# Patient Record
Sex: Female | Born: 1975 | Race: Black or African American | Hispanic: No | Marital: Single | Smoking: Never smoker
Health system: Southern US, Community
[De-identification: ages and names within clinical notes are randomized; demographics above are authoritative.]

## PROBLEM LIST (undated history)

## (undated) DIAGNOSIS — K219 Gastro-esophageal reflux disease without esophagitis: Secondary | ICD-10-CM

## (undated) DIAGNOSIS — Z801 Family history of malignant neoplasm of trachea, bronchus and lung: Secondary | ICD-10-CM

## (undated) DIAGNOSIS — Z9221 Personal history of antineoplastic chemotherapy: Secondary | ICD-10-CM

## (undated) DIAGNOSIS — D649 Anemia, unspecified: Secondary | ICD-10-CM

## (undated) DIAGNOSIS — M1990 Unspecified osteoarthritis, unspecified site: Secondary | ICD-10-CM

## (undated) DIAGNOSIS — R109 Unspecified abdominal pain: Secondary | ICD-10-CM

## (undated) DIAGNOSIS — IMO0001 Reserved for inherently not codable concepts without codable children: Secondary | ICD-10-CM

## (undated) DIAGNOSIS — Z8049 Family history of malignant neoplasm of other genital organs: Secondary | ICD-10-CM

## (undated) DIAGNOSIS — C801 Malignant (primary) neoplasm, unspecified: Secondary | ICD-10-CM

## (undated) DIAGNOSIS — E669 Obesity, unspecified: Secondary | ICD-10-CM

## (undated) DIAGNOSIS — Z87442 Personal history of urinary calculi: Secondary | ICD-10-CM

## (undated) DIAGNOSIS — N200 Calculus of kidney: Secondary | ICD-10-CM

## (undated) DIAGNOSIS — Z923 Personal history of irradiation: Secondary | ICD-10-CM

## (undated) HISTORY — PX: TUBAL LIGATION: SHX77

## (undated) HISTORY — DX: Reserved for inherently not codable concepts without codable children: IMO0001

## (undated) HISTORY — DX: Family history of malignant neoplasm of trachea, bronchus and lung: Z80.1

## (undated) HISTORY — DX: Unspecified abdominal pain: R10.9

## (undated) HISTORY — DX: Family history of malignant neoplasm of other genital organs: Z80.49

## (undated) HISTORY — DX: Obesity, unspecified: E66.9

## (undated) HISTORY — PX: LITHOTRIPSY: SUR834

## (undated) HISTORY — PX: DILATION AND CURETTAGE OF UTERUS: SHX78

## (undated) HISTORY — PX: CHOLECYSTECTOMY: SHX55

---

## 1998-07-06 ENCOUNTER — Emergency Department (HOSPITAL_COMMUNITY): Admission: EM | Admit: 1998-07-06 | Discharge: 1998-07-06 | Payer: Self-pay | Admitting: Emergency Medicine

## 1999-05-04 ENCOUNTER — Emergency Department (HOSPITAL_COMMUNITY): Admission: EM | Admit: 1999-05-04 | Discharge: 1999-05-04 | Payer: Self-pay | Admitting: Emergency Medicine

## 1999-11-05 ENCOUNTER — Emergency Department (HOSPITAL_COMMUNITY): Admission: EM | Admit: 1999-11-05 | Discharge: 1999-11-05 | Payer: Self-pay | Admitting: Emergency Medicine

## 1999-12-26 ENCOUNTER — Emergency Department (HOSPITAL_COMMUNITY): Admission: EM | Admit: 1999-12-26 | Discharge: 1999-12-26 | Payer: Self-pay | Admitting: Emergency Medicine

## 2000-10-27 ENCOUNTER — Encounter: Payer: Self-pay | Admitting: Emergency Medicine

## 2000-10-27 ENCOUNTER — Emergency Department (HOSPITAL_COMMUNITY): Admission: EM | Admit: 2000-10-27 | Discharge: 2000-10-27 | Payer: Self-pay | Admitting: Emergency Medicine

## 2001-04-12 ENCOUNTER — Emergency Department (HOSPITAL_COMMUNITY): Admission: EM | Admit: 2001-04-12 | Discharge: 2001-04-12 | Payer: Self-pay | Admitting: Emergency Medicine

## 2002-03-03 ENCOUNTER — Emergency Department (HOSPITAL_COMMUNITY): Admission: EM | Admit: 2002-03-03 | Discharge: 2002-03-03 | Payer: Self-pay | Admitting: Emergency Medicine

## 2002-03-03 ENCOUNTER — Encounter: Payer: Self-pay | Admitting: Emergency Medicine

## 2003-01-11 ENCOUNTER — Other Ambulatory Visit: Admission: RE | Admit: 2003-01-11 | Discharge: 2003-01-11 | Payer: Self-pay | Admitting: Obstetrics & Gynecology

## 2003-03-24 ENCOUNTER — Emergency Department (HOSPITAL_COMMUNITY): Admission: EM | Admit: 2003-03-24 | Discharge: 2003-03-24 | Payer: Self-pay | Admitting: Emergency Medicine

## 2003-03-24 ENCOUNTER — Encounter: Payer: Self-pay | Admitting: Emergency Medicine

## 2004-01-30 ENCOUNTER — Ambulatory Visit (HOSPITAL_COMMUNITY): Admission: RE | Admit: 2004-01-30 | Discharge: 2004-01-30 | Payer: Self-pay | Admitting: *Deleted

## 2004-01-30 ENCOUNTER — Encounter (INDEPENDENT_AMBULATORY_CARE_PROVIDER_SITE_OTHER): Payer: Self-pay | Admitting: *Deleted

## 2004-08-07 ENCOUNTER — Emergency Department (HOSPITAL_COMMUNITY): Admission: EM | Admit: 2004-08-07 | Discharge: 2004-08-07 | Payer: Self-pay | Admitting: Emergency Medicine

## 2004-11-09 ENCOUNTER — Emergency Department (HOSPITAL_COMMUNITY): Admission: EM | Admit: 2004-11-09 | Discharge: 2004-11-10 | Payer: Self-pay | Admitting: Emergency Medicine

## 2004-11-14 ENCOUNTER — Other Ambulatory Visit: Admission: RE | Admit: 2004-11-14 | Discharge: 2004-11-14 | Payer: Self-pay | Admitting: Obstetrics and Gynecology

## 2006-07-02 ENCOUNTER — Other Ambulatory Visit: Admission: RE | Admit: 2006-07-02 | Discharge: 2006-07-02 | Payer: Self-pay | Admitting: Gynecology

## 2007-06-06 ENCOUNTER — Emergency Department (HOSPITAL_COMMUNITY): Admission: EM | Admit: 2007-06-06 | Discharge: 2007-06-07 | Payer: Self-pay | Admitting: Emergency Medicine

## 2007-06-20 ENCOUNTER — Ambulatory Visit: Payer: Self-pay | Admitting: Gastroenterology

## 2007-07-20 ENCOUNTER — Emergency Department: Payer: Self-pay | Admitting: Emergency Medicine

## 2007-09-27 ENCOUNTER — Emergency Department: Payer: Self-pay | Admitting: Emergency Medicine

## 2008-03-05 ENCOUNTER — Emergency Department (HOSPITAL_COMMUNITY): Admission: EM | Admit: 2008-03-05 | Discharge: 2008-03-05 | Payer: Self-pay | Admitting: Emergency Medicine

## 2008-03-07 ENCOUNTER — Emergency Department: Payer: Self-pay | Admitting: Internal Medicine

## 2008-04-12 ENCOUNTER — Ambulatory Visit: Payer: Self-pay | Admitting: Family Medicine

## 2008-04-19 ENCOUNTER — Ambulatory Visit: Payer: Self-pay | Admitting: General Surgery

## 2008-07-26 ENCOUNTER — Ambulatory Visit: Payer: Self-pay | Admitting: Internal Medicine

## 2008-11-07 ENCOUNTER — Emergency Department: Payer: Self-pay | Admitting: Emergency Medicine

## 2008-11-22 ENCOUNTER — Ambulatory Visit: Payer: Self-pay | Admitting: Internal Medicine

## 2008-12-14 ENCOUNTER — Encounter: Admission: RE | Admit: 2008-12-14 | Discharge: 2008-12-14 | Payer: Self-pay | Admitting: Cardiology

## 2010-01-19 ENCOUNTER — Emergency Department: Payer: Self-pay | Admitting: Emergency Medicine

## 2010-03-14 ENCOUNTER — Ambulatory Visit: Payer: Self-pay | Admitting: Urology

## 2010-03-20 ENCOUNTER — Ambulatory Visit: Payer: Self-pay | Admitting: Urology

## 2011-03-20 NOTE — Op Note (Signed)
[name], Jessica [name]                       ACCOUNT NO.:  [id number]   MEDICAL RECORD NO.:  [id number]                   PATIENT TYPE:  AMB   LOCATION:  ENDO                                 FACILITY:  [facility]   PHYSICIAN:  Georgiana Spinner, M.D.                 DATE OF BIRTH:  December 15, 1975   DATE OF PROCEDURE:  01/30/2004  DATE OF DISCHARGE:                                 OPERATIVE REPORT   PROCEDURE:  Upper endoscopy.   INDICATIONS:  Gastroesophageal reflux disease.   ANESTHESIA:  1. Demerol 90 mg.  2. Versed 9 mg.   DESCRIPTION OF PROCEDURE:  With patient mildly sedated in the left lateral  decubitus position, the Olympus videoscopic endoscope was inserted in the  mouth, passed under direct vision through the esophagus, which appeared  inflamed at its junction with the stomach, and we tried to photograph this.  We did biopsy it.  We entered into the stomach.  Fundus, body, antrum,  duodenal bulb, and second portion of duodenum all appeared normal.  From  this point, the endoscope was slowly withdrawn, taking circumferential views  of the duodenal mucosa until the endoscope was pulled back into the stomach,  placed in retroflexion to view the stomach from below.  The endoscope was  straightened and withdrawn, taking circumferential views of the remaining  gastric and esophageal mucosa.  The patient's vital signs and pulse oximeter  remained stable.  The patient tolerated the procedure well without apparent  complications.   FINDINGS:  1. Changes of esophagitis, biopsied.  Await biopsy report.  2. The patient will call me for results and follow up with me as an     outpatient.  3. Proceed to colonoscopy as planned.                                               Georgiana Spinner, M.D.    GMO/MEDQ  D:  01/30/2004  T:  01/30/2004  Job:  [id number]

## 2011-03-20 NOTE — Op Note (Signed)
NAMECHRISTY, [name]                       ACCOUNT NO.:  [id number]   MEDICAL RECORD NO.:  [id number]                   PATIENT TYPE:  AMB   LOCATION:  ENDO                                 FACILITY:  [facility]   PHYSICIAN:  Georgiana Spinner, M.D.                 DATE OF BIRTH:  10-21-1976   DATE OF PROCEDURE:  DATE OF DISCHARGE:                                 OPERATIVE REPORT   PROCEDURE:  Colonoscopy.   INDICATIONS:  Rectal bleeding.   ANESTHESIA:  Demerol 10 mg, Versed 1mg .   DESCRIPTION OF PROCEDURE:  With the patient mildly sedated and in the left  lateral decubitus position, the Olympus videoscopic colonoscope was inserted  in the rectum and passed under direct vision to the cecum, identified by  ileocecal valve and appendiceal orifice.  We entered into the terminal ileum  which appeared normal and was photographed.  From this point,  the  colonoscope was slowly withdrawn, taking circumferential views of the  colonic mucosa, stopping only in the rectum which appeared normal on direct  and showed hemorrhoids on retroflexed view.  The endoscope was straightened  and withdrawn.  The patient's vital signs and pulse oximetry remained  stable.  The patient tolerated the procedure well without apparent  complications.   FINDINGS:  Internal hemorrhoids, otherwise unremarkable examination.  Etiology of bleeding possibly related to the hemorrhoids.   PLAN:  Have patient follow up with me as described in endoscopy note.                                               Georgiana Spinner, M.D.    GMO/MEDQ  D:  01/30/2004  T:  01/30/2004  Job:  [id number]

## 2011-08-17 LAB — COMPREHENSIVE METABOLIC PANEL
ALT: 20
AST: 20
Albumin: 3.5
Alkaline Phosphatase: 88
BUN: 11
CO2: 27
Calcium: 9
Chloride: 100
Creatinine, Ser: 0.58
GFR calc Af Amer: >60
GFR calc non Af Amer: >60
Glucose, Bld: 110 — ABNORMAL HIGH
Potassium: 3.5
Sodium: 135
Total Bilirubin: 0.3
Total Protein: 6.8

## 2011-08-17 LAB — URINALYSIS, ROUTINE W REFLEX MICROSCOPIC
Bilirubin Urine: NEGATIVE
Glucose, UA: NEGATIVE
Hgb urine dipstick: NEGATIVE
Ketones, ur: NEGATIVE
Nitrite: NEGATIVE
Protein, ur: NEGATIVE
Specific Gravity, Urine: 1.018
Urobilinogen, UA: 0.2
pH: 6

## 2011-08-17 LAB — DIFFERENTIAL
Basophils Absolute: 0.1
Basophils Relative: 1
Eosinophils Absolute: 0.3
Eosinophils Relative: 4
Lymphocytes Relative: 43
Lymphs Abs: 3.9 — ABNORMAL HIGH
Monocytes Absolute: 0.7
Monocytes Relative: 7
Neutro Abs: 4.1
Neutrophils Relative %: 45

## 2011-08-17 LAB — CBC
HCT: 33 — ABNORMAL LOW
Hemoglobin: 11.4 — ABNORMAL LOW
MCHC: 34.4
MCV: 78.9
Platelets: 368
RBC: 4.18
RDW: 14.8 — ABNORMAL HIGH
WBC: 9.1

## 2011-08-17 LAB — LIPASE, BLOOD: Lipase: 36

## 2011-08-17 LAB — PREGNANCY, URINE: Preg Test, Ur: NEGATIVE

## 2013-04-28 ENCOUNTER — Ambulatory Visit: Payer: Self-pay | Admitting: Family Medicine

## 2013-05-24 ENCOUNTER — Ambulatory Visit: Payer: Self-pay

## 2013-05-31 ENCOUNTER — Ambulatory Visit: Payer: Self-pay

## 2013-06-16 ENCOUNTER — Ambulatory Visit: Payer: Self-pay | Admitting: Family Medicine

## 2013-06-16 LAB — URINALYSIS, COMPLETE
Bilirubin,UR: NEGATIVE
Glucose,UR: NEGATIVE mg/dL (ref 0–75)
Ketone: NEGATIVE
Leukocyte Esterase: NEGATIVE
Nitrite: NEGATIVE
Ph: 7.5 (ref 4.5–8.0)
Specific Gravity: 1.015 (ref 1.003–1.030)

## 2013-06-16 LAB — PREGNANCY, URINE: Pregnancy Test, Urine: NEGATIVE m[IU]/mL

## 2013-06-18 LAB — URINE CULTURE

## 2015-01-12 ENCOUNTER — Emergency Department: Payer: Self-pay | Admitting: Emergency Medicine

## 2015-01-25 ENCOUNTER — Ambulatory Visit: Payer: Self-pay

## 2015-01-28 ENCOUNTER — Emergency Department: Payer: Self-pay | Admitting: Emergency Medicine

## 2015-01-28 LAB — URINALYSIS, COMPLETE
Bilirubin,UR: NEGATIVE
Blood: NEGATIVE
Glucose,UR: NEGATIVE mg/dL (ref 0–75)
Ketone: NEGATIVE
Nitrite: NEGATIVE
Ph: 5 (ref 4.5–8.0)
Protein: NEGATIVE
RBC,UR: NONE SEEN /HPF (ref 0–5)
Specific Gravity: 1.026 (ref 1.003–1.030)
Squamous Epithelial: 4
WBC UR: 3 /HPF (ref 0–5)

## 2015-01-28 LAB — CBC
HCT: 35.5 % (ref 35.0–47.0)
HGB: 11.5 g/dL — ABNORMAL LOW (ref 12.0–16.0)
MCH: 26.2 pg (ref 26.0–34.0)
MCHC: 32.5 g/dL (ref 32.0–36.0)
MCV: 81 fL (ref 80–100)
Platelet: 347 10*3/uL (ref 150–440)
RBC: 4.41 10*6/uL (ref 3.80–5.20)
RDW: 15.1 % — ABNORMAL HIGH (ref 11.5–14.5)
WBC: 10.2 10*3/uL (ref 3.6–11.0)

## 2015-01-28 LAB — PREGNANCY, URINE: Pregnancy Test, Urine: NEGATIVE m[IU]/mL

## 2015-01-29 LAB — COMPREHENSIVE METABOLIC PANEL
Albumin: 4.2 g/dL
Alkaline Phosphatase: 96 U/L
Anion Gap: 6 — ABNORMAL LOW (ref 7–16)
BUN: 12 mg/dL
Bilirubin,Total: 0.2 mg/dL — ABNORMAL LOW
Calcium, Total: 8.9 mg/dL
Chloride: 105 mmol/L
Co2: 28 mmol/L
Creatinine: 0.73 mg/dL
EGFR (African American): >60
EGFR (Non-African Amer.): >60
Glucose: 95 mg/dL
Potassium: 3.8 mmol/L
SGOT(AST): 18 U/L
SGPT (ALT): 15 U/L
Sodium: 139 mmol/L
Total Protein: 8 g/dL

## 2015-02-07 ENCOUNTER — Ambulatory Visit
Admit: 2015-02-07 | Disposition: A | Discharge status: Home / Self Care | Payer: Self-pay | Attending: Urology | Admitting: Urology

## 2015-02-15 ENCOUNTER — Ambulatory Visit: Admit: 2015-02-15 | Disposition: A | Discharge status: Home / Self Care | Payer: Self-pay | Admitting: Urology

## 2015-06-17 ENCOUNTER — Other Ambulatory Visit: Payer: Self-pay

## 2015-06-17 DIAGNOSIS — N200 Calculus of kidney: Secondary | ICD-10-CM

## 2015-09-14 ENCOUNTER — Emergency Department: Payer: 59

## 2015-09-14 ENCOUNTER — Encounter: Payer: Self-pay | Admitting: *Deleted

## 2015-09-14 ENCOUNTER — Emergency Department
Admission: EM | Admit: 2015-09-14 | Discharge: 2015-09-14 | Disposition: A | Discharge status: Home / Self Care | Payer: 59 | Attending: Emergency Medicine | Admitting: Emergency Medicine

## 2015-09-14 DIAGNOSIS — M549 Dorsalgia, unspecified: Secondary | ICD-10-CM | POA: Diagnosis not present

## 2015-09-14 DIAGNOSIS — R1032 Left lower quadrant pain: Secondary | ICD-10-CM

## 2015-09-14 DIAGNOSIS — Z3202 Encounter for pregnancy test, result negative: Secondary | ICD-10-CM | POA: Diagnosis not present

## 2015-09-14 HISTORY — DX: Calculus of kidney: N20.0

## 2015-09-14 LAB — COMPREHENSIVE METABOLIC PANEL
ALT: 13 U/L — ABNORMAL LOW (ref 14–54)
AST: 18 U/L (ref 15–41)
Albumin: 3.9 g/dL (ref 3.5–5.0)
Alkaline Phosphatase: 95 U/L (ref 38–126)
Anion gap: 5 (ref 5–15)
BUN: 8 mg/dL (ref 6–20)
CO2: 26 mmol/L (ref 22–32)
Calcium: 8.9 mg/dL (ref 8.9–10.3)
Chloride: 107 mmol/L (ref 101–111)
Creatinine, Ser: 0.64 mg/dL (ref 0.44–1.00)
GFR calc Af Amer: >60 mL/min (ref >60)
GFR calc non Af Amer: >60 mL/min (ref >60)
Glucose, Bld: 110 mg/dL — ABNORMAL HIGH (ref 65–99)
Potassium: 3.9 mmol/L (ref 3.5–5.1)
Sodium: 138 mmol/L (ref 135–145)
Total Bilirubin: 0.5 mg/dL (ref 0.3–1.2)
Total Protein: 7.4 g/dL (ref 6.5–8.1)

## 2015-09-14 LAB — PREGNANCY, URINE: Preg Test, Ur: NEGATIVE

## 2015-09-14 LAB — CBC
HCT: 33.8 % — ABNORMAL LOW (ref 35.0–47.0)
Hemoglobin: 10.8 g/dL — ABNORMAL LOW (ref 12.0–16.0)
MCH: 25.1 pg — ABNORMAL LOW (ref 26.0–34.0)
MCHC: 32.1 g/dL (ref 32.0–36.0)
MCV: 78.1 fL — ABNORMAL LOW (ref 80.0–100.0)
Platelets: 353 10*3/uL (ref 150–440)
RBC: 4.32 MIL/uL (ref 3.80–5.20)
RDW: 15.5 % — ABNORMAL HIGH (ref 11.5–14.5)
WBC: 9.7 10*3/uL (ref 3.6–11.0)

## 2015-09-14 LAB — URINALYSIS COMPLETE WITH MICROSCOPIC (ARMC ONLY)
Bilirubin Urine: NEGATIVE
Glucose, UA: NEGATIVE mg/dL
Hgb urine dipstick: NEGATIVE
Ketones, ur: NEGATIVE mg/dL
Leukocytes, UA: NEGATIVE
Nitrite: NEGATIVE
Protein, ur: NEGATIVE mg/dL
Specific Gravity, Urine: 1.018 (ref 1.005–1.030)
pH: 6 (ref 5.0–8.0)

## 2015-09-14 LAB — WET PREP, GENITAL
Clue Cells Wet Prep HPF POC: NONE SEEN
Trich, Wet Prep: NONE SEEN
Yeast Wet Prep HPF POC: NONE SEEN

## 2015-09-14 LAB — LIPASE, BLOOD: Lipase: 38 U/L (ref 11–51)

## 2015-09-14 NOTE — ED Notes (Signed)
Lower left quadrant intermittent abdominal pain x 1 day.  Denies urinary symptoms.  LBM: today-normal.

## 2015-09-14 NOTE — ED Notes (Signed)
Patient with no complaints at this time. Respirations even and unlabored. Skin warm/dry. Discharge instructions reviewed with patient at this time. Patient given opportunity to voice concerns/ask questions. Patient discharged at this time and left Emergency Department with steady gait.   

## 2015-09-14 NOTE — Discharge Instructions (Signed)

## 2015-09-14 NOTE — ED Provider Notes (Signed)
[facility] Emergency Department Provider Note  ____________________________________________  Time seen: Approximately 3:15 AM  I have reviewed the triage vital signs and the nursing notes.   HISTORY  Chief Complaint Abdominal Pain    HPI Jessica [name] is a 39 y.o. female with a history of kidney stones is presenting with 2 days of right back pain as well as left lower quadrant abdominal pain. She says that the pain started 2 days ago and a sharp. She says that last for several seconds at a time and then goes away. She also says that it is worsened with movement. She denies any dysuria or bleeding in her urine. She also denies any vaginal discharge or bleeding and is not concern for STDs at this time. Denies any nausea or vomiting. Says that her family does have a history of ovarian cysts.   Past Medical History  Diagnosis Date  . Kidney stones     There are no active problems to display for this patient.   Past Surgical History  Procedure Laterality Date  . Cholecystectomy    . Tubal ligation      No current outpatient prescriptions on file.  Allergies Review of patient's allergies indicates no known allergies.  History reviewed. No pertinent family history.  Social History Social History  Substance Use Topics  . Smoking status: Never Smoker   . Smokeless tobacco: None  . Alcohol Use: No    Review of Systems Constitutional: No fever/chills Eyes: No visual changes. ENT: No sore throat. Cardiovascular: Denies chest pain. Respiratory: Denies shortness of breath. Gastrointestinal:   No nausea, no vomiting.  No diarrhea.  No constipation. Genitourinary: Negative for dysuria. Musculoskeletal: Hervey Ard right-sided back pain yesterday that is now resolved. Skin: Negative for rash. Neurological: Negative for headaches, focal weakness or numbness.  10-point ROS otherwise negative.  ____________________________________________   PHYSICAL  EXAM:  VITAL SIGNS: ED Triage Vitals  Enc Vitals Group     BP 09/14/15 0106 116/75 mmHg     Pulse Rate 09/14/15 0106 81     Resp 09/14/15 0106 16     Temp 09/14/15 0106 98.5 F (36.9 C)     Temp Source 09/14/15 0106 Oral     SpO2 09/14/15 0106 99 %     Weight 09/14/15 0106 200 lb (90.719 kg)     Height 09/14/15 0106 5\' 2"  (1.575 m)     Head Cir --      Peak Flow --      Pain Score 09/14/15 0105 10     Pain Loc --      Pain Edu? --      Excl. in [place]? --     Constitutional: Alert and oriented. Well appearing and in no acute distress. Eyes: Conjunctivae are normal. PERRL. EOMI. Head: Atraumatic. Nose: No congestion/rhinnorhea. Mouth/Throat: Mucous membranes are moist.  Oropharynx non-erythematous. Neck: No stridor.   Cardiovascular: Normal rate, regular rhythm. Grossly normal heart sounds.  Good peripheral circulation. Respiratory: Normal respiratory effort.  No retractions. Lungs CTAB. Gastrointestinal: Soft with mild to moderate left lower quadrant abdominal tenderness. There is no rebound or guarding. No distention. No abdominal bruits. No CVA tenderness. Genitourinary: Normal external appearance. Speculum exam with thick white discharge. Bimanual exam with no CMT. No uterine or adnexal tenderness or masses. Left adnexal tenderness which is mild without any masses palpated. Musculoskeletal: No lower extremity tenderness nor edema.  No joint effusions. Neurologic:  Normal speech and language. No gross focal neurologic deficits are  appreciated. No gait instability. Skin:  Skin is warm, dry and intact. No rash noted. Psychiatric: Mood and affect are normal. Speech and behavior are normal.  ____________________________________________   LABS (all labs ordered are listed, but only abnormal results are displayed)  Labs Reviewed  WET PREP, GENITAL - Abnormal; Notable for the following:    WBC, Wet Prep HPF POC FEW (*)    All other components within normal limits  COMPREHENSIVE  METABOLIC PANEL - Abnormal; Notable for the following:    Glucose, Bld 110 (*)    ALT 13 (*)    All other components within normal limits  CBC - Abnormal; Notable for the following:    Hemoglobin 10.8 (*)    HCT 33.8 (*)    MCV 78.1 (*)    MCH 25.1 (*)    RDW 15.5 (*)    All other components within normal limits  URINALYSIS COMPLETEWITH MICROSCOPIC ([facility] ONLY) - Abnormal; Notable for the following:    Color, Urine YELLOW (*)    APPearance CLEAR (*)    Bacteria, UA RARE (*)    Squamous Epithelial / LPF 0-5 (*)    All other components within normal limits  CHLAMYDIA/NGC RT PCR ([facility] ONLY)  LIPASE, BLOOD  PREGNANCY, URINE   ____________________________________________  EKG   ____________________________________________  RADIOLOGY  Negative pelvic ultrasound. Negative study without hydronephrosis of the kidney. ____________________________________________   PROCEDURES    ____________________________________________   INITIAL IMPRESSION / ASSESSMENT AND PLAN / ED COURSE  Pertinent labs & imaging results that were available during my care of the patient were reviewed by me and considered in my medical decision making (see chart for details).  ----------------------------------------- 6:43 AM on 09/14/2015 -----------------------------------------  Patient resting comfortable at this time but still says pain with movement. I discussed with her the labs as well as the imaging and that they did not show a definite cause for her pain. I did offer to work her up further with a CAT scan and other studies with the patient says that she would rather try symptomatic treatment such as Aspercreme and osteotomy ibuprofen. 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 She says that she very likely could've pulled a muscle causing her pain.  I rechecked her pulses and she has bilateral unable radial as well as dorsalis pedis pulses. I  advised her about return precautions to come back to the emergency department for any worsening or concerning symptoms. The patient understands the plan is one to comply. Will follow-up with her primary care doctor in the office otherwise. ____________________________________________   FINAL CLINICAL IMPRESSION(S) / ED DIAGNOSES  Final diagnoses:  Abdominal pain, left lower quadrant  Abdominal pain, left lower quadrant      Orbie Pyo, MD 09/14/15 [phone]

## 2016-02-10 ENCOUNTER — Encounter: Payer: Self-pay | Admitting: *Deleted

## 2016-02-18 ENCOUNTER — Encounter: Payer: Self-pay | Admitting: Urology

## 2016-02-18 ENCOUNTER — Ambulatory Visit: Payer: Self-pay | Admitting: Urology

## 2016-04-27 ENCOUNTER — Other Ambulatory Visit: Payer: Self-pay | Admitting: Family Medicine

## 2016-04-27 DIAGNOSIS — Z1231 Encounter for screening mammogram for malignant neoplasm of breast: Secondary | ICD-10-CM

## 2016-11-03 ENCOUNTER — Encounter: Payer: Self-pay | Admitting: Urology

## 2017-03-06 DIAGNOSIS — M722 Plantar fascial fibromatosis: Secondary | ICD-10-CM | POA: Insufficient documentation

## 2017-03-25 ENCOUNTER — Encounter: Payer: Self-pay | Admitting: *Deleted

## 2017-03-25 ENCOUNTER — Ambulatory Visit
Admission: EM | Admit: 2017-03-25 | Discharge: 2017-03-25 | Disposition: A | Discharge status: Home / Self Care | Payer: 59 | Attending: Emergency Medicine | Admitting: Emergency Medicine

## 2017-03-25 DIAGNOSIS — S161XXA Strain of muscle, fascia and tendon at neck level, initial encounter: Secondary | ICD-10-CM

## 2017-03-25 DIAGNOSIS — M545 Low back pain: Secondary | ICD-10-CM | POA: Diagnosis not present

## 2017-03-25 MED ORDER — MELOXICAM 15 MG PO TABS: 15.0000 mg | ORAL_TABLET | Freq: Every day | ORAL | 1 refills | Status: DC

## 2017-03-25 MED ORDER — KETOROLAC TROMETHAMINE 60 MG/2ML IM SOLN
60.0000 mg | Freq: Once | INTRAMUSCULAR | Status: AC
  Administered 2017-03-25: 60 mg via INTRAMUSCULAR

## 2017-03-25 MED ORDER — TIZANIDINE HCL 4 MG PO TABS: 4.0000 mg | ORAL_TABLET | Freq: Three times a day (TID) | ORAL | 0 refills | Status: DC | PRN

## 2017-03-25 MED ORDER — TRAMADOL HCL 50 MG PO TABS: 50.0000 mg | ORAL_TABLET | Freq: Three times a day (TID) | ORAL | 0 refills | Status: DC | PRN
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
## 2017-03-25 NOTE — Discharge Instructions (Signed)
-  Apply ice to affected areas. -Take medications as prescribed.

## 2017-04-01 DIAGNOSIS — M419 Scoliosis, unspecified: Secondary | ICD-10-CM | POA: Insufficient documentation

## 2017-04-12 ENCOUNTER — Ambulatory Visit
Admission: RE | Admit: 2017-04-12 | Discharge: 2017-04-12 | Disposition: A | Discharge status: Home / Self Care | Payer: 59 | Source: Ambulatory Visit | Attending: Family Medicine | Admitting: Family Medicine

## 2017-04-12 DIAGNOSIS — Z1231 Encounter for screening mammogram for malignant neoplasm of breast: Secondary | ICD-10-CM

## 2017-04-16 ENCOUNTER — Other Ambulatory Visit: Payer: Self-pay | Admitting: Family Medicine

## 2017-04-16 DIAGNOSIS — R928 Other abnormal and inconclusive findings on diagnostic imaging of breast: Secondary | ICD-10-CM

## 2017-04-16 DIAGNOSIS — N6489 Other specified disorders of breast: Secondary | ICD-10-CM

## 2017-04-26 ENCOUNTER — Ambulatory Visit
Admission: RE | Admit: 2017-04-26 | Discharge: 2017-04-26 | Disposition: A | Discharge status: Home / Self Care | Payer: 59 | Source: Ambulatory Visit | Attending: Family Medicine | Admitting: Family Medicine

## 2017-04-26 DIAGNOSIS — N6489 Other specified disorders of breast: Secondary | ICD-10-CM

## 2017-04-26 DIAGNOSIS — R928 Other abnormal and inconclusive findings on diagnostic imaging of breast: Secondary | ICD-10-CM | POA: Diagnosis not present

## 2017-09-28 ENCOUNTER — Ambulatory Visit (INDEPENDENT_AMBULATORY_CARE_PROVIDER_SITE_OTHER): Payer: BLUE CROSS/BLUE SHIELD | Admitting: Urology

## 2017-09-28 ENCOUNTER — Encounter: Payer: Self-pay | Admitting: Urology

## 2017-09-28 VITALS — BP 122/78 | HR 90 | Ht 62.0 in | Wt 209.9 lb

## 2017-09-28 DIAGNOSIS — R3129 Other microscopic hematuria: Secondary | ICD-10-CM

## 2017-09-28 DIAGNOSIS — R109 Unspecified abdominal pain: Secondary | ICD-10-CM | POA: Diagnosis not present

## 2017-09-28 DIAGNOSIS — Z87442 Personal history of urinary calculi: Secondary | ICD-10-CM | POA: Diagnosis not present

## 2017-09-28 LAB — URINALYSIS, COMPLETE
Bilirubin, UA: NEGATIVE
Glucose, UA: NEGATIVE
Ketones, UA: NEGATIVE
Nitrite, UA: NEGATIVE
Protein, UA: NEGATIVE
Specific Gravity, UA: >1.03 — ABNORMAL HIGH (ref 1.005–1.030)
Urobilinogen, Ur: 0.2 mg/dL (ref 0.2–1.0)
pH, UA: 6 (ref 5.0–7.5)

## 2017-09-28 LAB — MICROSCOPIC EXAMINATION

## 2017-09-28 NOTE — Progress Notes (Signed)
09/28/2017 9:37 AM   Jessica [name] 1976/05/19 [id number]  Referring provider: Ricardo Jericho, NP [address] [place], [place] [address]  Chief Complaint  Patient presents with  . Nephrolithiasis    HPI: Patient is a 41 year old African American female who presents today with the complaint of kidney stones and pain in the right side.    She was woken three days ago with ride sided pain, gross hematuria and vomiting.   At this time, she is not experiencing pain.  When the pain hits, it is 8/10.  It is a sharp stabbing pain.  Nothing helps the pain .  Nothing makes it worse.  When she is experiencing pain, it lasts for several minutes at a time.  She is not having fevers or chills.   She is taking Soquel for pain.  She took the powder last night.    Her UA today is positive for 3-10 RBC's.    She has a history of stones.  She has had ESWL in the past.    PMH: Past Medical History:  Diagnosis Date  . Frequency   . Kidney stones   . Obesity   . Right flank pain     Surgical History: Past Surgical History:  Procedure Laterality Date  . CESAREAN SECTION     x 2  . CHOLECYSTECTOMY    . DILATION AND CURETTAGE OF UTERUS    . TUBAL LIGATION      Home Medications:  Allergies as of 09/28/2017   No Known Allergies     Medication List    as of 09/28/2017  9:37 AM   You have not been prescribed any medications.     Allergies: No Known Allergies  Family History: Family History  Problem Relation Age of Onset  . Kidney Stones Unknown   . Cervical cancer Mother   . Breast cancer Neg Hx     Social History:  reports that  has never smoked. she has never used smokeless tobacco. She reports that she does not drink alcohol or use drugs.  ROS: UROLOGY Frequent Urination?: No Hard to postpone urination?: No Burning/pain with urination?: No Get up at night to urinate?: Yes Leakage of urine?: No Urine stream starts and stops?: No Trouble starting  stream?: No Do you have to strain to urinate?: No Blood in urine?: No Urinary tract infection?: No Sexually transmitted disease?: No Injury to kidneys or bladder?: No Painful intercourse?: No Weak stream?: No Currently pregnant?: No Vaginal bleeding?: No Last menstrual period?: n  Gastrointestinal Nausea?: No Vomiting?: Yes Indigestion/heartburn?: No Diarrhea?: No Constipation?: No  Constitutional Fever: No Night sweats?: No Weight loss?: No Fatigue?: No  Skin Skin rash/lesions?: No Itching?: No  Eyes Blurred vision?: No Double vision?: No  Ears/Nose/Throat Sore throat?: No Sinus problems?: No  Hematologic/Lymphatic Swollen glands?: No Easy bruising?: No  Cardiovascular Leg swelling?: No Chest pain?: No  Respiratory Cough?: No Shortness of breath?: No  Endocrine Excessive thirst?: No  Musculoskeletal Back pain?: Yes Joint pain?: No  Neurological Headaches?: Yes Dizziness?: No  Psychologic Depression?: No Anxiety?: No  Physical Exam: BP 122/78 (BP Location: Right Arm, Patient Position: Sitting, Cuff Size: Large)   Pulse 90   Ht 5\' 2"  (1.575 m)   Wt 209 lb 14.4 oz (95.2 kg)   BMI 38.39 kg/m   Constitutional: Well nourished. Alert and oriented, No acute distress. HEENT:  AT, moist mucus membranes. Trachea midline, no masses. Cardiovascular: No clubbing, cyanosis,  or edema. Respiratory: Normal respiratory effort, no increased work of breathing. GI: Abdomen is soft, non tender, non distended, no abdominal masses. Liver and spleen not palpable.  No hernias appreciated.  Stool sample for occult testing is not indicated.   GU: No CVA tenderness.  No bladder fullness or masses.   Skin: No rashes, bruises or suspicious lesions. Lymph: No cervical or inguinal adenopathy. Neurologic: Grossly intact, no focal deficits, moving all 4 extremities. Psychiatric: Normal mood and affect.  Laboratory Data: Urinalysis 3-10 RBC's.  See Epic.   I have  reviewed the labs.    Assessment & Plan:    1. Microscopic hematuria  - I explained to the patient that there are a number of causes that can be associated with blood in the urine, such as stones, UTI's, damage to the urinary tract and/or cancer.  - At this time, I felt that the patient warranted further urologic evaluation.   The AUA guidelines state that a CT urogram is the preferred imaging study to evaluate hematuria.  - I explained to the patient that a contrast material will be injected into a vein and that in rare instances, an allergic reaction can result and may even life threatening   The patient denies any allergies to contrast, iodine and/or seafood and is not taking metformin.  - Her reproductive status unknown at this time.  We will obtain a serum pregnancy test today.   - Following the imaging study,  I've recommended a cystoscopy. I described how this is performed, typically in an office setting with a flexible cystoscope. We described the risks, benefits, and possible side effects, the most common of which is a minor amount of blood in the urine and/or burning which usually resolves in 24 to 48 hours.    - The patient had the opportunity to ask questions which were answered. Based upon this discussion, the patient is willing to proceed. Therefore, I've ordered: a CT Urogram and cystoscopy.  - The patient will return following all of the above for discussion of the results.   - UA  - Urine culture  - BUN + creatinine   - serum pregnancy test  2. History of stones  - see above  3. Right flank pain  - see above  - Urinalysis, Complete  Return for CT Urogram report and cystoscopy.  These notes generated with voice recognition software. I apologize for typographical errors.  Zara Council, [facility] [address] [place], [place] [address] [phone]

## 2017-09-29 LAB — BUN+CREAT
BUN/Creatinine Ratio: 16 (ref 9–23)
BUN: 13 mg/dL (ref 6–24)
Creatinine, Ser: 0.79 mg/dL (ref 0.57–1.00)
GFR calc Af Amer: 106 mL/min/{1.73_m2} (ref >59)
GFR calc non Af Amer: 92 mL/min/{1.73_m2} (ref >59)

## 2017-09-29 LAB — HCG, SERUM, QUALITATIVE: hCG,Beta Subunit,Qual,Serum: NEGATIVE m[IU]/mL (ref <6)

## 2017-09-30 ENCOUNTER — Ambulatory Visit: Payer: BLUE CROSS/BLUE SHIELD

## 2017-09-30 LAB — CULTURE, URINE COMPREHENSIVE

## 2017-10-01 ENCOUNTER — Other Ambulatory Visit: Payer: BLUE CROSS/BLUE SHIELD | Admitting: Urology

## 2017-10-01 ENCOUNTER — Encounter: Payer: Self-pay | Admitting: Urology

## 2018-04-12 ENCOUNTER — Encounter: Payer: Self-pay | Admitting: Urology

## 2018-04-18 DIAGNOSIS — M545 Low back pain, unspecified: Secondary | ICD-10-CM | POA: Insufficient documentation

## 2018-04-18 DIAGNOSIS — G8929 Other chronic pain: Secondary | ICD-10-CM | POA: Insufficient documentation

## 2018-04-18 DIAGNOSIS — M47816 Spondylosis without myelopathy or radiculopathy, lumbar region: Secondary | ICD-10-CM | POA: Insufficient documentation

## 2018-11-02 HISTORY — PX: BREAST BIOPSY: SHX20

## 2019-07-05 ENCOUNTER — Other Ambulatory Visit: Payer: Self-pay | Admitting: Family Medicine

## 2019-07-07 ENCOUNTER — Other Ambulatory Visit: Payer: Self-pay | Admitting: Family Medicine

## 2019-07-07 DIAGNOSIS — N6310 Unspecified lump in the right breast, unspecified quadrant: Secondary | ICD-10-CM

## 2019-07-20 ENCOUNTER — Ambulatory Visit
Admission: RE | Admit: 2019-07-20 | Discharge: 2019-07-20 | Disposition: A | Discharge status: Home / Self Care | Payer: BC Managed Care – PPO | Source: Ambulatory Visit | Attending: Family Medicine | Admitting: Family Medicine

## 2019-07-20 DIAGNOSIS — N6310 Unspecified lump in the right breast, unspecified quadrant: Secondary | ICD-10-CM

## 2019-07-21 ENCOUNTER — Other Ambulatory Visit: Payer: Self-pay | Admitting: Family Medicine

## 2019-07-21 DIAGNOSIS — R928 Other abnormal and inconclusive findings on diagnostic imaging of breast: Secondary | ICD-10-CM

## 2019-07-21 DIAGNOSIS — N6310 Unspecified lump in the right breast, unspecified quadrant: Secondary | ICD-10-CM

## 2019-07-23 DIAGNOSIS — C50919 Malignant neoplasm of unspecified site of unspecified female breast: Secondary | ICD-10-CM

## 2019-07-23 HISTORY — DX: Malignant neoplasm of unspecified site of unspecified female breast: C50.919

## 2019-07-24 NOTE — Progress Notes (Signed)
07/25/2019 10:24 AM   Jessica [name] 09-Oct-1976 [id number]  Referring provider: Ricardo Jericho, NP [address] [place],  [place] [address]  Chief Complaint  Patient presents with  . Nephrolithiasis    HPI: Patient is a 43 year old female who presents today with the complaint of LBP and hematuria.  She states that on the night of 06/21/2019 she was woken up by left sided lower back pain.  It is radiating down her left buttocks and into the back of her left thigh.  She describes the pain as throbbing.  Pain 10/10.  The pain is lasting 15 to 20 seconds through out the day.  She states that when she sits, put pressures on the left side or lays on her left side.  She states that standing helps the pain.  Patient denies any gross hematuria, dysuria or suprapubic/flank pain.  Patient denies any fevers, chills, nausea or vomiting.    Her UA is negative.  Hematuria based on positive dip at PCP not seen on subsequent microscopic exams.    She has a history of stones.  She has had ESWL in the past.    PMH: Past Medical History:  Diagnosis Date  . Frequency   . Kidney stones   . Obesity   . Right flank pain     Surgical History: Past Surgical History:  Procedure Laterality Date  . CESAREAN SECTION     x 2  . CHOLECYSTECTOMY    . DILATION AND CURETTAGE OF UTERUS    . TUBAL LIGATION      Home Medications:  Allergies as of 07/25/2019   No Known Allergies     Medication List       Accurate as of July 25, 2019 10:24 AM. If you have any questions, ask your nurse or doctor.        HYDROcodone-acetaminophen 5-325 MG tablet Commonly known as: NORCO/VICODIN   tamsulosin 0.4 MG Caps capsule Commonly known as: FLOMAX Take by mouth.   Xulane 150-35 MCG/24HR transdermal patch Generic drug: norelgestromin-ethinyl estradiol PLACE 1 PATCH ONTO THE SKIN ONCE A WEEK X 3 WEEKS, THEN ONE PATCH-FREE WEEK       Allergies: No Known Allergies  Family History:  Family History  Problem Relation Age of Onset  . Kidney Stones Other   . Cervical cancer Mother   . Breast cancer Neg Hx     Social History:  reports that she has never smoked. She has never used smokeless tobacco. She reports that she does not drink alcohol or use drugs.  ROS: UROLOGY Frequent Urination?: No Hard to postpone urination?: No Burning/pain with urination?: No Get up at night to urinate?: No Leakage of urine?: No Urine stream starts and stops?: No Trouble starting stream?: No Do you have to strain to urinate?: No Blood in urine?: No Urinary tract infection?: No Sexually transmitted disease?: No Injury to kidneys or bladder?: No Painful intercourse?: No Weak stream?: No Currently pregnant?: No Vaginal bleeding?: No Last menstrual period?: n  Gastrointestinal Nausea?: No Vomiting?: No Indigestion/heartburn?: No Diarrhea?: No Constipation?: No  Constitutional Fever: No Night sweats?: No Weight loss?: No Fatigue?: No  Skin Skin rash/lesions?: No Itching?: No  Eyes Blurred vision?: No Double vision?: No  Ears/Nose/Throat Sore throat?: No Sinus problems?: No  Hematologic/Lymphatic Swollen glands?: No Easy bruising?: No  Cardiovascular Leg swelling?: No Chest pain?: No  Respiratory Cough?: No Shortness of breath?: No  Endocrine Excessive thirst?: No  Musculoskeletal Back pain?:  No Joint pain?: No  Neurological Headaches?: No Dizziness?: No  Psychologic Depression?: No Anxiety?: No  Physical Exam: BP 123/81 (BP Location: Left Arm, Patient Position: Sitting, Cuff Size: Normal)   Pulse 84   Ht 5\' 2"  (1.575 m)   Wt 219 lb (99.3 kg)   LMP 07/09/2019   BMI 40.06 kg/m   Constitutional:  Well nourished. Alert and oriented, No acute distress. HEENT: [place] AT, moist mucus membranes.  Trachea midline, no masses. Cardiovascular: No clubbing, cyanosis, or edema. Respiratory: Normal respiratory effort, no increased work of breathing.  GI: Abdomen is soft, non tender, non distended, no abdominal masses. Liver and spleen not palpable.  No hernias appreciated.  Stool sample for occult testing is not indicated.   GU: No CVA tenderness.  No bladder fullness or masses.   Skin: No rashes, bruises or suspicious lesions. Lymph: No cervical or inguinal adenopathy. Neurologic: Grossly intact, no focal deficits, moving all 4 extremities.  Tender to palpation in the left lower back.   Psychiatric: Normal mood and affect.   Laboratory Data: Urinalysis Component     Latest Ref Rng & Units 07/25/2019  Specific Gravity, UA     1.005 - 1.030 1.025  pH, UA     5.0 - 7.5 5.5  Color, UA     Yellow Yellow  Appearance Ur     Clear Cloudy (A)  Leukocytes,UA     Negative Negative  Protein,UA     Negative/Trace Negative  Glucose, UA     Negative Negative  Ketones, UA     Negative Negative  RBC, UA     Negative 1+ (A)  Bilirubin, UA     Negative Negative  Urobilinogen, Ur     0.2 - 1.0 mg/dL 0.2  Nitrite, UA     Negative Negative  Microscopic Examination      See below:   Component     Latest Ref Rng & Units 07/25/2019  WBC, UA     0 - 5 /hpf 0-5  RBC     0 - 2 /hpf None seen  Epithelial Cells (non renal)     0 - 10 /hpf >10 (A)  Crystals     N/A Present (A)  Crystal Type     N/A Amorphous Sediment  Mucus, UA     Not Estab. Present (A)  Bacteria, UA     None seen/Few Few   I have reviewed the labs.  Pertinent Imaging CLINICAL DATA:  Flank pain  EXAM: ABDOMEN - 1 VIEW  COMPARISON:  None.  FINDINGS: The bowel gas pattern is normal. Surgical clips seen in the right upper quadrant. Moderate amount of colonic stool. No radio-opaque calculi or other significant radiographic abnormality are seen.  IMPRESSION: No calculi seen overlying the renal shadows.   Electronically Signed   By: Prudencio Pair M.D.   On: 07/25/2019 14:16 I have independently reviewed the films and did not appreciate any renal or  ureteral stones.   Assessment & Plan:    1. Left lower back pain Likely MSK as her UA and KUB are negative but with history of nephrolithiasis I offered CT Renal stone study for further evaluation and patient would like to proceed Patient is advised that if they should start to experience pain that is not able to be controlled with pain medication, intractable nausea and/or vomiting and/or fevers greater than 103 or shaking chills to contact the office immediately or seek treatment in the emergency department for emergent intervention.  2. History of stones  - see above    Return for I will call patient with results.  These notes generated with voice recognition software. I apologize for typographical errors.  Zara Council, PA-C  [facility] [address] [place], [place] [address] [phone]

## 2019-07-25 ENCOUNTER — Ambulatory Visit
Admission: RE | Admit: 2019-07-25 | Discharge: 2019-07-25 | Disposition: A | Discharge status: Home / Self Care | Payer: BC Managed Care – PPO | Source: Ambulatory Visit | Attending: Urology | Admitting: Urology

## 2019-07-25 ENCOUNTER — Other Ambulatory Visit: Payer: Self-pay

## 2019-07-25 ENCOUNTER — Encounter: Payer: Self-pay | Admitting: Urology

## 2019-07-25 ENCOUNTER — Ambulatory Visit (INDEPENDENT_AMBULATORY_CARE_PROVIDER_SITE_OTHER): Payer: BC Managed Care – PPO | Admitting: Urology

## 2019-07-25 VITALS — BP 123/81 | HR 84 | Ht 62.0 in | Wt 219.0 lb

## 2019-07-25 DIAGNOSIS — Z87448 Personal history of other diseases of urinary system: Secondary | ICD-10-CM

## 2019-07-25 DIAGNOSIS — Z87442 Personal history of urinary calculi: Secondary | ICD-10-CM

## 2019-07-25 DIAGNOSIS — M545 Low back pain, unspecified: Secondary | ICD-10-CM

## 2019-07-25 LAB — URINALYSIS, COMPLETE
Bilirubin, UA: NEGATIVE
Glucose, UA: NEGATIVE
Ketones, UA: NEGATIVE
Leukocytes,UA: NEGATIVE
Nitrite, UA: NEGATIVE
Protein,UA: NEGATIVE
Specific Gravity, UA: 1.025 (ref 1.005–1.030)
Urobilinogen, Ur: 0.2 mg/dL (ref 0.2–1.0)
pH, UA: 5.5 (ref 5.0–7.5)

## 2019-07-25 LAB — MICROSCOPIC EXAMINATION
Epithelial Cells (non renal): >10 /hpf — AB (ref 0–10)
RBC, Urine: NONE SEEN /hpf (ref 0–2)

## 2019-07-26 ENCOUNTER — Ambulatory Visit
Admission: RE | Admit: 2019-07-26 | Discharge: 2019-07-26 | Disposition: A | Discharge status: Home / Self Care | Payer: BC Managed Care – PPO | Source: Ambulatory Visit | Attending: Family Medicine | Admitting: Family Medicine

## 2019-07-26 DIAGNOSIS — N6310 Unspecified lump in the right breast, unspecified quadrant: Secondary | ICD-10-CM

## 2019-07-26 DIAGNOSIS — R928 Other abnormal and inconclusive findings on diagnostic imaging of breast: Secondary | ICD-10-CM

## 2019-07-26 LAB — BUN+CREAT
BUN/Creatinine Ratio: 8 — ABNORMAL LOW (ref 9–23)
BUN: 6 mg/dL (ref 6–24)
Creatinine, Ser: 0.79 mg/dL (ref 0.57–1.00)
GFR calc Af Amer: 105 mL/min/{1.73_m2} (ref >59)
GFR calc non Af Amer: 91 mL/min/{1.73_m2} (ref >59)

## 2019-07-26 LAB — HCG, SERUM, QUALITATIVE: hCG,Beta Subunit,Qual,Serum: NEGATIVE m[IU]/mL (ref <6)

## 2019-07-28 ENCOUNTER — Other Ambulatory Visit: Payer: Self-pay | Admitting: Oncology

## 2019-07-28 LAB — CULTURE, URINE COMPREHENSIVE

## 2019-07-31 ENCOUNTER — Other Ambulatory Visit: Payer: Self-pay

## 2019-07-31 ENCOUNTER — Telehealth: Payer: Self-pay | Admitting: Urology

## 2019-07-31 DIAGNOSIS — C773 Secondary and unspecified malignant neoplasm of axilla and upper limb lymph nodes: Secondary | ICD-10-CM | POA: Insufficient documentation

## 2019-07-31 DIAGNOSIS — C50919 Malignant neoplasm of unspecified site of unspecified female breast: Secondary | ICD-10-CM

## 2019-07-31 DIAGNOSIS — C50911 Malignant neoplasm of unspecified site of right female breast: Secondary | ICD-10-CM | POA: Insufficient documentation

## 2019-07-31 LAB — SURGICAL PATHOLOGY

## 2019-07-31 NOTE — Telephone Encounter (Signed)
FYI Per Manuela Schwartz in scheduling patient has declined to schedule her CT scan you ordered   Sharyn Lull

## 2019-08-02 ENCOUNTER — Other Ambulatory Visit: Payer: Self-pay

## 2019-08-02 NOTE — Progress Notes (Signed)
  Oncology Nurse Navigator Documentation  Navigator Location: [facility] (08/02/19 1000) Referral Date to RadOnc/MedOnc: 08/03/19 (08/02/19 1000) )Navigator Encounter Type: Introductory Phone Call (08/02/19 1000)   Abnormal Finding Date: 07/20/19 (08/02/19 1000) Confirmed Diagnosis Date: 07/26/19 (08/02/19 1000)               Patient Visit Type: Initial (08/02/19 1000)   Barriers/Navigation Needs: Anxiety;Coordination of Care;Education (08/02/19 1000)                          Time Spent with Patient: 60 (08/02/19 1000)   Introduced patient to navigation/ psychosocial support.  Scheduled with Med/ Onc, and surgery. Breast Cancer Treatment Handbook delivered to Dr. Ines Bloomer office

## 2019-08-03 ENCOUNTER — Other Ambulatory Visit: Payer: Self-pay

## 2019-08-03 ENCOUNTER — Inpatient Hospital Stay: Payer: BC Managed Care – PPO

## 2019-08-03 ENCOUNTER — Encounter: Payer: Self-pay | Admitting: Oncology

## 2019-08-03 ENCOUNTER — Inpatient Hospital Stay: Payer: BC Managed Care – PPO | Attending: Oncology | Admitting: Oncology

## 2019-08-03 VITALS — BP 127/84 | HR 84 | Temp 96.9°F | Resp 16 | Ht 63.0 in | Wt 218.8 lb

## 2019-08-03 DIAGNOSIS — C50311 Malignant neoplasm of lower-inner quadrant of right female breast: Secondary | ICD-10-CM | POA: Insufficient documentation

## 2019-08-03 DIAGNOSIS — C50911 Malignant neoplasm of unspecified site of right female breast: Secondary | ICD-10-CM | POA: Diagnosis not present

## 2019-08-03 DIAGNOSIS — Z170 Estrogen receptor positive status [ER+]: Secondary | ICD-10-CM | POA: Diagnosis not present

## 2019-08-03 DIAGNOSIS — C773 Secondary and unspecified malignant neoplasm of axilla and upper limb lymph nodes: Secondary | ICD-10-CM | POA: Insufficient documentation

## 2019-08-03 DIAGNOSIS — Z7982 Long term (current) use of aspirin: Secondary | ICD-10-CM | POA: Diagnosis not present

## 2019-08-03 DIAGNOSIS — R110 Nausea: Secondary | ICD-10-CM | POA: Diagnosis not present

## 2019-08-03 DIAGNOSIS — Z7189 Other specified counseling: Secondary | ICD-10-CM | POA: Diagnosis not present

## 2019-08-03 DIAGNOSIS — N200 Calculus of kidney: Secondary | ICD-10-CM | POA: Diagnosis not present

## 2019-08-03 DIAGNOSIS — C50919 Malignant neoplasm of unspecified site of unspecified female breast: Secondary | ICD-10-CM

## 2019-08-03 DIAGNOSIS — R5381 Other malaise: Secondary | ICD-10-CM | POA: Diagnosis not present

## 2019-08-03 DIAGNOSIS — Z791 Long term (current) use of non-steroidal anti-inflammatories (NSAID): Secondary | ICD-10-CM | POA: Insufficient documentation

## 2019-08-03 DIAGNOSIS — E611 Iron deficiency: Secondary | ICD-10-CM

## 2019-08-03 DIAGNOSIS — K219 Gastro-esophageal reflux disease without esophagitis: Secondary | ICD-10-CM | POA: Diagnosis not present

## 2019-08-03 DIAGNOSIS — R0609 Other forms of dyspnea: Secondary | ICD-10-CM | POA: Insufficient documentation

## 2019-08-03 DIAGNOSIS — Z79899 Other long term (current) drug therapy: Secondary | ICD-10-CM | POA: Insufficient documentation

## 2019-08-03 DIAGNOSIS — R5383 Other fatigue: Secondary | ICD-10-CM | POA: Insufficient documentation

## 2019-08-03 DIAGNOSIS — K30 Functional dyspepsia: Secondary | ICD-10-CM | POA: Diagnosis not present

## 2019-08-03 DIAGNOSIS — Z87442 Personal history of urinary calculi: Secondary | ICD-10-CM | POA: Diagnosis not present

## 2019-08-03 DIAGNOSIS — Z8639 Personal history of other endocrine, nutritional and metabolic disease: Secondary | ICD-10-CM

## 2019-08-03 LAB — COMPREHENSIVE METABOLIC PANEL
ALT: 11 U/L (ref 0–44)
AST: 16 U/L (ref 15–41)
Albumin: 3.7 g/dL (ref 3.5–5.0)
Alkaline Phosphatase: 61 U/L (ref 38–126)
Anion gap: 8 (ref 5–15)
BUN: 10 mg/dL (ref 6–20)
CO2: 23 mmol/L (ref 22–32)
Calcium: 8.9 mg/dL (ref 8.9–10.3)
Chloride: 105 mmol/L (ref 98–111)
Creatinine, Ser: 0.67 mg/dL (ref 0.44–1.00)
GFR calc Af Amer: >60 mL/min (ref >60)
GFR calc non Af Amer: >60 mL/min (ref >60)
Glucose, Bld: 93 mg/dL (ref 70–99)
Potassium: 3.9 mmol/L (ref 3.5–5.1)
Sodium: 136 mmol/L (ref 135–145)
Total Bilirubin: 0.3 mg/dL (ref 0.3–1.2)
Total Protein: 8 g/dL (ref 6.5–8.1)

## 2019-08-03 LAB — IRON AND TIBC
Iron: 43 ug/dL (ref 28–170)
Saturation Ratios: 8 % — ABNORMAL LOW (ref 10.4–31.8)
TIBC: 520 ug/dL — ABNORMAL HIGH (ref 250–450)
UIBC: 477 ug/dL

## 2019-08-03 LAB — CBC WITH DIFFERENTIAL/PLATELET
Abs Immature Granulocytes: 0.02 10*3/uL (ref 0.00–0.07)
Basophils Absolute: 0 10*3/uL (ref 0.0–0.1)
Basophils Relative: 0 %
Eosinophils Absolute: 0.2 10*3/uL (ref 0.0–0.5)
Eosinophils Relative: 2 %
HCT: 36.8 % (ref 36.0–46.0)
Hemoglobin: 12 g/dL (ref 12.0–15.0)
Immature Granulocytes: 0 %
Lymphocytes Relative: 37 %
Lymphs Abs: 3.8 10*3/uL (ref 0.7–4.0)
MCH: 28.1 pg (ref 26.0–34.0)
MCHC: 32.6 g/dL (ref 30.0–36.0)
MCV: 86.2 fL (ref 80.0–100.0)
Monocytes Absolute: 0.6 10*3/uL (ref 0.1–1.0)
Monocytes Relative: 5 %
Neutro Abs: 5.5 10*3/uL (ref 1.7–7.7)
Neutrophils Relative %: 56 %
Platelets: 375 10*3/uL (ref 150–400)
RBC: 4.27 MIL/uL (ref 3.87–5.11)
RDW: 13.8 % (ref 11.5–15.5)
WBC: 10.2 10*3/uL (ref 4.0–10.5)
nRBC: 0 % (ref 0.0–0.2)

## 2019-08-03 LAB — FERRITIN: Ferritin: 7 ng/mL — ABNORMAL LOW (ref 11–307)

## 2019-08-03 NOTE — Progress Notes (Signed)
Pt new breast cancer. She is having some breast pain from bx on right side

## 2019-08-03 NOTE — Research (Signed)
I met with patient today after her appointment with Dr. Tasia Catchings to discuss the research study "Procurement of Human Biospecimens for the Discovery and Validation of Biomarkers for the Predication, Diagnosis and Management of Disease" sponsored by [employer].  Dr. Tasia Catchings recommended the study.  I explained the study to patient including purpose of the study, risks/benefits, participation requirements, voluntary participation and reimbursement provided by the study.  Patient agreeable to participation and agreed to have blood for study drawn at her already scheduled labs today.  I met patient in the lab and collected blood vials for the study and gave patient gift card provided by [facility]. Blood sent to [facility] today. I thanked patient for her participation. Lula Olszewski Clinical oncology research associate 08/03/2019

## 2019-08-04 ENCOUNTER — Encounter: Payer: Self-pay | Admitting: Oncology

## 2019-08-04 DIAGNOSIS — M7072 Other bursitis of hip, left hip: Secondary | ICD-10-CM | POA: Insufficient documentation

## 2019-08-04 DIAGNOSIS — M5430 Sciatica, unspecified side: Secondary | ICD-10-CM | POA: Insufficient documentation

## 2019-08-04 LAB — CANCER ANTIGEN 15-3: CA 15-3: 32.3 U/mL — ABNORMAL HIGH (ref 0.0–25.0)

## 2019-08-04 LAB — CANCER ANTIGEN 27.29: CA 27.29: 45.7 U/mL — ABNORMAL HIGH (ref 0.0–38.6)

## 2019-08-05 ENCOUNTER — Encounter: Payer: Self-pay | Admitting: Oncology

## 2019-08-05 DIAGNOSIS — Z170 Estrogen receptor positive status [ER+]: Secondary | ICD-10-CM | POA: Insufficient documentation

## 2019-08-05 DIAGNOSIS — C50311 Malignant neoplasm of lower-inner quadrant of right female breast: Secondary | ICD-10-CM | POA: Insufficient documentation

## 2019-08-05 MED ORDER — ONDANSETRON HCL 8 MG PO TABS: 8.0000 mg | ORAL_TABLET | Freq: Two times a day (BID) | ORAL | 1 refills | Status: DC | PRN

## 2019-08-05 MED ORDER — PROCHLORPERAZINE MALEATE 10 MG PO TABS: 10.0000 mg | ORAL_TABLET | Freq: Four times a day (QID) | ORAL | 1 refills | Status: DC | PRN

## 2019-08-05 MED ORDER — LIDOCAINE-PRILOCAINE 2.5-2.5 % EX CREA: TOPICAL_CREAM | CUTANEOUS | 3 refills | Status: DC

## 2019-08-05 MED ORDER — FERROUS SULFATE 325 (65 FE) MG PO TBEC: 325.0000 mg | DELAYED_RELEASE_TABLET | Freq: Two times a day (BID) | ORAL | 1 refills | Status: DC

## 2019-08-05 MED ORDER — DEXAMETHASONE 4 MG PO TABS: ORAL_TABLET | ORAL | 1 refills | Status: DC

## 2019-08-05 NOTE — Progress Notes (Signed)
START ON PATHWAY REGIMEN - Breast   Dose-Dense AC q14 days:   A cycle is every 14 days:     Doxorubicin      Cyclophosphamide      Pegfilgrastim-xxxx   **Always confirm dose/schedule in your pharmacy ordering system**  Paclitaxel 80 mg/m2 Weekly:   Administer weekly:     Paclitaxel   **Always confirm dose/schedule in your pharmacy ordering system**  Patient Characteristics: Preoperative or Nonsurgical Candidate (Clinical Staging), Neoadjuvant Therapy followed by Surgery, Invasive Disease, Chemotherapy, HER2 Negative/Unknown/Equivocal, ER Positive Therapeutic Status: Preoperative or Nonsurgical Candidate (Clinical Staging) AJCC M Category: cM0 AJCC Grade: G3 Breast Surgical Plan: Neoadjuvant Therapy followed by Surgery ER Status: Positive (+) AJCC 8 Stage Grouping: IIB HER2 Status: Negative (-) AJCC T Category: cT2 AJCC N Category: cN1 PR Status: Positive (+) Intent of Therapy: Curative Intent, Discussed with Patient 

## 2019-08-05 NOTE — Progress Notes (Signed)
Hematology/Oncology Consult note [facility] Telephone:([phone] [phone]   Patient Care Team: Ricardo Jericho, NP as PCP - General (Family Medicine)  REFERRING PROVIDER:  CHIEF COMPLAINTS/REASON FOR VISIT:  Evaluation of breast cancer  HISTORY OF PRESENTING ILLNESS:  Jessica [name] is a  43 y.o.  female with PMH listed below who was referred to me for evaluation of breast cancer Patient felt right breast mass. Patient had mammogram and ultrasound on 07/20/2019 which showed suspicious palpable right breast mass, 3.4 x 2.5 x 3.4 cm.  Suspicious cortically thickened right axillary lymph node. Biopsy pathology showed: Right breast mass biopsy positive for invasive mammary carcinoma, no special type.  Grade 3, DCIS not identified, lymphovascular invasion not identified.  Right axillary lymph node positive for metastatic carcinoma. ER+, PR+, HER-2-  Nipple discharge: Denies Family history: Mother with cervical cancer. OCP use: Patient uses Xulane patch  Estrogen and progesterone therapy: denies History of radiation to chest: denies.  Previous breast surgery: Denies Premenopausal status History of tubal ligation. She reports a history of abnormal uterine bleeding, anemia, history of blood transfusion.  GERD.  Kidney stone.   Review of Systems  Constitutional: Positive for fatigue. Negative for appetite change, chills and fever.  HENT:   Negative for hearing loss and voice change.   Eyes: Negative for eye problems.  Respiratory: Negative for chest tightness and cough.   Cardiovascular: Negative for chest pain.  Gastrointestinal: Negative for abdominal distention, abdominal pain and blood in stool.  Endocrine: Negative for hot flashes.  Genitourinary: Negative for difficulty urinating and frequency.   Musculoskeletal: Negative for arthralgias.  Skin: Negative for itching and rash.  Neurological: Negative for extremity weakness.   Hematological: Negative for adenopathy.  Psychiatric/Behavioral: Negative for confusion. The patient is nervous/anxious.     MEDICAL HISTORY:  Past Medical History:  Diagnosis Date  . Frequency   . Kidney stones   . Obesity   . Right flank pain     SURGICAL HISTORY: Past Surgical History:  Procedure Laterality Date  . CESAREAN SECTION     x 2  . CHOLECYSTECTOMY    . DILATION AND CURETTAGE OF UTERUS    . TUBAL LIGATION      SOCIAL HISTORY: Social History   Socioeconomic History  . Marital status: Single    Spouse name: Not on file  . Number of children: Not on file  . Years of education: Not on file  . Highest education level: Not on file  Occupational History  . Not on file  Social Needs  . Financial resource strain: Not on file  . Food insecurity    Worry: Not on file    Inability: Not on file  . Transportation needs    Medical: Not on file    Non-medical: Not on file  Tobacco Use  . Smoking status: Never Smoker  . Smokeless tobacco: Never Used  Substance and Sexual Activity  . Alcohol use: No  . Drug use: No  . Sexual activity: Yes  Lifestyle  . Physical activity    Days per week: Not on file    Minutes per session: Not on file  . Stress: Not on file  Relationships  . Social Herbalist on phone: Not on file    Gets together: Not on file    Attends religious service: Not on file    Active member of club or organization: Not on file    Attends meetings of clubs or  organizations: Not on file    Relationship status: Not on file  . Intimate partner violence    Fear of current or ex partner: Not on file    Emotionally abused: Not on file    Physically abused: Not on file    Forced sexual activity: Not on file  Other Topics Concern  . Not on file  Social History Narrative  . Not on file    FAMILY HISTORY: Family History  Problem Relation Age of Onset  . Kidney Stones Other   . Cervical cancer Mother   . Kidney Stones Mother   .  Other Sister   . Breast cancer Neg Hx     ALLERGIES:  has No Known Allergies.  MEDICATIONS:  Current Outpatient Medications  Medication Sig Dispense Refill  . gabapentin (NEURONTIN) 300 MG capsule Take 300 mg by mouth 2 (two) times daily.    . norelgestromin-ethinyl estradiol Marilu Favre) 150-35 MCG/24HR transdermal patch PLACE 1 PATCH ONTO THE SKIN ONCE A WEEK X 3 WEEKS, THEN ONE PATCH-FREE WEEK    . HYDROcodone-acetaminophen (NORCO/VICODIN) 5-325 MG tablet 1 tablet every 6 (six) hours as needed.     . tamsulosin (FLOMAX) 0.4 MG CAPS capsule Take by mouth.     No current facility-administered medications for this visit.      PHYSICAL EXAMINATION: ECOG PERFORMANCE STATUS: 0 - Asymptomatic Vitals:   08/03/19 1106  BP: 127/84  Pulse: 84  Resp: 16  Temp: (!) 96.9 F (36.1 C)   Filed Weights   08/03/19 1106  Weight: 218 lb 12.8 oz (99.2 kg)    Physical Exam Constitutional:      General: She is not in acute distress. HENT:     Head: Normocephalic and atraumatic.  Eyes:     General: No scleral icterus.    Pupils: Pupils are equal, round, and reactive to light.  Neck:     Musculoskeletal: Normal range of motion and neck supple.  Cardiovascular:     Rate and Rhythm: Normal rate and regular rhythm.     Heart sounds: Normal heart sounds.  Pulmonary:     Effort: Pulmonary effort is normal. No respiratory distress.     Breath sounds: No wheezing.  Abdominal:     General: Bowel sounds are normal. There is no distension.     Palpations: Abdomen is soft. There is no mass.     Tenderness: There is no abdominal tenderness.  Musculoskeletal: Normal range of motion.        General: No deformity.  Skin:    General: Skin is warm and dry.     Findings: No erythema or rash.  Neurological:     Mental Status: She is alert and oriented to person, place, and time.     Cranial Nerves: No cranial nerve deficit.     Coordination: Coordination normal.  Psychiatric:        Behavior:  Behavior normal.        Thought Content: Thought content normal.   Breast exam was performed in seated and lying down position. Palpable right lower inner quadrant mass.  I cannot appreciate right axillary lymphadenopathy.  No palpable mass in the left breast.  No palpable left axillary lymph node.     LABORATORY DATA:  I have reviewed the data as listed Lab Results  Component Value Date   WBC 10.2 08/03/2019   HGB 12.0 08/03/2019   HCT 36.8 08/03/2019   MCV 86.2 08/03/2019   PLT 375 08/03/2019   Recent  Labs    07/25/19 0927 08/03/19 1207  NA  --  136  K  --  3.9  CL  --  105  CO2  --  23  GLUCOSE  --  93  BUN 6 10  CREATININE 0.79 0.67  CALCIUM  --  8.9  GFRNONAA 91 >60  GFRAA 105 >60  PROT  --  8.0  ALBUMIN  --  3.7  AST  --  16  ALT  --  11  ALKPHOS  --  61  BILITOT  --  0.3   Iron/TIBC/Ferritin/ %Sat    Component Value Date/Time   IRON 43 08/03/2019 1244   TIBC 520 (H) 08/03/2019 1244   FERRITIN 7 (L) 08/03/2019 1244   IRONPCTSAT 8 (L) 08/03/2019 1244        ASSESSMENT & PLAN:  1. Invasive carcinoma of breast ([place])   2. Iron deficiency   3. Kidney stone    Mammogram was independent reviewed by me and discussed with patient. Pathology reviewed and discussed. Clinically T2N1, ER/ PR positive HER-2 negative right breast cancer, grade 3. I recommend neoadjuvant chemotherapy to possibly convert clinically positive axillary lymph node to negative, avoiding axillary lymph node dissection.  I explained to the patient the risks and benefits of chemotherapy Adriamycin, Cytoxan followed by Taxol including all but not limited to infusion reaction, temporary or permanent hair loss, mouth sore, nausea, vomiting, low blood counts, bleeding, heart failure, kidney failure, infertility, and risk of life threatening infection and even death, secondary malignancy etc.   Patient voices understanding and willing to proceed chemotherapy.   Obtain pre-chemotherapy cardiac  function evaluation-MUGA -Specifically discussed about potential infertility that may be caused by chemotherapy.  Patient is currently engaged.  She expresses no desire of fertility. -Recommend patient to stop using any estrogen containing birth control measures. Patient has had tubal ligation. -We also discussed about the option of using Cold caps and scalp cooling system.   # Chemotherapy education; Dr.Sakai to place mediport placement.  Antiemetics-Zofran and Compazine; EMLA cream sent to pharmacy Refer to genetic counselor for discussion.  -Clinical trial availability discussed with patient.  She is agreeable.  She will meet clinical trial RN navigator today. -Check baseline CBC, CMP, CA 15-3, CA 27.29. #History of iron deficiency, check iron panel today.-Labs shows that she has iron deficiency.  Hemoglobin normal.  Recommend patient to start oral iron supplementation. # Kidney stone, follows up with Urology  Orders Placed This Encounter  Procedures  . NM Cardiac [place] Rest    Standing Status:   Future    Standing Expiration Date:   08/02/2020    Order Specific Question:   ** REASON FOR EXAM (FREE TEXT)    Answer:   breast cancer, new start of chemo    Order Specific Question:   Will [facility] be the location of this test?    Answer:   Yes    Order Specific Question:   Preferred imaging location?    Answer:   [facility]    Order Specific Question:   Radiology Contrast Protocol - do NOT remove file path    Answer:   _0 [email or web address]  . CBC with Differential/Platelet    Standing Status:   Future    Number of Occurrences:   1    Standing Expiration Date:   08/02/2020  . Comprehensive metabolic panel    Standing Status:   Future    Number of Occurrences:   1    Standing Expiration  Date:   08/02/2020  . Cancer antigen 27.29    Standing Status:   Future    Number of Occurrences:   1    Standing Expiration Date:   08/02/2020  . Cancer antigen  15-3    Standing Status:   Future    Number of Occurrences:   1    Standing Expiration Date:   08/02/2020  . Iron and TIBC    Standing Status:   Future    Number of Occurrences:   1    Standing Expiration Date:   08/02/2020  . Ferritin    Standing Status:   Future    Number of Occurrences:   1    Standing Expiration Date:   08/02/2020  . Ambulatory referral to Genetics    Referral Priority:   Routine    Referral Type:   Consultation    Referral Reason:   Specialty Services Required    Referred to Provider:   Faith Rogue T    Number of Visits Requested:   1    All questions were answered. The patient knows to call the clinic with any problems questions or concerns.  Return of visit: To be determined. Thank you for this kind referral and the opportunity to participate in the care of this patient. A copy of today's note is routed to referring provider  Total face to face encounter time for this patient visit was 80 min. >50% of the time was  spent in counseling and coordination of care.    Earlie Server, MD, PhD Hematology Oncology [facility] Pager- [phone]

## 2019-08-07 ENCOUNTER — Other Ambulatory Visit: Payer: Self-pay

## 2019-08-07 ENCOUNTER — Encounter: Payer: Self-pay | Admitting: *Deleted

## 2019-08-07 DIAGNOSIS — C50919 Malignant neoplasm of unspecified site of unspecified female breast: Secondary | ICD-10-CM

## 2019-08-07 NOTE — Progress Notes (Signed)
Patient called to discuss use of the Dignicap for preventation of hair loss due to chemotherapy.  I am working with Doristine Johns to get a Dignicap machine here at [facility].  I have talked with the [place] has provided information to give the patient on financial assistance programs in [place].  They do offer the Dignicap at [facility] and patient would like to go there for first treatment until we can get the equipment here.  I have sent a message to Laurence Compton, RN for assistance in coordinating her care to get that treatment in [place].  Patient is to let me know when she plans to get her [facility] a cath placed.  She wants to more information about the Dignicap prior to scheduling her appointment.  Will call her back as soon as I hear from Doran.

## 2019-08-08 ENCOUNTER — Ambulatory Visit: Payer: Self-pay | Admitting: Surgery

## 2019-08-08 NOTE — H&P (View-Only) (Signed)
Subjective:   CC: Breast cancer metastasized to axillary lymph node, right (CMS-HCC) [C50.911, C77.3] HPI:  Jessica [name] is a 43 y.o. female who was referred by [facility] for evaluation of above. Change was noted during breast exam and last screening mammogram. Patient does not routinely do self breast exams.  Patient has noted a change on breast exam. Patient reports hormonal therapy. Patient is G4P2. Age of first live birth was 45. Patient did not breast feed. Patient denies nipple discharge. Patient denies previous breast biopsy. Patient denies a personal history of breast cancer.   Past Medical History:  has a past medical history of Abnormal uterine bleeding (2018), Anemia (1993), Encounter for blood transfusion (1995), GERD (gastroesophageal reflux disease), and Kidney stone.  Past Surgical History:  has a past surgical history that includes Cholecystectomy; Tubal ligation; and Cesarean section (1993/1998).  Family History: family history includes Ovarian cancer in her mother; Thyroid disease in her mother; Urolithiasis in her mother.  Pt is not 100% sure if mother's cancer was ovarian.  Mother has NOT undergone genetic testing  Social History:  reports that she has quit smoking. She has a 0.25 pack-year smoking history. She has never used smokeless tobacco. She reports current alcohol use. She reports that she does not use drugs.  Current Medications: has a current medication list which includes the following prescription(s): hydrocodone-acetaminophen, tamsulosin, and xulane.  Allergies:     Allergies as of 07/31/2019  . (No Known Allergies)    ROS:  A 15 point review of systems was performed and was negative except as noted in HPI   Objective:   BP 108/70   Pulse 90   Ht 157.5 cm ('5\' 2"' )   Wt 99.3 kg (219 lb)   LMP 07/09/2019   BMI 40.06 kg/m   Constitutional :  alert, appears stated age, cooperative and no distress  Lymphatics/Throat:  no  asymmetry, masses, or scars  Respiratory:  clear to auscultation bilaterally  Cardiovascular:  regular rate and rhythm  Gastrointestinal: soft, non-tender; bowel sounds normal; no masses,  no organomegaly.   Musculoskeletal: Steady gait and movement  Skin: Cool and moist, no surgical scars  Psychiatric: Normal affect, non-agitated, not confused  Breast:  Chaperone present for exam.  left breast normal without mass, skin or nipple changes or axillary nodes, right breast with palpable swelling vs mass noted on mammogram in right lower inner quadrant with induration on overlying skin.  Pt states there was no overlying skin changes until the recent biopsy that caused swelling.  The swelling continues to improve.  Minimal tenderness on exam today.  No palpable axillary lymph nodes.    LABS:  SURGICAL PATHOLOGY SURGICAL PATHOLOGY CASE: [id number] PATIENT: Jessica [name] Surgical Pathology Report     Specimen Submitted: A. Breast, right, 5:00, 3 CMFN B. Axilla, right  Clinical History: Palpable right breast mass, highly suspicious. Markedly abnormal right axillary lymph node on diagnostic workup; Highly suspicious for malignancy, highly suspicious for metastatic lymph node      DIAGNOSIS: A. RIGHT BREAST, 5:00, 3CMFN; ULTRASOUND-GUIDED NEEDLE CORE BIOPSY: - INVASIVE MAMMARY CARCINOMA, NO SPECIAL TYPE.  Size of invasive carcinoma: 11 mm in this sample Histologic grade of invasive carcinoma: Grade 3            Glandular/tubular differentiation score: 2            Nuclear pleomorphism score: 3            Mitotic rate score: 3  Total score: 8 Ductal carcinoma in situ: Not identified Lymphovascular invasion: Not identified  ER/PR/HER2: Immunohistochemistry will be performed on block A1, with reflex to [place] for HER2 2+. The results will be reported in an addendum.  Comment: The definitive grade will be assigned on the excisional  specimen. These findings were communicated to Electa Sniff, RN via [facility] secure chat on 07/28/19.  B. LYMPH NODE, RIGHT AXILLARY; ULTRASOUND-GUIDED NEEDLE CORE BIOPSY: - POSITIVE FOR METASTATIC CARCINOMA, 10 MM IN THIS SAMPLE.     GROSS DESCRIPTION: A. Labeled: Ultrasound-guided right breast core biopsy at 5 o'clock position and 3 cm from nipple Received: In formalin Time/date in fixative: Tissue procedure time 8:38 AM, tissue put in formalin time 8:30 AM 07/26/2019 Cold ischemic time: Less than minute Total fixation time: 9 hours Core pieces: 4 Size: 1.2 cm in length and 0.1 cm in diameter each Description: White soft tissue cord Ink color: Black Entirely submitted in 1 cassette.  [default value]. Labeled: Ultrasound-guided right axilla core biopsy Received: In formalin Time/date in fixative: Tissue procedure time 8:54 AM, tissue put in formalin time 8:54 AM 07/26/2019 Cold ischemic time: Less than minute Total fixation time: 8 hours Core pieces: 4 Size: From 1.0-1.4 cm in length and 0.1 cm in diameter Description: White soft tissue cores Ink color: Blue Entirely submitted in 1 cassette.      Final Diagnosis performed by Betsy Pries, MD.  Electronically signed 07/28/2019 9:58:50AM The electronic signature indicates that the named Attending Pathologist has evaluated the specimen Technical component performed at [facility], [address] [place], [place] [address] Lab: [phone] Dir: Rush Farmer, MD, MMM  Professional component performed at [facility], [facility], [place], [place],  [address] Lab: [phone] Dir: Dellia Nims. Reuel Derby, MD      RADS: Addendum by Jamie Kato, MD on 07/28/2019  3:29 PM ADDENDUM REPORT: 07/28/2019 13:08  ADDENDUM: PATHOLOGY revealed: A. RIGHT BREAST, 5:00, 3CMFN; ULTRASOUND-GUIDED NEEDLE CORE BIOPSY: - INVASIVE MAMMARY CARCINOMA, NO SPECIAL TYPE. 11 mm in this sample. Grade 3. Ductal  carcinoma in situ: Not identified. Lymphovascular invasion: Not identified. Comment: The definitive grade will be assigned on the excisional specimen.  B. LYMPH NODE, RIGHT AXILLARY; ULTRASOUND-GUIDED NEEDLE CORE BIOPSY: - POSITIVE FOR METASTATIC CARCINOMA, 10 MM IN THIS SAMPLE.  Pathology results are CONCORDANT with imaging findings, per Dr. Ammie Ferrier.  Pathology results were discussed with patient via telephone. The patient reported doing well after the biopsy with tenderness at the site. Post biopsy care instructions were reviewed and questions were answered. The patient was encouraged to call [facility] for any additional concerns.  Recommendation: Surgical referral. Request for surgical referral was relayed to nurse navigators at [facility] by Electa Sniff RN on 07/28/2019.  Addendum by Electa Sniff RN on 07/28/2019.   Electronically Signed  By: Ammie Ferrier M.D.  On: 07/28/2019 13:08  Result Narrative  CLINICAL DATA: 43 year old female presenting for ultrasound-guided biopsy of a right breast mass and right axillary lymph node.  EXAM: ULTRASOUND GUIDED RIGHT BREAST CORE NEEDLE BIOPSY  COMPARISON: Previous exam(s).  FINDINGS: I met with the patient and we discussed the procedure of ultrasound-guided biopsy, including benefits and alternatives. We discussed the high likelihood of a successful procedure. We discussed the risks of the procedure, including infection, bleeding, tissue injury, clip migration, and inadequate sampling. Informed written consent was given. The usual time-out protocol was performed immediately prior to the procedure.  #1 Lesion quadrant: Lower-inner quadrant  Using sterile technique and  1% Lidocaine as local anesthetic, under direct ultrasound visualization, a 14 gauge spring-loaded device was used to perform biopsy of a mass in the right breast at 5 o'clock using an inferior approach. At the  conclusion of the procedure a heart shaped tissue marker clip was deployed into the biopsy cavity.   --------------------------------------------------------------------------------------------------------------------------------------------  #2 Lesion quadrant: Right axilla  Using sterile technique and 1% Lidocaine as local anesthetic, under direct ultrasound visualization, a 14 gauge spring-loaded device was used to perform biopsy of a right axillary lymph node using an inferior approach. At the conclusion of the procedure a HydroMARK shape 3 tissue marker clip was deployed into the biopsy cavity.  Follow up 2 view mammogram was performed and dictated separately.  IMPRESSION: 1. Ultrasound guided biopsy of a right breast mass at 5 o'clock. No apparent complications.  2. Ultrasound guided biopsy of a right axillary lymph node. No apparent complications.  Electronically Signed: By: Ammie Ferrier M.D. On: 07/26/2019 09:12  Other Result Information  Interface, Rad Results In - 07/28/2019  3:29 PM EDT CLINICAL DATA:  43 year old female presenting for ultrasound-guided biopsy of a right breast mass and right axillary lymph node.  EXAM: ULTRASOUND GUIDED RIGHT BREAST CORE NEEDLE BIOPSY  COMPARISON:  Previous exam(s).  FINDINGS: I met with the patient and we discussed the procedure of ultrasound-guided biopsy, including benefits and alternatives. We discussed the high likelihood of a successful procedure. We discussed the risks of the procedure, including infection, bleeding, tissue injury, clip migration, and inadequate sampling. Informed written consent was given. The usual time-out protocol was performed immediately prior to the procedure.  #1 Lesion quadrant: Lower-inner quadrant  Using sterile technique and 1% Lidocaine as local anesthetic, under direct ultrasound visualization, a 14 gauge spring-loaded device was used to perform biopsy of a mass in the right  breast at 5 o'clock using an inferior approach. At the conclusion of the procedure a heart shaped tissue marker clip was deployed into the biopsy cavity.   --------------------------------------------------------------------------------------------------------------------------------------------  #2 Lesion quadrant: Right axilla  Using sterile technique and 1% Lidocaine as local anesthetic, under direct ultrasound visualization, a 14 gauge spring-loaded device was used to perform biopsy of a right axillary lymph node using an inferior approach. At the conclusion of the procedure a HydroMARK shape 3 tissue marker clip was deployed into the biopsy cavity.  Follow up 2 view mammogram was performed and dictated separately.  IMPRESSION: 1. Ultrasound guided biopsy of a right breast mass at 5 o'clock. No apparent complications.  2. Ultrasound guided biopsy of a right axillary lymph node. No apparent complications.  Electronically Signed: By: Ammie Ferrier M.D. On: 07/26/2019 09:12     Assessment:   Breast cancer metastasized to axillary lymph node, right (CMS-HCC) [C50.911, C77.3]  Plan:   1. Breast cancer metastasized to axillary lymph node, right (CMS-HCC) [C50.911, C77.3]  Likely will benefit from neoadjuvant to decrease tumor size for better cosmetic outcome as well as treat the known lymph node mets to possibly avoid full axillary dissection in the future.  Will finalize plan after patient has consultation with oncology/rad oncology.  We did proceed to have port placement discussion as noted below in preparation for possible chemotherapy.  Will schedule actual case once confirmed treatment plan.  Family hx of ovarian cancer-  Pt will confirm with mother if truly ovarian or not.     Discussed the risk of surgery including bleeding, chronic pain, post-op infxn, poor/delayed wound healing, poor cosmesis, seroma, hematoma formation, and possible re-operation to  address  said risks. The risks of general anesthetic, if used, includes MI, CVA, sudden death or even reaction to anesthetic medications also discussed.  Typical post-op recovery time and possbility of activity restrictions were also discussed.  Alternatives include no chemotherapy.  Benefits include curative intent for cancer.  The patient and husband verbalized understanding and all questions were answered to the patient's satisfaction.

## 2019-08-08 NOTE — Patient Instructions (Signed)
Doxorubicin injection What is this medicine? DOXORUBICIN (dox oh ROO bi sin) is a chemotherapy drug. It is used to treat many kinds of cancer like leukemia, lymphoma, neuroblastoma, sarcoma, and Wilms' tumor. It is also used to treat bladder cancer, breast cancer, lung cancer, ovarian cancer, stomach cancer, and thyroid cancer. This medicine may be used for other purposes; ask your health care provider or pharmacist if you have questions. COMMON BRAND NAME(S): Adriamycin, Adriamycin PFS, Adriamycin RDF, Rubex What should I tell my health care provider before I take this medicine? They need to know if you have any of these conditions:  heart disease  history of low blood counts caused by a medicine  liver disease  recent or ongoing radiation therapy  an unusual or allergic reaction to doxorubicin, other chemotherapy agents, other medicines, foods, dyes, or preservatives  pregnant or trying to get pregnant  breast-feeding How should I use this medicine? This drug is given as an infusion into a vein. It is administered in a hospital or clinic by a specially trained health care professional. If you have pain, swelling, burning or any unusual feeling around the site of your injection, tell your health care professional right away. Talk to your pediatrician regarding the use of this medicine in children. Special care may be needed. Overdosage: If you think you have taken too much of this medicine contact a poison control center or emergency room at once. NOTE: This medicine is only for you. Do not share this medicine with others. What if I miss a dose? It is important not to miss your dose. Call your doctor or health care professional if you are unable to keep an appointment. What may interact with this medicine? This medicine may interact with the following medications:  6-mercaptopurine  paclitaxel  phenytoin  St. John's Wort  trastuzumab  verapamil This list may not describe  all possible interactions. Give your health care provider a list of all the medicines, herbs, non-prescription drugs, or dietary supplements you use. Also tell them if you smoke, drink alcohol, or use illegal drugs. Some items may interact with your medicine. What should I watch for while using this medicine? This drug may make you feel generally unwell. This is not uncommon, as chemotherapy can affect healthy cells as well as cancer cells. Report any side effects. Continue your course of treatment even though you feel ill unless your doctor tells you to stop. There is a maximum amount of this medicine you should receive throughout your life. The amount depends on the medical condition being treated and your overall health. Your doctor will watch how much of this medicine you receive in your lifetime. Tell your doctor if you have taken this medicine before. You may need blood work done while you are taking this medicine. Your urine may turn red for a few days after your dose. This is not blood. If your urine is dark or brown, call your doctor. In some cases, you may be given additional medicines to help with side effects. Follow all directions for their use. Call your doctor or health care professional for advice if you get a fever, chills or sore throat, or other symptoms of a cold or flu. Do not treat yourself. This drug decreases your body's ability to fight infections. Try to avoid being around people who are sick. This medicine may increase your risk to bruise or bleed. Call your doctor or health care professional if you notice any unusual bleeding. Talk to your doctor   about your risk of cancer. You may be more at risk for certain types of cancers if you take this medicine. Do not become pregnant while taking this medicine or for 6 months after stopping it. Women should inform their doctor if they wish to become pregnant or think they might be pregnant. Men should not father a child while taking this  medicine and for 6 months after stopping it. There is a potential for serious side effects to an unborn child. Talk to your health care professional or pharmacist for more information. Do not breast-feed an infant while taking this medicine. This medicine has caused ovarian failure in some women and reduced sperm counts in some men This medicine may interfere with the ability to have a child. Talk with your doctor or health care professional if you are concerned about your fertility. This medicine may cause a decrease in Co-Enzyme Q-10. You should make sure that you get enough Co-Enzyme Q-10 while you are taking this medicine. Discuss the foods you eat and the vitamins you take with your health care professional. What side effects may I notice from receiving this medicine? Side effects that you should report to your doctor or health care professional as soon as possible:  allergic reactions like skin rash, itching or hives, swelling of the face, lips, or tongue  breathing problems  chest pain  fast or irregular heartbeat  low blood counts - this medicine may decrease the number of white blood cells, red blood cells and platelets. You may be at increased risk for infections and bleeding.  pain, redness, or irritation at site where injected  signs of infection - fever or chills, cough, sore throat, pain or difficulty passing urine  signs of decreased platelets or bleeding - bruising, pinpoint red spots on the skin, black, tarry stools, blood in the urine  swelling of the ankles, feet, hands  tiredness  weakness Side effects that usually do not require medical attention (report to your doctor or health care professional if they continue or are bothersome):  diarrhea  hair loss  mouth sores  nail discoloration or damage  nausea  red colored urine  vomiting This list may not describe all possible side effects. Call your doctor for medical advice about side effects. You may report  side effects to FDA at 1-800-FDA-1088. Where should I keep my medicine? This drug is given in a hospital or clinic and will not be stored at home. NOTE: This sheet is a summary. It may not cover all possible information. If you have questions about this medicine, talk to your doctor, pharmacist, or health care provider.  2020 [employer] Standard (2017-06-02 11:01:26) Cyclophosphamide injection What is this medicine? CYCLOPHOSPHAMIDE (sye kloe FOSS fa mide) is a chemotherapy drug. It slows the growth of cancer cells. This medicine is used to treat many types of cancer like lymphoma, myeloma, leukemia, breast cancer, and ovarian cancer, to name a few. This medicine may be used for other purposes; ask your health care provider or pharmacist if you have questions. COMMON BRAND NAME(S): Cytoxan, Neosar What should I tell my health care provider before I take this medicine? They need to know if you have any of these conditions:  blood disorders  history of other chemotherapy  infection  kidney disease  liver disease  recent or ongoing radiation therapy  tumors in the bone marrow  an unusual or allergic reaction to cyclophosphamide, other chemotherapy, other medicines, foods, dyes, or preservatives  pregnant or trying to get  pregnant  breast-feeding How should I use this medicine? This drug is usually given as an injection into a vein or muscle or by infusion into a vein. It is administered in a hospital or clinic by a specially trained health care professional. Talk to your pediatrician regarding the use of this medicine in children. Special care may be needed. Overdosage: If you think you have taken too much of this medicine contact a poison control center or emergency room at once. NOTE: This medicine is only for you. Do not share this medicine with others. What if I miss a dose? It is important not to miss your dose. Call your doctor or health care professional if you are  unable to keep an appointment. What may interact with this medicine? This medicine may interact with the following medications:  amiodarone  amphotericin B  azathioprine  certain antiviral medicines for HIV or AIDS such as protease inhibitors (e.g., indinavir, ritonavir) and zidovudine  certain blood pressure medications such as benazepril, captopril, enalapril, fosinopril, lisinopril, moexipril, monopril, perindopril, quinapril, ramipril, trandolapril  certain cancer medications such as anthracyclines (e.g., daunorubicin, doxorubicin), busulfan, cytarabine, paclitaxel, pentostatin, tamoxifen, trastuzumab  certain diuretics such as chlorothiazide, chlorthalidone, hydrochlorothiazide, indapamide, metolazone  certain medicines that treat or prevent blood clots like warfarin  certain muscle relaxants such as succinylcholine  cyclosporine  etanercept  indomethacin  medicines to increase blood counts like filgrastim, pegfilgrastim, sargramostim  medicines used as general anesthesia  metronidazole  natalizumab This list may not describe all possible interactions. Give your health care provider a list of all the medicines, herbs, non-prescription drugs, or dietary supplements you use. Also tell them if you smoke, drink alcohol, or use illegal drugs. Some items may interact with your medicine. What should I watch for while using this medicine? Visit your doctor for checks on your progress. This drug may make you feel generally unwell. This is not uncommon, as chemotherapy can affect healthy cells as well as cancer cells. Report any side effects. Continue your course of treatment even though you feel ill unless your doctor tells you to stop. Drink water or other fluids as directed. Urinate often, even at night. In some cases, you may be given additional medicines to help with side effects. Follow all directions for their use. Call your doctor or health care professional for advice if  you get a fever, chills or sore throat, or other symptoms of a cold or flu. Do not treat yourself. This drug decreases your body's ability to fight infections. Try to avoid being around people who are sick. This medicine may increase your risk to bruise or bleed. Call your doctor or health care professional if you notice any unusual bleeding. Be careful brushing and flossing your teeth or using a toothpick because you may get an infection or bleed more easily. If you have any dental work done, tell your dentist you are receiving this medicine. You may get drowsy or dizzy. Do not drive, use machinery, or do anything that needs mental alertness until you know how this medicine affects you. Do not become pregnant while taking this medicine or for 1 year after stopping it. Women should inform their doctor if they wish to become pregnant or think they might be pregnant. Men should not father a child while taking this medicine and for 4 months after stopping it. There is a potential for serious side effects to an unborn child. Talk to your health care professional or pharmacist for more information. Do not breast-feed  an infant while taking this medicine. This medicine may interfere with the ability to have a child. This medicine has caused ovarian failure in some women. This medicine has caused reduced sperm counts in some men. You should talk with your doctor or health care professional if you are concerned about your fertility. If you are going to have surgery, tell your doctor or health care professional that you have taken this medicine. What side effects may I notice from receiving this medicine? Side effects that you should report to your doctor or health care professional as soon as possible:  allergic reactions like skin rash, itching or hives, swelling of the face, lips, or tongue  low blood counts - this medicine may decrease the number of white blood cells, red blood cells and platelets. You may be  at increased risk for infections and bleeding.  signs of infection - fever or chills, cough, sore throat, pain or difficulty passing urine  signs of decreased platelets or bleeding - bruising, pinpoint red spots on the skin, black, tarry stools, blood in the urine  signs of decreased red blood cells - unusually weak or tired, fainting spells, lightheadedness  breathing problems  dark urine  dizziness  palpitations  swelling of the ankles, feet, hands  trouble passing urine or change in the amount of urine  weight gain  yellowing of the eyes or skin Side effects that usually do not require medical attention (report to your doctor or health care professional if they continue or are bothersome):  changes in nail or skin color  hair loss  missed menstrual periods  mouth sores  nausea, vomiting This list may not describe all possible side effects. Call your doctor for medical advice about side effects. You may report side effects to FDA at 1-800-FDA-1088. Where should I keep my medicine? This drug is given in a hospital or clinic and will not be stored at home. NOTE: This sheet is a summary. It may not cover all possible information. If you have questions about this medicine, talk to your doctor, pharmacist, or health care provider.  2020 [employer]/Gold Standard (2012-09-02 16:22:58) Paclitaxel injection What is this medicine? PACLITAXEL (PAK li TAX el) is a chemotherapy drug. It targets fast dividing cells, like cancer cells, and causes these cells to die. This medicine is used to treat ovarian cancer, breast cancer, lung cancer, Kaposi's sarcoma, and other cancers. This medicine may be used for other purposes; ask your health care provider or pharmacist if you have questions. COMMON BRAND NAME(S): Onxol, Taxol What should I tell my health care provider before I take this medicine? They need to know if you have any of these conditions:  history of irregular  heartbeat  liver disease  low blood counts, like low white cell, platelet, or red cell counts  lung or breathing disease, like asthma  tingling of the fingers or toes, or other nerve disorder  an unusual or allergic reaction to paclitaxel, alcohol, polyoxyethylated castor oil, other chemotherapy, other medicines, foods, dyes, or preservatives  pregnant or trying to get pregnant  breast-feeding How should I use this medicine? This drug is given as an infusion into a vein. It is administered in a hospital or clinic by a specially trained health care professional. Talk to your pediatrician regarding the use of this medicine in children. Special care may be needed. Overdosage: If you think you have taken too much of this medicine contact a poison control center or emergency room at once. NOTE: This  medicine is only for you. Do not share this medicine with others. What if I miss a dose? It is important not to miss your dose. Call your doctor or health care professional if you are unable to keep an appointment. What may interact with this medicine? Do not take this medicine with any of the following medications:  disulfiram  metronidazole This medicine may also interact with the following medications:  antiviral medicines for hepatitis, HIV or AIDS  certain antibiotics like erythromycin and clarithromycin  certain medicines for fungal infections like ketoconazole and itraconazole  certain medicines for seizures like carbamazepine, phenobarbital, phenytoin  gemfibrozil  nefazodone  rifampin  St. John's wort This list may not describe all possible interactions. Give your health care provider a list of all the medicines, herbs, non-prescription drugs, or dietary supplements you use. Also tell them if you smoke, drink alcohol, or use illegal drugs. Some items may interact with your medicine. What should I watch for while using this medicine? Your condition will be monitored  carefully while you are receiving this medicine. You will need important blood work done while you are taking this medicine. This medicine can cause serious allergic reactions. To reduce your risk you will need to take other medicine(s) before treatment with this medicine. If you experience allergic reactions like skin rash, itching or hives, swelling of the face, lips, or tongue, tell your doctor or health care professional right away. In some cases, you may be given additional medicines to help with side effects. Follow all directions for their use. This drug may make you feel generally unwell. This is not uncommon, as chemotherapy can affect healthy cells as well as cancer cells. Report any side effects. Continue your course of treatment even though you feel ill unless your doctor tells you to stop. Call your doctor or health care professional for advice if you get a fever, chills or sore throat, or other symptoms of a cold or flu. Do not treat yourself. This drug decreases your body's ability to fight infections. Try to avoid being around people who are sick. This medicine may increase your risk to bruise or bleed. Call your doctor or health care professional if you notice any unusual bleeding. Be careful brushing and flossing your teeth or using a toothpick because you may get an infection or bleed more easily. If you have any dental work done, tell your dentist you are receiving this medicine. Avoid taking products that contain aspirin, acetaminophen, ibuprofen, naproxen, or ketoprofen unless instructed by your doctor. These medicines may hide a fever. Do not become pregnant while taking this medicine. Women should inform their doctor if they wish to become pregnant or think they might be pregnant. There is a potential for serious side effects to an unborn child. Talk to your health care professional or pharmacist for more information. Do not breast-feed an infant while taking this medicine. Men are  advised not to father a child while receiving this medicine. This product may contain alcohol. Ask your pharmacist or healthcare provider if this medicine contains alcohol. Be sure to tell all healthcare providers you are taking this medicine. Certain medicines, like metronidazole and disulfiram, can cause an unpleasant reaction when taken with alcohol. The reaction includes flushing, headache, nausea, vomiting, sweating, and increased thirst. The reaction can last from 30 minutes to several hours. What side effects may I notice from receiving this medicine? Side effects that you should report to your doctor or health care professional as soon as possible:  allergic reactions like skin rash, itching or hives, swelling of the face, lips, or tongue  breathing problems  changes in vision  fast, irregular heartbeat  high or low blood pressure  mouth sores  pain, tingling, numbness in the hands or feet  signs of decreased platelets or bleeding - bruising, pinpoint red spots on the skin, black, tarry stools, blood in the urine  signs of decreased red blood cells - unusually weak or tired, feeling faint or lightheaded, falls  signs of infection - fever or chills, cough, sore throat, pain or difficulty passing urine  signs and symptoms of liver injury like dark yellow or brown urine; general ill feeling or flu-like symptoms; light-colored stools; loss of appetite; nausea; right upper belly pain; unusually weak or tired; yellowing of the eyes or skin  swelling of the ankles, feet, hands  unusually slow heartbeat Side effects that usually do not require medical attention (report to your doctor or health care professional if they continue or are bothersome):  diarrhea  hair loss  loss of appetite  muscle or joint pain  nausea, vomiting  pain, redness, or irritation at site where injected  tiredness This list may not describe all possible side effects. Call your doctor for medical  advice about side effects. You may report side effects to FDA at 1-800-FDA-1088. Where should I keep my medicine? This drug is given in a hospital or clinic and will not be stored at home. NOTE: This sheet is a summary. It may not cover all possible information. If you have questions about this medicine, talk to your doctor, pharmacist, or health care provider.  2020 [employer] Standard (2017-06-22 13:14:55)

## 2019-08-08 NOTE — H&P (Signed)
Subjective:   CC: Breast cancer metastasized to axillary lymph node, right (CMS-HCC) [C50.911, C77.3] HPI:  Jessica [name] is a 43 y.o. female who was referred by [facility] for evaluation of above. Change was noted during breast exam and last screening mammogram. Patient does not routinely do self breast exams.  Patient has noted a change on breast exam. Patient reports hormonal therapy. Patient is G4P2. Age of first live birth was 86. Patient did not breast feed. Patient denies nipple discharge. Patient denies previous breast biopsy. Patient denies a personal history of breast cancer.   Past Medical History:  has a past medical history of Abnormal uterine bleeding (2018), Anemia (1993), Encounter for blood transfusion (1995), GERD (gastroesophageal reflux disease), and Kidney stone.  Past Surgical History:  has a past surgical history that includes Cholecystectomy; Tubal ligation; and Cesarean section (1993/1998).  Family History: family history includes Ovarian cancer in her mother; Thyroid disease in her mother; Urolithiasis in her mother.  Pt is not 100% sure if mother's cancer was ovarian.  Mother has NOT undergone genetic testing  Social History:  reports that she has quit smoking. She has a 0.25 pack-year smoking history. She has never used smokeless tobacco. She reports current alcohol use. She reports that she does not use drugs.  Current Medications: has a current medication list which includes the following prescription(s): hydrocodone-acetaminophen, tamsulosin, and xulane.  Allergies:     Allergies as of 07/31/2019  . (No Known Allergies)    ROS:  A 15 point review of systems was performed and was negative except as noted in HPI   Objective:   BP 108/70   Pulse 90   Ht 157.5 cm ('5\' 2"' )   Wt 99.3 kg (219 lb)   LMP 07/09/2019   BMI 40.06 kg/m   Constitutional :  alert, appears stated age, cooperative and no distress  Lymphatics/Throat:  no  asymmetry, masses, or scars  Respiratory:  clear to auscultation bilaterally  Cardiovascular:  regular rate and rhythm  Gastrointestinal: soft, non-tender; bowel sounds normal; no masses,  no organomegaly.   Musculoskeletal: Steady gait and movement  Skin: Cool and moist, no surgical scars  Psychiatric: Normal affect, non-agitated, not confused  Breast:  Chaperone present for exam.  left breast normal without mass, skin or nipple changes or axillary nodes, right breast with palpable swelling vs mass noted on mammogram in right lower inner quadrant with induration on overlying skin.  Pt states there was no overlying skin changes until the recent biopsy that caused swelling.  The swelling continues to improve.  Minimal tenderness on exam today.  No palpable axillary lymph nodes.    LABS:  SURGICAL PATHOLOGY SURGICAL PATHOLOGY CASE: [phone] PATIENT: Jessica [name] Surgical Pathology Report     Specimen Submitted: A. Breast, right, 5:00, 3 CMFN B. Axilla, right  Clinical History: Palpable right breast mass, highly suspicious. Markedly abnormal right axillary lymph node on diagnostic workup; Highly suspicious for malignancy, highly suspicious for metastatic lymph node      DIAGNOSIS: A. RIGHT BREAST, 5:00, 3CMFN; ULTRASOUND-GUIDED NEEDLE CORE BIOPSY: - INVASIVE MAMMARY CARCINOMA, NO SPECIAL TYPE.  Size of invasive carcinoma: 11 mm in this sample Histologic grade of invasive carcinoma: Grade 3            Glandular/tubular differentiation score: 2            Nuclear pleomorphism score: 3            Mitotic rate score: 3  Total score: 8 Ductal carcinoma in situ: Not identified Lymphovascular invasion: Not identified  ER/PR/HER2: Immunohistochemistry will be performed on block A1, with reflex to [place] for HER2 2+. The results will be reported in an addendum.  Comment: The definitive grade will be assigned on the excisional  specimen. These findings were communicated to Electa Sniff, RN via [facility] secure chat on 07/28/19.  B. LYMPH NODE, RIGHT AXILLARY; ULTRASOUND-GUIDED NEEDLE CORE BIOPSY: - POSITIVE FOR METASTATIC CARCINOMA, 10 MM IN THIS SAMPLE.     GROSS DESCRIPTION: A. Labeled: Ultrasound-guided right breast core biopsy at 5 o'clock position and 3 cm from nipple Received: In formalin Time/date in fixative: Tissue procedure time 8:38 AM, tissue put in formalin time 8:30 AM 07/26/2019 Cold ischemic time: Less than minute Total fixation time: 9 hours Core pieces: 4 Size: 1.2 cm in length and 0.1 cm in diameter each Description: White soft tissue cord Ink color: Black Entirely submitted in 1 cassette.  [default value]. Labeled: Ultrasound-guided right axilla core biopsy Received: In formalin Time/date in fixative: Tissue procedure time 8:54 AM, tissue put in formalin time 8:54 AM 07/26/2019 Cold ischemic time: Less than minute Total fixation time: 8 hours Core pieces: 4 Size: From 1.0-1.4 cm in length and 0.1 cm in diameter Description: White soft tissue cores Ink color: Blue Entirely submitted in 1 cassette.      Final Diagnosis performed by Betsy Pries, MD.  Electronically signed 07/28/2019 9:58:50AM The electronic signature indicates that the named Attending Pathologist has evaluated the specimen Technical component performed at [facility], [address] [place], [place] [address] Lab: [phone] Dir: Rush Farmer, MD, MMM  Professional component performed at [facility], [facility], [place], [place], [place] [address] Lab: [phone] Dir: Dellia Nims. Reuel Derby, MD      RADS: Addendum by Jamie Kato, MD on 07/28/2019  3:29 PM ADDENDUM REPORT: 07/28/2019 13:08  ADDENDUM: PATHOLOGY revealed: A. RIGHT BREAST, 5:00, 3CMFN; ULTRASOUND-GUIDED NEEDLE CORE BIOPSY: - INVASIVE MAMMARY CARCINOMA, NO SPECIAL TYPE. 11 mm in this sample. Grade 3. Ductal  carcinoma in situ: Not identified. Lymphovascular invasion: Not identified. Comment: The definitive grade will be assigned on the excisional specimen.  B. LYMPH NODE, RIGHT AXILLARY; ULTRASOUND-GUIDED NEEDLE CORE BIOPSY: - POSITIVE FOR METASTATIC CARCINOMA, 10 MM IN THIS SAMPLE.  Pathology results are CONCORDANT with imaging findings, per Dr. Ammie Ferrier.  Pathology results were discussed with patient via telephone. The patient reported doing well after the biopsy with tenderness at the site. Post biopsy care instructions were reviewed and questions were answered. The patient was encouraged to call [facility] for any additional concerns.  Recommendation: Surgical referral. Request for surgical referral was relayed to nurse navigators at [facility] by Electa Sniff RN on 07/28/2019.  Addendum by Electa Sniff RN on 07/28/2019.   Electronically Signed  By: Ammie Ferrier M.D.  On: 07/28/2019 13:08  Result Narrative  CLINICAL DATA: 43 year old female presenting for ultrasound-guided biopsy of a right breast mass and right axillary lymph node.  EXAM: ULTRASOUND GUIDED RIGHT BREAST CORE NEEDLE BIOPSY  COMPARISON: Previous exam(s).  FINDINGS: I met with the patient and we discussed the procedure of ultrasound-guided biopsy, including benefits and alternatives. We discussed the high likelihood of a successful procedure. We discussed the risks of the procedure, including infection, bleeding, tissue injury, clip migration, and inadequate sampling. Informed written consent was given. The usual time-out protocol was performed immediately prior to the procedure.  #1 Lesion quadrant: Lower-inner quadrant  Using sterile technique and  1% Lidocaine as local anesthetic, under direct ultrasound visualization, a 14 gauge spring-loaded device was used to perform biopsy of a mass in the right breast at 5 o'clock using an inferior approach. At the  conclusion of the procedure a heart shaped tissue marker clip was deployed into the biopsy cavity.   --------------------------------------------------------------------------------------------------------------------------------------------  #2 Lesion quadrant: Right axilla  Using sterile technique and 1% Lidocaine as local anesthetic, under direct ultrasound visualization, a 14 gauge spring-loaded device was used to perform biopsy of a right axillary lymph node using an inferior approach. At the conclusion of the procedure a HydroMARK shape 3 tissue marker clip was deployed into the biopsy cavity.  Follow up 2 view mammogram was performed and dictated separately.  IMPRESSION: 1. Ultrasound guided biopsy of a right breast mass at 5 o'clock. No apparent complications.  2. Ultrasound guided biopsy of a right axillary lymph node. No apparent complications.  Electronically Signed: By: Ammie Ferrier M.D. On: 07/26/2019 09:12  Other Result Information  Interface, Rad Results In - 07/28/2019  3:29 PM EDT CLINICAL DATA:  43 year old female presenting for ultrasound-guided biopsy of a right breast mass and right axillary lymph node.  EXAM: ULTRASOUND GUIDED RIGHT BREAST CORE NEEDLE BIOPSY  COMPARISON:  Previous exam(s).  FINDINGS: I met with the patient and we discussed the procedure of ultrasound-guided biopsy, including benefits and alternatives. We discussed the high likelihood of a successful procedure. We discussed the risks of the procedure, including infection, bleeding, tissue injury, clip migration, and inadequate sampling. Informed written consent was given. The usual time-out protocol was performed immediately prior to the procedure.  #1 Lesion quadrant: Lower-inner quadrant  Using sterile technique and 1% Lidocaine as local anesthetic, under direct ultrasound visualization, a 14 gauge spring-loaded device was used to perform biopsy of a mass in the right  breast at 5 o'clock using an inferior approach. At the conclusion of the procedure a heart shaped tissue marker clip was deployed into the biopsy cavity.   --------------------------------------------------------------------------------------------------------------------------------------------  #2 Lesion quadrant: Right axilla  Using sterile technique and 1% Lidocaine as local anesthetic, under direct ultrasound visualization, a 14 gauge spring-loaded device was used to perform biopsy of a right axillary lymph node using an inferior approach. At the conclusion of the procedure a HydroMARK shape 3 tissue marker clip was deployed into the biopsy cavity.  Follow up 2 view mammogram was performed and dictated separately.  IMPRESSION: 1. Ultrasound guided biopsy of a right breast mass at 5 o'clock. No apparent complications.  2. Ultrasound guided biopsy of a right axillary lymph node. No apparent complications.  Electronically Signed: By: Ammie Ferrier M.D. On: 07/26/2019 09:12     Assessment:   Breast cancer metastasized to axillary lymph node, right (CMS-HCC) [C50.911, C77.3]  Plan:   1. Breast cancer metastasized to axillary lymph node, right (CMS-HCC) [C50.911, C77.3]  Likely will benefit from neoadjuvant to decrease tumor size for better cosmetic outcome as well as treat the known lymph node mets to possibly avoid full axillary dissection in the future.  Will finalize plan after patient has consultation with oncology/rad oncology.  We did proceed to have port placement discussion as noted below in preparation for possible chemotherapy.  Will schedule actual case once confirmed treatment plan.  Family hx of ovarian cancer-  Pt will confirm with mother if truly ovarian or not.     Discussed the risk of surgery including bleeding, chronic pain, post-op infxn, poor/delayed wound healing, poor cosmesis, seroma, hematoma formation, and possible re-operation to  address  said risks. The risks of general anesthetic, if used, includes MI, CVA, sudden death or even reaction to anesthetic medications also discussed.  Typical post-op recovery time and possbility of activity restrictions were also discussed.  Alternatives include no chemotherapy.  Benefits include curative intent for cancer.  The patient and husband verbalized understanding and all questions were answered to the patient's satisfaction.

## 2019-08-09 ENCOUNTER — Other Ambulatory Visit: Payer: Self-pay

## 2019-08-09 ENCOUNTER — Ambulatory Visit
Admission: RE | Admit: 2019-08-09 | Discharge: 2019-08-09 | Disposition: A | Discharge status: Home / Self Care | Payer: BC Managed Care – PPO | Source: Ambulatory Visit | Attending: Oncology | Admitting: Oncology

## 2019-08-09 ENCOUNTER — Other Ambulatory Visit: Payer: Self-pay | Admitting: Oncology

## 2019-08-09 ENCOUNTER — Inpatient Hospital Stay (HOSPITAL_BASED_OUTPATIENT_CLINIC_OR_DEPARTMENT_OTHER): Payer: BC Managed Care – PPO | Admitting: Oncology

## 2019-08-09 ENCOUNTER — Telehealth: Payer: Self-pay | Admitting: *Deleted

## 2019-08-09 ENCOUNTER — Inpatient Hospital Stay: Payer: BC Managed Care – PPO

## 2019-08-09 ENCOUNTER — Encounter: Payer: Self-pay | Admitting: *Deleted

## 2019-08-09 DIAGNOSIS — C50919 Malignant neoplasm of unspecified site of unspecified female breast: Secondary | ICD-10-CM | POA: Diagnosis not present

## 2019-08-09 DIAGNOSIS — C50311 Malignant neoplasm of lower-inner quadrant of right female breast: Secondary | ICD-10-CM | POA: Diagnosis not present

## 2019-08-09 MED ORDER — TECHNETIUM TC 99M-LABELED RED BLOOD CELLS IV KIT
20.0000 | PACK | Freq: Once | INTRAVENOUS | Status: AC | PRN
  Administered 2019-08-09: 19.95 via INTRAVENOUS

## 2019-08-09 NOTE — Progress Notes (Signed)
[place]  Telephone:([phone] [phone]  Patient Care Team: Ricardo Jericho, NP as PCP - General (Family Medicine) Rico Junker, RN as Registered Nurse   Name of the patient: Rillie Riffel  [id number]  [birth year]   Date of visit: 08/09/19  Diagnosis- Breast Cancer   Chief complaint/Reason for visit- Initial Meeting for [facility], preparing for starting chemotherapy  Heme/Onc history:  Oncology History  Invasive carcinoma of breast ([place])  08/05/2019 Initial Diagnosis   Invasive carcinoma of breast ([place])   08/11/2019 -  Chemotherapy   The patient had DOXOrubicin (ADRIAMYCIN) chemo injection 126 mg, 60 mg/m2, Intravenous,  Once, 0 of 4 cycles palonosetron (ALOXI) injection 0.25 mg, 0.25 mg, Intravenous,  Once, 0 of 4 cycles pegfilgrastim (NEULASTA ONPRO KIT) injection 6 mg, 6 mg, Subcutaneous, Once, 0 of 5 cycles cyclophosphamide (CYTOXAN) 1,260 mg in sodium chloride 0.9 % 250 mL chemo infusion, 600 mg/m2, Intravenous,  Once, 0 of 4 cycles PACLitaxel (TAXOL) 168 mg in sodium chloride 0.9 % 250 mL chemo infusion (</= 62m/m2), 80 mg/m2, Intravenous,  Once, 0 of 12 cycles fosaprepitant (EMEND) 150 mg, dexamethasone (DECADRON) 12 mg in sodium chloride 0.9 % 145 mL IVPB, , Intravenous,  Once, 0 of 4 cycles  for chemotherapy treatment.      Interval history-Mrs. LOrris a 43year old female who presents to chemo care clinic today for initial meeting in preparation for starting chemotherapy. I introduced the chemo care clinic and we discussed that the role of the clinic is to assist those who are at an increased risk of emergency room visits and/or complications during the course of chemotherapy treatment. We discussed that the increased risk takes into account factors such as age, performance status, and co-morbidities. We also discussed that for some, this might include barriers to care such as  not having a primary care provider, lack of insurance/transportation, or not being able to afford medications. We discussed that the goal of the program is to help prevent unplanned ER visits and help reduce complications during chemotherapy. We do this by discussing specific risk factors to each individual and identifying ways that we can help improve these risk factors and reduce barriers to care.   ECOG FS:0 - Asymptomatic  Review of systems- Review of Systems  Constitutional: Positive for malaise/fatigue.  Skin:       Palpable breast mass  Psychiatric/Behavioral: The patient is nervous/anxious.      Current treatment- Plan for dose dense ac/taxol.   No Known Allergies  Past Medical History:  Diagnosis Date   Frequency    Kidney stones    Obesity    Right flank pain     Past Surgical History:  Procedure Laterality Date   CESAREAN SECTION     x 2   CHOLECYSTECTOMY     DILATION AND CURETTAGE OF UTERUS     TUBAL LIGATION      Social History   Socioeconomic History   Marital status: Single    Spouse name: Not on file   Number of children: Not on file   Years of education: Not on file   Highest education level: Not on file  Occupational History   Not on file  Social Needs   Financial resource strain: Not on file   Food insecurity    Worry: Not on file    Inability: Not on file   Transportation needs    Medical: Not on file  Non-medical: Not on file  Tobacco Use   Smoking status: Never Smoker   Smokeless tobacco: Never Used  Substance and Sexual Activity   Alcohol use: No   Drug use: No   Sexual activity: Yes  Lifestyle   Physical activity    Days per week: Not on file    Minutes per session: Not on file   Stress: Not on file  Relationships   Social connections    Talks on phone: Not on file    Gets together: Not on file    Attends religious service: Not on file    Active member of club or organization: Not on file    Attends  meetings of clubs or organizations: Not on file    Relationship status: Not on file   Intimate partner violence    Fear of current or ex partner: Not on file    Emotionally abused: Not on file    Physically abused: Not on file    Forced sexual activity: Not on file  Other Topics Concern   Not on file  Social History Narrative   Not on file    Family History  Problem Relation Age of Onset   Kidney Stones Other    Cervical cancer Mother    Kidney Stones Mother    Other Sister    Breast cancer Neg Hx      Current Outpatient Medications:    Aspirin-Acetaminophen-Caffeine (GOODYS EXTRA STRENGTH) [phone] MG PACK, Take 1 packet by mouth daily as needed (headaches.)., Disp: , Rfl:    dexamethasone (DECADRON) 4 MG tablet, Take 2 tablets by mouth once a day on the day after chemotherapy and then take 2 tablets two times a day for 2 days. Take with food., Disp: 30 tablet, Rfl: 1   ferrous sulfate 325 (65 FE) MG EC tablet, Take 1 tablet (325 mg total) by mouth 2 (two) times daily with a meal., Disp: 60 tablet, Rfl: 1   gabapentin (NEURONTIN) 600 MG tablet, Take 600 mg by mouth 3 (three) times daily., Disp: , Rfl:    lidocaine-prilocaine (EMLA) cream, Apply to affected area once, Disp: 30 g, Rfl: 3   meloxicam (MOBIC) 15 MG tablet, Take 15 mg by mouth daily., Disp: , Rfl:    ondansetron (ZOFRAN) 8 MG tablet, Take 1 tablet (8 mg total) by mouth 2 (two) times daily as needed. Start on the third day after chemotherapy., Disp: 30 tablet, Rfl: 1   prochlorperazine (COMPAZINE) 10 MG tablet, Take 1 tablet (10 mg total) by mouth every 6 (six) hours as needed (Nausea or vomiting)., Disp: 30 tablet, Rfl: 1  Physical exam: There were no vitals filed for this visit. Physical Exam Constitutional:      Appearance: Normal appearance.  HENT:     Head: Normocephalic and atraumatic.  Eyes:     Pupils: Pupils are equal, round, and reactive to light.  Neck:     Musculoskeletal: Normal  range of motion.  Cardiovascular:     Rate and Rhythm: Normal rate and regular rhythm.     Heart sounds: Normal heart sounds. No murmur.  Pulmonary:     Effort: Pulmonary effort is normal.     Breath sounds: Normal breath sounds. No wheezing.  Chest:     Breasts:        Right: Mass present.   Abdominal:     General: Bowel sounds are normal. There is no distension.     Palpations: Abdomen is soft.  Tenderness: There is no abdominal tenderness.  Musculoskeletal: Normal range of motion.  Skin:    General: Skin is warm and dry.     Findings: No rash.  Neurological:     Mental Status: She is alert and oriented to person, place, and time.  Psychiatric:        Judgment: Judgment normal.      CMP Latest Ref Rng & Units 08/03/2019  Glucose 70 - 99 mg/dL 93  BUN 6 - 20 mg/dL 10  Creatinine 0.44 - 1.00 mg/dL 0.67  Sodium 135 - 145 mmol/L 136  Potassium 3.5 - 5.1 mmol/L 3.9  Chloride 98 - 111 mmol/L 105  CO2 22 - 32 mmol/L 23  Calcium 8.9 - 10.3 mg/dL 8.9  Total Protein 6.5 - 8.1 g/dL 8.0  Total Bilirubin 0.3 - 1.2 mg/dL 0.3  Alkaline Phos 38 - 126 U/L 61  AST 15 - 41 U/L 16  ALT 0 - 44 U/L 11   CBC Latest Ref Rng & Units 08/03/2019  WBC 4.0 - 10.5 K/uL 10.2  Hemoglobin 12.0 - 15.0 g/dL 12.0  Hematocrit 36.0 - 46.0 % 36.8  Platelets 150 - 400 K/uL 375    No images are attached to the encounter.  Dg Abd 1 View  Result Date: 07/25/2019 CLINICAL DATA:  Flank pain EXAM: ABDOMEN - 1 VIEW COMPARISON:  None. FINDINGS: The bowel gas pattern is normal. Surgical clips seen in the right upper quadrant. Moderate amount of colonic stool. No radio-opaque calculi or other significant radiographic abnormality are seen. IMPRESSION: No calculi seen overlying the renal shadows. Electronically Signed   By: Prudencio Pair M.D.   On: 07/25/2019 14:16   US Breast Ltd Uni Right Inc Axilla  Result Date: 07/20/2019 CLINICAL DATA:  Patient presents for palpable abnormality within the right breast.  EXAM: DIGITAL DIAGNOSTIC BILATERAL MAMMOGRAM WITH CAD AND TOMO ULTRASOUND RIGHT BREAST COMPARISON:  Previous exam(s). ACR Breast Density Category c: The breast tissue is heterogeneously dense, which may obscure small masses. FINDINGS: There is a lobular mass within the lower inner right breast middle depth, further evaluated with spot tangential view at the site of palpable concern. No additional concerning masses, calcifications or distortion identified within either breast. Mammographic images were processed with CAD. Targeted ultrasound is performed, showing a 3.4 x 2.5 x 3.4 cm hypoechoic irregular mass right breast 5 o'clock position 3 cm from nipple at the site of palpable concern. There is a single cortically thickened abnormal appearing right axillary node with a cortical thickness of 1.6 cm. IMPRESSION: Suspicious palpable right breast mass. Suspicious cortically thickened right axillary lymph node. RECOMMENDATION: Ultrasound-guided core needle biopsy of the right breast mass 5 o'clock position. Ultrasound-guided core needle biopsy of the cortically thickened right axillary lymph node. I have discussed the findings and recommendations with the patient. If applicable, a reminder letter will be sent to the patient regarding the next appointment. BI-RADS CATEGORY  5: Highly suggestive of malignancy. Electronically Signed   By: Lovey Newcomer M.D.   On: 07/20/2019 15:47   Mm Diag Breast Tomo Bilateral  Result Date: 07/20/2019 CLINICAL DATA:  Patient presents for palpable abnormality within the right breast. EXAM: DIGITAL DIAGNOSTIC BILATERAL MAMMOGRAM WITH CAD AND TOMO ULTRASOUND RIGHT BREAST COMPARISON:  Previous exam(s). ACR Breast Density Category c: The breast tissue is heterogeneously dense, which may obscure small masses. FINDINGS: There is a lobular mass within the lower inner right breast middle depth, further evaluated with spot tangential view at the site of  palpable concern. No additional concerning  masses, calcifications or distortion identified within either breast. Mammographic images were processed with CAD. Targeted ultrasound is performed, showing a 3.4 x 2.5 x 3.4 cm hypoechoic irregular mass right breast 5 o'clock position 3 cm from nipple at the site of palpable concern. There is a single cortically thickened abnormal appearing right axillary node with a cortical thickness of 1.6 cm. IMPRESSION: Suspicious palpable right breast mass. Suspicious cortically thickened right axillary lymph node. RECOMMENDATION: Ultrasound-guided core needle biopsy of the right breast mass 5 o'clock position. Ultrasound-guided core needle biopsy of the cortically thickened right axillary lymph node. I have discussed the findings and recommendations with the patient. If applicable, a reminder letter will be sent to the patient regarding the next appointment. BI-RADS CATEGORY  5: Highly suggestive of malignancy. Electronically Signed   By: Lovey Newcomer M.D.   On: 07/20/2019 15:47   Mm Clip Placement Right  Result Date: 07/26/2019 CLINICAL DATA:  Post biopsy mammogram of the right breast for clip placement. EXAM: DIAGNOSTIC RIGHT MAMMOGRAM POST ULTRASOUND BIOPSY COMPARISON:  Previous exam(s). FINDINGS: Mammographic images were obtained following ultrasound guided biopsy of a mass in the right breast and a right axillary lymph node. The heart shaped biopsy marking clip is well positioned within the mass in the lower-inner right breast. The spiral shaped HydroMARK clip is well positioned in the right axillary lymph node. IMPRESSION: 1. Appropriate positioning of the heart shaped biopsy marking clip in the mass in the lower-inner right breast. 2. Appropriate positioning of the spiral shaped HydroMARK clip in the right axillary lymph node. Final Assessment: Post Procedure Mammograms for Marker Placement Electronically Signed   By: Ammie Ferrier M.D.   On: 07/26/2019 09:13   [place] Rt Breast Bx W Loc Dev 1st Lesion Img Bx Spec  US Guide  Addendum Date: 07/31/2019   ADDENDUM REPORT: 07/31/2019 11:24 ADDENDUM: Surgical consultation was arranged for patient to see DR. Benjamine Sprague on 07/31/2019. Addendum by Electa Sniff RN on 07/31/2019. Electronically Signed   By: Ammie Ferrier M.D.   On: 07/31/2019 11:24   Addendum Date: 07/28/2019   ADDENDUM REPORT: 07/28/2019 13:08 ADDENDUM: PATHOLOGY revealed: A. RIGHT BREAST, 5:00, 3CMFN; ULTRASOUND-GUIDED NEEDLE CORE BIOPSY: - INVASIVE MAMMARY CARCINOMA, NO SPECIAL TYPE. 11 mm in this sample. Grade 3. Ductal carcinoma in situ: Not identified. Lymphovascular invasion: Not identified. Comment: The definitive grade will be assigned on the excisional specimen. B. LYMPH NODE, RIGHT AXILLARY; ULTRASOUND-GUIDED NEEDLE CORE BIOPSY: - POSITIVE FOR METASTATIC CARCINOMA, 10 MM IN THIS SAMPLE. Pathology results are CONCORDANT with imaging findings, per Dr. Ammie Ferrier. Pathology results were discussed with patient via telephone. The patient reported doing well after the biopsy with tenderness at the site. Post biopsy care instructions were reviewed and questions were answered. The patient was encouraged to call [facility] for any additional concerns. Recommendation: Surgical referral. Request for surgical referral was relayed to nurse navigators at [facility] by Electa Sniff RN on 07/28/2019. Addendum by Electa Sniff RN on 07/28/2019. Electronically Signed   By: Ammie Ferrier M.D.   On: 07/28/2019 13:08   Result Date: 07/31/2019 CLINICAL DATA:  43 year old female presenting for ultrasound-guided biopsy of a right breast mass and right axillary lymph node. EXAM: ULTRASOUND GUIDED RIGHT BREAST CORE NEEDLE BIOPSY COMPARISON:  Previous exam(s). FINDINGS: I met with the patient and we discussed the procedure of ultrasound-guided biopsy, including benefits and alternatives. We discussed the high likelihood of a successful procedure. We discussed the  risks of the procedure,  including infection, bleeding, tissue injury, clip migration, and inadequate sampling. Informed written consent was given. The usual time-out protocol was performed immediately prior to the procedure. #1 Lesion quadrant: Lower-inner quadrant Using sterile technique and 1% Lidocaine as local anesthetic, under direct ultrasound visualization, a 14 gauge spring-loaded device was used to perform biopsy of a mass in the right breast at 5 o'clock using an inferior approach. At the conclusion of the procedure a heart shaped tissue marker clip was deployed into the biopsy cavity. -------------------------------------------------------------------------------------------------------------------------------------------- #2 Lesion quadrant: Right axilla Using sterile technique and 1% Lidocaine as local anesthetic, under direct ultrasound visualization, a 14 gauge spring-loaded device was used to perform biopsy of a right axillary lymph node using an inferior approach. At the conclusion of the procedure a HydroMARK shape 3 tissue marker clip was deployed into the biopsy cavity. Follow up 2 view mammogram was performed and dictated separately. IMPRESSION: 1. Ultrasound guided biopsy of a right breast mass at 5 o'clock. No apparent complications. 2. Ultrasound guided biopsy of a right axillary lymph node. No apparent complications. Electronically Signed: By: Ammie Ferrier M.D. On: 07/26/2019 09:12   [place] Rt Breast Bx W Loc Dev Ea Add Lesion Img Bx Spec US Guide  Addendum Date: 07/31/2019   ADDENDUM REPORT: 07/31/2019 11:24 ADDENDUM: Surgical consultation was arranged for patient to see DR. Benjamine Sprague on 07/31/2019. Addendum by Electa Sniff RN on 07/31/2019. Electronically Signed   By: Ammie Ferrier M.D.   On: 07/31/2019 11:24   Addendum Date: 07/28/2019   ADDENDUM REPORT: 07/28/2019 13:08 ADDENDUM: PATHOLOGY revealed: A. RIGHT BREAST, 5:00, 3CMFN; ULTRASOUND-GUIDED NEEDLE CORE BIOPSY: - INVASIVE MAMMARY CARCINOMA, NO  SPECIAL TYPE. 11 mm in this sample. Grade 3. Ductal carcinoma in situ: Not identified. Lymphovascular invasion: Not identified. Comment: The definitive grade will be assigned on the excisional specimen. B. LYMPH NODE, RIGHT AXILLARY; ULTRASOUND-GUIDED NEEDLE CORE BIOPSY: - POSITIVE FOR METASTATIC CARCINOMA, 10 MM IN THIS SAMPLE. Pathology results are CONCORDANT with imaging findings, per Dr. Ammie Ferrier. Pathology results were discussed with patient via telephone. The patient reported doing well after the biopsy with tenderness at the site. Post biopsy care instructions were reviewed and questions were answered. The patient was encouraged to call [facility] for any additional concerns. Recommendation: Surgical referral. Request for surgical referral was relayed to nurse navigators at [facility] by Electa Sniff RN on 07/28/2019. Addendum by Electa Sniff RN on 07/28/2019. Electronically Signed   By: Ammie Ferrier M.D.   On: 07/28/2019 13:08   Result Date: 07/31/2019 CLINICAL DATA:  43 year old female presenting for ultrasound-guided biopsy of a right breast mass and right axillary lymph node. EXAM: ULTRASOUND GUIDED RIGHT BREAST CORE NEEDLE BIOPSY COMPARISON:  Previous exam(s). FINDINGS: I met with the patient and we discussed the procedure of ultrasound-guided biopsy, including benefits and alternatives. We discussed the high likelihood of a successful procedure. We discussed the risks of the procedure, including infection, bleeding, tissue injury, clip migration, and inadequate sampling. Informed written consent was given. The usual time-out protocol was performed immediately prior to the procedure. #1 Lesion quadrant: Lower-inner quadrant Using sterile technique and 1% Lidocaine as local anesthetic, under direct ultrasound visualization, a 14 gauge spring-loaded device was used to perform biopsy of a mass in the right breast at 5 o'clock using an inferior approach. At the  conclusion of the procedure a heart shaped tissue marker clip was deployed into the biopsy cavity. -------------------------------------------------------------------------------------------------------------------------------------------- #2 Lesion quadrant: Right axilla  Using sterile technique and 1% Lidocaine as local anesthetic, under direct ultrasound visualization, a 14 gauge spring-loaded device was used to perform biopsy of a right axillary lymph node using an inferior approach. At the conclusion of the procedure a HydroMARK shape 3 tissue marker clip was deployed into the biopsy cavity. Follow up 2 view mammogram was performed and dictated separately. IMPRESSION: 1. Ultrasound guided biopsy of a right breast mass at 5 o'clock. No apparent complications. 2. Ultrasound guided biopsy of a right axillary lymph node. No apparent complications. Electronically Signed: By: Ammie Ferrier M.D. On: 07/26/2019 09:12     Assessment and plan- Patient is a 43 y.o. 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    1. Cancer-Patient presented to her PCP for palpable right breast mass.  Subsequent mammogram and ultrasound on 07/20/2019 showed suspicious palpable right breast mass measuring 3.4 x 2.5 x 3.4 cm.  Suspicious right axillary lymph node.  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  Right axillary lymph node positive for metastatic carcinoma ER positive PR positive HER-2 negative.  She is scheduled to begin dose dense AC every 14 days and Taxol every 7 days.  She will have a MUGA scan this afternoon and port placement next week.  She will then begin chemotherapy.  2. Chemo Care Clinic/High Risk for ER/Hospitalization during chemotherapy- We discussed the role of the chemo care clinic and identified patient specific risk factors. I discussed that patient was identified as high risk  primarily based on: comorbidities.   Her pmh is positive for:  Past Medical History:  Diagnosis Date   Frequency    Kidney stones    Obesity    Right flank pain    Her psh is positive for:  Past Surgical History:  Procedure Laterality Date   CESAREAN SECTION     x 2   CHOLECYSTECTOMY     DILATION AND CURETTAGE OF UTERUS     TUBAL LIGATION     She is currently followed by Eulogio Bear, NP her PCP.  She has health insurance through [employer].  She is engaged and lives at home with her fianc.  She has 2 grown children; ages 44 and 68.  Her 62 year old still lives at home.  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  She has worked for less than 1 year and does not qualify for [employer].  She is interested in short-term disability and long-term disability.  She suspects her last day at work will be this Friday because she will have a COVID-19 test performed requiring her to quarantine until results.   3. Social Determinants of Health- we discussed that social determinants of health may have significant impacts on health and outcomes for cancer patients.  Today we discussed specific social determinants of performance status, alcohol use, depression, financial needs, food insecurity, housing, interpersonal violence, social connections, stress, tobacco use, and transportation.  After lengthy discussion the following were identified as areas of need include financial insecurity, Food insecurity, housing insecurity, concerns of stress. We discussed that living with cancer can create tremendous financial burden.  We discussed options for assistance. I asked that if assistance is needed in affording medications or paying bills to please let us know so that we can provide assistance. We  discussed options for food including social services.  We will also notify [facility] to see if cancer  center can provide support.  Patient informed of food pantry at cancer center and was provided with care package today.  Please notify nursing if un-met needs.We discussed referral to social work. Will also discuss with Elease Etienne to see if [facility] can provide support of utility bills, etc. We discussed options for support groups at the cancer center. If interested, please notify nurse navigator to enroll. We discussed options for managing stress including healthy eating, exercise as well as participating in no charge counseling services at the cancer center and support groups.  If these are of interest, patient can notify either myself or primary nursing team.   4. Co-morbidities Complicating Care:   Past Medical History:  Diagnosis Date   Frequency    Kidney stones    Obesity    Right flank pain     We also discussed the role of the Symptom Management Clinic at [facility] for acute issues and methods of contacting clinic/provider. She denies needing specific assistance at this time and She will be followed by Tanya Nones, RN (Nurse Navigator).    Visit Diagnosis 1. Invasive carcinoma of breast [facility])      Patient expressed understanding and was in agreement with this plan. She also understands that She can call clinic at any time with any questions, concerns, or complaints.   A total of (25) minutes of face-to-face time was spent with this patient with greater than 50% of that time in counseling and care-coordination.  Rulon Abide, NP, AGNP-C [place] at [facility] [phone] (office)  CC: Dr. Tasia Catchings

## 2019-08-09 NOTE — Telephone Encounter (Signed)
Called pt to discuss Dignicap and scheduling appts with Dr. Burr Medico. No answer and unable to leave msg. Sent email to pt with information attached on Dignicap as well requesting return call to further discuss.

## 2019-08-09 NOTE — Progress Notes (Signed)
Talked to patient today and informed her that she will need to see Dr. Burr Medico prior to her treatment at [facility].  She will get her first 1-2 infusions there so she can use the Dignicap and then return to [place] when we have our machine available.  Informed her that [place] or Varney Biles, one of the nurse navigators should contact her to schedule her appointment.  She is agreeable to the plan.

## 2019-08-10 ENCOUNTER — Encounter: Payer: Self-pay | Admitting: Oncology

## 2019-08-10 ENCOUNTER — Encounter: Payer: Self-pay | Admitting: *Deleted

## 2019-08-10 ENCOUNTER — Telehealth: Payer: Self-pay | Admitting: *Deleted

## 2019-08-10 NOTE — Telephone Encounter (Signed)
Spoke to pt regarding appt for 1st [facility] and Dignicap. Gave instructions and directions. Discussed process for dignicap. Gave navigation resources and contact information. Denies further questions or needs at this time.

## 2019-08-11 ENCOUNTER — Encounter: Payer: Self-pay | Admitting: Oncology

## 2019-08-11 ENCOUNTER — Other Ambulatory Visit: Payer: Self-pay

## 2019-08-11 ENCOUNTER — Encounter
Admission: RE | Admit: 2019-08-11 | Discharge: 2019-08-11 | Disposition: A | Discharge status: Home / Self Care | Payer: BC Managed Care – PPO | Source: Ambulatory Visit | Attending: Surgery | Admitting: Surgery

## 2019-08-11 HISTORY — DX: Malignant (primary) neoplasm, unspecified: C80.1

## 2019-08-14 ENCOUNTER — Other Ambulatory Visit: Payer: Self-pay

## 2019-08-14 ENCOUNTER — Other Ambulatory Visit
Admission: RE | Admit: 2019-08-14 | Discharge: 2019-08-14 | Disposition: A | Discharge status: Home / Self Care | Payer: BC Managed Care – PPO | Source: Ambulatory Visit | Attending: Surgery | Admitting: Surgery

## 2019-08-14 ENCOUNTER — Encounter: Payer: Self-pay | Admitting: Oncology

## 2019-08-14 DIAGNOSIS — Z01812 Encounter for preprocedural laboratory examination: Secondary | ICD-10-CM | POA: Insufficient documentation

## 2019-08-14 DIAGNOSIS — Z20828 Contact with and (suspected) exposure to other viral communicable diseases: Secondary | ICD-10-CM | POA: Diagnosis not present

## 2019-08-14 LAB — SARS CORONAVIRUS 2 (TAT 6-24 HRS): SARS Coronavirus 2: NEGATIVE

## 2019-08-14 NOTE — Patient Instructions (Addendum)
INSTRUCTIONS FOR SURGERY     Your surgery is scheduled for:   Thursday, October 15TH     To find out your arrival time for the day of surgery,          please call [phone] between 1 pm and 3 pm on :  Wednesday, October 14TH     When you arrive for surgery, report to the [facility].       Do NOT stop on the first floor to register.    REMEMBER: Instructions that are not followed completely may result in serious medical risk,  up to and including death, or upon the discretion of your surgeon and anesthesiologist,            your surgery may need to be rescheduled.  __X__ 1. Do not eat food after midnight the night before your procedure.                    No gum, candy, lozenger, tic tacs, tums or hard candies.                  ABSOLUTELY NOTHING SOLID IN YOUR MOUTH AFTER MIDNIGHT                    You may drink unlimited clear liquids up to 2 hours before you are scheduled to arrive for surgery.                   Do not drink anything within those 2 hours unless you need to take medicine, then take the                   smallest amount you need.  Clear liquids include:  water, apple juice without pulp,                   any flavor Gatorade, Black coffee, black tea.  Sugar may be added but no dairy/ honey /lemon.                        Broth and jello is not considered a clear liquid.  __x__  2. On the morning of surgery, please brush your teeth with toothpaste and water. You may rinse with                  mouthwash if you wish but DO NOT SWALLOW TOOTHPASTE OR MOUTHWASH  __X___3. NO alcohol for 24 hours before or after surgery.  __x___ 4.  Do NOT smoke or use e-cigarettes for 24 HOURS PRIOR TO SURGERY.                      DO NOT  Use any chewable tobacco products for at least 6 hours prior to surgery.  __x___ 5. If you start any new medication after this appointment and prior to surgery, please             Bring it with you on the day of surgery.  ___x__ 6. Notify your doctor if there is any change in your medical condition, such as fever,  infection, vomitting,                   Diarrhea or any open sores.  __x___ 7.  USE the CHG SOAP as instructed, the night before surgery and the day of surgery.                   Once you have washed with this soap, do NOT use any of the following: Powders, perfumes                    or lotions. Please do not wear make up, hairpins, clips or nail polish. You MAY wear deodorant.                   Men may shave their face and neck.  Women need to shave 48 hours prior to surgery.                   DO NOT wear ANY jewelry on the day of surgery. If there are rings that are too tight to                    remove easily, please address this prior to the surgery day. Piercings need to be removed.                                                                     NO METAL ON YOUR BODY.                    Do NOT bring any valuables.  If you came to Pre-Admit testing then you will not need license,                     insurance card or credit card.  If you will be staying overnight, please either leave your things in                     the car or have your family be responsible for these items.                     [facility] IS NOT RESPONSIBLE FOR BELONGINGS OR VALUABLES.  ___X__ 8. DO NOT wear contact lenses on surgery day.  You may not have dentures,                     Hearing aides, contacts or glasses in the operating room. These items can be                    Placed in the Recovery Room to receive immediately after surgery.  __x___ 9. IF YOU ARE SCHEDULED TO GO HOME ON THE SAME DAY, YOU MUST                   Have someone to drive you home and to stay with you  for the first 24 hours.                    Have an arrangement prior to arriving on surgery day.  ___x__ 10. Take the following medications on the morning of surgery with a  sip of water:                               1.  GABAPENTIN                     2.                     3.                     4.                     5.                     6.  _____ 11.  Follow any instructions provided to you by your surgeon.                        Such as enema, clear liquid bowel prep  __X__  12. STOP  ASPIRIN AS OF:  October 7TH                       THIS INCLUDES BC POWDERS / GOODIES POWDER  __x___ 13. STOP Anti-inflammatories as of: October 7TH                      This includes IBUPROFEN / MOTRIN / ADVIL / ALEVE/ NAPROXYN / MOBIC                    YOU MAY TAKE TYLENOL ANY TIME PRIOR TO SURGERY.  _____ 14.  Stop supplements until after surgery.                     This includes:                 You may continue taking Vitamin B12 / Vitamin D3 but do not take on the morning of surgery.  _____ 15. Bring your CPAP machine into preop with you on the morning of surgery.

## 2019-08-14 NOTE — Progress Notes (Signed)
[place]   [phone] [phone]   Clinic New Consult Note   Patient Care Team: Ricardo Jericho, NP as PCP - General (Family Medicine) Rico Junker, RN as Registered Nurse  Date of Service:  08/18/2019   CHIEF COMPLAINTS/PURPOSE OF CONSULTATION:  Newly diagnosed invasive breast cancer   REFERRING PHYSICIAN:  Dr. Tasia Catchings  Oncology History Overview Note  Cancer Staging Malignant neoplasm of lower-inner quadrant of right breast of female, estrogen receptor positive ([place]) Staging form: Breast, AJCC 8th Edition - Clinical stage from 07/26/2019: Stage IIB (cT2, cN1, cM0, G3, ER+, PR+, HER2-) - Unsigned    Malignant neoplasm of lower-inner quadrant of right breast of female, estrogen receptor positive ([place])  07/20/2019 Mammogram   Mammogram 07/20/19  IMPRESSION: Suspicious palpable right breast mass 3.4 x 2.5 x 3.4 cm 5 o'clock position 3 cm from nipple.   Suspicious 1.6cm cortically thickened right axillary lymph node.   07/26/2019 Initial Diagnosis   DIAGNOSIS: 07/26/19 A. RIGHT BREAST, 5:00, 3CMFN; ULTRASOUND-GUIDED NEEDLE CORE BIOPSY:  - INVASIVE MAMMARY CARCINOMA, NO SPECIAL TYPE.    07/26/2019 Receptors her2   BREAST BIOMARKER TESTS  Estrogen Receptor (ER) Status: POSITIVE                       Percentage of cells with nuclear positivity: 51-90%                       Average intensity of staining: Strong   Progesterone Receptor (PgR) Status: POSITIVE                       Percentage of cells with nuclear positivity:  Greater than 90%                       Average intensity of staining: Strong   HER2 (by immunohistochemistry): NEGATIVE (Score 1+)    08/05/2019 Initial Diagnosis   Invasive carcinoma of breast ([place])   08/17/2019 Surgery   INSERTION PORT-A-CATH by Dr. Lysle Pearl 08/17/19    08/18/2019 -  Chemotherapy   AC q2weeks for 4 cycles starting 08/18/19 followed by weekly Taxol for 12 weeks       HISTORY OF PRESENTING  ILLNESS:  Jessica [name] 43 y.o. female is a here because of newly diagnosed invasive breast cancer. The patient was referred by Dr Tasia Catchings to [place] for chemotherapy with DigniCap. The patient presents to the clinic today alone.  She felt the right lump on 07/06/19, she saw her physician, proceeding with mammogram and biopsy which showed invasive cancer. She was seen by Dr. Tasia Catchings. She was recommended to start AC-T and wanted to use Dignicap during infusion as the other clinic does not have it. She will continue her chemo her until the other clinic has [place] and equipment.   She takes Gabapentin for bursitis and Sciatica. She is on oral iron and pain medication. Today she notes recent skin erythema around her breast lump.     REVIEW OF SYSTEMS:   Constitutional: Denies fevers, chills or abnormal night sweats Eyes: Denies blurriness of vision, double vision or watery eyes Ears, nose, mouth, throat, and face: Denies mucositis or sore throat Respiratory: Denies cough, dyspnea or wheezes Cardiovascular: Denies palpitation, chest discomfort or lower extremity swelling Gastrointestinal:  Denies nausea, heartburn or change in bowel habits Skin: Denies abnormal skin rashes Lymphatics: Denies new lymphadenopathy or easy bruising Neurological:Denies numbness, tingling or new  weaknesses Behavioral/Psych: Mood is stable, no new changes  All other systems were reviewed with the patient and are negative.   MEDICAL HISTORY:  Past Medical History:  Diagnosis Date   Cancer [facility])    breast cancer   Frequency    Kidney stones    Obesity    Right flank pain     SURGICAL HISTORY: Past Surgical History:  Procedure Laterality Date   CESAREAN SECTION     x 2   CHOLECYSTECTOMY     DILATION AND CURETTAGE OF UTERUS     PORTACATH PLACEMENT Left 08/17/2019   Procedure: INSERTION PORT-A-CATH;  Surgeon: Benjamine Sprague, DO;  Location: [facility] ORS;  Service: General;  Laterality: Left;   TUBAL LIGATION       SOCIAL HISTORY: Social History   Socioeconomic History   Marital status: Single    Spouse name: Not on file   Number of children: Not on file   Years of education: Not on file   Highest education level: Not on file  Occupational History   Not on file  Social Needs   Financial resource strain: Not on file   Food insecurity    Worry: Not on file    Inability: Not on file   Transportation needs    Medical: Not on file    Non-medical: Not on file  Tobacco Use   Smoking status: Never Smoker   Smokeless tobacco: Never Used  Substance and Sexual Activity   Alcohol use: No   Drug use: No   Sexual activity: Yes  Lifestyle   Physical activity    Days per week: Not on file    Minutes per session: Not on file   Stress: Not on file  Relationships   Social connections    Talks on phone: Not on file    Gets together: Not on file    Attends religious service: Not on file    Active member of club or organization: Not on file    Attends meetings of clubs or organizations: Not on file    Relationship status: Not on file   Intimate partner violence    Fear of current or ex partner: Not on file    Emotionally abused: Not on file    Physically abused: Not on file    Forced sexual activity: Not on file  Other Topics Concern   Not on file  Social History Narrative   Not on file    FAMILY HISTORY: Family History  Problem Relation Age of Onset   Kidney Stones Other    Cervical cancer Mother    Kidney Stones Mother    Other Sister    Breast cancer Neg Hx     ALLERGIES:  has No Known Allergies.  MEDICATIONS:  Current Outpatient Medications  Medication Sig Dispense Refill   acetaminophen (TYLENOL) 325 MG tablet Take 2 tablets (650 mg total) by mouth every 8 (eight) hours as needed for mild pain. 40 tablet 0   dexamethasone (DECADRON) 4 MG tablet Take 2 tablets by mouth once a day on the day after chemotherapy and then take 2 tablets two times a day  for 2 days. Take with food. 30 tablet 1   ferrous sulfate 325 (65 FE) MG EC tablet Take 1 tablet (325 mg total) by mouth 2 (two) times daily with a meal. 60 tablet 1   gabapentin (NEURONTIN) 600 MG tablet Take 600 mg by mouth 3 (three) times daily.     HYDROcodone-acetaminophen (NORCO) 5-325  MG tablet Take 1 tablet by mouth every 6 (six) hours as needed for up to 3 days for moderate pain. 12 tablet 0   lidocaine-prilocaine (EMLA) cream Apply to affected area once 30 g 3   meloxicam (MOBIC) 15 MG tablet Take 15 mg by mouth daily.     ondansetron (ZOFRAN) 8 MG tablet Take 1 tablet (8 mg total) by mouth 2 (two) times daily as needed. Start on the third day after chemotherapy. 30 tablet 1   prochlorperazine (COMPAZINE) 10 MG tablet Take 1 tablet (10 mg total) by mouth every 6 (six) hours as needed (Nausea or vomiting). 30 tablet 1   No current facility-administered medications for this visit.    Facility-Administered Medications Ordered in Other Visits  Medication Dose Route Frequency Provider Last Rate Last Dose   sodium chloride flush (NS) 0.9 % injection 10 mL  10 mL Intracatheter PRN Truitt Merle, MD   10 mL at 08/18/19 1254    PHYSICAL EXAMINATION: ECOG PERFORMANCE STATUS: 0 - Asymptomatic  Vitals:   08/18/19 0811  BP: 128/60  Pulse: 87  Resp: 18  Temp: 98.5 F (36.9 C)  SpO2: 99%   Filed Weights   08/18/19 0811  Weight: 220 lb 8 oz (100 kg)    GENERAL:alert, no distress and comfortable SKIN: skin color, texture, turgor are normal, no rashes or significant lesions EYES: normal, Conjunctiva are pink and non-injected, sclera clear  NECK: supple, thyroid normal size, non-tender, without nodularity LYMPH:  no palpable lymphadenopathy in the cervical, axillary  LUNGS: clear to auscultation and percussion with normal breathing effort HEART: regular rate & rhythm and no murmurs and no lower extremity edema ABDOMEN:abdomen soft, non-tender and normal bowel  sounds Musculoskeletal:no cyanosis of digits and no clubbing  NEURO: alert & oriented x 3 with fluent speech, no focal motor/sensory deficits BREAST: (+) Palpable 5x3.5cm mass at 3:00 position 2cm from nipple in right breast (+) 6x6cm area of skin erythema around tumor (+) Palpable 2x1.5cm right axilla Lymph node. Left Breast exam benign.      LABORATORY DATA:  I have reviewed the data as listed CBC Latest Ref Rng & Units 08/18/2019 08/03/2019 09/14/2015  WBC 4.0 - 10.5 K/uL 20.1(H) 10.2 9.7  Hemoglobin 12.0 - 15.0 g/dL 11.3(L) 12.0 10.8(L)  Hematocrit 36.0 - 46.0 % 35.5(L) 36.8 33.8(L)  Platelets 150 - 400 K/uL 452(H) 375 353    CMP Latest Ref Rng & Units 08/18/2019 08/03/2019 07/25/2019  Glucose 70 - 99 mg/dL 110(H) 93 -  BUN 6 - 20 mg/dL '13 10 6  ' Creatinine 0.44 - 1.00 mg/dL 0.75 0.67 0.79  Sodium 135 - 145 mmol/L 141 136 -  Potassium 3.5 - 5.1 mmol/L 3.7 3.9 -  Chloride 98 - 111 mmol/L 106 105 -  CO2 22 - 32 mmol/L 24 23 -  Calcium 8.9 - 10.3 mg/dL 9.1 8.9 -  Total Protein 6.5 - 8.1 g/dL 7.3 8.0 -  Total Bilirubin 0.3 - 1.2 mg/dL 0.3 0.3 -  Alkaline Phos 38 - 126 U/L 77 61 -  AST 15 - 41 U/L 14(L) 16 -  ALT 0 - 44 U/L 18 11 -     RADIOGRAPHIC STUDIES: I have personally reviewed the radiological images as listed and agreed with the findings in the report. Dg Chest 1 View  Result Date: 08/17/2019 CLINICAL DATA:  Post port placement, breast cancer EXAM: CHEST  1 VIEW COMPARISON:  01/20/2010 FINDINGS: Interval placement of LEFT-sided power port, tip overlying the level of superior  vena cava. Shallow lung inflation. Heart size is normal. No pulmonary edema. No pneumothorax following the procedure. IMPRESSION: Interval placement of LEFT-sided power port. No adverse features. Electronically Signed   By: Nolon Nations M.D.   On: 08/17/2019 09:17   Dg Abd 1 View  Result Date: 07/25/2019 CLINICAL DATA:  Flank pain EXAM: ABDOMEN - 1 VIEW COMPARISON:  None. FINDINGS: The bowel gas  pattern is normal. Surgical clips seen in the right upper quadrant. Moderate amount of colonic stool. No radio-opaque calculi or other significant radiographic abnormality are seen. IMPRESSION: No calculi seen overlying the renal shadows. Electronically Signed   By: Prudencio Pair M.D.   On: 07/25/2019 14:16   Nm Cardiac Muga Rest  Result Date: 08/09/2019 CLINICAL DATA:  Invasive breast cancer, pre cardiotoxic chemotherapy EXAM: NUCLEAR MEDICINE CARDIAC BLOOD POOL IMAGING (MUGA) TECHNIQUE: Cardiac multi-gated acquisition was performed at rest following intravenous injection of Tc-10mlabeled red blood cells. RADIOPHARMACEUTICALS:  19.95 mCi Tc-959mertechnetate in-vitro labeled red blood cells IV COMPARISON:  None FINDINGS: Calculated LEFT ventricular ejection fraction is 59.5%, normal. Study was obtained at a cardiac rate of 79 bpm. Patient was rhythmic during imaging. Cine analysis of the LEFT ventricle in 3 projections demonstrates normal LV wall motion. IMPRESSION: Normal LEFT ventricular ejection fraction of 59.5%. Normal LV wall motion. Electronically Signed   By: MaLavonia Dana.D.   On: 08/09/2019 16:18   Dg C-arm 1-60 Min-no Report  Result Date: 08/17/2019 Fluoroscopy was utilized by the requesting physician.  No radiographic interpretation.   [employer] Uni Right Inc Axilla  Result Date: 07/20/2019 CLINICAL DATA:  Patient presents for palpable abnormality within the right breast. EXAM: DIGITAL DIAGNOSTIC BILATERAL MAMMOGRAM WITH CAD AND TOMO ULTRASOUND RIGHT BREAST COMPARISON:  Previous exam(s). ACR Breast Density Category c: The breast tissue is heterogeneously dense, which may obscure small masses. FINDINGS: There is a lobular mass within the lower inner right breast middle depth, further evaluated with spot tangential view at the site of palpable concern. No additional concerning masses, calcifications or distortion identified within either breast. Mammographic images were processed with  CAD. Targeted ultrasound is performed, showing a 3.4 x 2.5 x 3.4 cm hypoechoic irregular mass right breast 5 o'clock position 3 cm from nipple at the site of palpable concern. There is a single cortically thickened abnormal appearing right axillary node with a cortical thickness of 1.6 cm. IMPRESSION: Suspicious palpable right breast mass. Suspicious cortically thickened right axillary lymph node. RECOMMENDATION: Ultrasound-guided core needle biopsy of the right breast mass 5 o'clock position. Ultrasound-guided core needle biopsy of the cortically thickened right axillary lymph node. I have discussed the findings and recommendations with the patient. If applicable, a reminder letter will be sent to the patient regarding the next appointment. BI-RADS CATEGORY  5: Highly suggestive of malignancy. Electronically Signed   By: DrLovey Newcomer.D.   On: 07/20/2019 15:47   Mm Diag Breast Tomo Bilateral  Result Date: 07/20/2019 CLINICAL DATA:  Patient presents for palpable abnormality within the right breast. EXAM: DIGITAL DIAGNOSTIC BILATERAL MAMMOGRAM WITH CAD AND TOMO ULTRASOUND RIGHT BREAST COMPARISON:  Previous exam(s). ACR Breast Density Category c: The breast tissue is heterogeneously dense, which may obscure small masses. FINDINGS: There is a lobular mass within the lower inner right breast middle depth, further evaluated with spot tangential view at the site of palpable concern. No additional concerning masses, calcifications or distortion identified within either breast. Mammographic images were processed with CAD. Targeted ultrasound is performed, showing a  3.4 x 2.5 x 3.4 cm hypoechoic irregular mass right breast 5 o'clock position 3 cm from nipple at the site of palpable concern. There is a single cortically thickened abnormal appearing right axillary node with a cortical thickness of 1.6 cm. IMPRESSION: Suspicious palpable right breast mass. Suspicious cortically thickened right axillary lymph node.  RECOMMENDATION: Ultrasound-guided core needle biopsy of the right breast mass 5 o'clock position. Ultrasound-guided core needle biopsy of the cortically thickened right axillary lymph node. I have discussed the findings and recommendations with the patient. If applicable, a reminder letter will be sent to the patient regarding the next appointment. BI-RADS CATEGORY  5: Highly suggestive of malignancy. Electronically Signed   By: Lovey Newcomer M.D.   On: 07/20/2019 15:47   Mm Clip Placement Right  Result Date: 07/26/2019 CLINICAL DATA:  Post biopsy mammogram of the right breast for clip placement. EXAM: DIAGNOSTIC RIGHT MAMMOGRAM POST ULTRASOUND BIOPSY COMPARISON:  Previous exam(s). FINDINGS: Mammographic images were obtained following ultrasound guided biopsy of a mass in the right breast and a right axillary lymph node. The heart shaped biopsy marking clip is well positioned within the mass in the lower-inner right breast. The spiral shaped HydroMARK clip is well positioned in the right axillary lymph node. IMPRESSION: 1. Appropriate positioning of the heart shaped biopsy marking clip in the mass in the lower-inner right breast. 2. Appropriate positioning of the spiral shaped HydroMARK clip in the right axillary lymph node. Final Assessment: Post Procedure Mammograms for Marker Placement Electronically Signed   By: Ammie Ferrier M.D.   On: 07/26/2019 09:13   [place] Rt Breast Bx W Loc Dev 1st Lesion Img Bx Spec US Guide  Addendum Date: 07/31/2019   ADDENDUM REPORT: 07/31/2019 11:24 ADDENDUM: Surgical consultation was arranged for patient to see DR. Benjamine Sprague on 07/31/2019. Addendum by Electa Sniff RN on 07/31/2019. Electronically Signed   By: Ammie Ferrier M.D.   On: 07/31/2019 11:24   Addendum Date: 07/28/2019   ADDENDUM REPORT: 07/28/2019 13:08 ADDENDUM: PATHOLOGY revealed: A. RIGHT BREAST, 5:00, 3CMFN; ULTRASOUND-GUIDED NEEDLE CORE BIOPSY: - INVASIVE MAMMARY CARCINOMA, NO SPECIAL TYPE. 11 mm in this  sample. Grade 3. Ductal carcinoma in situ: Not identified. Lymphovascular invasion: Not identified. Comment: The definitive grade will be assigned on the excisional specimen. B. LYMPH NODE, RIGHT AXILLARY; ULTRASOUND-GUIDED NEEDLE CORE BIOPSY: - POSITIVE FOR METASTATIC CARCINOMA, 10 MM IN THIS SAMPLE. Pathology results are CONCORDANT with imaging findings, per Dr. Ammie Ferrier. Pathology results were discussed with patient via telephone. The patient reported doing well after the biopsy with tenderness at the site. Post biopsy care instructions were reviewed and questions were answered. The patient was encouraged to call [facility] for any additional concerns. Recommendation: Surgical referral. Request for surgical referral was relayed to nurse navigators at [facility] by Electa Sniff RN on 07/28/2019. Addendum by Electa Sniff RN on 07/28/2019. Electronically Signed   By: Ammie Ferrier M.D.   On: 07/28/2019 13:08   Result Date: 07/31/2019 CLINICAL DATA:  43 year old female presenting for ultrasound-guided biopsy of a right breast mass and right axillary lymph node. EXAM: ULTRASOUND GUIDED RIGHT BREAST CORE NEEDLE BIOPSY COMPARISON:  Previous exam(s). FINDINGS: I met with the patient and we discussed the procedure of ultrasound-guided biopsy, including benefits and alternatives. We discussed the high likelihood of a successful procedure. We discussed the risks of the procedure, including infection, bleeding, tissue injury, clip migration, and inadequate sampling. Informed written consent was given. The usual time-out protocol was performed  immediately prior to the procedure. #1 Lesion quadrant: Lower-inner quadrant Using sterile technique and 1% Lidocaine as local anesthetic, under direct ultrasound visualization, a 14 gauge spring-loaded device was used to perform biopsy of a mass in the right breast at 5 o'clock using an inferior approach. At the conclusion of the procedure a  heart shaped tissue marker clip was deployed into the biopsy cavity. -------------------------------------------------------------------------------------------------------------------------------------------- #2 Lesion quadrant: Right axilla Using sterile technique and 1% Lidocaine as local anesthetic, under direct ultrasound visualization, a 14 gauge spring-loaded device was used to perform biopsy of a right axillary lymph node using an inferior approach. At the conclusion of the procedure a HydroMARK shape 3 tissue marker clip was deployed into the biopsy cavity. Follow up 2 view mammogram was performed and dictated separately. IMPRESSION: 1. Ultrasound guided biopsy of a right breast mass at 5 o'clock. No apparent complications. 2. Ultrasound guided biopsy of a right axillary lymph node. No apparent complications. Electronically Signed: By: Ammie Ferrier M.D. On: 07/26/2019 09:12   [place] Rt Breast Bx W Loc Dev Ea Add Lesion Img Bx Spec US Guide  Addendum Date: 07/31/2019   ADDENDUM REPORT: 07/31/2019 11:24 ADDENDUM: Surgical consultation was arranged for patient to see DR. Benjamine Sprague on 07/31/2019. Addendum by Electa Sniff RN on 07/31/2019. Electronically Signed   By: Ammie Ferrier M.D.   On: 07/31/2019 11:24   Addendum Date: 07/28/2019   ADDENDUM REPORT: 07/28/2019 13:08 ADDENDUM: PATHOLOGY revealed: A. RIGHT BREAST, 5:00, 3CMFN; ULTRASOUND-GUIDED NEEDLE CORE BIOPSY: - INVASIVE MAMMARY CARCINOMA, NO SPECIAL TYPE. 11 mm in this sample. Grade 3. Ductal carcinoma in situ: Not identified. Lymphovascular invasion: Not identified. Comment: The definitive grade will be assigned on the excisional specimen. B. LYMPH NODE, RIGHT AXILLARY; ULTRASOUND-GUIDED NEEDLE CORE BIOPSY: - POSITIVE FOR METASTATIC CARCINOMA, 10 MM IN THIS SAMPLE. Pathology results are CONCORDANT with imaging findings, per Dr. Ammie Ferrier. Pathology results were discussed with patient via telephone. The patient reported doing well after  the biopsy with tenderness at the site. Post biopsy care instructions were reviewed and questions were answered. The patient was encouraged to call [facility] for any additional concerns. Recommendation: Surgical referral. Request for surgical referral was relayed to nurse navigators at [facility] by Electa Sniff RN on 07/28/2019. Addendum by Electa Sniff RN on 07/28/2019. Electronically Signed   By: Ammie Ferrier M.D.   On: 07/28/2019 13:08   Result Date: 07/31/2019 CLINICAL DATA:  43 year old female presenting for ultrasound-guided biopsy of a right breast mass and right axillary lymph node. EXAM: ULTRASOUND GUIDED RIGHT BREAST CORE NEEDLE BIOPSY COMPARISON:  Previous exam(s). FINDINGS: I met with the patient and we discussed the procedure of ultrasound-guided biopsy, including benefits and alternatives. We discussed the high likelihood of a successful procedure. We discussed the risks of the procedure, including infection, bleeding, tissue injury, clip migration, and inadequate sampling. Informed written consent was given. The usual time-out protocol was performed immediately prior to the procedure. #1 Lesion quadrant: Lower-inner quadrant Using sterile technique and 1% Lidocaine as local anesthetic, under direct ultrasound visualization, a 14 gauge spring-loaded device was used to perform biopsy of a mass in the right breast at 5 o'clock using an inferior approach. At the conclusion of the procedure a heart shaped tissue marker clip was deployed into the biopsy cavity. -------------------------------------------------------------------------------------------------------------------------------------------- #2 Lesion quadrant: Right axilla Using sterile technique and 1% Lidocaine as local anesthetic, under direct ultrasound visualization, a 14 gauge spring-loaded device was used to perform biopsy of a right  axillary lymph node using an inferior approach. At the conclusion of the  procedure a HydroMARK shape 3 tissue marker clip was deployed into the biopsy cavity. Follow up 2 view mammogram was performed and dictated separately. IMPRESSION: 1. Ultrasound guided biopsy of a right breast mass at 5 o'clock. No apparent complications. 2. Ultrasound guided biopsy of a right axillary lymph node. No apparent complications. Electronically Signed: By: Ammie Ferrier M.D. On: 07/26/2019 09:12    ASSESSMENT & PLAN:  Jessica [name] is a 43 y.o.  female with   1. Invasive right breast cancer, Stage IIb, c(T2N1M0), ER/PR+, HER2-, Grade III -She was recently diagnosed in 07/2019 with 3.4cm grade III invasive mammary carcinoma, node positive. With this tumor size and locally advanced high grade disease she has a higher risk of cancer recurrence after curative surgery.  -Neoadjuvant chemo with dose dense Adriamycin and Cytoxan q2weeks for 4 cycles followed by weekly Taxol for 12 weeks was recommended by Dr. Tasia Catchings before proceeding with surgery. I agree with neoadjuvant chemo. She opted to use Dignicap due to side effect of hair loss. Dr. Collie Siad facility does not have [place] equipment so she will proceed with chemo here until they do.  -She had PAC placed on 08/17/19 and completed chemo education class.  -Physical exam shows 5cm right breast mass with moderate surrounding skin erythema and 2cm palpable right Lymph node. Will monitor her response to chemo clinically with exams. If not getting smaller will scan during treatment.  -Labs reviewed, CBC and CMP WNL except WBC 20.1, Hg 11.3, PLT 452K, ANC 14.9. CA 27.29 still pending. Will proceed with C1 AC today and Udenyca injection tomorrow.  -I reviewed chemo side effects again, especially risk of neutropenic fever and infection, nausea management with Zofran and Compazine (she has dexamethasone but will not use for now). I encouraged her to notify our clinic with any unexpected or significant side effects or fever of 100.81F or higher.  -she will  have Toxicity checkup with Dr. Tasia Catchings next week and f/u with me in 2 weeks before cycle 2    2. Iron deficient anemia  -She will restart oral iron. I encouraged her to increase to 2 times a day.  -Hg at 11.3 today (08/18/19). Will monitor on chemo.  -If oral iron not sufficient, will consider IV iron.   PLAN:  -Labs reviewed and adequate to proceed with C1 AC today with Udenyca tomorrow  -f/u with Dr. Tasia Catchings next week for toxicity checkup -Lab, flush, f/u and AC in 2 weeks    No orders of the defined types were placed in this encounter.   All questions were answered. The patient knows to call the clinic with any problems, questions or concerns. I spent 20 minutes counseling the patient face to face. The total time spent in the appointment was 25 minutes and more than 50% was on counseling.     Truitt Merle, MD 08/18/2019 1:28 PM  I, Joslyn Devon, am acting as scribe for Truitt Merle, MD.   I have reviewed the above documentation for accuracy and completeness, and I agree with the above.

## 2019-08-14 NOTE — Progress Notes (Signed)
PSN spoke with patient today about her financial concerns.  PSN gave her telephone number to apply for financial assistance through [facility]. Patient will receive assistance with monthly expenses from the [facility] since she will not be able to work during treatment.

## 2019-08-17 ENCOUNTER — Ambulatory Visit
Admission: RE | Admit: 2019-08-17 | Discharge: 2019-08-17 | Disposition: A | Discharge status: Home / Self Care | Payer: BC Managed Care – PPO | Attending: Surgery | Admitting: Surgery

## 2019-08-17 ENCOUNTER — Other Ambulatory Visit: Payer: Self-pay | Admitting: Hematology

## 2019-08-17 ENCOUNTER — Ambulatory Visit: Payer: BC Managed Care – PPO

## 2019-08-17 ENCOUNTER — Ambulatory Visit: Payer: BC Managed Care – PPO | Admitting: Anesthesiology

## 2019-08-17 ENCOUNTER — Other Ambulatory Visit: Payer: Self-pay

## 2019-08-17 ENCOUNTER — Encounter
Admission: RE | Disposition: A | Discharge status: Home / Self Care | Payer: Self-pay | Source: Home / Self Care | Attending: Surgery

## 2019-08-17 DIAGNOSIS — Z79899 Other long term (current) drug therapy: Secondary | ICD-10-CM | POA: Diagnosis not present

## 2019-08-17 DIAGNOSIS — E669 Obesity, unspecified: Secondary | ICD-10-CM | POA: Diagnosis not present

## 2019-08-17 DIAGNOSIS — C773 Secondary and unspecified malignant neoplasm of axilla and upper limb lymph nodes: Secondary | ICD-10-CM | POA: Insufficient documentation

## 2019-08-17 DIAGNOSIS — M1990 Unspecified osteoarthritis, unspecified site: Secondary | ICD-10-CM | POA: Diagnosis not present

## 2019-08-17 DIAGNOSIS — C50919 Malignant neoplasm of unspecified site of unspecified female breast: Secondary | ICD-10-CM

## 2019-08-17 DIAGNOSIS — Z87442 Personal history of urinary calculi: Secondary | ICD-10-CM | POA: Diagnosis not present

## 2019-08-17 DIAGNOSIS — Z87891 Personal history of nicotine dependence: Secondary | ICD-10-CM | POA: Insufficient documentation

## 2019-08-17 DIAGNOSIS — C50911 Malignant neoplasm of unspecified site of right female breast: Secondary | ICD-10-CM | POA: Diagnosis not present

## 2019-08-17 DIAGNOSIS — Z793 Long term (current) use of hormonal contraceptives: Secondary | ICD-10-CM | POA: Insufficient documentation

## 2019-08-17 DIAGNOSIS — Z6838 Body mass index (BMI) 38.0-38.9, adult: Secondary | ICD-10-CM | POA: Insufficient documentation

## 2019-08-17 DIAGNOSIS — Z95828 Presence of other vascular implants and grafts: Secondary | ICD-10-CM

## 2019-08-17 HISTORY — PX: PORTACATH PLACEMENT: SHX2246

## 2019-08-17 LAB — POCT PREGNANCY, URINE: Preg Test, Ur: NEGATIVE

## 2019-08-17 SURGERY — INSERTION, TUNNELED CENTRAL VENOUS DEVICE, WITH PORT
Anesthesia: General | Site: Chest | Laterality: Left

## 2019-08-17 MED ORDER — LACTATED RINGERS IV SOLN
INTRAVENOUS | Status: DC | PRN
  Administered 2019-08-17: 07:00:00 via INTRAVENOUS

## 2019-08-17 MED ORDER — PROPOFOL 10 MG/ML IV BOLUS
INTRAVENOUS | Status: DC | PRN
  Administered 2019-08-17: 200 mg via INTRAVENOUS

## 2019-08-17 MED ORDER — HYDROCODONE-ACETAMINOPHEN 5-325 MG PO TABS: 1.0000 | ORAL_TABLET | Freq: Four times a day (QID) | ORAL | 0 refills | Status: AC | PRN

## 2019-08-17 MED ORDER — FAMOTIDINE 20 MG PO TABS
ORAL_TABLET | ORAL | Status: AC
  Filled 2019-08-17: qty 1

## 2019-08-17 MED ORDER — MIDAZOLAM HCL 2 MG/2ML IJ SOLN
INTRAMUSCULAR | Status: DC | PRN
  Administered 2019-08-17: 2 mg via INTRAVENOUS

## 2019-08-17 MED ORDER — OXYCODONE HCL 5 MG/5ML PO SOLN: 5.0000 mg | Freq: Once | ORAL | Status: DC | PRN

## 2019-08-17 MED ORDER — CEFAZOLIN SODIUM-DEXTROSE 2-4 GM/100ML-% IV SOLN
INTRAVENOUS | Status: AC
  Filled 2019-08-17: qty 100

## 2019-08-17 MED ORDER — LACTATED RINGERS IV SOLN
INTRAVENOUS | Status: DC
  Administered 2019-08-17: 07:00:00 via INTRAVENOUS

## 2019-08-17 MED ORDER — LIDOCAINE-EPINEPHRINE 1 %-1:100000 IJ SOLN
INTRAMUSCULAR | Status: DC | PRN
  Administered 2019-08-17: 3 mL

## 2019-08-17 MED ORDER — SODIUM CHLORIDE FLUSH 0.9 % IV SOLN
INTRAVENOUS | Status: AC
  Filled 2019-08-17: qty 10

## 2019-08-17 MED ORDER — LIDOCAINE HCL (CARDIAC) PF 100 MG/5ML IV SOSY
PREFILLED_SYRINGE | INTRAVENOUS | Status: DC | PRN
  Administered 2019-08-17: 100 mg via INTRAVENOUS

## 2019-08-17 MED ORDER — LIDOCAINE-EPINEPHRINE 1 %-1:100000 IJ SOLN
INTRAMUSCULAR | Status: AC
  Filled 2019-08-17: qty 1

## 2019-08-17 MED ORDER — HEPARIN SOD (PORK) LOCK FLUSH 100 UNIT/ML IV SOLN
INTRAVENOUS | Status: AC
  Filled 2019-08-17: qty 5

## 2019-08-17 MED ORDER — LIDOCAINE HCL (PF) 1 % IJ SOLN
INTRAMUSCULAR | Status: AC
  Filled 2019-08-17: qty 30

## 2019-08-17 MED ORDER — PROMETHAZINE HCL 25 MG/ML IJ SOLN
INTRAMUSCULAR | Status: AC
  Administered 2019-08-17: 6.25 mg via INTRAVENOUS
  Filled 2019-08-17: qty 1

## 2019-08-17 MED ORDER — OXYCODONE HCL 5 MG PO TABS: 5.0000 mg | ORAL_TABLET | Freq: Once | ORAL | Status: DC | PRN

## 2019-08-17 MED ORDER — ONDANSETRON HCL 4 MG/2ML IJ SOLN
INTRAMUSCULAR | Status: DC | PRN
  Administered 2019-08-17: 4 mg via INTRAVENOUS

## 2019-08-17 MED ORDER — DEXMEDETOMIDINE HCL 200 MCG/2ML IV SOLN
INTRAVENOUS | Status: DC | PRN
  Administered 2019-08-17: 16 ug via INTRAVENOUS

## 2019-08-17 MED ORDER — HEPARIN SOD (PORK) LOCK FLUSH 100 UNIT/ML IV SOLN
INTRAVENOUS | Status: DC | PRN
  Administered 2019-08-17: 450 [IU] via INTRAVENOUS

## 2019-08-17 MED ORDER — ACETAMINOPHEN 325 MG PO TABS: 650.0000 mg | ORAL_TABLET | Freq: Three times a day (TID) | ORAL | 0 refills | Status: AC | PRN

## 2019-08-17 MED ORDER — FAMOTIDINE 20 MG PO TABS
20.0000 mg | ORAL_TABLET | Freq: Once | ORAL | Status: AC
  Administered 2019-08-17: 20 mg via ORAL

## 2019-08-17 MED ORDER — DEXAMETHASONE SODIUM PHOSPHATE 10 MG/ML IJ SOLN
INTRAMUSCULAR | Status: DC | PRN
  Administered 2019-08-17: 10 mg via INTRAVENOUS

## 2019-08-17 MED ORDER — FENTANYL CITRATE (PF) 100 MCG/2ML IJ SOLN
INTRAMUSCULAR | Status: AC
  Administered 2019-08-17: 25 ug via INTRAVENOUS
  Filled 2019-08-17: qty 2

## 2019-08-17 MED ORDER — BUPIVACAINE HCL (PF) 0.5 % IJ SOLN
INTRAMUSCULAR | Status: DC | PRN
  Administered 2019-08-17: 3 mL

## 2019-08-17 MED ORDER — CHLORHEXIDINE GLUCONATE CLOTH 2 % EX PADS: 6.0000 | MEDICATED_PAD | Freq: Once | CUTANEOUS | Status: DC

## 2019-08-17 MED ORDER — CEFAZOLIN SODIUM-DEXTROSE 2-4 GM/100ML-% IV SOLN
2.0000 g | INTRAVENOUS | Status: AC
  Administered 2019-08-17: 2 g via INTRAVENOUS

## 2019-08-17 MED ORDER — PROPOFOL 10 MG/ML IV BOLUS
INTRAVENOUS | Status: AC
  Filled 2019-08-17: qty 20

## 2019-08-17 MED ORDER — MIDAZOLAM HCL 2 MG/2ML IJ SOLN
INTRAMUSCULAR | Status: AC
  Filled 2019-08-17: qty 2

## 2019-08-17 MED ORDER — PROMETHAZINE HCL 25 MG/ML IJ SOLN
6.2500 mg | INTRAMUSCULAR | Status: AC
  Administered 2019-08-17: 09:00:00 6.25 mg via INTRAVENOUS

## 2019-08-17 MED ORDER — HEPARIN SODIUM (PORCINE) 5000 UNIT/ML IJ SOLN
INTRAMUSCULAR | Status: AC
  Filled 2019-08-17: qty 1

## 2019-08-17 MED ORDER — FENTANYL CITRATE (PF) 100 MCG/2ML IJ SOLN
INTRAMUSCULAR | Status: AC
  Filled 2019-08-17: qty 2

## 2019-08-17 MED ORDER — SODIUM CHLORIDE (PF) 0.9 % IJ SOLN
INTRAMUSCULAR | Status: AC
  Filled 2019-08-17: qty 50

## 2019-08-17 MED ORDER — FENTANYL CITRATE (PF) 100 MCG/2ML IJ SOLN
INTRAMUSCULAR | Status: DC | PRN
  Administered 2019-08-17 (×2): 50 ug via INTRAVENOUS

## 2019-08-17 MED ORDER — FENTANYL CITRATE (PF) 100 MCG/2ML IJ SOLN
25.0000 ug | INTRAMUSCULAR | Status: DC | PRN
  Administered 2019-08-17 (×3): 25 ug via INTRAVENOUS

## 2019-08-17 MED ORDER — BUPIVACAINE HCL (PF) 0.5 % IJ SOLN
INTRAMUSCULAR | Status: AC
  Filled 2019-08-17: qty 30

## 2019-08-17 SURGICAL SUPPLY — 37 items
BAG DECANTER FOR FLEXI CONT (MISCELLANEOUS) ×2 IMPLANT
BENZOIN TINCTURE PRP APPL 2/3 (GAUZE/BANDAGES/DRESSINGS) ×2 IMPLANT
BLADE SURG 11 STRL SS SAFETY (MISCELLANEOUS) IMPLANT
BOOT SUTURE AID YELLOW STND (SUTURE) ×2 IMPLANT
CANISTER SUCT 1200ML W/VALVE (MISCELLANEOUS) ×2 IMPLANT
CHLORAPREP W/TINT 26 (MISCELLANEOUS) ×2 IMPLANT
COVER LIGHT HANDLE STERIS (MISCELLANEOUS) ×4 IMPLANT
COVER WAND RF STERILE (DRAPES) ×2 IMPLANT
DECANTER SPIKE VIAL GLASS SM (MISCELLANEOUS) ×4 IMPLANT
DERMABOND ADVANCED (GAUZE/BANDAGES/DRESSINGS) ×2
DERMABOND ADVANCED .7 DNX12 (GAUZE/BANDAGES/DRESSINGS) ×2 IMPLANT
DRAPE C-ARM XRAY 36X54 (DRAPES) ×2 IMPLANT
DRAPE LAPAROTOMY TRNSV 106X77 (MISCELLANEOUS) IMPLANT
DRSG TEGADERM 4X4.75 (GAUZE/BANDAGES/DRESSINGS) ×2 IMPLANT
DRSG TELFA 4X3 1S NADH ST (GAUZE/BANDAGES/DRESSINGS) IMPLANT
ELECT CAUTERY BLADE 6.4 (BLADE) ×2 IMPLANT
ELECT REM PT RETURN 9FT ADLT (ELECTROSURGICAL) ×2
ELECTRODE REM PT RTRN 9FT ADLT (ELECTROSURGICAL) ×1 IMPLANT
GLOVE BIOGEL PI IND STRL 7.0 (GLOVE) ×1 IMPLANT
GLOVE BIOGEL PI INDICATOR 7.0 (GLOVE) ×1
GLOVE SURG SYN 6.5 ES PF (GLOVE) ×2 IMPLANT
GOWN STRL REUS W/ TWL LRG LVL3 (GOWN DISPOSABLE) ×2 IMPLANT
GOWN STRL REUS W/TWL LRG LVL3 (GOWN DISPOSABLE) ×2
IV NS 500ML (IV SOLUTION) ×1
IV NS 500ML BAXH (IV SOLUTION) ×1 IMPLANT
KIT PORT POWER 8FR ISP CVUE (Port) ×2 IMPLANT
KIT TURNOVER KIT A (KITS) ×2 IMPLANT
LABEL OR SOLS (LABEL) ×2 IMPLANT
PACK PORT-A-CATH (MISCELLANEOUS) ×2 IMPLANT
STRIP CLOSURE SKIN 1/2X4 (GAUZE/BANDAGES/DRESSINGS) ×2 IMPLANT
SUT MNCRL AB 4-0 PS2 18 (SUTURE) ×2 IMPLANT
SUT PROLENE 2 0 SH DA (SUTURE) ×2 IMPLANT
SUT VIC AB 2-0 SH 27 (SUTURE)
SUT VIC AB 2-0 SH 27XBRD (SUTURE) IMPLANT
SUT VIC AB 3-0 SH 27 (SUTURE) ×1
SUT VIC AB 3-0 SH 27X BRD (SUTURE) ×1 IMPLANT
SYR 10ML LL (SYRINGE) ×2 IMPLANT

## 2019-08-17 NOTE — Discharge Instructions (Signed)
Port placement, Care After This sheet gives you information about how to care for yourself after your procedure. Your health care provider may also give you more specific instructions. If you have problems or questions, contact your health care provider. What can I expect after the procedure? After the procedure, it is common to have:  Soreness.  Bruising.  Itching. Follow these instructions at home: site care Follow instructions from your health care provider about how to take care of your site. Make sure you:  Wash your hands with soap and water before and after you change your bandage (dressing). If soap and water are not available, use hand sanitizer.  REMOVE OUTER DRESSING IN 24HRS.  Leave stitches (sutures), skin glue, or adhesive strips in place. These skin closures may need to stay in place for 2 weeks or longer. If adhesive strip edges start to loosen and curl up, you may trim the loose edges. Do not remove adhesive strips completely unless your health care provider tells you to do that.  If the area bleeds or bruises, apply gentle pressure for 10 minutes.  OK TO SHOWER IN 24HRS  Check your site every day for signs of infection. Check for:  Redness, swelling, or pain.  Fluid or blood.  Warmth.  Pus or a bad smell.  General instructions  OK TO USE PORT  Rest and then return to your normal activities as told by your health care provider.   tylenol needed for discomfort.     Use narcotics, if prescribed, only when tylenol and motrin is not enough to control pain.   325-650mg  every 8hrs to max of 3000mg /24hrs (including the 325mg  in every norco dose) for the tylenol.     Keep all follow-up visits as told by your health care provider. This is important. Contact a health care provider if:  You have redness, swelling, or pain around your site.  You have fluid or blood coming from your site.  Your site feels warm to the touch.  You have pus or a bad smell coming  from your site.  You have a fever.  Your sutures, skin glue, or adhesive strips loosen or come off sooner than expected. Get help right away if:  You have bleeding that does not stop with pressure or a dressing. Summary  After the procedure, it is common to have some soreness, bruising, and itching at the site.  Follow instructions from your health care provider about how to take care of your site.  Check your site every day for signs of infection.  Contact a health care provider if you have redness, swelling, or pain around your site, or your site feels warm to the touch.  Keep all follow-up visits as told by your health care provider. This is important. This information is not intended to replace advice given to you by your health care provider. Make sure you discuss any questions you have with your health care provider. Document Released: 11/15/2015 Document Revised: 04/18/2018 Document Reviewed: 04/18/2018 Elsevier Interactive Patient Education  2019 [place]   1) The drugs that you were given will stay in your system until tomorrow so for the next 24 hours you should not:  A) Drive an automobile B) Make any legal decisions C) Drink any alcoholic beverage   2) You may resume regular meals tomorrow.  Today it is better to start with liquids and gradually work up to solid foods.  You may eat anything you  prefer, but it is better to start with liquids, then soup and crackers, and gradually work up to solid foods.   3) Please notify your doctor immediately if you have any unusual bleeding, trouble breathing, redness and pain at the surgery site, drainage, fever, or pain not relieved by medication.    4) Additional Instructions:        Please contact your physician with any problems or Same Day Surgery at [phone], Monday through Friday 6 am to 4 pm, or [place] at [facility] number at [phone].

## 2019-08-17 NOTE — Anesthesia Procedure Notes (Signed)
Procedure Name: LMA Insertion Date/Time: 08/17/2019 7:35 AM Performed by: Justus Memory, CRNA Pre-anesthesia Checklist: Patient identified, Patient being monitored, Timeout performed, Emergency Drugs available and Suction available Patient Re-evaluated:Patient Re-evaluated prior to induction Oxygen Delivery Method: Circle system utilized Preoxygenation: Pre-oxygenation with 100% oxygen Induction Type: IV induction Ventilation: Mask ventilation without difficulty LMA: LMA inserted LMA Size: 4.5 Tube type: Oral Number of attempts: 1 Placement Confirmation: positive ETCO2 and breath sounds checked- equal and bilateral Tube secured with: Tape Dental Injury: Teeth and Oropharynx as per pre-operative assessment

## 2019-08-17 NOTE — Op Note (Signed)
OP NOTE  DATE OF PROCEDURE: 08/17/2019   SURGEON: Lysle Pearl  ANESTHESIA: LMA  PRE-OPERATIVE DIAGNOSIS: Right breast cancer requring port for chemotherapy   POST-OPERATIVE DIAGNOSIS: same  PROCEDURE(S):  1.) Percutaneous access of left subclavian vein  2.) Insertion of tunneled left central venous catheter with subcutaneous port  INTRAOPERATIVE FINDINGS: well-secured tunneled central venous catheter with subcutaneous port at completion of the procedure, heplocked after confirming ease of draw and push  ESTIMATED BLOOD LOSS: Minimal (<20 mL)   SPECIMENS: None   IMPLANTS: 24F tunneled Bard PowerPort central venous catheter with subcutaneous port  DRAINS: None   COMPLICATIONS: None apparent   CONDITION AT COMPLETION: Hemodynamically stable, awake   DISPOSITION: PACU   INDICATION(S) FOR PROCEDURE:  Patient is a 43 y.o. female who presented with above diagnosis.  All risks, benefits, and alternatives to above elective procedures were discussed with the patient, who elected to proceed, and informed consent was accordingly obtained at that time.  DETAILS OF PROCEDURE:  Patient was brought to the operative suite and appropriately identified.  Left shoulder neck area was prepped and draped in the usual sterile fashion, and following a timeout, percutaneous venous access was obtained with anatomical landmarks, confirming dark nonpulsatile blood draw.  Using Seldinger technique, soft guidewire was advanced with no resistance, over which access needle was withdrawn, after confirming and the guidewire was over the atriocaval junction. guidewire was secured, 2-3 cm transverse left chest incision was made starting at the needle insertion point and confirmed to accommodate the subcutaneous port.    Insertion sheath was advanced over the guidewire using fluoroscopic guidance, and then wire was withdrawn along with the insertion sheath dilator with no resistance. The catheter was introduced through the  sheath and tip left in the [place] junction under fluoro guidance and catheter cut to appropriate length.  Catheter connected to port and placed within planned fixation site.  Fluro confirmed no kink within the entire length of the catheter at this point.  Port fixed to the pocket on two side to avoid twisting. Port was confirmed to withdraw blood and flush easily with included Heuber needle, and port heplocked.  Incision re-approximated using buried interrupted 3-0 Vicryl suture, and 4-0 Monocryl suture was used to re-approximate skin at the insertion and subcutaneous port sites in running subcuticular fashion.  Incisions then dressed with steristrips, 2x2, and tegaderm. Patient was then awakened from anesthesia and transferred to PACU in stable condition.  [place] images saved in paper chart and fluoro images saved in Epic.  CXR post op confirmed proper placement of port and no evidence of pneumothorax.

## 2019-08-17 NOTE — Progress Notes (Signed)
Pt c/o nausea. Dr. Piscitello notified. Acknowledged. Orders received.  

## 2019-08-17 NOTE — Anesthesia Post-op Follow-up Note (Signed)
Anesthesia QCDR form completed.        

## 2019-08-17 NOTE — Anesthesia Preprocedure Evaluation (Addendum)
Anesthesia Evaluation  Patient identified by MRN, date of birth, ID band Patient awake    Reviewed: Allergy & Precautions, H&P , NPO status , Patient's Chart, lab work & pertinent test results  History of Anesthesia Complications Negative for: history of anesthetic complications  Airway Mallampati: III  TM Distance: <3 FB Neck ROM: full    Dental  (+) Chipped   Pulmonary neg pulmonary ROS, neg shortness of breath,           Cardiovascular Exercise Tolerance: Good (-) angina(-) Past MI and (-) DOE negative cardio ROS       Neuro/Psych negative neurological ROS  negative psych ROS   GI/Hepatic negative GI ROS, Neg liver ROS, neg GERD  ,  Endo/Other  negative endocrine ROS  Renal/GU Renal disease  negative genitourinary   Musculoskeletal  (+) Arthritis ,   Abdominal   Peds  Hematology negative hematology ROS (+)   Anesthesia Other Findings Past Medical History: No date: Cancer ([place])     Comment:  breast cancer No date: Frequency No date: Kidney stones No date: Obesity No date: Right flank pain  Past Surgical History: No date: CESAREAN SECTION     Comment:  x 2 No date: CHOLECYSTECTOMY No date: DILATION AND CURETTAGE OF UTERUS No date: TUBAL LIGATION     Reproductive/Obstetrics negative OB ROS                             Anesthesia Physical Anesthesia Plan  ASA: III  Anesthesia Plan: General and General LMA   Post-op Pain Management:    Induction: Intravenous  PONV Risk Score and Plan: Dexamethasone, Ondansetron, Midazolam and Treatment may vary due to age or medical condition  Airway Management Planned: LMA  Additional Equipment:   Intra-op Plan:   Post-operative Plan:   Informed Consent: I have reviewed the patients History and Physical, chart, labs and discussed the procedure including the risks, benefits and alternatives for the proposed anesthesia with the  patient or authorized representative who has indicated his/her understanding and acceptance.     Dental Advisory Given  Plan Discussed with: Anesthesiologist, CRNA and Surgeon  Anesthesia Plan Comments: (Patient consented for risks of anesthesia including but not limited to:  - adverse reactions to medications - risk of intubation if required - damage to teeth, lips or other oral mucosa - sore throat or hoarseness - Damage to heart, brain, lungs or loss of life  Patient voiced understanding.)       Anesthesia Quick Evaluation

## 2019-08-17 NOTE — Anesthesia Postprocedure Evaluation (Signed)
Anesthesia Post Note  Patient: Jessica [name]  Procedure(s) Performed: INSERTION PORT-A-CATH (Left Chest)  Patient location during evaluation: PACU Anesthesia Type: General Level of consciousness: awake and alert Pain management: pain level controlled Vital Signs Assessment: post-procedure vital signs reviewed and stable Respiratory status: spontaneous breathing, nonlabored ventilation, respiratory function stable and patient connected to nasal cannula oxygen Cardiovascular status: blood pressure returned to baseline and stable Postop Assessment: no apparent nausea or vomiting Anesthetic complications: no     Last Vitals:  Vitals:   08/17/19 0925 08/17/19 0935  BP: (!) 104/56 (!) 113/58  Pulse: 73 73  Resp: 14 16  Temp:  (!) 36.1 C  SpO2: 96% 97%    Last Pain:  Vitals:   08/17/19 0935  TempSrc: Temporal  PainSc: 0-No pain                 [name]

## 2019-08-17 NOTE — Transfer of Care (Signed)
Immediate Anesthesia Transfer of Care Note  Patient: Jessica [name]  Procedure(s) Performed: INSERTION PORT-A-CATH (Left Chest)  Patient Location: PACU  Anesthesia Type:General  Level of Consciousness: sedated  Airway & Oxygen Therapy: Patient Spontanous Breathing and Patient connected to face mask oxygen  Post-op Assessment: Report given to RN and Post -op Vital signs reviewed and stable  Post vital signs: Reviewed and stable  Last Vitals:  Vitals Value Taken Time  BP 123/60 08/17/19 0845  Temp 36.2 C 08/17/19 0845  Pulse 102 08/17/19 0847  Resp 15 08/17/19 0847  SpO2 95 % 08/17/19 0847  Vitals shown include unvalidated device data.  Last Pain:  Vitals:   08/17/19 0845  TempSrc:   PainSc: 5          Complications: No apparent anesthesia complications

## 2019-08-17 NOTE — Interval H&P Note (Signed)
History and Physical Interval Note:  08/17/2019 7:19 AM  Jessica [name]  has presented today for surgery, with the diagnosis of C50.911  Breast cancer metastasized to axillary lymph node, right breast.  The various methods of treatment have been discussed with the patient and family. After consideration of risks, benefits and other options for treatment, the patient has consented to  Procedure(s): INSERTION PORT-A-CATH (N/A) as a surgical intervention.  The patient's history has been reviewed, patient examined, no change in status, stable for surgery.  I have reviewed the patient's chart and labs.  Questions were answered to the patient's satisfaction.     Marrion Finan Lysle Pearl

## 2019-08-18 ENCOUNTER — Encounter: Payer: Self-pay | Admitting: Hematology

## 2019-08-18 ENCOUNTER — Inpatient Hospital Stay (HOSPITAL_BASED_OUTPATIENT_CLINIC_OR_DEPARTMENT_OTHER): Payer: BC Managed Care – PPO | Admitting: Hematology

## 2019-08-18 ENCOUNTER — Inpatient Hospital Stay: Payer: BC Managed Care – PPO

## 2019-08-18 ENCOUNTER — Inpatient Hospital Stay: Payer: BC Managed Care – PPO | Attending: Hematology

## 2019-08-18 ENCOUNTER — Other Ambulatory Visit: Payer: Self-pay

## 2019-08-18 ENCOUNTER — Telehealth: Payer: Self-pay | Admitting: *Deleted

## 2019-08-18 VITALS — BP 128/60 | HR 87 | Temp 98.5°F | Resp 18 | Wt 220.5 lb

## 2019-08-18 DIAGNOSIS — C50919 Malignant neoplasm of unspecified site of unspecified female breast: Secondary | ICD-10-CM

## 2019-08-18 DIAGNOSIS — Z79899 Other long term (current) drug therapy: Secondary | ICD-10-CM | POA: Diagnosis not present

## 2019-08-18 DIAGNOSIS — Z791 Long term (current) use of non-steroidal anti-inflammatories (NSAID): Secondary | ICD-10-CM | POA: Insufficient documentation

## 2019-08-18 DIAGNOSIS — M719 Bursopathy, unspecified: Secondary | ICD-10-CM | POA: Diagnosis not present

## 2019-08-18 DIAGNOSIS — M5430 Sciatica, unspecified side: Secondary | ICD-10-CM | POA: Insufficient documentation

## 2019-08-18 DIAGNOSIS — Z170 Estrogen receptor positive status [ER+]: Secondary | ICD-10-CM

## 2019-08-18 DIAGNOSIS — Z5111 Encounter for antineoplastic chemotherapy: Secondary | ICD-10-CM | POA: Diagnosis not present

## 2019-08-18 DIAGNOSIS — C50311 Malignant neoplasm of lower-inner quadrant of right female breast: Secondary | ICD-10-CM | POA: Diagnosis not present

## 2019-08-18 DIAGNOSIS — D509 Iron deficiency anemia, unspecified: Secondary | ICD-10-CM | POA: Diagnosis not present

## 2019-08-18 LAB — CBC WITH DIFFERENTIAL (CANCER CENTER ONLY)
Abs Immature Granulocytes: 0.09 10*3/uL — ABNORMAL HIGH (ref 0.00–0.07)
Basophils Absolute: 0 10*3/uL (ref 0.0–0.1)
Basophils Relative: 0 %
Eosinophils Absolute: 0 10*3/uL (ref 0.0–0.5)
Eosinophils Relative: 0 %
HCT: 35.5 % — ABNORMAL LOW (ref 36.0–46.0)
Hemoglobin: 11.3 g/dL — ABNORMAL LOW (ref 12.0–15.0)
Immature Granulocytes: 0 %
Lymphocytes Relative: 19 %
Lymphs Abs: 3.8 10*3/uL (ref 0.7–4.0)
MCH: 27.6 pg (ref 26.0–34.0)
MCHC: 31.8 g/dL (ref 30.0–36.0)
MCV: 86.6 fL (ref 80.0–100.0)
Monocytes Absolute: 1.3 10*3/uL — ABNORMAL HIGH (ref 0.1–1.0)
Monocytes Relative: 7 %
Neutro Abs: 14.9 10*3/uL — ABNORMAL HIGH (ref 1.7–7.7)
Neutrophils Relative %: 74 %
Platelet Count: 452 10*3/uL — ABNORMAL HIGH (ref 150–400)
RBC: 4.1 MIL/uL (ref 3.87–5.11)
RDW: 13.2 % (ref 11.5–15.5)
WBC Count: 20.1 10*3/uL — ABNORMAL HIGH (ref 4.0–10.5)
nRBC: 0 % (ref 0.0–0.2)

## 2019-08-18 LAB — CMP (CANCER CENTER ONLY)
ALT: 18 U/L (ref 0–44)
AST: 14 U/L — ABNORMAL LOW (ref 15–41)
Albumin: 3.5 g/dL (ref 3.5–5.0)
Alkaline Phosphatase: 77 U/L (ref 38–126)
Anion gap: 11 (ref 5–15)
BUN: 13 mg/dL (ref 6–20)
CO2: 24 mmol/L (ref 22–32)
Calcium: 9.1 mg/dL (ref 8.9–10.3)
Chloride: 106 mmol/L (ref 98–111)
Creatinine: 0.75 mg/dL (ref 0.44–1.00)
GFR, Est AFR Am: >60 mL/min (ref >60)
GFR, Estimated: >60 mL/min (ref >60)
Glucose, Bld: 110 mg/dL — ABNORMAL HIGH (ref 70–99)
Potassium: 3.7 mmol/L (ref 3.5–5.1)
Sodium: 141 mmol/L (ref 135–145)
Total Bilirubin: 0.3 mg/dL (ref 0.3–1.2)
Total Protein: 7.3 g/dL (ref 6.5–8.1)

## 2019-08-18 MED ORDER — PALONOSETRON HCL INJECTION 0.25 MG/5ML
INTRAVENOUS | Status: AC
  Filled 2019-08-18: qty 5

## 2019-08-18 MED ORDER — SODIUM CHLORIDE 0.9% FLUSH
10.0000 mL | INTRAVENOUS | Status: DC | PRN
  Administered 2019-08-18: 10 mL
  Filled 2019-08-18: qty 10

## 2019-08-18 MED ORDER — SODIUM CHLORIDE 0.9 % IV SOLN
Freq: Once | INTRAVENOUS | Status: AC
  Administered 2019-08-18: 09:00:00 via INTRAVENOUS
  Filled 2019-08-18: qty 250

## 2019-08-18 MED ORDER — PALONOSETRON HCL INJECTION 0.25 MG/5ML
0.2500 mg | Freq: Once | INTRAVENOUS | Status: AC
  Administered 2019-08-18: 0.25 mg via INTRAVENOUS

## 2019-08-18 MED ORDER — SODIUM CHLORIDE 0.9 % IV SOLN
600.0000 mg/m2 | Freq: Once | INTRAVENOUS | Status: AC
  Administered 2019-08-18: 1260 mg via INTRAVENOUS
  Filled 2019-08-18: qty 63

## 2019-08-18 MED ORDER — DOXORUBICIN HCL CHEMO IV INJECTION 2 MG/ML
60.0000 mg/m2 | Freq: Once | INTRAVENOUS | Status: AC
  Administered 2019-08-18: 126 mg via INTRAVENOUS
  Filled 2019-08-18: qty 63

## 2019-08-18 MED ORDER — SODIUM CHLORIDE 0.9 % IV SOLN
Freq: Once | INTRAVENOUS | Status: AC
  Administered 2019-08-18: 10:00:00 via INTRAVENOUS
  Filled 2019-08-18: qty 5

## 2019-08-18 MED ORDER — HEPARIN SOD (PORK) LOCK FLUSH 100 UNIT/ML IV SOLN
500.0000 [IU] | Freq: Once | INTRAVENOUS | Status: AC | PRN
  Administered 2019-08-18: 500 [IU]
  Filled 2019-08-18: qty 5

## 2019-08-18 NOTE — Telephone Encounter (Signed)
Spoke to pt during 1st chemo. Relate doing well. Denies questions or needs at this time. Encourage pt to call with concerns. Received verbal understanding. Contact information provided.

## 2019-08-18 NOTE — Progress Notes (Signed)
Per Dr. Burr Medico, ok to treat with labs today.   Per Dr. Burr Medico, ok for patient to receive Udenyca injection on 08/19/19 with current ANC count.   Dignicap post cooling time complete at 1450. Patient had no issues using dignicap, tolerated therapy well.

## 2019-08-18 NOTE — Patient Instructions (Addendum)
[facility] Discharge Instructions for Patients Receiving Chemotherapy  **You received Aloxi as a premedication for your treatment today.  Do not take ondansetron (Zofran) for 3 days. The next time you can take zofran will be Monday afternoon.   Today you received the following chemotherapy agents: doxorubicin and cyclophosphamide.  To help prevent nausea and vomiting after your treatment, we encourage you to take your nausea medication as prescribed by your physician.    If you develop nausea and vomiting that is not controlled by your nausea medication, call the clinic.   BELOW ARE SYMPTOMS THAT SHOULD BE REPORTED IMMEDIATELY:  *FEVER GREATER THAN 100.5 F  *CHILLS WITH OR WITHOUT FEVER  NAUSEA AND VOMITING THAT IS NOT CONTROLLED WITH YOUR NAUSEA MEDICATION  *UNUSUAL SHORTNESS OF BREATH  *UNUSUAL BRUISING OR BLEEDING  TENDERNESS IN MOUTH AND THROAT WITH OR WITHOUT PRESENCE OF ULCERS  *URINARY PROBLEMS  *BOWEL PROBLEMS  UNUSUAL RASH Items with * indicate a potential emergency and should be followed up as soon as possible.  Feel free to call the clinic should you have any questions or concerns. The clinic phone number is [phone].  Please show the Branch at check-in to the Emergency Department and triage nurse.  Doxorubicin injection What is this medicine? DOXORUBICIN (dox oh ROO bi sin) is a chemotherapy drug. It is used to treat many kinds of cancer like leukemia, lymphoma, neuroblastoma, sarcoma, and Wilms' tumor. It is also used to treat bladder cancer, breast cancer, lung cancer, ovarian cancer, stomach cancer, and thyroid cancer. This medicine may be used for other purposes; ask your health care provider or pharmacist if you have questions. COMMON BRAND NAME(S): Adriamycin, Adriamycin PFS, Adriamycin RDF, Rubex What should I tell my health care provider before I take this medicine? They need to know if you have any of these  conditions:  heart disease  history of low blood counts caused by a medicine  liver disease  recent or ongoing radiation therapy  an unusual or allergic reaction to doxorubicin, other chemotherapy agents, other medicines, foods, dyes, or preservatives  pregnant or trying to get pregnant  breast-feeding How should I use this medicine? This drug is given as an infusion into a vein. It is administered in a hospital or clinic by a specially trained health care professional. If you have pain, swelling, burning or any unusual feeling around the site of your injection, tell your health care professional right away. Talk to your pediatrician regarding the use of this medicine in children. Special care may be needed. Overdosage: If you think you have taken too much of this medicine contact a poison control center or emergency room at once. NOTE: This medicine is only for you. Do not share this medicine with others. What if I miss a dose? It is important not to miss your dose. Call your doctor or health care professional if you are unable to keep an appointment. What may interact with this medicine? This medicine may interact with the following medications:  6-mercaptopurine  paclitaxel  phenytoin  St. John's Wort  trastuzumab  verapamil This list may not describe all possible interactions. Give your health care provider a list of all the medicines, herbs, non-prescription drugs, or dietary supplements you use. Also tell them if you smoke, drink alcohol, or use illegal drugs. Some items may interact with your medicine. What should I watch for while using this medicine? This drug may make you feel generally unwell. This is not uncommon, as  chemotherapy can affect healthy cells as well as cancer cells. Report any side effects. Continue your course of treatment even though you feel ill unless your doctor tells you to stop. There is a maximum amount of this medicine you should receive  throughout your life. The amount depends on the medical condition being treated and your overall health. Your doctor will watch how much of this medicine you receive in your lifetime. Tell your doctor if you have taken this medicine before. You may need blood work done while you are taking this medicine. Your urine may turn red for a few days after your dose. This is not blood. If your urine is dark or brown, call your doctor. In some cases, you may be given additional medicines to help with side effects. Follow all directions for their use. Call your doctor or health care professional for advice if you get a fever, chills or sore throat, or other symptoms of a cold or flu. Do not treat yourself. This drug decreases your body's ability to fight infections. Try to avoid being around people who are sick. This medicine may increase your risk to bruise or bleed. Call your doctor or health care professional if you notice any unusual bleeding. Talk to your doctor about your risk of cancer. You may be more at risk for certain types of cancers if you take this medicine. Do not become pregnant while taking this medicine or for 6 months after stopping it. Women should inform their doctor if they wish to become pregnant or think they might be pregnant. Men should not father a child while taking this medicine and for 6 months after stopping it. There is a potential for serious side effects to an unborn child. Talk to your health care professional or pharmacist for more information. Do not breast-feed an infant while taking this medicine. This medicine has caused ovarian failure in some women and reduced sperm counts in some men This medicine may interfere with the ability to have a child. Talk with your doctor or health care professional if you are concerned about your fertility. This medicine may cause a decrease in Co-Enzyme Q-10. You should make sure that you get enough Co-Enzyme Q-10 while you are taking this  medicine. Discuss the foods you eat and the vitamins you take with your health care professional. What side effects may I notice from receiving this medicine? Side effects that you should report to your doctor or health care professional as soon as possible:  allergic reactions like skin rash, itching or hives, swelling of the face, lips, or tongue  breathing problems  chest pain  fast or irregular heartbeat  low blood counts - this medicine may decrease the number of white blood cells, red blood cells and platelets. You may be at increased risk for infections and bleeding.  pain, redness, or irritation at site where injected  signs of infection - fever or chills, cough, sore throat, pain or difficulty passing urine  signs of decreased platelets or bleeding - bruising, pinpoint red spots on the skin, black, tarry stools, blood in the urine  swelling of the ankles, feet, hands  tiredness  weakness Side effects that usually do not require medical attention (report to your doctor or health care professional if they continue or are bothersome):  diarrhea  hair loss  mouth sores  nail discoloration or damage  nausea  red colored urine  vomiting This list may not describe all possible side effects. Call your doctor for  medical advice about side effects. You may report side effects to FDA at 1-800-FDA-1088. Where should I keep my medicine? This drug is given in a hospital or clinic and will not be stored at home. NOTE: This sheet is a summary. It may not cover all possible information. If you have questions about this medicine, talk to your doctor, pharmacist, or health care provider.  2020 Elsevier/Gold Standard (2017-06-02 11:01:26)  Cyclophosphamide injection What is this medicine? CYCLOPHOSPHAMIDE (sye kloe FOSS fa mide) is a chemotherapy drug. It slows the growth of cancer cells. This medicine is used to treat many types of cancer like lymphoma, myeloma, leukemia,  breast cancer, and ovarian cancer, to name a few. This medicine may be used for other purposes; ask your health care provider or pharmacist if you have questions. COMMON BRAND NAME(S): Cytoxan, Neosar What should I tell my health care provider before I take this medicine? They need to know if you have any of these conditions:  blood disorders  history of other chemotherapy  infection  kidney disease  liver disease  recent or ongoing radiation therapy  tumors in the bone marrow  an unusual or allergic reaction to cyclophosphamide, other chemotherapy, other medicines, foods, dyes, or preservatives  pregnant or trying to get pregnant  breast-feeding How should I use this medicine? This drug is usually given as an injection into a vein or muscle or by infusion into a vein. It is administered in a hospital or clinic by a specially trained health care professional. Talk to your pediatrician regarding the use of this medicine in children. Special care may be needed. Overdosage: If you think you have taken too much of this medicine contact a poison control center or emergency room at once. NOTE: This medicine is only for you. Do not share this medicine with others. What if I miss a dose? It is important not to miss your dose. Call your doctor or health care professional if you are unable to keep an appointment. What may interact with this medicine? This medicine may interact with the following medications:  amiodarone  amphotericin B  azathioprine  certain antiviral medicines for HIV or AIDS such as protease inhibitors (e.g., indinavir, ritonavir) and zidovudine  certain blood pressure medications such as benazepril, captopril, enalapril, fosinopril, lisinopril, moexipril, monopril, perindopril, quinapril, ramipril, trandolapril  certain cancer medications such as anthracyclines (e.g., daunorubicin, doxorubicin), busulfan, cytarabine, paclitaxel, pentostatin, tamoxifen,  trastuzumab  certain diuretics such as chlorothiazide, chlorthalidone, hydrochlorothiazide, indapamide, metolazone  certain medicines that treat or prevent blood clots like warfarin  certain muscle relaxants such as succinylcholine  cyclosporine  etanercept  indomethacin  medicines to increase blood counts like filgrastim, pegfilgrastim, sargramostim  medicines used as general anesthesia  metronidazole  natalizumab This list may not describe all possible interactions. Give your health care provider a list of all the medicines, herbs, non-prescription drugs, or dietary supplements you use. Also tell them if you smoke, drink alcohol, or use illegal drugs. Some items may interact with your medicine. What should I watch for while using this medicine? Visit your doctor for checks on your progress. This drug may make you feel generally unwell. This is not uncommon, as chemotherapy can affect healthy cells as well as cancer cells. Report any side effects. Continue your course of treatment even though you feel ill unless your doctor tells you to stop. Drink water or other fluids as directed. Urinate often, even at night. In some cases, you may be given additional medicines to help with  side effects. Follow all directions for their use. Call your doctor or health care professional for advice if you get a fever, chills or sore throat, or other symptoms of a cold or flu. Do not treat yourself. This drug decreases your body's ability to fight infections. Try to avoid being around people who are sick. This medicine may increase your risk to bruise or bleed. Call your doctor or health care professional if you notice any unusual bleeding. Be careful brushing and flossing your teeth or using a toothpick because you may get an infection or bleed more easily. If you have any dental work done, tell your dentist you are receiving this medicine. You may get drowsy or dizzy. Do not drive, use machinery, or do  anything that needs mental alertness until you know how this medicine affects you. Do not become pregnant while taking this medicine or for 1 year after stopping it. Women should inform their doctor if they wish to become pregnant or think they might be pregnant. Men should not father a child while taking this medicine and for 4 months after stopping it. There is a potential for serious side effects to an unborn child. Talk to your health care professional or pharmacist for more information. Do not breast-feed an infant while taking this medicine. This medicine may interfere with the ability to have a child. This medicine has caused ovarian failure in some women. This medicine has caused reduced sperm counts in some men. You should talk with your doctor or health care professional if you are concerned about your fertility. If you are going to have surgery, tell your doctor or health care professional that you have taken this medicine. What side effects may I notice from receiving this medicine? Side effects that you should report to your doctor or health care professional as soon as possible:  allergic reactions like skin rash, itching or hives, swelling of the face, lips, or tongue  low blood counts - this medicine may decrease the number of white blood cells, red blood cells and platelets. You may be at increased risk for infections and bleeding.  signs of infection - fever or chills, cough, sore throat, pain or difficulty passing urine  signs of decreased platelets or bleeding - bruising, pinpoint red spots on the skin, black, tarry stools, blood in the urine  signs of decreased red blood cells - unusually weak or tired, fainting spells, lightheadedness  breathing problems  dark urine  dizziness  palpitations  swelling of the ankles, feet, hands  trouble passing urine or change in the amount of urine  weight gain  yellowing of the eyes or skin Side effects that usually do not  require medical attention (report to your doctor or health care professional if they continue or are bothersome):  changes in nail or skin color  hair loss  missed menstrual periods  mouth sores  nausea, vomiting This list may not describe all possible side effects. Call your doctor for medical advice about side effects. You may report side effects to FDA at 1-800-FDA-1088. Where should I keep my medicine? This drug is given in a hospital or clinic and will not be stored at home. NOTE: This sheet is a summary. It may not cover all possible information. If you have questions about this medicine, talk to your doctor, pharmacist, or health care provider.  2020 Elsevier/Gold Standard (2012-09-02 16:22:58)

## 2019-08-19 ENCOUNTER — Inpatient Hospital Stay: Payer: BC Managed Care – PPO

## 2019-08-19 ENCOUNTER — Other Ambulatory Visit: Payer: Self-pay

## 2019-08-19 VITALS — BP 147/80 | HR 78 | Temp 98.5°F | Resp 18

## 2019-08-19 DIAGNOSIS — C50311 Malignant neoplasm of lower-inner quadrant of right female breast: Secondary | ICD-10-CM

## 2019-08-19 DIAGNOSIS — Z170 Estrogen receptor positive status [ER+]: Secondary | ICD-10-CM

## 2019-08-19 LAB — CANCER ANTIGEN 27.29: CA 27.29: 52.1 U/mL — ABNORMAL HIGH (ref 0.0–38.6)

## 2019-08-19 MED ORDER — PEGFILGRASTIM-CBQV 6 MG/0.6ML ~~LOC~~ SOSY
6.0000 mg | PREFILLED_SYRINGE | Freq: Once | SUBCUTANEOUS | Status: AC
  Administered 2019-08-19: 6 mg via SUBCUTANEOUS

## 2019-08-19 MED ORDER — PEGFILGRASTIM-CBQV 6 MG/0.6ML ~~LOC~~ SOSY
PREFILLED_SYRINGE | SUBCUTANEOUS | Status: AC
  Filled 2019-08-19: qty 0.6

## 2019-08-19 NOTE — Patient Instructions (Signed)

## 2019-08-21 ENCOUNTER — Telehealth: Payer: Self-pay | Admitting: Hematology

## 2019-08-21 ENCOUNTER — Encounter: Payer: Self-pay | Admitting: *Deleted

## 2019-08-21 ENCOUNTER — Telehealth: Payer: Self-pay | Admitting: *Deleted

## 2019-08-21 NOTE — Progress Notes (Signed)
Follow up call post first chemo.  Patient states she is nauseated and very fatigued.  She using her antiemetics.  States she is getting fluids in.  She is to call with any further needs.  Informed her we had ordered the [place].  Will let her know when it's available.

## 2019-08-21 NOTE — Telephone Encounter (Signed)
No los per 10/16.

## 2019-08-21 NOTE — Progress Notes (Signed)
[place] [place]   Telephone:(336) [phone] Fax:([phone]) [phone]   Clinic Follow up Note   Patient Care Team: Ricardo Jericho, NP as PCP - General (Family Medicine) Rico Junker, RN as Registered Nurse  Date of Service:  08/31/2019   CHIEF COMPLAINT: F/u of right breast cancer   SUMMARY OF ONCOLOGIC HISTORY: Oncology History Overview Note  Cancer Staging Malignant neoplasm of lower-inner quadrant of right breast of female, estrogen receptor positive ([place]) Staging form: Breast, AJCC 8th Edition - Clinical stage from 07/26/2019: Stage IIB (cT2, cN1, cM0, G3, ER+, PR+, HER2-) - Unsigned    Malignant neoplasm of lower-inner quadrant of right breast of female, estrogen receptor positive ([place])  07/20/2019 Mammogram   Mammogram 07/20/19  IMPRESSION: Suspicious palpable right breast mass 3.4 x 2.5 x 3.4 cm 5 o'clock position 3 cm from nipple.   Suspicious 1.6cm cortically thickened right axillary lymph node.   07/26/2019 Initial Diagnosis   DIAGNOSIS: 07/26/19 A. RIGHT BREAST, 5:00, 3CMFN; ULTRASOUND-GUIDED NEEDLE CORE BIOPSY:  - INVASIVE MAMMARY CARCINOMA, NO SPECIAL TYPE.    07/26/2019 Receptors her2   BREAST BIOMARKER TESTS  Estrogen Receptor (ER) Status: POSITIVE                       Percentage of cells with nuclear positivity: 51-90%                       Average intensity of staining: Strong   Progesterone Receptor (PgR) Status: POSITIVE                       Percentage of cells with nuclear positivity:  Greater than 90%                       Average intensity of staining: Strong   HER2 (by immunohistochemistry): NEGATIVE (Score 1+)    08/05/2019 Initial Diagnosis   Invasive carcinoma of breast ([place])   08/17/2019 Surgery   INSERTION PORT-A-CATH by Dr. Lysle Pearl 08/17/19    08/18/2019 -  Chemotherapy   AC q2weeks for 4 cycles starting 08/18/19 followed by weekly Taxol for 12 weeks       CURRENT THERAPY:  Neoadjuvant chemo ddAC followed by weekly  Taxol   INTERVAL HISTORY:  Jessica [name] is here for a follow up and second cycle chemo. She tolerated the first cycle chemo well overall.  She experienced mild fatigue, nausea, and low appetite for 3 to 4 days after chemo, and has recovered well.  She denies any fever, chills, cough, abdominal discomfort, or other new complaints.  The review of system otherwise negative.  MEDICAL HISTORY:  Past Medical History:  Diagnosis Date   Cancer [facility])    breast cancer   Frequency    Kidney stones    Obesity    Right flank pain     SURGICAL HISTORY: Past Surgical History:  Procedure Laterality Date   CESAREAN SECTION     x 2   CHOLECYSTECTOMY     DILATION AND CURETTAGE OF UTERUS     PORTACATH PLACEMENT Left 08/17/2019   Procedure: INSERTION PORT-A-CATH;  Surgeon: Benjamine Sprague, DO;  Location: [facility] ORS;  Service: General;  Laterality: Left;   TUBAL LIGATION      I have reviewed the social history and family history with the patient and they are unchanged from previous note.  ALLERGIES:  has No Known Allergies.  MEDICATIONS:  Current  Outpatient Medications  Medication Sig Dispense Refill   acetaminophen (TYLENOL) 325 MG tablet Take 2 tablets (650 mg total) by mouth every 8 (eight) hours as needed for mild pain. 40 tablet 0   dexamethasone (DECADRON) 4 MG tablet Take 2 tablets by mouth once a day on the day after chemotherapy and then take 2 tablets two times a day for 2 days. Take with food. 30 tablet 1   ferrous sulfate 325 (65 FE) MG EC tablet TAKE 1 TABLET (325 MG TOTAL) BY MOUTH 2 (TWO) TIMES DAILY WITH A MEAL. 60 tablet 1   gabapentin (NEURONTIN) 600 MG tablet Take 600 mg by mouth 3 (three) times daily.     lidocaine-prilocaine (EMLA) cream Apply to affected area once 30 g 3   meloxicam (MOBIC) 15 MG tablet Take 15 mg by mouth daily.     nystatin (MYCOSTATIN) 100000 UNIT/ML suspension Take 5 mLs (500,000 Units total) by mouth 3 (three) times daily. Swish and  spit. 60 mL 0   omeprazole (PRILOSEC) 20 MG capsule Take 1 capsule (20 mg total) by mouth daily. 30 capsule 0   ondansetron (ZOFRAN) 8 MG tablet Take 1 tablet (8 mg total) by mouth 2 (two) times daily as needed. Start on the third day after chemotherapy. (Patient not taking: Reported on 08/24/2019) 30 tablet 1   prochlorperazine (COMPAZINE) 10 MG tablet Take 1 tablet (10 mg total) by mouth every 6 (six) hours as needed (Nausea or vomiting). (Patient not taking: Reported on 08/24/2019) 30 tablet 1   No current facility-administered medications for this visit.     PHYSICAL EXAMINATION: ECOG PERFORMANCE STATUS: 0 - Asymptomatic Weight 215 pounds, temperature 116/76, heart rate 101, respirate 18, pulse ox 98% on room air.   GENERAL:alert, no distress and comfortable SKIN: skin color, texture, turgor are normal, no rashes or significant lesions EYES: normal, Conjunctiva are pink and non-injected, sclera clear NEURO: alert & oriented x 3 with fluent speech, no focal motor/sensory deficits No LE edema Breast exam deferred today   LABORATORY DATA:  I have reviewed the data as listed CBC Latest Ref Rng & Units 08/31/2019 08/25/2019 08/18/2019  WBC 4.0 - 10.5 K/uL 9.7 4.4 20.1(H)  Hemoglobin 12.0 - 15.0 g/dL 11.2(L) 10.9(L) 11.3(L)  Hematocrit 36.0 - 46.0 % 35.1(L) 34.0(L) 35.5(L)  Platelets 150 - 400 K/uL 215 270 452(H)     CMP Latest Ref Rng & Units 08/31/2019 08/25/2019 08/18/2019  Glucose 70 - 99 mg/dL 135(H) 129(H) 110(H)  BUN 6 - 20 mg/dL _0 Creatinine 0.44 - 1.00 mg/dL 0.75 0.48 0.75  Sodium 135 - 145 mmol/L 138 135 141  Potassium 3.5 - 5.1 mmol/L 3.8 4.0 3.7  Chloride 98 - 111 mmol/L 105 103 106  CO2 22 - 32 mmol/L _1 Calcium 8.9 - 10.3 mg/dL 9.0 9.2 9.1  Total Protein 6.5 - 8.1 g/dL 7.0 6.8 7.3  Total Bilirubin 0.3 - 1.2 mg/dL <0.2(L) 0.5 0.3  Alkaline Phos 38 - 126 U/L 97 87 77  AST 15 - 41 U/L 8(L) 13(L) 14(L)  ALT 0 - 44 U/L _2 RADIOGRAPHIC  STUDIES: I have personally reviewed the radiological images as listed and agreed with the findings in the report. No results found.   ASSESSMENT & PLAN:  Jessica [name] is a 43 y.o. female with   1. Invasive right breast cancer, Stage IIb, c(T2N1M0), ER/PR+, HER2-, Grade III -She was recently diagnosed in 07/2019 with  grade III invasive mammary carcinoma, node positive. With this tumor size and locally advanced high grade disease she has a higher risk of cancer recurrence after curative surgery.  °-Neoadjuvant chemo with dose dense Adriamycin and Cytoxan q2weeks for 4 cycles followed by weekly Taxol for 12 weeks was recommended by Dr. Yu before proceeding with surgery. °-She tolerated first cycle AC well, with mild side effects, has recovered well. °-Lab reviewed, adequate for treatment, will proceed to second cycle chemo today °-return in 2 weeks before cycle 3. °  °  °2. Iron deficient anemia  °-She will restart oral iron. I encouraged her to increase to 2 times a day.  °-Hg at 11.3 today (08/18/19). Will monitor on chemo.  °-If oral iron not sufficient, will consider IV iron. °-anemia improved.   °  °PLAN:  °-Labs reviewed and adequate to proceed with C2 AC today with Udenyca tomorrow  °-Lab, flush, f/u and AC in 2 weeks before cycle 3 ° °No problem-specific Assessment & Plan notes found for this encounter. ° ° °No orders of the defined types were placed in this encounter. ° °All questions were answered. The patient knows to call the clinic with any problems, questions or concerns. No barriers to learning was detected. °I spent 15 minutes counseling the patient face to face. The total time spent in the appointment was 20 minutes and more than 50% was on counseling and review of test results ° °  ° Yan Feng, MD °09/02/2019  ° °I, Amoya Bennett, am acting as scribe for Yan Feng, MD.  ° °I have reviewed the above documentation for accuracy and completeness, and I agree with the above. °  ° ° ° ° °

## 2019-08-22 ENCOUNTER — Encounter: Payer: Self-pay | Admitting: Oncology

## 2019-08-24 NOTE — Progress Notes (Signed)
Patient is coming in for follow up, she is doing well she did mention that she had constipation but that has resolved, she is now having looser stools. Appetite is ok she gets full easy and has no taste

## 2019-08-25 ENCOUNTER — Other Ambulatory Visit: Payer: Self-pay

## 2019-08-25 ENCOUNTER — Encounter: Payer: Self-pay | Admitting: Oncology

## 2019-08-25 ENCOUNTER — Inpatient Hospital Stay (HOSPITAL_BASED_OUTPATIENT_CLINIC_OR_DEPARTMENT_OTHER): Payer: BC Managed Care – PPO | Admitting: Oncology

## 2019-08-25 ENCOUNTER — Inpatient Hospital Stay: Payer: BC Managed Care – PPO

## 2019-08-25 VITALS — BP 112/68 | HR 80 | Temp 97.0°F | Resp 18 | Wt 213.7 lb

## 2019-08-25 DIAGNOSIS — E611 Iron deficiency: Secondary | ICD-10-CM | POA: Diagnosis not present

## 2019-08-25 DIAGNOSIS — C50311 Malignant neoplasm of lower-inner quadrant of right female breast: Secondary | ICD-10-CM

## 2019-08-25 DIAGNOSIS — K219 Gastro-esophageal reflux disease without esophagitis: Secondary | ICD-10-CM

## 2019-08-25 DIAGNOSIS — R5383 Other fatigue: Secondary | ICD-10-CM

## 2019-08-25 DIAGNOSIS — R110 Nausea: Secondary | ICD-10-CM | POA: Diagnosis not present

## 2019-08-25 DIAGNOSIS — C50919 Malignant neoplasm of unspecified site of unspecified female breast: Secondary | ICD-10-CM | POA: Diagnosis not present

## 2019-08-25 LAB — COMPREHENSIVE METABOLIC PANEL
ALT: 12 U/L (ref 0–44)
AST: 13 U/L — ABNORMAL LOW (ref 15–41)
Albumin: 3.7 g/dL (ref 3.5–5.0)
Alkaline Phosphatase: 87 U/L (ref 38–126)
Anion gap: 8 (ref 5–15)
BUN: 12 mg/dL (ref 6–20)
CO2: 24 mmol/L (ref 22–32)
Calcium: 9.2 mg/dL (ref 8.9–10.3)
Chloride: 103 mmol/L (ref 98–111)
Creatinine, Ser: 0.48 mg/dL (ref 0.44–1.00)
GFR calc Af Amer: >60 mL/min (ref >60)
GFR calc non Af Amer: >60 mL/min (ref >60)
Glucose, Bld: 129 mg/dL — ABNORMAL HIGH (ref 70–99)
Potassium: 4 mmol/L (ref 3.5–5.1)
Sodium: 135 mmol/L (ref 135–145)
Total Bilirubin: 0.5 mg/dL (ref 0.3–1.2)
Total Protein: 6.8 g/dL (ref 6.5–8.1)

## 2019-08-25 LAB — CBC WITH DIFFERENTIAL/PLATELET
Abs Immature Granulocytes: 0.1 10*3/uL — ABNORMAL HIGH (ref 0.00–0.07)
Band Neutrophils: 8 %
Basophils Absolute: 0 10*3/uL (ref 0.0–0.1)
Basophils Relative: 1 %
Eosinophils Absolute: 0.4 10*3/uL (ref 0.0–0.5)
Eosinophils Relative: 8 %
HCT: 34 % — ABNORMAL LOW (ref 36.0–46.0)
Hemoglobin: 10.9 g/dL — ABNORMAL LOW (ref 12.0–15.0)
Lymphocytes Relative: 47 %
Lymphs Abs: 2.1 10*3/uL (ref 0.7–4.0)
MCH: 27.5 pg (ref 26.0–34.0)
MCHC: 32.1 g/dL (ref 30.0–36.0)
MCV: 85.6 fL (ref 80.0–100.0)
Metamyelocytes Relative: 2 %
Monocytes Absolute: 0.3 10*3/uL (ref 0.1–1.0)
Monocytes Relative: 7 %
Myelocytes: 1 %
Neutro Abs: 1.5 10*3/uL — ABNORMAL LOW (ref 1.7–7.7)
Neutrophils Relative %: 26 %
Platelets: 270 10*3/uL (ref 150–400)
RBC: 3.97 MIL/uL (ref 3.87–5.11)
RDW: 12.9 % (ref 11.5–15.5)
Smear Review: NORMAL
WBC: 4.4 10*3/uL (ref 4.0–10.5)
nRBC: 0 % (ref 0.0–0.2)

## 2019-08-25 LAB — IRON AND TIBC
Iron: 63 ug/dL (ref 28–170)
Saturation Ratios: 19 % (ref 10.4–31.8)
TIBC: 341 ug/dL (ref 250–450)
UIBC: 278 ug/dL

## 2019-08-25 LAB — FERRITIN: Ferritin: 156 ng/mL (ref 11–307)

## 2019-08-25 MED ORDER — NYSTATIN 100000 UNIT/ML MT SUSP: 5.0000 mL | Freq: Three times a day (TID) | OROMUCOSAL | 0 refills | Status: DC

## 2019-08-25 MED ORDER — OMEPRAZOLE 20 MG PO CPDR: 20.0000 mg | DELAYED_RELEASE_CAPSULE | Freq: Every day | ORAL | 0 refills | Status: DC

## 2019-08-25 NOTE — Progress Notes (Signed)
Patient here for follow up. States she did not feel good the first 3 days after chemo. Appetite decreased. She is also having some bone pain.

## 2019-08-26 DIAGNOSIS — Z7189 Other specified counseling: Secondary | ICD-10-CM | POA: Insufficient documentation

## 2019-08-26 DIAGNOSIS — C50919 Malignant neoplasm of unspecified site of unspecified female breast: Secondary | ICD-10-CM | POA: Insufficient documentation

## 2019-08-26 DIAGNOSIS — E611 Iron deficiency: Secondary | ICD-10-CM | POA: Insufficient documentation

## 2019-08-26 NOTE — Progress Notes (Signed)
Hematology/Oncology follow up note [facility] [phone] [phone]   Patient Care Team: Ricardo Jericho, NP as PCP - General (Family Medicine) Rico Junker, RN as Registered Nurse  CHIEF COMPLAINTS/REASON FOR VISIT:  Follow up for breast cancer  HISTORY OF PRESENTING ILLNESS:  Jessica [name] is a  43 y.o.  female with PMH listed below who was referred to me for evaluation of breast cancer Patient felt right breast mass. Patient had mammogram and ultrasound on 07/20/2019 which showed suspicious palpable right breast mass, 3.4 x 2.5 x 3.4 cm.  Suspicious cortically thickened right axillary lymph node. Biopsy pathology showed: Right breast mass biopsy positive for invasive mammary carcinoma, no special type.  Grade 3, DCIS not identified, lymphovascular invasion not identified.  Right axillary lymph node positive for metastatic carcinoma. ER+, PR+, HER-2-  Nipple discharge: Denies Family history: Mother with cervical cancer. Estrogen and progesterone therapy: denies History of radiation to chest: denies.  Previous breast surgery: Denies Premenopausal status History of tubal ligation. She reports a history of abnormal uterine bleeding, anemia, history of blood transfusion.  GERD.  Kidney stone.  INTERVAL HISTORY Jessica [name] is a 43 y.o. 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 Patient has started neoadjuvant chemotherapy.  She got first cycle of ddAC at [facility] using [employer] scalp cooling system, under care of Dr.Feng. Plan is for her to return to [facility] once our facility has equipment for scalp cooling system. Today she presents for follow up of chemotherapy tolerability.   Fatigue: reports worsening fatigue for 2 to 3 days after received chemotherapy.  Mild shortness of breath  with exertion. Appetite is decreased.  She also had some bone pain after receiving growth factors. Mild nausea, she takes antiemetics which helps relieving her symptoms. She reports feeling chest discomfort, especially at night.  She has to sit up and eat something which helps to relieve the chest discomfort.  She attributes to acid reflux/indigestion.  Denies any pressure-like chest pain. No vomiting, diarrhea,  abdominal pain mouth sore.   Review of Systems  Constitutional: Positive for appetite change and fatigue. Negative for chills and fever.  HENT:   Negative for hearing loss and voice change.   Eyes: Negative for eye problems.  Respiratory: Negative for chest tightness and cough.   Cardiovascular: Negative for chest pain.  Gastrointestinal: Positive for nausea. Negative for abdominal distention, abdominal pain and blood in stool.  Endocrine: Negative for hot flashes.  Genitourinary: Negative for difficulty urinating and frequency.   Musculoskeletal: Negative for arthralgias.  Skin: Negative for itching and rash.  Neurological: Negative for extremity weakness.  Hematological: Negative for adenopathy.  Psychiatric/Behavioral: Negative for confusion. The patient is not nervous/anxious.     MEDICAL HISTORY:  Past Medical History:  Diagnosis Date  . Cancer [facility])    breast cancer  . Frequency   . Kidney stones   . Obesity   . Right flank pain     SURGICAL HISTORY: Past Surgical History:  Procedure Laterality Date  . CESAREAN SECTION     x 2  . CHOLECYSTECTOMY    . DILATION AND CURETTAGE OF UTERUS    . PORTACATH PLACEMENT Left 08/17/2019   Procedure: INSERTION PORT-A-CATH;  Surgeon: Benjamine Sprague, DO;  Location: [facility] ORS;  Service: General;  Laterality: Left;  . TUBAL LIGATION  SOCIAL HISTORY: Social History   Socioeconomic History  . Marital status: Single    Spouse name: Not on file  . Number of children: Not on file  . Years of education: Not on file  .  Highest education level: Not on file  Occupational History  . Not on file  Social Needs  . Financial resource strain: Not on file  . Food insecurity    Worry: Not on file    Inability: Not on file  . Transportation needs    Medical: Not on file    Non-medical: Not on file  Tobacco Use  . Smoking status: Never Smoker  . Smokeless tobacco: Never Used  Substance and Sexual Activity  . Alcohol use: No  . Drug use: No  . Sexual activity: Yes  Lifestyle  . Physical activity    Days per week: Not on file    Minutes per session: Not on file  . Stress: Not on file  Relationships  . Social Herbalist on phone: Not on file    Gets together: Not on file    Attends religious service: Not on file    Active member of club or organization: Not on file    Attends meetings of clubs or organizations: Not on file    Relationship status: Not on file  . Intimate partner violence    Fear of current or ex partner: Not on file    Emotionally abused: Not on file    Physically abused: Not on file    Forced sexual activity: Not on file  Other Topics Concern  . Not on file  Social History Narrative  . Not on file    FAMILY HISTORY: Family History  Problem Relation Age of Onset  . Kidney Stones Other   . Cervical cancer Mother   . Kidney Stones Mother   . Other Sister   . Breast cancer Neg Hx     ALLERGIES:  has No Known Allergies.  MEDICATIONS:  Current Outpatient Medications  Medication Sig Dispense Refill  . acetaminophen (TYLENOL) 325 MG tablet Take 2 tablets (650 mg total) by mouth every 8 (eight) hours as needed for mild pain. 40 tablet 0  . dexamethasone (DECADRON) 4 MG tablet Take 2 tablets by mouth once a day on the day after chemotherapy and then take 2 tablets two times a day for 2 days. Take with food. 30 tablet 1  . ferrous sulfate 325 (65 FE) MG EC tablet Take 1 tablet (325 mg total) by mouth 2 (two) times daily with a meal. 60 tablet 1  . gabapentin (NEURONTIN)  600 MG tablet Take 600 mg by mouth 3 (three) times daily.    Marland Kitchen lidocaine-prilocaine (EMLA) cream Apply to affected area once 30 g 3  . meloxicam (MOBIC) 15 MG tablet Take 15 mg by mouth daily.    Marland Kitchen nystatin (MYCOSTATIN) 100000 UNIT/ML suspension Take 5 mLs (500,000 Units total) by mouth 3 (three) times daily. Swish and spit. 60 mL 0  . omeprazole (PRILOSEC) 20 MG capsule Take 1 capsule (20 mg total) by mouth daily. 30 capsule 0  . ondansetron (ZOFRAN) 8 MG tablet Take 1 tablet (8 mg total) by mouth 2 (two) times daily as needed. Start on the third day after chemotherapy. (Patient not taking: Reported on 08/24/2019) 30 tablet 1  . prochlorperazine (COMPAZINE) 10 MG tablet Take 1 tablet (10 mg total) by mouth every 6 (six) hours as needed (Nausea or vomiting). (Patient not  taking: Reported on 08/24/2019) 30 tablet 1   No current facility-administered medications for this visit.      PHYSICAL EXAMINATION: ECOG PERFORMANCE STATUS: 0 - Asymptomatic Vitals:   08/25/19 1431  BP: 112/68  Pulse: 80  Resp: 18  Temp: (!) 97 F (36.1 C)   Filed Weights   08/25/19 1431  Weight: 213 lb 11.2 oz (96.9 kg)    Physical Exam Constitutional:      General: She is not in acute distress. HENT:     Head: Normocephalic and atraumatic.  Eyes:     General: No scleral icterus.    Pupils: Pupils are equal, round, and reactive to light.  Neck:     Musculoskeletal: Normal range of motion and neck supple.  Cardiovascular:     Rate and Rhythm: Normal rate and regular rhythm.     Heart sounds: Normal heart sounds.  Pulmonary:     Effort: Pulmonary effort is normal. No respiratory distress.     Breath sounds: No wheezing.  Abdominal:     General: Bowel sounds are normal. There is no distension.     Palpations: Abdomen is soft. There is no mass.     Tenderness: There is no abdominal tenderness.  Musculoskeletal: Normal range of motion.        General: No deformity.  Skin:    General: Skin is warm and  dry.     Findings: No erythema or rash.  Neurological:     Mental Status: She is alert and oriented to person, place, and time.     Cranial Nerves: No cranial nerve deficit.     Coordination: Coordination normal.  Psychiatric:        Behavior: Behavior normal.        Thought Content: Thought content normal.       LABORATORY DATA:  I have reviewed the data as listed Lab Results  Component Value Date   WBC 4.4 08/25/2019   HGB 10.9 (L) 08/25/2019   HCT 34.0 (L) 08/25/2019   MCV 85.6 08/25/2019   PLT 270 08/25/2019   Recent Labs    08/03/19 1207 08/18/19 0825 08/25/19 1413  NA 136 141 135  K 3.9 3.7 4.0  CL 105 106 103  CO2 '23 24 24  ' GLUCOSE 93 110* 129*  BUN '10 13 12  ' CREATININE 0.67 0.75 0.48  CALCIUM 8.9 9.1 9.2  GFRNONAA >60 >60 >60  GFRAA >60 >60 >60  PROT 8.0 7.3 6.8  ALBUMIN 3.7 3.5 3.7  AST 16 14* 13*  ALT '11 18 12  ' ALKPHOS 61 77 87  BILITOT 0.3 0.3 0.5   Iron/TIBC/Ferritin/ %Sat    Component Value Date/Time   IRON 63 08/25/2019 1413   TIBC 341 08/25/2019 1413   FERRITIN 156 08/25/2019 1413   IRONPCTSAT 19 08/25/2019 1413        ASSESSMENT & PLAN:  1. Invasive carcinoma of breast ([place])   2. Iron deficiency   3. Gastroesophageal reflux disease, unspecified whether esophagitis present   4. Nausea without vomiting   5. Other fatigue   Cancer Staging Malignant neoplasm of lower-inner quadrant of right breast of female, estrogen receptor positive ([place]) Staging form: Breast, AJCC 8th Edition - Clinical stage from 07/26/2019: Stage IIB (cT2, cN1, cM0, G3, ER+, PR+, HER2-) - Unsigned  #Clinical stage II right breast cancer, Patient is on neoadjuvant chemotherapy with DD AC.  Status post cycle one 1 week ago. Overall tolerated well with mild difficulties.  Labs are reviewed and  discussed with patient.  Counts are stable.  #Mild nausea.  Stable.  Continue antiemetics. #ANC 1.5.  Continue to monitor. #Fatigue/shortness of breath with exertion,  likely secondary to chemotherapy/anemia. #Iron deficiency anemia, patient has taken oral iron supplementation.  I will repeat iron panel today. Labs are reviewed.  Ferritin 156, iron saturation 19, TIBC 341. This is improved from 2 weeks ago.  I recommend patient to continue with oral iron supplementation for now. GERD, recommend trials of omeprazole 20 mg daily.  Prescription sent to pharmacy.  Orders Placed This Encounter  Procedures  . Iron and TIBC    Standing Status:   Future    Number of Occurrences:   1    Standing Expiration Date:   08/24/2020  . Ferritin    Standing Status:   Future    Number of Occurrences:   1    Standing Expiration Date:   08/24/2020    All questions were answered. The patient knows to call the clinic with any problems questions or concerns.  Return of visit: 1 week with [facility] Dr. Burr Medico for cycle 2 ddAC.   Earlie Server, MD, PhD Hematology Oncology [facility] at [facility] Pager- [phone]

## 2019-08-29 ENCOUNTER — Other Ambulatory Visit: Payer: Self-pay | Admitting: Oncology

## 2019-08-29 LAB — CANCER ANTIGEN 27.29: CA 27.29: 62.7 U/mL — ABNORMAL HIGH (ref 0.0–38.6)

## 2019-08-31 ENCOUNTER — Inpatient Hospital Stay: Payer: BC Managed Care – PPO

## 2019-08-31 ENCOUNTER — Encounter: Payer: Self-pay | Admitting: *Deleted

## 2019-08-31 ENCOUNTER — Other Ambulatory Visit: Payer: Self-pay

## 2019-08-31 ENCOUNTER — Inpatient Hospital Stay (HOSPITAL_BASED_OUTPATIENT_CLINIC_OR_DEPARTMENT_OTHER): Payer: BC Managed Care – PPO | Admitting: Hematology

## 2019-08-31 ENCOUNTER — Encounter: Payer: Self-pay | Admitting: Hematology

## 2019-08-31 VITALS — BP 116/76 | HR 99 | Temp 97.8°F | Resp 18 | Wt 215.8 lb

## 2019-08-31 DIAGNOSIS — Z170 Estrogen receptor positive status [ER+]: Secondary | ICD-10-CM | POA: Diagnosis not present

## 2019-08-31 DIAGNOSIS — Z95828 Presence of other vascular implants and grafts: Secondary | ICD-10-CM

## 2019-08-31 DIAGNOSIS — C50311 Malignant neoplasm of lower-inner quadrant of right female breast: Secondary | ICD-10-CM

## 2019-08-31 DIAGNOSIS — C50919 Malignant neoplasm of unspecified site of unspecified female breast: Secondary | ICD-10-CM

## 2019-08-31 LAB — CBC WITH DIFFERENTIAL/PLATELET
Abs Immature Granulocytes: 0.57 10*3/uL — ABNORMAL HIGH (ref 0.00–0.07)
Basophils Absolute: 0.1 10*3/uL (ref 0.0–0.1)
Basophils Relative: 1 %
Eosinophils Absolute: 0.1 10*3/uL (ref 0.0–0.5)
Eosinophils Relative: 1 %
HCT: 35.1 % — ABNORMAL LOW (ref 36.0–46.0)
Hemoglobin: 11.2 g/dL — ABNORMAL LOW (ref 12.0–15.0)
Immature Granulocytes: 6 %
Lymphocytes Relative: 25 %
Lymphs Abs: 2.5 10*3/uL (ref 0.7–4.0)
MCH: 27.9 pg (ref 26.0–34.0)
MCHC: 31.9 g/dL (ref 30.0–36.0)
MCV: 87.3 fL (ref 80.0–100.0)
Monocytes Absolute: 0.6 10*3/uL (ref 0.1–1.0)
Monocytes Relative: 6 %
Neutro Abs: 6 10*3/uL (ref 1.7–7.7)
Neutrophils Relative %: 61 %
Platelets: 215 10*3/uL (ref 150–400)
RBC: 4.02 MIL/uL (ref 3.87–5.11)
RDW: 13.3 % (ref 11.5–15.5)
WBC: 9.7 10*3/uL (ref 4.0–10.5)
nRBC: 0.3 % — ABNORMAL HIGH (ref 0.0–0.2)

## 2019-08-31 LAB — COMPREHENSIVE METABOLIC PANEL
ALT: 11 U/L (ref 0–44)
AST: 8 U/L — ABNORMAL LOW (ref 15–41)
Albumin: 3.6 g/dL (ref 3.5–5.0)
Alkaline Phosphatase: 97 U/L (ref 38–126)
Anion gap: 9 (ref 5–15)
BUN: 10 mg/dL (ref 6–20)
CO2: 24 mmol/L (ref 22–32)
Calcium: 9 mg/dL (ref 8.9–10.3)
Chloride: 105 mmol/L (ref 98–111)
Creatinine, Ser: 0.75 mg/dL (ref 0.44–1.00)
GFR calc Af Amer: >60 mL/min (ref >60)
GFR calc non Af Amer: >60 mL/min (ref >60)
Glucose, Bld: 135 mg/dL — ABNORMAL HIGH (ref 70–99)
Potassium: 3.8 mmol/L (ref 3.5–5.1)
Sodium: 138 mmol/L (ref 135–145)
Total Bilirubin: <0.2 mg/dL — ABNORMAL LOW (ref 0.3–1.2)
Total Protein: 7 g/dL (ref 6.5–8.1)

## 2019-08-31 MED ORDER — SODIUM CHLORIDE 0.9% FLUSH
10.0000 mL | INTRAVENOUS | Status: DC | PRN
  Administered 2019-08-31: 10 mL via INTRAVENOUS
  Filled 2019-08-31: qty 10

## 2019-08-31 MED ORDER — SODIUM CHLORIDE 0.9 % IV SOLN
600.0000 mg/m2 | Freq: Once | INTRAVENOUS | Status: AC
  Administered 2019-08-31: 1260 mg via INTRAVENOUS
  Filled 2019-08-31: qty 63

## 2019-08-31 MED ORDER — SODIUM CHLORIDE 0.9 % IV SOLN
Freq: Once | INTRAVENOUS | Status: AC
  Administered 2019-08-31: 10:00:00 via INTRAVENOUS
  Filled 2019-08-31: qty 5

## 2019-08-31 MED ORDER — DOXORUBICIN HCL CHEMO IV INJECTION 2 MG/ML
60.0000 mg/m2 | Freq: Once | INTRAVENOUS | Status: AC
  Administered 2019-08-31: 11:00:00 126 mg via INTRAVENOUS
  Filled 2019-08-31: qty 63

## 2019-08-31 MED ORDER — SODIUM CHLORIDE 0.9% FLUSH
10.0000 mL | INTRAVENOUS | Status: DC | PRN
  Administered 2019-08-31: 10 mL
  Filled 2019-08-31: qty 10

## 2019-08-31 MED ORDER — PALONOSETRON HCL INJECTION 0.25 MG/5ML
INTRAVENOUS | Status: AC
  Filled 2019-08-31: qty 5

## 2019-08-31 MED ORDER — PALONOSETRON HCL INJECTION 0.25 MG/5ML
0.2500 mg | Freq: Once | INTRAVENOUS | Status: AC
  Administered 2019-08-31: 0.25 mg via INTRAVENOUS

## 2019-08-31 MED ORDER — HEPARIN SOD (PORK) LOCK FLUSH 100 UNIT/ML IV SOLN
500.0000 [IU] | Freq: Once | INTRAVENOUS | Status: AC | PRN
  Administered 2019-08-31: 500 [IU]
  Filled 2019-08-31: qty 5

## 2019-08-31 MED ORDER — SODIUM CHLORIDE 0.9 % IV SOLN
Freq: Once | INTRAVENOUS | Status: AC
  Administered 2019-08-31: 10:00:00 via INTRAVENOUS
  Filled 2019-08-31: qty 250

## 2019-08-31 NOTE — Patient Instructions (Signed)

## 2019-08-31 NOTE — Patient Instructions (Signed)
[facility] Discharge Instructions for Patients Receiving Chemotherapy  **You received Aloxi as a premedication for your treatment today.  Do not take ondansetron (Zofran) for 3 days. The next time you can take zofran will be Monday afternoon.   Today you received the following chemotherapy agents: doxorubicin and cyclophosphamide.  To help prevent nausea and vomiting after your treatment, we encourage you to take your nausea medication as prescribed by your physician.    If you develop nausea and vomiting that is not controlled by your nausea medication, call the clinic.   BELOW ARE SYMPTOMS THAT SHOULD BE REPORTED IMMEDIATELY:  *FEVER GREATER THAN 100.5 F  *CHILLS WITH OR WITHOUT FEVER  NAUSEA AND VOMITING THAT IS NOT CONTROLLED WITH YOUR NAUSEA MEDICATION  *UNUSUAL SHORTNESS OF BREATH  *UNUSUAL BRUISING OR BLEEDING  TENDERNESS IN MOUTH AND THROAT WITH OR WITHOUT PRESENCE OF ULCERS  *URINARY PROBLEMS  *BOWEL PROBLEMS  UNUSUAL RASH Items with * indicate a potential emergency and should be followed up as soon as possible.  Feel free to call the clinic should you have any questions or concerns. The clinic phone number is [phone].  Please show the Branch at check-in to the Emergency Department and triage nurse.  Doxorubicin injection What is this medicine? DOXORUBICIN (dox oh ROO bi sin) is a chemotherapy drug. It is used to treat many kinds of cancer like leukemia, lymphoma, neuroblastoma, sarcoma, and Wilms' tumor. It is also used to treat bladder cancer, breast cancer, lung cancer, ovarian cancer, stomach cancer, and thyroid cancer. This medicine may be used for other purposes; ask your health care provider or pharmacist if you have questions. COMMON BRAND NAME(S): Adriamycin, Adriamycin PFS, Adriamycin RDF, Rubex What should I tell my health care provider before I take this medicine? They need to know if you have any of these  conditions:  heart disease  history of low blood counts caused by a medicine  liver disease  recent or ongoing radiation therapy  an unusual or allergic reaction to doxorubicin, other chemotherapy agents, other medicines, foods, dyes, or preservatives  pregnant or trying to get pregnant  breast-feeding How should I use this medicine? This drug is given as an infusion into a vein. It is administered in a hospital or clinic by a specially trained health care professional. If you have pain, swelling, burning or any unusual feeling around the site of your injection, tell your health care professional right away. Talk to your pediatrician regarding the use of this medicine in children. Special care may be needed. Overdosage: If you think you have taken too much of this medicine contact a poison control center or emergency room at once. NOTE: This medicine is only for you. Do not share this medicine with others. What if I miss a dose? It is important not to miss your dose. Call your doctor or health care professional if you are unable to keep an appointment. What may interact with this medicine? This medicine may interact with the following medications:  6-mercaptopurine  paclitaxel  phenytoin  St. John's Wort  trastuzumab  verapamil This list may not describe all possible interactions. Give your health care provider a list of all the medicines, herbs, non-prescription drugs, or dietary supplements you use. Also tell them if you smoke, drink alcohol, or use illegal drugs. Some items may interact with your medicine. What should I watch for while using this medicine? This drug may make you feel generally unwell. This is not uncommon, as  chemotherapy can affect healthy cells as well as cancer cells. Report any side effects. Continue your course of treatment even though you feel ill unless your doctor tells you to stop. There is a maximum amount of this medicine you should receive  throughout your life. The amount depends on the medical condition being treated and your overall health. Your doctor will watch how much of this medicine you receive in your lifetime. Tell your doctor if you have taken this medicine before. You may need blood work done while you are taking this medicine. Your urine may turn red for a few days after your dose. This is not blood. If your urine is dark or brown, call your doctor. In some cases, you may be given additional medicines to help with side effects. Follow all directions for their use. Call your doctor or health care professional for advice if you get a fever, chills or sore throat, or other symptoms of a cold or flu. Do not treat yourself. This drug decreases your body's ability to fight infections. Try to avoid being around people who are sick. This medicine may increase your risk to bruise or bleed. Call your doctor or health care professional if you notice any unusual bleeding. Talk to your doctor about your risk of cancer. You may be more at risk for certain types of cancers if you take this medicine. Do not become pregnant while taking this medicine or for 6 months after stopping it. Women should inform their doctor if they wish to become pregnant or think they might be pregnant. Men should not father a child while taking this medicine and for 6 months after stopping it. There is a potential for serious side effects to an unborn child. Talk to your health care professional or pharmacist for more information. Do not breast-feed an infant while taking this medicine. This medicine has caused ovarian failure in some women and reduced sperm counts in some men This medicine may interfere with the ability to have a child. Talk with your doctor or health care professional if you are concerned about your fertility. This medicine may cause a decrease in Co-Enzyme Q-10. You should make sure that you get enough Co-Enzyme Q-10 while you are taking this  medicine. Discuss the foods you eat and the vitamins you take with your health care professional. What side effects may I notice from receiving this medicine? Side effects that you should report to your doctor or health care professional as soon as possible:  allergic reactions like skin rash, itching or hives, swelling of the face, lips, or tongue  breathing problems  chest pain  fast or irregular heartbeat  low blood counts - this medicine may decrease the number of white blood cells, red blood cells and platelets. You may be at increased risk for infections and bleeding.  pain, redness, or irritation at site where injected  signs of infection - fever or chills, cough, sore throat, pain or difficulty passing urine  signs of decreased platelets or bleeding - bruising, pinpoint red spots on the skin, black, tarry stools, blood in the urine  swelling of the ankles, feet, hands  tiredness  weakness Side effects that usually do not require medical attention (report to your doctor or health care professional if they continue or are bothersome):  diarrhea  hair loss  mouth sores  nail discoloration or damage  nausea  red colored urine  vomiting This list may not describe all possible side effects. Call your doctor for  medical advice about side effects. You may report side effects to FDA at 1-800-FDA-1088. Where should I keep my medicine? This drug is given in a hospital or clinic and will not be stored at home. NOTE: This sheet is a summary. It may not cover all possible information. If you have questions about this medicine, talk to your doctor, pharmacist, or health care provider.  2020 Elsevier/Gold Standard (2017-06-02 11:01:26)  Cyclophosphamide injection What is this medicine? CYCLOPHOSPHAMIDE (sye kloe FOSS fa mide) is a chemotherapy drug. It slows the growth of cancer cells. This medicine is used to treat many types of cancer like lymphoma, myeloma, leukemia,  breast cancer, and ovarian cancer, to name a few. This medicine may be used for other purposes; ask your health care provider or pharmacist if you have questions. COMMON BRAND NAME(S): Cytoxan, Neosar What should I tell my health care provider before I take this medicine? They need to know if you have any of these conditions:  blood disorders  history of other chemotherapy  infection  kidney disease  liver disease  recent or ongoing radiation therapy  tumors in the bone marrow  an unusual or allergic reaction to cyclophosphamide, other chemotherapy, other medicines, foods, dyes, or preservatives  pregnant or trying to get pregnant  breast-feeding How should I use this medicine? This drug is usually given as an injection into a vein or muscle or by infusion into a vein. It is administered in a hospital or clinic by a specially trained health care professional. Talk to your pediatrician regarding the use of this medicine in children. Special care may be needed. Overdosage: If you think you have taken too much of this medicine contact a poison control center or emergency room at once. NOTE: This medicine is only for you. Do not share this medicine with others. What if I miss a dose? It is important not to miss your dose. Call your doctor or health care professional if you are unable to keep an appointment. What may interact with this medicine? This medicine may interact with the following medications:  amiodarone  amphotericin B  azathioprine  certain antiviral medicines for HIV or AIDS such as protease inhibitors (e.g., indinavir, ritonavir) and zidovudine  certain blood pressure medications such as benazepril, captopril, enalapril, fosinopril, lisinopril, moexipril, monopril, perindopril, quinapril, ramipril, trandolapril  certain cancer medications such as anthracyclines (e.g., daunorubicin, doxorubicin), busulfan, cytarabine, paclitaxel, pentostatin, tamoxifen,  trastuzumab  certain diuretics such as chlorothiazide, chlorthalidone, hydrochlorothiazide, indapamide, metolazone  certain medicines that treat or prevent blood clots like warfarin  certain muscle relaxants such as succinylcholine  cyclosporine  etanercept  indomethacin  medicines to increase blood counts like filgrastim, pegfilgrastim, sargramostim  medicines used as general anesthesia  metronidazole  natalizumab This list may not describe all possible interactions. Give your health care provider a list of all the medicines, herbs, non-prescription drugs, or dietary supplements you use. Also tell them if you smoke, drink alcohol, or use illegal drugs. Some items may interact with your medicine. What should I watch for while using this medicine? Visit your doctor for checks on your progress. This drug may make you feel generally unwell. This is not uncommon, as chemotherapy can affect healthy cells as well as cancer cells. Report any side effects. Continue your course of treatment even though you feel ill unless your doctor tells you to stop. Drink water or other fluids as directed. Urinate often, even at night. In some cases, you may be given additional medicines to help with  side effects. Follow all directions for their use. Call your doctor or health care professional for advice if you get a fever, chills or sore throat, or other symptoms of a cold or flu. Do not treat yourself. This drug decreases your body's ability to fight infections. Try to avoid being around people who are sick. This medicine may increase your risk to bruise or bleed. Call your doctor or health care professional if you notice any unusual bleeding. Be careful brushing and flossing your teeth or using a toothpick because you may get an infection or bleed more easily. If you have any dental work done, tell your dentist you are receiving this medicine. You may get drowsy or dizzy. Do not drive, use machinery, or do  anything that needs mental alertness until you know how this medicine affects you. Do not become pregnant while taking this medicine or for 1 year after stopping it. Women should inform their doctor if they wish to become pregnant or think they might be pregnant. Men should not father a child while taking this medicine and for 4 months after stopping it. There is a potential for serious side effects to an unborn child. Talk to your health care professional or pharmacist for more information. Do not breast-feed an infant while taking this medicine. This medicine may interfere with the ability to have a child. This medicine has caused ovarian failure in some women. This medicine has caused reduced sperm counts in some men. You should talk with your doctor or health care professional if you are concerned about your fertility. If you are going to have surgery, tell your doctor or health care professional that you have taken this medicine. What side effects may I notice from receiving this medicine? Side effects that you should report to your doctor or health care professional as soon as possible:  allergic reactions like skin rash, itching or hives, swelling of the face, lips, or tongue  low blood counts - this medicine may decrease the number of white blood cells, red blood cells and platelets. You may be at increased risk for infections and bleeding.  signs of infection - fever or chills, cough, sore throat, pain or difficulty passing urine  signs of decreased platelets or bleeding - bruising, pinpoint red spots on the skin, black, tarry stools, blood in the urine  signs of decreased red blood cells - unusually weak or tired, fainting spells, lightheadedness  breathing problems  dark urine  dizziness  palpitations  swelling of the ankles, feet, hands  trouble passing urine or change in the amount of urine  weight gain  yellowing of the eyes or skin Side effects that usually do not  require medical attention (report to your doctor or health care professional if they continue or are bothersome):  changes in nail or skin color  hair loss  missed menstrual periods  mouth sores  nausea, vomiting This list may not describe all possible side effects. Call your doctor for medical advice about side effects. You may report side effects to FDA at 1-800-FDA-1088. Where should I keep my medicine? This drug is given in a hospital or clinic and will not be stored at home. NOTE: This sheet is a summary. It may not cover all possible information. If you have questions about this medicine, talk to your doctor, pharmacist, or health care provider.  2020 Elsevier/Gold Standard (2012-09-02 16:22:58)

## 2019-08-31 NOTE — Progress Notes (Signed)
Met with patient in infusion to introduce myself as Financial Resource Specialist and to offer available resources. ° °Discussed one-time $1000 [employer] grant and qualifications to assist with personal expenses while going through treatment. ° °Gave her my card if interested in applying and for any additional financial questions or concerns. She states she will call me today. Will also discuss copay assistance if needed for injection.  °

## 2019-09-01 ENCOUNTER — Telehealth: Payer: Self-pay | Admitting: Hematology

## 2019-09-01 NOTE — Telephone Encounter (Signed)
No los per 10/29

## 2019-09-02 ENCOUNTER — Inpatient Hospital Stay: Payer: BC Managed Care – PPO

## 2019-09-02 ENCOUNTER — Encounter: Payer: Self-pay | Admitting: Hematology

## 2019-09-02 VITALS — BP 124/81 | HR 84 | Temp 98.0°F | Resp 18

## 2019-09-02 DIAGNOSIS — C50311 Malignant neoplasm of lower-inner quadrant of right female breast: Secondary | ICD-10-CM | POA: Diagnosis not present

## 2019-09-02 MED ORDER — PEGFILGRASTIM-CBQV 6 MG/0.6ML ~~LOC~~ SOSY
6.0000 mg | PREFILLED_SYRINGE | Freq: Once | SUBCUTANEOUS | Status: AC
  Administered 2019-09-02: 6 mg via SUBCUTANEOUS

## 2019-09-04 ENCOUNTER — Inpatient Hospital Stay: Payer: BC Managed Care – PPO | Attending: Hematology

## 2019-09-04 ENCOUNTER — Other Ambulatory Visit: Payer: Self-pay

## 2019-09-04 DIAGNOSIS — Z7689 Persons encountering health services in other specified circumstances: Secondary | ICD-10-CM | POA: Insufficient documentation

## 2019-09-04 DIAGNOSIS — D509 Iron deficiency anemia, unspecified: Secondary | ICD-10-CM | POA: Insufficient documentation

## 2019-09-04 DIAGNOSIS — C50311 Malignant neoplasm of lower-inner quadrant of right female breast: Secondary | ICD-10-CM | POA: Insufficient documentation

## 2019-09-04 DIAGNOSIS — R110 Nausea: Secondary | ICD-10-CM | POA: Insufficient documentation

## 2019-09-04 DIAGNOSIS — R5383 Other fatigue: Secondary | ICD-10-CM | POA: Insufficient documentation

## 2019-09-04 DIAGNOSIS — R05 Cough: Secondary | ICD-10-CM | POA: Insufficient documentation

## 2019-09-04 DIAGNOSIS — Z170 Estrogen receptor positive status [ER+]: Secondary | ICD-10-CM | POA: Insufficient documentation

## 2019-09-04 DIAGNOSIS — Z79899 Other long term (current) drug therapy: Secondary | ICD-10-CM | POA: Insufficient documentation

## 2019-09-04 DIAGNOSIS — Z5111 Encounter for antineoplastic chemotherapy: Secondary | ICD-10-CM | POA: Insufficient documentation

## 2019-09-04 DIAGNOSIS — Z791 Long term (current) use of non-steroidal anti-inflammatories (NSAID): Secondary | ICD-10-CM | POA: Insufficient documentation

## 2019-09-05 ENCOUNTER — Encounter: Payer: Self-pay | Admitting: *Deleted

## 2019-09-05 NOTE — Progress Notes (Signed)
Called and talked with patient today.  States she is tolerating this chemo treatment better than the first one.  States so far, she has no hair loss.  Wants to return to [employer] with [employer] is available.  Asked when we are getting ours.  Informed that hopefully nurses are getting trained this month.  States her 4th treatment will be due the week of Thanksgiving.  She's to call me back after next treatment to assist with coordinating return to [place] if possible for last [facility] treatment.  No other needs at this time.

## 2019-09-13 NOTE — Progress Notes (Signed)
[facility]   [phone] [phone]   Clinic Follow up Note   Patient Care Team: Ricardo Jericho, NP as PCP - General (Family Medicine) Rico Junker, RN as Registered Nurse  Date of Service:  09/15/2019  CHIEF COMPLAINT: F/u of right breast cancer   SUMMARY OF ONCOLOGIC HISTORY: Oncology History Overview Note  Cancer Staging Malignant neoplasm of lower-inner quadrant of right breast of female, estrogen receptor positive ([place]) Staging form: Breast, AJCC 8th Edition - Clinical stage from 07/26/2019: Stage IIB (cT2, cN1, cM0, G3, ER+, PR+, HER2-) - Unsigned    Malignant neoplasm of lower-inner quadrant of right breast of female, estrogen receptor positive ([place])  07/20/2019 Mammogram   Mammogram 07/20/19  IMPRESSION: Suspicious palpable right breast mass 3.4 x 2.5 x 3.4 cm 5 o'clock position 3 cm from nipple.   Suspicious 1.6cm cortically thickened right axillary lymph node.   07/26/2019 Initial Diagnosis   DIAGNOSIS: 07/26/19 A. RIGHT BREAST, 5:00, 3CMFN; ULTRASOUND-GUIDED NEEDLE CORE BIOPSY:  - INVASIVE MAMMARY CARCINOMA, NO SPECIAL TYPE.    07/26/2019 Receptors her2   BREAST BIOMARKER TESTS  Estrogen Receptor (ER) Status: POSITIVE                       Percentage of cells with nuclear positivity: 51-90%                       Average intensity of staining: Strong   Progesterone Receptor (PgR) Status: POSITIVE                       Percentage of cells with nuclear positivity:  Greater than 90%                       Average intensity of staining: Strong   HER2 (by immunohistochemistry): NEGATIVE (Score 1+)    08/05/2019 Initial Diagnosis   Invasive carcinoma of breast ([place])   08/17/2019 Surgery   INSERTION PORT-A-CATH by Dr. Lysle Pearl 08/17/19    08/18/2019 -  Chemotherapy   AC q2weeks for 4 cycles starting 08/18/19 followed by weekly Taxol for 12 weeks       CURRENT THERAPY:  Neoadjuvant chemo ddAC starting 08/18/19 for 4  cycles followed by weekly Taxol   INTERVAL HISTORY:  Jessica [name] is here for a follow up and treatment.  She presents to the clinic by herself.  Doing well overall, has been tolerating chemotherapy well.  She had a mild bone aches from Udenyca injection, tolerable.  No fever, chills, nausea, diarrhea, or other complaints.  Mild fatigue from chemo, she noticed occasional cough, with small amount of clear sputum.  No chest pain or shortness of breath.  She noticed her right breast mass has decreased in size since she started chemo.  The review of systems otherwise negative.   MEDICAL HISTORY:  Past Medical History:  Diagnosis Date  . Cancer [facility])    breast cancer  . Frequency   . Kidney stones   . Obesity   . Right flank pain     SURGICAL HISTORY: Past Surgical History:  Procedure Laterality Date  . CESAREAN SECTION     x 2  . CHOLECYSTECTOMY    . DILATION AND CURETTAGE OF UTERUS    . PORTACATH PLACEMENT Left 08/17/2019   Procedure: INSERTION PORT-A-CATH;  Surgeon: Benjamine Sprague, DO;  Location: [facility] ORS;  Service: General;  Laterality: Left;  . TUBAL  LIGATION      I have reviewed the social history and family history with the patient and they are unchanged from previous note.  ALLERGIES:  has No Known Allergies.  MEDICATIONS:  Current Outpatient Medications  Medication Sig Dispense Refill  . acetaminophen (TYLENOL) 325 MG tablet Take 2 tablets (650 mg total) by mouth every 8 (eight) hours as needed for mild pain. 40 tablet 0  . ferrous sulfate 325 (65 FE) MG EC tablet TAKE 1 TABLET (325 MG TOTAL) BY MOUTH 2 (TWO) TIMES DAILY WITH A MEAL. 60 tablet 1  . gabapentin (NEURONTIN) 600 MG tablet Take 600 mg by mouth 3 (three) times daily.    Marland Kitchen lidocaine-prilocaine (EMLA) cream Apply to affected area once 30 g 3  . meloxicam (MOBIC) 15 MG tablet Take 15 mg by mouth daily.    Marland Kitchen omeprazole (PRILOSEC) 20 MG capsule Take 1 capsule (20 mg total) by mouth daily. 30 capsule 0  .  prochlorperazine (COMPAZINE) 10 MG tablet Take 1 tablet (10 mg total) by mouth every 6 (six) hours as needed (Nausea or vomiting). 30 tablet 1  . dexamethasone (DECADRON) 4 MG tablet Take 2 tablets by mouth once a day on the day after chemotherapy and then take 2 tablets two times a day for 2 days. Take with food. 30 tablet 1  . nystatin (MYCOSTATIN) 100000 UNIT/ML suspension Take 5 mLs (500,000 Units total) by mouth 3 (three) times daily. Swish and spit. 60 mL 0  . ondansetron (ZOFRAN) 8 MG tablet Take 1 tablet (8 mg total) by mouth 2 (two) times daily as needed. Start on the third day after chemotherapy. (Patient not taking: Reported on 08/24/2019) 30 tablet 1   No current facility-administered medications for this visit.     PHYSICAL EXAMINATION: ECOG PERFORMANCE STATUS: 0 - Asymptomatic  Vitals:   09/15/19 0933  BP: 112/79  Pulse: 83  Resp: 18  Temp: 98.3 F (36.8 C)  SpO2: 99%   Filed Weights   09/15/19 0933  Weight: 219 lb 3.2 oz (99.4 kg)   GENERAL:alert, no distress and comfortable SKIN: skin color, texture, turgor are normal, no rashes or significant lesions EYES: normal, Conjunctiva are pink and non-injected, sclera clear NECK: supple, thyroid normal size, non-tender, without nodularity LYMPH:  no palpable lymphadenopathy in the cervical, axillary  LUNGS: clear to auscultation and percussion with normal breathing effort HEART: regular rate & rhythm and no murmurs and no lower extremity edema ABDOMEN:abdomen soft, non-tender and normal bowel sounds Musculoskeletal:no cyanosis of digits and no clubbing  NEURO: alert & oriented x 3 with fluent speech, no focal motor/sensory deficits Breasts: Breast inspection showed them to be symmetrical with no nipple discharge. Palpation of the breasts and axilla revealed a 3.5X1.5cm mass in lower-inner quadrant of right breast, and a 1.5 cm lymph node in the right axilla, no other obvious mass that I could appreciate.   LABORATORY  DATA:  I have reviewed the data as listed CBC Latest Ref Rng & Units 09/15/2019 08/31/2019 08/25/2019  WBC 4.0 - 10.5 K/uL 13.2(H) 9.7 4.4  Hemoglobin 12.0 - 15.0 g/dL 11.1(L) 11.2(L) 10.9(L)  Hematocrit 36.0 - 46.0 % 35.2(L) 35.1(L) 34.0(L)  Platelets 150 - 400 K/uL 276 215 270     CMP Latest Ref Rng & Units 08/31/2019 08/25/2019 08/18/2019  Glucose 70 - 99 mg/dL 135(H) 129(H) 110(H)  BUN 6 - 20 mg/dL '10 12 13  ' Creatinine 0.44 - 1.00 mg/dL 0.75 0.48 0.75  Sodium 135 - 145 mmol/L 138  135 141  Potassium 3.5 - 5.1 mmol/L 3.8 4.0 3.7  Chloride 98 - 111 mmol/L 105 103 106  CO2 22 - 32 mmol/L '24 24 24  ' Calcium 8.9 - 10.3 mg/dL 9.0 9.2 9.1  Total Protein 6.5 - 8.1 g/dL 7.0 6.8 7.3  Total Bilirubin 0.3 - 1.2 mg/dL <0.2(L) 0.5 0.3  Alkaline Phos 38 - 126 U/L 97 87 77  AST 15 - 41 U/L 8(L) 13(L) 14(L)  ALT 0 - 44 U/L '11 12 18      ' RADIOGRAPHIC STUDIES: I have personally reviewed the radiological images as listed and agreed with the findings in the report. No results found.   ASSESSMENT & PLAN:  Jessica [name] is a 43 y.o. female with   1. Invasive right breast cancer, StageIIb, c(T2N1M0), ER/PR+, HER2-, GradeIII -She was recently diagnosed in 07/2019 with 3.4cm grade III invasive mammary carcinoma, node positive. With this tumor size and locally advancedhigh gradedisease she has a higher risk of cancer recurrence after curative surgery.  -Dr. Tasia Catchings recommended Neoadjuvant chemo withdose denseAdriamycin and Cytoxan q2weeks for 4 cycles followed by weekly Taxol for 12 weeks which she started on 08/18/19.  -s/p C2 AC she is tolerating chemotherapy very well, exam showed the right breast has decreased in size, the right axillary lymph node remains to be about the same, still palpable.  Clinically responding to chemotherapy. -Lab reviewed, adequate for treatment, will proceed with cycle 3 AC today, Udenyca on day 3. -Dr. Collie Siad office is setting up [place], she may get her next cycle AC  at Dr. Collie Siad office or here    2. Iron deficient anemia  -I previously advised her to restartoral iron BID. Will monitor on chemo. -If oral iron not sufficient, will consider IV iron. -anemia improved and stable.  Mild anemia is probably related to chemotherapy now.  PLAN: -Labs reviewed and adequate to proceed with C2 AC todaywith Udenyca tomorrow  -Lab, flush, f/u and AC in 2 weeks, she may return to Dr. Collie Siad office for next cycle chemo if [employer] is set up    No problem-specific Assessment & Plan notes found for this encounter.   No orders of the defined types were placed in this encounter.  All questions were answered. The patient knows to call the clinic with any problems, questions or concerns. No barriers to learning was detected.     Truitt Merle, MD 09/15/2019   I, Joslyn Devon, am acting as scribe for Truitt Merle, MD.   I have reviewed the above documentation for accuracy and completeness, and I agree with the above.

## 2019-09-14 ENCOUNTER — Encounter: Payer: Self-pay | Admitting: *Deleted

## 2019-09-15 ENCOUNTER — Other Ambulatory Visit: Payer: Self-pay

## 2019-09-15 ENCOUNTER — Encounter: Payer: Self-pay | Admitting: Hematology

## 2019-09-15 ENCOUNTER — Inpatient Hospital Stay: Payer: BC Managed Care – PPO

## 2019-09-15 ENCOUNTER — Inpatient Hospital Stay (HOSPITAL_BASED_OUTPATIENT_CLINIC_OR_DEPARTMENT_OTHER): Payer: BC Managed Care – PPO | Admitting: Hematology

## 2019-09-15 ENCOUNTER — Telehealth: Payer: Self-pay

## 2019-09-15 VITALS — BP 112/79 | HR 83 | Temp 98.3°F | Resp 18 | Ht 63.0 in | Wt 219.2 lb

## 2019-09-15 DIAGNOSIS — R5383 Other fatigue: Secondary | ICD-10-CM | POA: Diagnosis not present

## 2019-09-15 DIAGNOSIS — C50919 Malignant neoplasm of unspecified site of unspecified female breast: Secondary | ICD-10-CM | POA: Diagnosis not present

## 2019-09-15 DIAGNOSIS — C50311 Malignant neoplasm of lower-inner quadrant of right female breast: Secondary | ICD-10-CM | POA: Diagnosis not present

## 2019-09-15 DIAGNOSIS — Z791 Long term (current) use of non-steroidal anti-inflammatories (NSAID): Secondary | ICD-10-CM | POA: Diagnosis not present

## 2019-09-15 DIAGNOSIS — Z95828 Presence of other vascular implants and grafts: Secondary | ICD-10-CM

## 2019-09-15 DIAGNOSIS — Z170 Estrogen receptor positive status [ER+]: Secondary | ICD-10-CM

## 2019-09-15 DIAGNOSIS — R05 Cough: Secondary | ICD-10-CM | POA: Diagnosis not present

## 2019-09-15 DIAGNOSIS — R110 Nausea: Secondary | ICD-10-CM | POA: Diagnosis not present

## 2019-09-15 DIAGNOSIS — Z79899 Other long term (current) drug therapy: Secondary | ICD-10-CM | POA: Diagnosis not present

## 2019-09-15 DIAGNOSIS — Z7689 Persons encountering health services in other specified circumstances: Secondary | ICD-10-CM | POA: Diagnosis not present

## 2019-09-15 DIAGNOSIS — Z5111 Encounter for antineoplastic chemotherapy: Secondary | ICD-10-CM | POA: Diagnosis not present

## 2019-09-15 DIAGNOSIS — D509 Iron deficiency anemia, unspecified: Secondary | ICD-10-CM | POA: Diagnosis not present

## 2019-09-15 LAB — CBC WITH DIFFERENTIAL (CANCER CENTER ONLY)
Abs Immature Granulocytes: 1.28 10*3/uL — ABNORMAL HIGH (ref 0.00–0.07)
Basophils Absolute: 0.1 10*3/uL (ref 0.0–0.1)
Basophils Relative: 1 %
Eosinophils Absolute: 0 10*3/uL (ref 0.0–0.5)
Eosinophils Relative: 0 %
HCT: 35.2 % — ABNORMAL LOW (ref 36.0–46.0)
Hemoglobin: 11.1 g/dL — ABNORMAL LOW (ref 12.0–15.0)
Immature Granulocytes: 10 %
Lymphocytes Relative: 14 %
Lymphs Abs: 1.8 10*3/uL (ref 0.7–4.0)
MCH: 28.2 pg (ref 26.0–34.0)
MCHC: 31.5 g/dL (ref 30.0–36.0)
MCV: 89.6 fL (ref 80.0–100.0)
Monocytes Absolute: 1.1 10*3/uL — ABNORMAL HIGH (ref 0.1–1.0)
Monocytes Relative: 8 %
Neutro Abs: 8.9 10*3/uL — ABNORMAL HIGH (ref 1.7–7.7)
Neutrophils Relative %: 67 %
Platelet Count: 276 10*3/uL (ref 150–400)
RBC: 3.93 MIL/uL (ref 3.87–5.11)
RDW: 15.5 % (ref 11.5–15.5)
WBC Count: 13.2 10*3/uL — ABNORMAL HIGH (ref 4.0–10.5)
nRBC: 0.5 % — ABNORMAL HIGH (ref 0.0–0.2)

## 2019-09-15 LAB — CMP (CANCER CENTER ONLY)
ALT: 12 U/L (ref 0–44)
AST: 10 U/L — ABNORMAL LOW (ref 15–41)
Albumin: 4 g/dL (ref 3.5–5.0)
Alkaline Phosphatase: 110 U/L (ref 38–126)
Anion gap: 9 (ref 5–15)
BUN: 11 mg/dL (ref 6–20)
CO2: 27 mmol/L (ref 22–32)
Calcium: 9.2 mg/dL (ref 8.9–10.3)
Chloride: 105 mmol/L (ref 98–111)
Creatinine: 0.74 mg/dL (ref 0.44–1.00)
GFR, Est AFR Am: >60 mL/min (ref >60)
GFR, Estimated: >60 mL/min (ref >60)
Glucose, Bld: 89 mg/dL (ref 70–99)
Potassium: 3.9 mmol/L (ref 3.5–5.1)
Sodium: 141 mmol/L (ref 135–145)
Total Bilirubin: 0.2 mg/dL — ABNORMAL LOW (ref 0.3–1.2)
Total Protein: 7.2 g/dL (ref 6.5–8.1)

## 2019-09-15 MED ORDER — PALONOSETRON HCL INJECTION 0.25 MG/5ML
INTRAVENOUS | Status: AC
  Filled 2019-09-15: qty 5

## 2019-09-15 MED ORDER — SODIUM CHLORIDE 0.9 % IV SOLN
Freq: Once | INTRAVENOUS | Status: AC
  Administered 2019-09-15: 11:00:00 via INTRAVENOUS
  Filled 2019-09-15: qty 5

## 2019-09-15 MED ORDER — HEPARIN SOD (PORK) LOCK FLUSH 100 UNIT/ML IV SOLN
500.0000 [IU] | Freq: Once | INTRAVENOUS | Status: AC | PRN
  Administered 2019-09-15: 500 [IU]
  Filled 2019-09-15: qty 5

## 2019-09-15 MED ORDER — PALONOSETRON HCL INJECTION 0.25 MG/5ML
0.2500 mg | Freq: Once | INTRAVENOUS | Status: AC
  Administered 2019-09-15: 11:00:00 0.25 mg via INTRAVENOUS

## 2019-09-15 MED ORDER — SODIUM CHLORIDE 0.9 % IV SOLN
600.0000 mg/m2 | Freq: Once | INTRAVENOUS | Status: AC
  Administered 2019-09-15: 1260 mg via INTRAVENOUS
  Filled 2019-09-15: qty 63

## 2019-09-15 MED ORDER — SODIUM CHLORIDE 0.9% FLUSH
10.0000 mL | INTRAVENOUS | Status: DC | PRN
  Administered 2019-09-15: 15:00:00 10 mL
  Filled 2019-09-15: qty 10

## 2019-09-15 MED ORDER — SODIUM CHLORIDE 0.9 % IV SOLN
Freq: Once | INTRAVENOUS | Status: AC
  Administered 2019-09-15: 10:00:00 via INTRAVENOUS
  Filled 2019-09-15: qty 250

## 2019-09-15 MED ORDER — SODIUM CHLORIDE 0.9% FLUSH
10.0000 mL | INTRAVENOUS | Status: DC | PRN
  Administered 2019-09-15: 10 mL via INTRAVENOUS
  Filled 2019-09-15: qty 10

## 2019-09-15 MED ORDER — DOXORUBICIN HCL CHEMO IV INJECTION 2 MG/ML
60.0000 mg/m2 | Freq: Once | INTRAVENOUS | Status: AC
  Administered 2019-09-15: 126 mg via INTRAVENOUS
  Filled 2019-09-15: qty 63

## 2019-09-15 NOTE — Telephone Encounter (Signed)
Patient will finish treatment on 11/27 with Dr.Feng.   Please schedule her lab MD taxol dignicap on 12/11.  Also please schedule her to have diagnostic mammogram to be done before 12/11 visit.

## 2019-09-15 NOTE — Patient Instructions (Signed)

## 2019-09-15 NOTE — Patient Instructions (Signed)
[facility] Discharge Instructions for Patients Receiving Chemotherapy  **You received Aloxi as a premedication for your treatment today.  Do not take ondansetron (Zofran) for 3 days. The next time you can take zofran will be Monday afternoon.   Today you received the following chemotherapy agents: doxorubicin and cyclophosphamide.  To help prevent nausea and vomiting after your treatment, we encourage you to take your nausea medication as prescribed by your physician.    If you develop nausea and vomiting that is not controlled by your nausea medication, call the clinic.   BELOW ARE SYMPTOMS THAT SHOULD BE REPORTED IMMEDIATELY:  *FEVER GREATER THAN 100.5 F  *CHILLS WITH OR WITHOUT FEVER  NAUSEA AND VOMITING THAT IS NOT CONTROLLED WITH YOUR NAUSEA MEDICATION  *UNUSUAL SHORTNESS OF BREATH  *UNUSUAL BRUISING OR BLEEDING  TENDERNESS IN MOUTH AND THROAT WITH OR WITHOUT PRESENCE OF ULCERS  *URINARY PROBLEMS  *BOWEL PROBLEMS  UNUSUAL RASH Items with * indicate a potential emergency and should be followed up as soon as possible.  Feel free to call the clinic should you have any questions or concerns. The clinic phone number is [phone].  Please show the Branch at check-in to the Emergency Department and triage nurse.  Doxorubicin injection What is this medicine? DOXORUBICIN (dox oh ROO bi sin) is a chemotherapy drug. It is used to treat many kinds of cancer like leukemia, lymphoma, neuroblastoma, sarcoma, and Wilms' tumor. It is also used to treat bladder cancer, breast cancer, lung cancer, ovarian cancer, stomach cancer, and thyroid cancer. This medicine may be used for other purposes; ask your health care provider or pharmacist if you have questions. COMMON BRAND NAME(S): Adriamycin, Adriamycin PFS, Adriamycin RDF, Rubex What should I tell my health care provider before I take this medicine? They need to know if you have any of these  conditions:  heart disease  history of low blood counts caused by a medicine  liver disease  recent or ongoing radiation therapy  an unusual or allergic reaction to doxorubicin, other chemotherapy agents, other medicines, foods, dyes, or preservatives  pregnant or trying to get pregnant  breast-feeding How should I use this medicine? This drug is given as an infusion into a vein. It is administered in a hospital or clinic by a specially trained health care professional. If you have pain, swelling, burning or any unusual feeling around the site of your injection, tell your health care professional right away. Talk to your pediatrician regarding the use of this medicine in children. Special care may be needed. Overdosage: If you think you have taken too much of this medicine contact a poison control center or emergency room at once. NOTE: This medicine is only for you. Do not share this medicine with others. What if I miss a dose? It is important not to miss your dose. Call your doctor or health care professional if you are unable to keep an appointment. What may interact with this medicine? This medicine may interact with the following medications:  6-mercaptopurine  paclitaxel  phenytoin  St. John's Wort  trastuzumab  verapamil This list may not describe all possible interactions. Give your health care provider a list of all the medicines, herbs, non-prescription drugs, or dietary supplements you use. Also tell them if you smoke, drink alcohol, or use illegal drugs. Some items may interact with your medicine. What should I watch for while using this medicine? This drug may make you feel generally unwell. This is not uncommon, as  chemotherapy can affect healthy cells as well as cancer cells. Report any side effects. Continue your course of treatment even though you feel ill unless your doctor tells you to stop. There is a maximum amount of this medicine you should receive  throughout your life. The amount depends on the medical condition being treated and your overall health. Your doctor will watch how much of this medicine you receive in your lifetime. Tell your doctor if you have taken this medicine before. You may need blood work done while you are taking this medicine. Your urine may turn red for a few days after your dose. This is not blood. If your urine is dark or brown, call your doctor. In some cases, you may be given additional medicines to help with side effects. Follow all directions for their use. Call your doctor or health care professional for advice if you get a fever, chills or sore throat, or other symptoms of a cold or flu. Do not treat yourself. This drug decreases your body's ability to fight infections. Try to avoid being around people who are sick. This medicine may increase your risk to bruise or bleed. Call your doctor or health care professional if you notice any unusual bleeding. Talk to your doctor about your risk of cancer. You may be more at risk for certain types of cancers if you take this medicine. Do not become pregnant while taking this medicine or for 6 months after stopping it. Women should inform their doctor if they wish to become pregnant or think they might be pregnant. Men should not father a child while taking this medicine and for 6 months after stopping it. There is a potential for serious side effects to an unborn child. Talk to your health care professional or pharmacist for more information. Do not breast-feed an infant while taking this medicine. This medicine has caused ovarian failure in some women and reduced sperm counts in some men This medicine may interfere with the ability to have a child. Talk with your doctor or health care professional if you are concerned about your fertility. This medicine may cause a decrease in Co-Enzyme Q-10. You should make sure that you get enough Co-Enzyme Q-10 while you are taking this  medicine. Discuss the foods you eat and the vitamins you take with your health care professional. What side effects may I notice from receiving this medicine? Side effects that you should report to your doctor or health care professional as soon as possible:  allergic reactions like skin rash, itching or hives, swelling of the face, lips, or tongue  breathing problems  chest pain  fast or irregular heartbeat  low blood counts - this medicine may decrease the number of white blood cells, red blood cells and platelets. You may be at increased risk for infections and bleeding.  pain, redness, or irritation at site where injected  signs of infection - fever or chills, cough, sore throat, pain or difficulty passing urine  signs of decreased platelets or bleeding - bruising, pinpoint red spots on the skin, black, tarry stools, blood in the urine  swelling of the ankles, feet, hands  tiredness  weakness Side effects that usually do not require medical attention (report to your doctor or health care professional if they continue or are bothersome):  diarrhea  hair loss  mouth sores  nail discoloration or damage  nausea  red colored urine  vomiting This list may not describe all possible side effects. Call your doctor for  medical advice about side effects. You may report side effects to FDA at 1-800-FDA-1088. Where should I keep my medicine? This drug is given in a hospital or clinic and will not be stored at home. NOTE: This sheet is a summary. It may not cover all possible information. If you have questions about this medicine, talk to your doctor, pharmacist, or health care provider.  2020 Elsevier/Gold Standard (2017-06-02 11:01:26)  Cyclophosphamide injection What is this medicine? CYCLOPHOSPHAMIDE (sye kloe FOSS fa mide) is a chemotherapy drug. It slows the growth of cancer cells. This medicine is used to treat many types of cancer like lymphoma, myeloma, leukemia,  breast cancer, and ovarian cancer, to name a few. This medicine may be used for other purposes; ask your health care provider or pharmacist if you have questions. COMMON BRAND NAME(S): Cytoxan, Neosar What should I tell my health care provider before I take this medicine? They need to know if you have any of these conditions:  blood disorders  history of other chemotherapy  infection  kidney disease  liver disease  recent or ongoing radiation therapy  tumors in the bone marrow  an unusual or allergic reaction to cyclophosphamide, other chemotherapy, other medicines, foods, dyes, or preservatives  pregnant or trying to get pregnant  breast-feeding How should I use this medicine? This drug is usually given as an injection into a vein or muscle or by infusion into a vein. It is administered in a hospital or clinic by a specially trained health care professional. Talk to your pediatrician regarding the use of this medicine in children. Special care may be needed. Overdosage: If you think you have taken too much of this medicine contact a poison control center or emergency room at once. NOTE: This medicine is only for you. Do not share this medicine with others. What if I miss a dose? It is important not to miss your dose. Call your doctor or health care professional if you are unable to keep an appointment. What may interact with this medicine? This medicine may interact with the following medications:  amiodarone  amphotericin B  azathioprine  certain antiviral medicines for HIV or AIDS such as protease inhibitors (e.g., indinavir, ritonavir) and zidovudine  certain blood pressure medications such as benazepril, captopril, enalapril, fosinopril, lisinopril, moexipril, monopril, perindopril, quinapril, ramipril, trandolapril  certain cancer medications such as anthracyclines (e.g., daunorubicin, doxorubicin), busulfan, cytarabine, paclitaxel, pentostatin, tamoxifen,  trastuzumab  certain diuretics such as chlorothiazide, chlorthalidone, hydrochlorothiazide, indapamide, metolazone  certain medicines that treat or prevent blood clots like warfarin  certain muscle relaxants such as succinylcholine  cyclosporine  etanercept  indomethacin  medicines to increase blood counts like filgrastim, pegfilgrastim, sargramostim  medicines used as general anesthesia  metronidazole  natalizumab This list may not describe all possible interactions. Give your health care provider a list of all the medicines, herbs, non-prescription drugs, or dietary supplements you use. Also tell them if you smoke, drink alcohol, or use illegal drugs. Some items may interact with your medicine. What should I watch for while using this medicine? Visit your doctor for checks on your progress. This drug may make you feel generally unwell. This is not uncommon, as chemotherapy can affect healthy cells as well as cancer cells. Report any side effects. Continue your course of treatment even though you feel ill unless your doctor tells you to stop. Drink water or other fluids as directed. Urinate often, even at night. In some cases, you may be given additional medicines to help with  side effects. Follow all directions for their use. Call your doctor or health care professional for advice if you get a fever, chills or sore throat, or other symptoms of a cold or flu. Do not treat yourself. This drug decreases your body's ability to fight infections. Try to avoid being around people who are sick. This medicine may increase your risk to bruise or bleed. Call your doctor or health care professional if you notice any unusual bleeding. Be careful brushing and flossing your teeth or using a toothpick because you may get an infection or bleed more easily. If you have any dental work done, tell your dentist you are receiving this medicine. You may get drowsy or dizzy. Do not drive, use machinery, or do  anything that needs mental alertness until you know how this medicine affects you. Do not become pregnant while taking this medicine or for 1 year after stopping it. Women should inform their doctor if they wish to become pregnant or think they might be pregnant. Men should not father a child while taking this medicine and for 4 months after stopping it. There is a potential for serious side effects to an unborn child. Talk to your health care professional or pharmacist for more information. Do not breast-feed an infant while taking this medicine. This medicine may interfere with the ability to have a child. This medicine has caused ovarian failure in some women. This medicine has caused reduced sperm counts in some men. You should talk with your doctor or health care professional if you are concerned about your fertility. If you are going to have surgery, tell your doctor or health care professional that you have taken this medicine. What side effects may I notice from receiving this medicine? Side effects that you should report to your doctor or health care professional as soon as possible:  allergic reactions like skin rash, itching or hives, swelling of the face, lips, or tongue  low blood counts - this medicine may decrease the number of white blood cells, red blood cells and platelets. You may be at increased risk for infections and bleeding.  signs of infection - fever or chills, cough, sore throat, pain or difficulty passing urine  signs of decreased platelets or bleeding - bruising, pinpoint red spots on the skin, black, tarry stools, blood in the urine  signs of decreased red blood cells - unusually weak or tired, fainting spells, lightheadedness  breathing problems  dark urine  dizziness  palpitations  swelling of the ankles, feet, hands  trouble passing urine or change in the amount of urine  weight gain  yellowing of the eyes or skin Side effects that usually do not  require medical attention (report to your doctor or health care professional if they continue or are bothersome):  changes in nail or skin color  hair loss  missed menstrual periods  mouth sores  nausea, vomiting This list may not describe all possible side effects. Call your doctor for medical advice about side effects. You may report side effects to FDA at 1-800-FDA-1088. Where should I keep my medicine? This drug is given in a hospital or clinic and will not be stored at home. NOTE: This sheet is a summary. It may not cover all possible information. If you have questions about this medicine, talk to your doctor, pharmacist, or health care provider.  2020 Elsevier/Gold Standard (2012-09-02 16:22:58)

## 2019-09-16 ENCOUNTER — Inpatient Hospital Stay: Payer: BC Managed Care – PPO

## 2019-09-16 VITALS — BP 126/75 | HR 92 | Temp 98.2°F | Resp 18

## 2019-09-16 DIAGNOSIS — C50311 Malignant neoplasm of lower-inner quadrant of right female breast: Secondary | ICD-10-CM | POA: Diagnosis not present

## 2019-09-16 DIAGNOSIS — Z170 Estrogen receptor positive status [ER+]: Secondary | ICD-10-CM

## 2019-09-16 LAB — CANCER ANTIGEN 27.29: CA 27.29: 102.2 U/mL — ABNORMAL HIGH (ref 0.0–38.6)

## 2019-09-16 MED ORDER — PEGFILGRASTIM-CBQV 6 MG/0.6ML ~~LOC~~ SOSY
6.0000 mg | PREFILLED_SYRINGE | Freq: Once | SUBCUTANEOUS | Status: AC
  Administered 2019-09-16: 6 mg via SUBCUTANEOUS

## 2019-09-16 NOTE — Patient Instructions (Signed)

## 2019-09-18 ENCOUNTER — Encounter: Payer: Self-pay | Admitting: Hematology

## 2019-09-18 ENCOUNTER — Telehealth: Payer: Self-pay | Admitting: Hematology

## 2019-09-18 NOTE — Addendum Note (Signed)
Addended by: Vanice Sarah on: 09/18/2019 03:31 PM   Modules accepted: Orders

## 2019-09-18 NOTE — Telephone Encounter (Signed)
No los per 11/13. °

## 2019-09-18 NOTE — Telephone Encounter (Signed)
Done...  Pt has been sched. on 12/11 for lab/MD/*NEW* Taxol/Dignicap requested.

## 2019-09-18 NOTE — Addendum Note (Signed)
Addended by: Vanice Sarah on: 09/18/2019 12:19 PM   Modules accepted: Orders

## 2019-09-19 ENCOUNTER — Encounter: Payer: Self-pay | Admitting: Hematology

## 2019-09-19 ENCOUNTER — Encounter: Payer: Self-pay | Admitting: Oncology

## 2019-09-19 NOTE — Progress Notes (Signed)
Message sent to schedulers 

## 2019-09-20 ENCOUNTER — Other Ambulatory Visit: Payer: Self-pay | Admitting: Oncology

## 2019-09-21 ENCOUNTER — Telehealth: Payer: Self-pay | Admitting: Hematology

## 2019-09-21 NOTE — Telephone Encounter (Signed)
Scheduled appt per 11/16 sch message - pt is aware of appt date and time

## 2019-09-29 ENCOUNTER — Inpatient Hospital Stay (HOSPITAL_BASED_OUTPATIENT_CLINIC_OR_DEPARTMENT_OTHER): Payer: BC Managed Care – PPO | Admitting: Nurse Practitioner

## 2019-09-29 ENCOUNTER — Encounter: Payer: Self-pay | Admitting: Nurse Practitioner

## 2019-09-29 ENCOUNTER — Inpatient Hospital Stay: Payer: BC Managed Care – PPO

## 2019-09-29 ENCOUNTER — Other Ambulatory Visit: Payer: BC Managed Care – PPO

## 2019-09-29 ENCOUNTER — Other Ambulatory Visit: Payer: Self-pay

## 2019-09-29 ENCOUNTER — Telehealth: Payer: Self-pay | Admitting: Nurse Practitioner

## 2019-09-29 VITALS — BP 123/70 | HR 82

## 2019-09-29 VITALS — BP 108/42 | HR 100 | Temp 98.0°F | Resp 18 | Ht 63.0 in | Wt 217.9 lb

## 2019-09-29 DIAGNOSIS — C50919 Malignant neoplasm of unspecified site of unspecified female breast: Secondary | ICD-10-CM

## 2019-09-29 DIAGNOSIS — Z95828 Presence of other vascular implants and grafts: Secondary | ICD-10-CM

## 2019-09-29 DIAGNOSIS — C50311 Malignant neoplasm of lower-inner quadrant of right female breast: Secondary | ICD-10-CM

## 2019-09-29 DIAGNOSIS — Z170 Estrogen receptor positive status [ER+]: Secondary | ICD-10-CM | POA: Diagnosis not present

## 2019-09-29 LAB — CBC WITH DIFFERENTIAL/PLATELET
Abs Immature Granulocytes: 0.92 10*3/uL — ABNORMAL HIGH (ref 0.00–0.07)
Basophils Absolute: 0.1 10*3/uL (ref 0.0–0.1)
Basophils Relative: 1 %
Eosinophils Absolute: 0.1 10*3/uL (ref 0.0–0.5)
Eosinophils Relative: 1 %
HCT: 33.2 % — ABNORMAL LOW (ref 36.0–46.0)
Hemoglobin: 10.5 g/dL — ABNORMAL LOW (ref 12.0–15.0)
Immature Granulocytes: 10 %
Lymphocytes Relative: 15 %
Lymphs Abs: 1.3 10*3/uL (ref 0.7–4.0)
MCH: 28.5 pg (ref 26.0–34.0)
MCHC: 31.6 g/dL (ref 30.0–36.0)
MCV: 90 fL (ref 80.0–100.0)
Monocytes Absolute: 0.7 10*3/uL (ref 0.1–1.0)
Monocytes Relative: 8 %
Neutro Abs: 5.8 10*3/uL (ref 1.7–7.7)
Neutrophils Relative %: 65 %
Platelets: 226 10*3/uL (ref 150–400)
RBC: 3.69 MIL/uL — ABNORMAL LOW (ref 3.87–5.11)
RDW: 17.2 % — ABNORMAL HIGH (ref 11.5–15.5)
WBC: 8.9 10*3/uL (ref 4.0–10.5)
nRBC: 0.3 % — ABNORMAL HIGH (ref 0.0–0.2)

## 2019-09-29 LAB — COMPREHENSIVE METABOLIC PANEL
ALT: 15 U/L (ref 0–44)
AST: 13 U/L — ABNORMAL LOW (ref 15–41)
Albumin: 3.9 g/dL (ref 3.5–5.0)
Alkaline Phosphatase: 108 U/L (ref 38–126)
Anion gap: 12 (ref 5–15)
BUN: 9 mg/dL (ref 6–20)
CO2: 24 mmol/L (ref 22–32)
Calcium: 9 mg/dL (ref 8.9–10.3)
Chloride: 104 mmol/L (ref 98–111)
Creatinine, Ser: 0.79 mg/dL (ref 0.44–1.00)
GFR calc Af Amer: >60 mL/min (ref >60)
GFR calc non Af Amer: >60 mL/min (ref >60)
Glucose, Bld: 152 mg/dL — ABNORMAL HIGH (ref 70–99)
Potassium: 3.8 mmol/L (ref 3.5–5.1)
Sodium: 140 mmol/L (ref 135–145)
Total Bilirubin: 0.3 mg/dL (ref 0.3–1.2)
Total Protein: 6.9 g/dL (ref 6.5–8.1)

## 2019-09-29 MED ORDER — SODIUM CHLORIDE 0.9 % IV SOLN
Freq: Once | INTRAVENOUS | Status: AC
  Administered 2019-09-29: 10:00:00 via INTRAVENOUS
  Filled 2019-09-29: qty 250

## 2019-09-29 MED ORDER — SODIUM CHLORIDE 0.9 % IV SOLN
Freq: Once | INTRAVENOUS | Status: AC
  Administered 2019-09-29: 11:00:00 via INTRAVENOUS
  Filled 2019-09-29: qty 5

## 2019-09-29 MED ORDER — SODIUM CHLORIDE 0.9% FLUSH
10.0000 mL | INTRAVENOUS | Status: DC | PRN
  Administered 2019-09-29: 10 mL
  Filled 2019-09-29: qty 10

## 2019-09-29 MED ORDER — HEPARIN SOD (PORK) LOCK FLUSH 100 UNIT/ML IV SOLN
500.0000 [IU] | Freq: Once | INTRAVENOUS | Status: AC | PRN
  Administered 2019-09-29: 500 [IU]
  Filled 2019-09-29: qty 5

## 2019-09-29 MED ORDER — SODIUM CHLORIDE 0.9% FLUSH
10.0000 mL | INTRAVENOUS | Status: DC | PRN
  Administered 2019-09-29: 10 mL via INTRAVENOUS
  Filled 2019-09-29: qty 10

## 2019-09-29 MED ORDER — PALONOSETRON HCL INJECTION 0.25 MG/5ML
0.2500 mg | Freq: Once | INTRAVENOUS | Status: AC
  Administered 2019-09-29: 0.25 mg via INTRAVENOUS

## 2019-09-29 MED ORDER — DOXORUBICIN HCL CHEMO IV INJECTION 2 MG/ML
60.0000 mg/m2 | Freq: Once | INTRAVENOUS | Status: AC
  Administered 2019-09-29: 126 mg via INTRAVENOUS
  Filled 2019-09-29: qty 63

## 2019-09-29 MED ORDER — PALONOSETRON HCL INJECTION 0.25 MG/5ML
INTRAVENOUS | Status: AC
  Filled 2019-09-29: qty 5

## 2019-09-29 MED ORDER — SODIUM CHLORIDE 0.9 % IV SOLN
600.0000 mg/m2 | Freq: Once | INTRAVENOUS | Status: AC
  Administered 2019-09-29: 1260 mg via INTRAVENOUS
  Filled 2019-09-29: qty 63

## 2019-09-29 NOTE — Patient Instructions (Signed)
[facility] Discharge Instructions for Patients Receiving Chemotherapy  **You received Aloxi as a premedication for your treatment today.  Do not take ondansetron (Zofran) for 3 days. The next time you can take zofran will be Monday afternoon.   Today you received the following chemotherapy agents: doxorubicin and cyclophosphamide.  To help prevent nausea and vomiting after your treatment, we encourage you to take your nausea medication as prescribed by your physician.    If you develop nausea and vomiting that is not controlled by your nausea medication, call the clinic.   BELOW ARE SYMPTOMS THAT SHOULD BE REPORTED IMMEDIATELY:  *FEVER GREATER THAN 100.5 F  *CHILLS WITH OR WITHOUT FEVER  NAUSEA AND VOMITING THAT IS NOT CONTROLLED WITH YOUR NAUSEA MEDICATION  *UNUSUAL SHORTNESS OF BREATH  *UNUSUAL BRUISING OR BLEEDING  TENDERNESS IN MOUTH AND THROAT WITH OR WITHOUT PRESENCE OF ULCERS  *URINARY PROBLEMS  *BOWEL PROBLEMS  UNUSUAL RASH Items with * indicate a potential emergency and should be followed up as soon as possible.  Feel free to call the clinic should you have any questions or concerns. The clinic phone number is [phone].  Please show the Branch at check-in to the Emergency Department and triage nurse.  Doxorubicin injection What is this medicine? DOXORUBICIN (dox oh ROO bi sin) is a chemotherapy drug. It is used to treat many kinds of cancer like leukemia, lymphoma, neuroblastoma, sarcoma, and Wilms' tumor. It is also used to treat bladder cancer, breast cancer, lung cancer, ovarian cancer, stomach cancer, and thyroid cancer. This medicine may be used for other purposes; ask your health care provider or pharmacist if you have questions. COMMON BRAND NAME(S): Adriamycin, Adriamycin PFS, Adriamycin RDF, Rubex What should I tell my health care provider before I take this medicine? They need to know if you have any of these  conditions:  heart disease  history of low blood counts caused by a medicine  liver disease  recent or ongoing radiation therapy  an unusual or allergic reaction to doxorubicin, other chemotherapy agents, other medicines, foods, dyes, or preservatives  pregnant or trying to get pregnant  breast-feeding How should I use this medicine? This drug is given as an infusion into a vein. It is administered in a hospital or clinic by a specially trained health care professional. If you have pain, swelling, burning or any unusual feeling around the site of your injection, tell your health care professional right away. Talk to your pediatrician regarding the use of this medicine in children. Special care may be needed. Overdosage: If you think you have taken too much of this medicine contact a poison control center or emergency room at once. NOTE: This medicine is only for you. Do not share this medicine with others. What if I miss a dose? It is important not to miss your dose. Call your doctor or health care professional if you are unable to keep an appointment. What may interact with this medicine? This medicine may interact with the following medications:  6-mercaptopurine  paclitaxel  phenytoin  St. John's Wort  trastuzumab  verapamil This list may not describe all possible interactions. Give your health care provider a list of all the medicines, herbs, non-prescription drugs, or dietary supplements you use. Also tell them if you smoke, drink alcohol, or use illegal drugs. Some items may interact with your medicine. What should I watch for while using this medicine? This drug may make you feel generally unwell. This is not uncommon, as  chemotherapy can affect healthy cells as well as cancer cells. Report any side effects. Continue your course of treatment even though you feel ill unless your doctor tells you to stop. There is a maximum amount of this medicine you should receive  throughout your life. The amount depends on the medical condition being treated and your overall health. Your doctor will watch how much of this medicine you receive in your lifetime. Tell your doctor if you have taken this medicine before. You may need blood work done while you are taking this medicine. Your urine may turn red for a few days after your dose. This is not blood. If your urine is dark or brown, call your doctor. In some cases, you may be given additional medicines to help with side effects. Follow all directions for their use. Call your doctor or health care professional for advice if you get a fever, chills or sore throat, or other symptoms of a cold or flu. Do not treat yourself. This drug decreases your body's ability to fight infections. Try to avoid being around people who are sick. This medicine may increase your risk to bruise or bleed. Call your doctor or health care professional if you notice any unusual bleeding. Talk to your doctor about your risk of cancer. You may be more at risk for certain types of cancers if you take this medicine. Do not become pregnant while taking this medicine or for 6 months after stopping it. Women should inform their doctor if they wish to become pregnant or think they might be pregnant. Men should not father a child while taking this medicine and for 6 months after stopping it. There is a potential for serious side effects to an unborn child. Talk to your health care professional or pharmacist for more information. Do not breast-feed an infant while taking this medicine. This medicine has caused ovarian failure in some women and reduced sperm counts in some men This medicine may interfere with the ability to have a child. Talk with your doctor or health care professional if you are concerned about your fertility. This medicine may cause a decrease in Co-Enzyme Q-10. You should make sure that you get enough Co-Enzyme Q-10 while you are taking this  medicine. Discuss the foods you eat and the vitamins you take with your health care professional. What side effects may I notice from receiving this medicine? Side effects that you should report to your doctor or health care professional as soon as possible:  allergic reactions like skin rash, itching or hives, swelling of the face, lips, or tongue  breathing problems  chest pain  fast or irregular heartbeat  low blood counts - this medicine may decrease the number of white blood cells, red blood cells and platelets. You may be at increased risk for infections and bleeding.  pain, redness, or irritation at site where injected  signs of infection - fever or chills, cough, sore throat, pain or difficulty passing urine  signs of decreased platelets or bleeding - bruising, pinpoint red spots on the skin, black, tarry stools, blood in the urine  swelling of the ankles, feet, hands  tiredness  weakness Side effects that usually do not require medical attention (report to your doctor or health care professional if they continue or are bothersome):  diarrhea  hair loss  mouth sores  nail discoloration or damage  nausea  red colored urine  vomiting This list may not describe all possible side effects. Call your doctor for  medical advice about side effects. You may report side effects to FDA at 1-800-FDA-1088. Where should I keep my medicine? This drug is given in a hospital or clinic and will not be stored at home. NOTE: This sheet is a summary. It may not cover all possible information. If you have questions about this medicine, talk to your doctor, pharmacist, or health care provider.  2020 Elsevier/Gold Standard (2017-06-02 11:01:26)  Cyclophosphamide injection What is this medicine? CYCLOPHOSPHAMIDE (sye kloe FOSS fa mide) is a chemotherapy drug. It slows the growth of cancer cells. This medicine is used to treat many types of cancer like lymphoma, myeloma, leukemia,  breast cancer, and ovarian cancer, to name a few. This medicine may be used for other purposes; ask your health care provider or pharmacist if you have questions. COMMON BRAND NAME(S): Cytoxan, Neosar What should I tell my health care provider before I take this medicine? They need to know if you have any of these conditions:  blood disorders  history of other chemotherapy  infection  kidney disease  liver disease  recent or ongoing radiation therapy  tumors in the bone marrow  an unusual or allergic reaction to cyclophosphamide, other chemotherapy, other medicines, foods, dyes, or preservatives  pregnant or trying to get pregnant  breast-feeding How should I use this medicine? This drug is usually given as an injection into a vein or muscle or by infusion into a vein. It is administered in a hospital or clinic by a specially trained health care professional. Talk to your pediatrician regarding the use of this medicine in children. Special care may be needed. Overdosage: If you think you have taken too much of this medicine contact a poison control center or emergency room at once. NOTE: This medicine is only for you. Do not share this medicine with others. What if I miss a dose? It is important not to miss your dose. Call your doctor or health care professional if you are unable to keep an appointment. What may interact with this medicine? This medicine may interact with the following medications:  amiodarone  amphotericin B  azathioprine  certain antiviral medicines for HIV or AIDS such as protease inhibitors (e.g., indinavir, ritonavir) and zidovudine  certain blood pressure medications such as benazepril, captopril, enalapril, fosinopril, lisinopril, moexipril, monopril, perindopril, quinapril, ramipril, trandolapril  certain cancer medications such as anthracyclines (e.g., daunorubicin, doxorubicin), busulfan, cytarabine, paclitaxel, pentostatin, tamoxifen,  trastuzumab  certain diuretics such as chlorothiazide, chlorthalidone, hydrochlorothiazide, indapamide, metolazone  certain medicines that treat or prevent blood clots like warfarin  certain muscle relaxants such as succinylcholine  cyclosporine  etanercept  indomethacin  medicines to increase blood counts like filgrastim, pegfilgrastim, sargramostim  medicines used as general anesthesia  metronidazole  natalizumab This list may not describe all possible interactions. Give your health care provider a list of all the medicines, herbs, non-prescription drugs, or dietary supplements you use. Also tell them if you smoke, drink alcohol, or use illegal drugs. Some items may interact with your medicine. What should I watch for while using this medicine? Visit your doctor for checks on your progress. This drug may make you feel generally unwell. This is not uncommon, as chemotherapy can affect healthy cells as well as cancer cells. Report any side effects. Continue your course of treatment even though you feel ill unless your doctor tells you to stop. Drink water or other fluids as directed. Urinate often, even at night. In some cases, you may be given additional medicines to help with  side effects. Follow all directions for their use. Call your doctor or health care professional for advice if you get a fever, chills or sore throat, or other symptoms of a cold or flu. Do not treat yourself. This drug decreases your body's ability to fight infections. Try to avoid being around people who are sick. This medicine may increase your risk to bruise or bleed. Call your doctor or health care professional if you notice any unusual bleeding. Be careful brushing and flossing your teeth or using a toothpick because you may get an infection or bleed more easily. If you have any dental work done, tell your dentist you are receiving this medicine. You may get drowsy or dizzy. Do not drive, use machinery, or do  anything that needs mental alertness until you know how this medicine affects you. Do not become pregnant while taking this medicine or for 1 year after stopping it. Women should inform their doctor if they wish to become pregnant or think they might be pregnant. Men should not father a child while taking this medicine and for 4 months after stopping it. There is a potential for serious side effects to an unborn child. Talk to your health care professional or pharmacist for more information. Do not breast-feed an infant while taking this medicine. This medicine may interfere with the ability to have a child. This medicine has caused ovarian failure in some women. This medicine has caused reduced sperm counts in some men. You should talk with your doctor or health care professional if you are concerned about your fertility. If you are going to have surgery, tell your doctor or health care professional that you have taken this medicine. What side effects may I notice from receiving this medicine? Side effects that you should report to your doctor or health care professional as soon as possible:  allergic reactions like skin rash, itching or hives, swelling of the face, lips, or tongue  low blood counts - this medicine may decrease the number of white blood cells, red blood cells and platelets. You may be at increased risk for infections and bleeding.  signs of infection - fever or chills, cough, sore throat, pain or difficulty passing urine  signs of decreased platelets or bleeding - bruising, pinpoint red spots on the skin, black, tarry stools, blood in the urine  signs of decreased red blood cells - unusually weak or tired, fainting spells, lightheadedness  breathing problems  dark urine  dizziness  palpitations  swelling of the ankles, feet, hands  trouble passing urine or change in the amount of urine  weight gain  yellowing of the eyes or skin Side effects that usually do not  require medical attention (report to your doctor or health care professional if they continue or are bothersome):  changes in nail or skin color  hair loss  missed menstrual periods  mouth sores  nausea, vomiting This list may not describe all possible side effects. Call your doctor for medical advice about side effects. You may report side effects to FDA at 1-800-FDA-1088. Where should I keep my medicine? This drug is given in a hospital or clinic and will not be stored at home. NOTE: This sheet is a summary. It may not cover all possible information. If you have questions about this medicine, talk to your doctor, pharmacist, or health care provider.  2020 Elsevier/Gold Standard (2012-09-02 16:22:58)

## 2019-09-29 NOTE — Progress Notes (Signed)
  [place] OFFICE PROGRESS NOTE   Diagnosis: Breast cancer  INTERVAL HISTORY:   [name] returns as scheduled.  She completed cycle 3 dose dense Adriamycin/Cytoxan 09/15/2019.  She had some nausea beginning day 2.  The nausea was controlled with Compazine, lasting a total of 3 to 4 days.  No vomiting.  No mouth sores.  Mild diarrhea.  No rash.  She denies fever.  She has a slight occasional cough.  No shortness of breath.  Appetite is "75%".  She thinks the right breast mass is smaller again.  Objective:  Vital signs in last 24 hours:  Blood pressure (!) 108/42, pulse 100, temperature 98 F (36.7 C), temperature source Temporal, resp. rate 18, height '5\' 3"'$  (1.6 m), weight 217 lb 14.4 oz (98.8 kg), SpO2 98 %.    HEENT: No thrush or ulcers. Lymphatics: Approximate 1.5 cm right axillary lymph node. GI: Abdomen soft and nontender.  No hepatomegaly. Vascular: No leg edema. Neuro: Alert and oriented. Skin: No rash. Breast: Right breast mass lower inner quadrant measuring approximately 3 x 1.5 cm. Port-A-Cath without erythema.   Lab Results:  Lab Results  Component Value Date   WBC 8.9 09/29/2019   HGB 10.5 (L) 09/29/2019   HCT 33.2 (L) 09/29/2019   MCV 90.0 09/29/2019   PLT 226 09/29/2019   NEUTROABS PENDING 09/29/2019    Imaging:  No results found.  Medications: I have reviewed the patient's current medications.  Assessment/Plan: 1. Invasive right breast cancer, stage IIb,c(T2N1M0), ER/PR positive, HER-2 negative, grade 3 status post cycle 3 of 4 dose dense Adriamycin/Cytoxan 09/15/2019. 2. Iron deficiency anemia  Disposition: Jessica [name] appears stable.  She has completed 3 cycles of dose dense Adriamycin/Cytoxan.  Overall she is tolerating chemotherapy well.  Plan to proceed with cycle 4 today as scheduled.  She will return for [facility] 09/30/2019.  We reviewed the CBC from today.  Counts adequate to proceed with treatment.  We did not  schedule further follow-up in our office.  Her care will be transitioned back to Dr. Tasia Catchings at the [place] center.   Ned Card ANP/GNP-BC   09/29/2019  9:22 AM

## 2019-09-29 NOTE — Telephone Encounter (Signed)
Scheduled per los. Gave avs and calendar  

## 2019-09-30 ENCOUNTER — Inpatient Hospital Stay: Payer: BC Managed Care – PPO

## 2019-09-30 VITALS — BP 132/75 | HR 76 | Temp 98.3°F | Resp 16

## 2019-09-30 DIAGNOSIS — C50311 Malignant neoplasm of lower-inner quadrant of right female breast: Secondary | ICD-10-CM

## 2019-09-30 MED ORDER — PEGFILGRASTIM-CBQV 6 MG/0.6ML ~~LOC~~ SOSY
6.0000 mg | PREFILLED_SYRINGE | Freq: Once | SUBCUTANEOUS | Status: AC
  Administered 2019-09-30: 6 mg via SUBCUTANEOUS

## 2019-09-30 MED ORDER — PEGFILGRASTIM-CBQV 6 MG/0.6ML ~~LOC~~ SOSY
PREFILLED_SYRINGE | SUBCUTANEOUS | Status: AC
  Filled 2019-09-30: qty 0.6

## 2019-09-30 NOTE — Patient Instructions (Signed)

## 2019-10-02 ENCOUNTER — Ambulatory Visit
Admission: RE | Admit: 2019-10-02 | Discharge: 2019-10-02 | Disposition: A | Discharge status: Home / Self Care | Payer: BC Managed Care – PPO | Source: Ambulatory Visit | Attending: Oncology | Admitting: Oncology

## 2019-10-02 DIAGNOSIS — Z170 Estrogen receptor positive status [ER+]: Secondary | ICD-10-CM | POA: Diagnosis present

## 2019-10-02 DIAGNOSIS — C50311 Malignant neoplasm of lower-inner quadrant of right female breast: Secondary | ICD-10-CM | POA: Diagnosis not present

## 2019-10-02 HISTORY — DX: Personal history of antineoplastic chemotherapy: Z92.21

## 2019-10-03 ENCOUNTER — Ambulatory Visit: Payer: BC Managed Care – PPO | Admitting: Oncology

## 2019-10-03 ENCOUNTER — Other Ambulatory Visit: Payer: BC Managed Care – PPO

## 2019-10-03 ENCOUNTER — Ambulatory Visit: Payer: BC Managed Care – PPO

## 2019-10-05 ENCOUNTER — Encounter: Payer: Self-pay | Admitting: *Deleted

## 2019-10-05 ENCOUNTER — Other Ambulatory Visit: Payer: Self-pay | Admitting: *Deleted

## 2019-10-05 MED ORDER — OMEPRAZOLE 20 MG PO CPDR: DELAYED_RELEASE_CAPSULE | ORAL | 0 refills | Status: DC

## 2019-10-10 ENCOUNTER — Encounter: Payer: Self-pay | Admitting: Oncology

## 2019-10-11 ENCOUNTER — Encounter: Payer: Self-pay | Admitting: *Deleted

## 2019-10-11 NOTE — Progress Notes (Signed)
Received Mychart message and phone call from patient today.  I have completed the requested paperwork for the [facility].  Patient is to pick it up today at the front desk at the cancer center.

## 2019-10-13 ENCOUNTER — Inpatient Hospital Stay: Payer: BC Managed Care – PPO | Attending: Oncology

## 2019-10-13 ENCOUNTER — Inpatient Hospital Stay (HOSPITAL_BASED_OUTPATIENT_CLINIC_OR_DEPARTMENT_OTHER): Payer: BC Managed Care – PPO | Admitting: Oncology

## 2019-10-13 ENCOUNTER — Encounter: Payer: Self-pay | Admitting: Oncology

## 2019-10-13 ENCOUNTER — Other Ambulatory Visit: Payer: Self-pay

## 2019-10-13 ENCOUNTER — Inpatient Hospital Stay: Payer: BC Managed Care – PPO

## 2019-10-13 VITALS — BP 125/85 | HR 93 | Temp 98.1°F | Resp 16 | Wt 217.9 lb

## 2019-10-13 VITALS — BP 116/81 | HR 74 | Resp 20

## 2019-10-13 DIAGNOSIS — Z170 Estrogen receptor positive status [ER+]: Secondary | ICD-10-CM | POA: Diagnosis not present

## 2019-10-13 DIAGNOSIS — E876 Hypokalemia: Secondary | ICD-10-CM | POA: Insufficient documentation

## 2019-10-13 DIAGNOSIS — Z95828 Presence of other vascular implants and grafts: Secondary | ICD-10-CM

## 2019-10-13 DIAGNOSIS — R5382 Chronic fatigue, unspecified: Secondary | ICD-10-CM | POA: Insufficient documentation

## 2019-10-13 DIAGNOSIS — C50919 Malignant neoplasm of unspecified site of unspecified female breast: Secondary | ICD-10-CM

## 2019-10-13 DIAGNOSIS — Z79899 Other long term (current) drug therapy: Secondary | ICD-10-CM | POA: Diagnosis not present

## 2019-10-13 DIAGNOSIS — T451X5A Adverse effect of antineoplastic and immunosuppressive drugs, initial encounter: Secondary | ICD-10-CM | POA: Insufficient documentation

## 2019-10-13 DIAGNOSIS — Z5111 Encounter for antineoplastic chemotherapy: Secondary | ICD-10-CM

## 2019-10-13 DIAGNOSIS — C50311 Malignant neoplasm of lower-inner quadrant of right female breast: Secondary | ICD-10-CM | POA: Diagnosis not present

## 2019-10-13 DIAGNOSIS — Z7982 Long term (current) use of aspirin: Secondary | ICD-10-CM | POA: Diagnosis not present

## 2019-10-13 DIAGNOSIS — D6481 Anemia due to antineoplastic chemotherapy: Secondary | ICD-10-CM | POA: Diagnosis not present

## 2019-10-13 DIAGNOSIS — Z9221 Personal history of antineoplastic chemotherapy: Secondary | ICD-10-CM | POA: Diagnosis not present

## 2019-10-13 DIAGNOSIS — M5430 Sciatica, unspecified side: Secondary | ICD-10-CM | POA: Insufficient documentation

## 2019-10-13 DIAGNOSIS — D649 Anemia, unspecified: Secondary | ICD-10-CM

## 2019-10-13 DIAGNOSIS — D509 Iron deficiency anemia, unspecified: Secondary | ICD-10-CM | POA: Diagnosis not present

## 2019-10-13 DIAGNOSIS — E611 Iron deficiency: Secondary | ICD-10-CM

## 2019-10-13 LAB — CBC WITH DIFFERENTIAL/PLATELET
Abs Immature Granulocytes: 1.37 10*3/uL — ABNORMAL HIGH (ref 0.00–0.07)
Basophils Absolute: 0.1 10*3/uL (ref 0.0–0.1)
Basophils Relative: 1 %
Eosinophils Absolute: 0.1 10*3/uL (ref 0.0–0.5)
Eosinophils Relative: 1 %
HCT: 33 % — ABNORMAL LOW (ref 36.0–46.0)
Hemoglobin: 10.3 g/dL — ABNORMAL LOW (ref 12.0–15.0)
Immature Granulocytes: 11 %
Lymphocytes Relative: 8 %
Lymphs Abs: 1 10*3/uL (ref 0.7–4.0)
MCH: 28.3 pg (ref 26.0–34.0)
MCHC: 31.2 g/dL (ref 30.0–36.0)
MCV: 90.7 fL (ref 80.0–100.0)
Monocytes Absolute: 1.1 10*3/uL — ABNORMAL HIGH (ref 0.1–1.0)
Monocytes Relative: 9 %
Neutro Abs: 9.1 10*3/uL — ABNORMAL HIGH (ref 1.7–7.7)
Neutrophils Relative %: 70 %
Platelets: 231 10*3/uL (ref 150–400)
RBC: 3.64 MIL/uL — ABNORMAL LOW (ref 3.87–5.11)
RDW: 17.3 % — ABNORMAL HIGH (ref 11.5–15.5)
WBC: 12.8 10*3/uL — ABNORMAL HIGH (ref 4.0–10.5)
nRBC: 0.3 % — ABNORMAL HIGH (ref 0.0–0.2)

## 2019-10-13 LAB — COMPREHENSIVE METABOLIC PANEL
ALT: 14 U/L (ref 0–44)
AST: 18 U/L (ref 15–41)
Albumin: 4 g/dL (ref 3.5–5.0)
Alkaline Phosphatase: 103 U/L (ref 38–126)
Anion gap: 11 (ref 5–15)
BUN: 8 mg/dL (ref 6–20)
CO2: 24 mmol/L (ref 22–32)
Calcium: 8.9 mg/dL (ref 8.9–10.3)
Chloride: 104 mmol/L (ref 98–111)
Creatinine, Ser: 0.68 mg/dL (ref 0.44–1.00)
GFR calc Af Amer: >60 mL/min (ref >60)
GFR calc non Af Amer: >60 mL/min (ref >60)
Glucose, Bld: 143 mg/dL — ABNORMAL HIGH (ref 70–99)
Potassium: 3.7 mmol/L (ref 3.5–5.1)
Sodium: 139 mmol/L (ref 135–145)
Total Bilirubin: 0.4 mg/dL (ref 0.3–1.2)
Total Protein: 7 g/dL (ref 6.5–8.1)

## 2019-10-13 MED ORDER — FAMOTIDINE IN NACL 20-0.9 MG/50ML-% IV SOLN
20.0000 mg | Freq: Once | INTRAVENOUS | Status: AC
  Administered 2019-10-13: 20 mg via INTRAVENOUS
  Filled 2019-10-13: qty 50

## 2019-10-13 MED ORDER — SODIUM CHLORIDE 0.9 % IV SOLN
80.0000 mg/m2 | Freq: Once | INTRAVENOUS | Status: AC
  Administered 2019-10-13: 168 mg via INTRAVENOUS
  Filled 2019-10-13: qty 28

## 2019-10-13 MED ORDER — SODIUM CHLORIDE 0.9 % IV SOLN
Freq: Once | INTRAVENOUS | Status: AC
  Administered 2019-10-13: 09:00:00 via INTRAVENOUS
  Filled 2019-10-13: qty 250

## 2019-10-13 MED ORDER — HEPARIN SOD (PORK) LOCK FLUSH 100 UNIT/ML IV SOLN
500.0000 [IU] | Freq: Once | INTRAVENOUS | Status: AC | PRN
  Administered 2019-10-13: 500 [IU]
  Filled 2019-10-13: qty 5

## 2019-10-13 MED ORDER — SUCRALFATE 1 G PO TABS: 1.0000 g | ORAL_TABLET | Freq: Two times a day (BID) | ORAL | 0 refills | Status: DC

## 2019-10-13 MED ORDER — SODIUM CHLORIDE 0.9% FLUSH
10.0000 mL | Freq: Once | INTRAVENOUS | Status: AC
  Administered 2019-10-13: 10 mL via INTRAVENOUS
  Filled 2019-10-13: qty 10

## 2019-10-13 MED ORDER — SODIUM CHLORIDE 0.9 % IV SOLN
20.0000 mg | Freq: Once | INTRAVENOUS | Status: AC
  Administered 2019-10-13: 20 mg via INTRAVENOUS
  Filled 2019-10-13: qty 20

## 2019-10-13 MED ORDER — DIPHENHYDRAMINE HCL 50 MG/ML IJ SOLN
50.0000 mg | Freq: Once | INTRAMUSCULAR | Status: AC
  Administered 2019-10-13: 50 mg via INTRAVENOUS
  Filled 2019-10-13: qty 1

## 2019-10-13 NOTE — Progress Notes (Signed)
[place]   [phone]) [phone] [phone]   Clinic Follow up Note   Patient Care Team: Ricardo Jericho, NP as PCP - General (Family Medicine) Rico Junker, RN as Registered Nurse  Date of Service:  10/13/2019  CHIEF COMPLAINT: F/u of right breast cancer   SUMMARY OF ONCOLOGIC HISTORY: Oncology History Overview Note  Cancer Staging Malignant neoplasm of lower-inner quadrant of right breast of female, estrogen receptor positive ([place]) Staging form: Breast, AJCC 8th Edition - Clinical stage from 07/26/2019: Stage IIB (cT2, cN1, cM0, G3, ER+, PR+, HER2-) - Unsigned    Malignant neoplasm of lower-inner quadrant of right breast of female, estrogen receptor positive ([place])  07/20/2019 Mammogram   Mammogram 07/20/19  IMPRESSION: Suspicious palpable right breast mass 3.4 x 2.5 x 3.4 cm 5 o'clock position 3 cm from nipple.   Suspicious 1.6cm cortically thickened right axillary lymph node.   07/26/2019 Initial Diagnosis   DIAGNOSIS: 07/26/19 A. RIGHT BREAST, 5:00, 3CMFN; ULTRASOUND-GUIDED NEEDLE CORE BIOPSY:  - INVASIVE MAMMARY CARCINOMA, NO SPECIAL TYPE.    07/26/2019 Receptors her2   BREAST BIOMARKER TESTS  Estrogen Receptor (ER) Status: POSITIVE                       Percentage of cells with nuclear positivity: 51-90%                       Average intensity of staining: Strong   Progesterone Receptor (PgR) Status: POSITIVE                       Percentage of cells with nuclear positivity:  Greater than 90%                       Average intensity of staining: Strong   HER2 (by immunohistochemistry): NEGATIVE (Score 1+)    08/05/2019 Initial Diagnosis   Invasive carcinoma of breast ([place])   08/17/2019 Surgery   INSERTION PORT-A-CATH by Dr. Lysle Pearl 08/17/19    08/18/2019 -  Chemotherapy   AC q2weeks for 4 cycles starting 08/18/19 followed by weekly Taxol for 12 weeks       CURRENT THERAPY:  Neoadjuvant chemo ddAC starting 08/18/19 for 4  cycles followed by weekly Taxol   INTERVAL HISTORY:  Jessica [name] presents to follow-up for evaluation prior to chemotherapy for breast cancer treatment. Patient has completed 4 cycles of dose dense Adriamycin and Cytoxan with scalp cooling system with Dr.Feng at [facility]. Patient reports feeling tired for a few days after each cycle of chemotherapy.  Mild bone pain from Udenyca injection.  She also reports some loose bowel movement for a few days after each chemo. Overall tolerated with mild difficulties. She feels that the scalp cooling system is helpful.  Tolerates well. Patient has had interim mammogram and ultrasound done she also has noticed decreased size of right breast mass.  Review of Systems  Constitutional: Positive for fatigue. Negative for appetite change, chills and fever.  HENT:   Negative for hearing loss and voice change.   Eyes: Negative for eye problems.  Respiratory: Negative for chest tightness and cough.   Cardiovascular: Negative for chest pain.  Gastrointestinal: Positive for blood in stool. Negative for abdominal distention and abdominal pain.  Endocrine: Negative for hot flashes.  Genitourinary: Negative for difficulty urinating and frequency.   Musculoskeletal: Negative for arthralgias.  Skin: Negative for itching and rash.  Neurological: Negative  for extremity weakness.  Hematological: Negative for adenopathy.  Psychiatric/Behavioral: Negative for confusion.    MEDICAL HISTORY:  Past Medical History:  Diagnosis Date  . Breast cancer ([place])    right breast  . Cancer ([place])    breast cancer  . Frequency   . Kidney stones   . Obesity   . Personal history of chemotherapy   . Right flank pain     SURGICAL HISTORY: Past Surgical History:  Procedure Laterality Date  . BREAST BIOPSY    . CESAREAN SECTION     x 2  . CHOLECYSTECTOMY    . DILATION AND CURETTAGE OF UTERUS    . PORTACATH PLACEMENT Left 08/17/2019   Procedure: INSERTION  PORT-A-CATH;  Surgeon: Benjamine Sprague, DO;  Location: [facility] ORS;  Service: General;  Laterality: Left;  . TUBAL LIGATION      I have reviewed the social history and family history with the patient and they are unchanged from previous note.  ALLERGIES:  has No Known Allergies.  MEDICATIONS:  Current Outpatient Medications  Medication Sig Dispense Refill  . ferrous sulfate 325 (65 FE) MG EC tablet TAKE 1 TABLET (325 MG TOTAL) BY MOUTH 2 (TWO) TIMES DAILY WITH A MEAL. 60 tablet 1  . gabapentin (NEURONTIN) 600 MG tablet Take 600 mg by mouth 3 (three) times daily.    Marland Kitchen lidocaine-prilocaine (EMLA) cream Apply to affected area once 30 g 3  . omeprazole (PRILOSEC) 20 MG capsule TAKE 1 CAPSULE BY MOUTH EVERY DAY (Patient taking differently: daily as needed. TAKE 1 CAPSULE BY MOUTH EVERY DAY) 90 capsule 0  . prochlorperazine (COMPAZINE) 10 MG tablet Take 1 tablet (10 mg total) by mouth every 6 (six) hours as needed (Nausea or vomiting). 30 tablet 1  . sucralfate (CARAFATE) 1 g tablet Take 1 tablet (1 g total) by mouth 2 (two) times daily. 60 tablet 0   No current facility-administered medications for this visit.   Facility-Administered Medications Ordered in Other Visits  Medication Dose Route Frequency Provider Last Rate Last Admin  . dexamethasone (DECADRON) 20 mg in sodium chloride 0.9 % 50 mL IVPB  20 mg Intravenous Once Earlie Server, MD      . famotidine (PEPCID) IVPB 20 mg premix  20 mg Intravenous Once Earlie Server, MD      . heparin lock flush 100 unit/mL  500 Units Intracatheter Once PRN Earlie Server, MD      . PACLitaxel (TAXOL) 168 mg in sodium chloride 0.9 % 250 mL chemo infusion (</= 71m/m2)  80 mg/m2 (Treatment Plan Recorded) Intravenous Once YEarlie Server MD        PHYSICAL EXAMINATION: ECOG PERFORMANCE STATUS: 0 - Asymptomatic  Vitals:   10/13/19 0845  BP: 125/85  Pulse: 93  Resp: 16  Temp: 98.1 F (36.7 C)   Filed Weights   10/13/19 0845  Weight: 217 lb 14.4 oz (98.8 kg)   Physical  Exam  Constitutional: She is oriented to person, place, and time. No distress.  HENT:  Head: Normocephalic and atraumatic.  Nose: Nose normal.  Mouth/Throat: Oropharynx is clear and moist. No oropharyngeal exudate.  Eyes: Pupils are equal, round, and reactive to light. EOM are normal. No scleral icterus.  Cardiovascular: Normal rate and regular rhythm.  No murmur heard. Pulmonary/Chest: Effort normal. No respiratory distress. She has no rales. She exhibits no tenderness.  Abdominal: Soft. She exhibits no distension. There is no abdominal tenderness.  Musculoskeletal:        General: No edema. Normal  range of motion.     Cervical back: Normal range of motion and neck supple.  Neurological: She is alert and oriented to person, place, and time. No cranial nerve deficit. She exhibits normal muscle tone. Coordination normal.  Skin: Skin is warm and dry. She is not diaphoretic. No erythema.  No alopecia  Psychiatric: Affect normal.    Breasts: Breast inspection showed them to be symmetrical with no nipple discharge. Palpable lower-inner quadrant of right breast, decreased in size.    LABORATORY DATA:  I have reviewed the data as listed CBC Latest Ref Rng & Units 10/13/2019 09/29/2019 09/15/2019  WBC 4.0 - 10.5 K/uL 12.8(H) 8.9 13.2(H)  Hemoglobin 12.0 - 15.0 g/dL 10.3(L) 10.5(L) 11.1(L)  Hematocrit 36.0 - 46.0 % 33.0(L) 33.2(L) 35.2(L)  Platelets 150 - 400 K/uL 231 226 276     CMP Latest Ref Rng & Units 10/13/2019 09/29/2019 09/15/2019  Glucose 70 - 99 mg/dL 143(H) 152(H) 89  BUN 6 - 20 mg/dL '8 9 11  ' Creatinine 0.44 - 1.00 mg/dL 0.68 0.79 0.74  Sodium 135 - 145 mmol/L 139 140 141  Potassium 3.5 - 5.1 mmol/L 3.7 3.8 3.9  Chloride 98 - 111 mmol/L 104 104 105  CO2 22 - 32 mmol/L '24 24 27  ' Calcium 8.9 - 10.3 mg/dL 8.9 9.0 9.2  Total Protein 6.5 - 8.1 g/dL 7.0 6.9 7.2  Total Bilirubin 0.3 - 1.2 mg/dL 0.4 0.3 0.2(L)  Alkaline Phos 38 - 126 U/L 103 108 110  AST 15 - 41 U/L 18 13(L)  10(L)  ALT 0 - 44 U/L '14 15 12      ' RADIOGRAPHIC STUDIES: I have personally reviewed the radiological images as listed and agreed with the findings in the report. DG Chest 1 View  Result Date: 08/17/2019 CLINICAL DATA:  Post port placement, breast cancer EXAM: CHEST  1 VIEW COMPARISON:  01/20/2010 FINDINGS: Interval placement of LEFT-sided power port, tip overlying the level of superior vena cava. Shallow lung inflation. Heart size is normal. No pulmonary edema. No pneumothorax following the procedure. IMPRESSION: Interval placement of LEFT-sided power port. No adverse features. Electronically Signed   By: Nolon Nations M.D.   On: 08/17/2019 09:17   DG Abd 1 View  Result Date: 07/25/2019 CLINICAL DATA:  Flank pain EXAM: ABDOMEN - 1 VIEW COMPARISON:  None. FINDINGS: The bowel gas pattern is normal. Surgical clips seen in the right upper quadrant. Moderate amount of colonic stool. No radio-opaque calculi or other significant radiographic abnormality are seen. IMPRESSION: No calculi seen overlying the renal shadows. Electronically Signed   By: Prudencio Pair M.D.   On: 07/25/2019 14:16   NM Cardiac Muga Rest  Result Date: 08/09/2019 CLINICAL DATA:  Invasive breast cancer, pre cardiotoxic chemotherapy EXAM: NUCLEAR MEDICINE CARDIAC BLOOD POOL IMAGING (MUGA) TECHNIQUE: Cardiac multi-gated acquisition was performed at rest following intravenous injection of Tc-93mlabeled red blood cells. RADIOPHARMACEUTICALS:  19.95 mCi Tc-988mertechnetate in-vitro labeled red blood cells IV COMPARISON:  None FINDINGS: Calculated LEFT ventricular ejection fraction is 59.5%, normal. Study was obtained at a cardiac rate of 79 bpm. Patient was rhythmic during imaging. Cine analysis of the LEFT ventricle in 3 projections demonstrates normal LV wall motion. IMPRESSION: Normal LEFT ventricular ejection fraction of 59.5%. Normal LV wall motion. Electronically Signed   By: MaLavonia Dana.D.   On: 08/09/2019 16:18   DG C-Arm  1-60 Min-No Report  Result Date: 08/17/2019 Fluoroscopy was utilized by the requesting physician.  No radiographic interpretation.  US Breast Limited Uni Right Inc Axilla  Result Date: 10/02/2019 CLINICAL DATA:  43 year old female with biopsy proven invasive mammary carcinoma of the right breast and metastatic axillary adenopathy. Assess response to neoadjuvant chemotherapy. EXAM: DIGITAL DIAGNOSTIC RIGHT MAMMOGRAM WITH CAD AND TOMO ULTRASOUND RIGHT BREAST COMPARISON:  Previous exam(s). ACR Breast Density Category b: There are scattered areas of fibroglandular density. FINDINGS: There is a 2.1 cm mass in the lower inner quadrant of the right breast. There is a heart shaped clip located in the mass. There is an enlarged right axillary lymph node with a HydroMARK clip. No additional mass is seen in the right breast. There are no malignant type microcalcifications. Mammographic images were processed with CAD. Targeted ultrasound is performed, showing there is an irregular hypoechoic mass in the right breast at 5 o'clock 3 cm from the nipple measuring 2.8 x 1.6 x 2.8 cm. There is a solitary enlarged axillary lymph node measuring 3.3 x 1.5 cm (cortical thickening 1.5 cm). On the prior ultrasound dated 07/20/2019 the mass in the right breast at 5 o'clock 3 cm from the nipple measured 3.4 x 2.4 x 3.4 cm and the lymph node cortical thickening measured 1.6 cm. IMPRESSION: Interval reduction in the size of the mass in the 5 o'clock region of the right breast now measuring 2.8 cm versus 3.3 cm on the prior exam. The axillary lymph node is relatively unchanged. RECOMMENDATION: Continued treatment planning of the known right breast invasive mammary carcinoma and metastatic axillary adenopathy is recommended. I have discussed the findings and recommendations with the patient. If applicable, a reminder letter will be sent to the patient regarding the next appointment. BI-RADS CATEGORY  6: Known biopsy-proven malignancy.  Electronically Signed   By: Lillia Mountain M.D.   On: 10/02/2019 15:29   US BREAST LTD UNI RIGHT INC AXILLA  Result Date: 07/20/2019 CLINICAL DATA:  Patient presents for palpable abnormality within the right breast. EXAM: DIGITAL DIAGNOSTIC BILATERAL MAMMOGRAM WITH CAD AND TOMO ULTRASOUND RIGHT BREAST COMPARISON:  Previous exam(s). ACR Breast Density Category c: The breast tissue is heterogeneously dense, which may obscure small masses. FINDINGS: There is a lobular mass within the lower inner right breast middle depth, further evaluated with spot tangential view at the site of palpable concern. No additional concerning masses, calcifications or distortion identified within either breast. Mammographic images were processed with CAD. Targeted ultrasound is performed, showing a 3.4 x 2.5 x 3.4 cm hypoechoic irregular mass right breast 5 o'clock position 3 cm from nipple at the site of palpable concern. There is a single cortically thickened abnormal appearing right axillary node with a cortical thickness of 1.6 cm. IMPRESSION: Suspicious palpable right breast mass. Suspicious cortically thickened right axillary lymph node. RECOMMENDATION: Ultrasound-guided core needle biopsy of the right breast mass 5 o'clock position. Ultrasound-guided core needle biopsy of the cortically thickened right axillary lymph node. I have discussed the findings and recommendations with the patient. If applicable, a reminder letter will be sent to the patient regarding the next appointment. BI-RADS CATEGORY  5: Highly suggestive of malignancy. Electronically Signed   By: Lovey Newcomer M.D.   On: 07/20/2019 15:47   MM DIAG BREAST TOMO UNI RIGHT  Result Date: 10/02/2019 CLINICAL DATA:  43 year old female with biopsy proven invasive mammary carcinoma of the right breast and metastatic axillary adenopathy. Assess response to neoadjuvant chemotherapy. EXAM: DIGITAL DIAGNOSTIC RIGHT MAMMOGRAM WITH CAD AND TOMO ULTRASOUND RIGHT BREAST  COMPARISON:  Previous exam(s). ACR Breast Density Category b: There are scattered  areas of fibroglandular density. FINDINGS: There is a 2.1 cm mass in the lower inner quadrant of the right breast. There is a heart shaped clip located in the mass. There is an enlarged right axillary lymph node with a HydroMARK clip. No additional mass is seen in the right breast. There are no malignant type microcalcifications. Mammographic images were processed with CAD. Targeted ultrasound is performed, showing there is an irregular hypoechoic mass in the right breast at 5 o'clock 3 cm from the nipple measuring 2.8 x 1.6 x 2.8 cm. There is a solitary enlarged axillary lymph node measuring 3.3 x 1.5 cm (cortical thickening 1.5 cm). On the prior ultrasound dated 07/20/2019 the mass in the right breast at 5 o'clock 3 cm from the nipple measured 3.4 x 2.4 x 3.4 cm and the lymph node cortical thickening measured 1.6 cm. IMPRESSION: Interval reduction in the size of the mass in the 5 o'clock region of the right breast now measuring 2.8 cm versus 3.3 cm on the prior exam. The axillary lymph node is relatively unchanged. RECOMMENDATION: Continued treatment planning of the known right breast invasive mammary carcinoma and metastatic axillary adenopathy is recommended. I have discussed the findings and recommendations with the patient. If applicable, a reminder letter will be sent to the patient regarding the next appointment. BI-RADS CATEGORY  6: Known biopsy-proven malignancy. Electronically Signed   By: Lillia Mountain M.D.   On: 10/02/2019 15:29   MM DIAG BREAST TOMO BILATERAL  Result Date: 07/20/2019 CLINICAL DATA:  Patient presents for palpable abnormality within the right breast. EXAM: DIGITAL DIAGNOSTIC BILATERAL MAMMOGRAM WITH CAD AND TOMO ULTRASOUND RIGHT BREAST COMPARISON:  Previous exam(s). ACR Breast Density Category c: The breast tissue is heterogeneously dense, which may obscure small masses. FINDINGS: There is a lobular mass  within the lower inner right breast middle depth, further evaluated with spot tangential view at the site of palpable concern. No additional concerning masses, calcifications or distortion identified within either breast. Mammographic images were processed with CAD. Targeted ultrasound is performed, showing a 3.4 x 2.5 x 3.4 cm hypoechoic irregular mass right breast 5 o'clock position 3 cm from nipple at the site of palpable concern. There is a single cortically thickened abnormal appearing right axillary node with a cortical thickness of 1.6 cm. IMPRESSION: Suspicious palpable right breast mass. Suspicious cortically thickened right axillary lymph node. RECOMMENDATION: Ultrasound-guided core needle biopsy of the right breast mass 5 o'clock position. Ultrasound-guided core needle biopsy of the cortically thickened right axillary lymph node. I have discussed the findings and recommendations with the patient. If applicable, a reminder letter will be sent to the patient regarding the next appointment. BI-RADS CATEGORY  5: Highly suggestive of malignancy. Electronically Signed   By: Lovey Newcomer M.D.   On: 07/20/2019 15:47   MM CLIP PLACEMENT RIGHT  Result Date: 07/26/2019 CLINICAL DATA:  Post biopsy mammogram of the right breast for clip placement. EXAM: DIAGNOSTIC RIGHT MAMMOGRAM POST ULTRASOUND BIOPSY COMPARISON:  Previous exam(s). FINDINGS: Mammographic images were obtained following ultrasound guided biopsy of a mass in the right breast and a right axillary lymph node. The heart shaped biopsy marking clip is well positioned within the mass in the lower-inner right breast. The spiral shaped HydroMARK clip is well positioned in the right axillary lymph node. IMPRESSION: 1. Appropriate positioning of the heart shaped biopsy marking clip in the mass in the lower-inner right breast. 2. Appropriate positioning of the spiral shaped HydroMARK clip in the right axillary lymph node. Final  Assessment: Post Procedure  Mammograms for Marker Placement Electronically Signed   By: Ammie Ferrier M.D.   On: 07/26/2019 09:13   [place] RT BREAST BX W LOC DEV 1ST LESION IMG BX SPEC US GUIDE  Addendum Date: 07/31/2019   ADDENDUM REPORT: 07/31/2019 11:24 ADDENDUM: Surgical consultation was arranged for patient to see DR. Benjamine Sprague on 07/31/2019. Addendum by Electa Sniff RN on 07/31/2019. Electronically Signed   By: Ammie Ferrier M.D.   On: 07/31/2019 11:24   Addendum Date: 07/28/2019   ADDENDUM REPORT: 07/28/2019 13:08 ADDENDUM: PATHOLOGY revealed: A. RIGHT BREAST, 5:00, 3CMFN; ULTRASOUND-GUIDED NEEDLE CORE BIOPSY: - INVASIVE MAMMARY CARCINOMA, NO SPECIAL TYPE. 11 mm in this sample. Grade 3. Ductal carcinoma in situ: Not identified. Lymphovascular invasion: Not identified. Comment: The definitive grade will be assigned on the excisional specimen. B. LYMPH NODE, RIGHT AXILLARY; ULTRASOUND-GUIDED NEEDLE CORE BIOPSY: - POSITIVE FOR METASTATIC CARCINOMA, 10 MM IN THIS SAMPLE. Pathology results are CONCORDANT with imaging findings, per Dr. Ammie Ferrier. Pathology results were discussed with patient via telephone. The patient reported doing well after the biopsy with tenderness at the site. Post biopsy care instructions were reviewed and questions were answered. The patient was encouraged to call [facility] for any additional concerns. Recommendation: Surgical referral. Request for surgical referral was relayed to nurse navigators at [facility] by Electa Sniff RN on 07/28/2019. Addendum by Electa Sniff RN on 07/28/2019. Electronically Signed   By: Ammie Ferrier M.D.   On: 07/28/2019 13:08   Result Date: 07/31/2019 CLINICAL DATA:  43 year old female presenting for ultrasound-guided biopsy of a right breast mass and right axillary lymph node. EXAM: ULTRASOUND GUIDED RIGHT BREAST CORE NEEDLE BIOPSY COMPARISON:  Previous exam(s). FINDINGS: I met with the patient and we discussed the procedure of  ultrasound-guided biopsy, including benefits and alternatives. We discussed the high likelihood of a successful procedure. We discussed the risks of the procedure, including infection, bleeding, tissue injury, clip migration, and inadequate sampling. Informed written consent was given. The usual time-out protocol was performed immediately prior to the procedure. #1 Lesion quadrant: Lower-inner quadrant Using sterile technique and 1% Lidocaine as local anesthetic, under direct ultrasound visualization, a 14 gauge spring-loaded device was used to perform biopsy of a mass in the right breast at 5 o'clock using an inferior approach. At the conclusion of the procedure a heart shaped tissue marker clip was deployed into the biopsy cavity. -------------------------------------------------------------------------------------------------------------------------------------------- #2 Lesion quadrant: Right axilla Using sterile technique and 1% Lidocaine as local anesthetic, under direct ultrasound visualization, a 14 gauge spring-loaded device was used to perform biopsy of a right axillary lymph node using an inferior approach. At the conclusion of the procedure a HydroMARK shape 3 tissue marker clip was deployed into the biopsy cavity. Follow up 2 view mammogram was performed and dictated separately. IMPRESSION: 1. Ultrasound guided biopsy of a right breast mass at 5 o'clock. No apparent complications. 2. Ultrasound guided biopsy of a right axillary lymph node. No apparent complications. Electronically Signed: By: Ammie Ferrier M.D. On: 07/26/2019 09:12   [place] RT BREAST BX W LOC DEV EA ADD LESION IMG BX SPEC US GUIDE  Addendum Date: 07/31/2019   ADDENDUM REPORT: 07/31/2019 11:24 ADDENDUM: Surgical consultation was arranged for patient to see DR. Benjamine Sprague on 07/31/2019. Addendum by Electa Sniff RN on 07/31/2019. Electronically Signed   By: Ammie Ferrier M.D.   On: 07/31/2019 11:24   Addendum Date: 07/28/2019     ADDENDUM REPORT: 07/28/2019 13:08 ADDENDUM: PATHOLOGY  revealed: A. RIGHT BREAST, 5:00, 3CMFN; ULTRASOUND-GUIDED NEEDLE CORE BIOPSY: - INVASIVE MAMMARY CARCINOMA, NO SPECIAL TYPE. 11 mm in this sample. Grade 3. Ductal carcinoma in situ: Not identified. Lymphovascular invasion: Not identified. Comment: The definitive grade will be assigned on the excisional specimen. B. LYMPH NODE, RIGHT AXILLARY; ULTRASOUND-GUIDED NEEDLE CORE BIOPSY: - POSITIVE FOR METASTATIC CARCINOMA, 10 MM IN THIS SAMPLE. Pathology results are CONCORDANT with imaging findings, per Dr. Ammie Ferrier. Pathology results were discussed with patient via telephone. The patient reported doing well after the biopsy with tenderness at the site. Post biopsy care instructions were reviewed and questions were answered. The patient was encouraged to call [facility] for any additional concerns. Recommendation: Surgical referral. Request for surgical referral was relayed to nurse navigators at [facility] by Electa Sniff RN on 07/28/2019. Addendum by Electa Sniff RN on 07/28/2019. Electronically Signed   By: Ammie Ferrier M.D.   On: 07/28/2019 13:08   Result Date: 07/31/2019 CLINICAL DATA:  43 year old female presenting for ultrasound-guided biopsy of a right breast mass and right axillary lymph node. EXAM: ULTRASOUND GUIDED RIGHT BREAST CORE NEEDLE BIOPSY COMPARISON:  Previous exam(s). FINDINGS: I met with the patient and we discussed the procedure of ultrasound-guided biopsy, including benefits and alternatives. We discussed the high likelihood of a successful procedure. We discussed the risks of the procedure, including infection, bleeding, tissue injury, clip migration, and inadequate sampling. Informed written consent was given. The usual time-out protocol was performed immediately prior to the procedure. #1 Lesion quadrant: Lower-inner quadrant Using sterile technique and 1% Lidocaine as local anesthetic, under direct  ultrasound visualization, a 14 gauge spring-loaded device was used to perform biopsy of a mass in the right breast at 5 o'clock using an inferior approach. At the conclusion of the procedure a heart shaped tissue marker clip was deployed into the biopsy cavity. -------------------------------------------------------------------------------------------------------------------------------------------- #2 Lesion quadrant: Right axilla Using sterile technique and 1% Lidocaine as local anesthetic, under direct ultrasound visualization, a 14 gauge spring-loaded device was used to perform biopsy of a right axillary lymph node using an inferior approach. At the conclusion of the procedure a HydroMARK shape 3 tissue marker clip was deployed into the biopsy cavity. Follow up 2 view mammogram was performed and dictated separately. IMPRESSION: 1. Ultrasound guided biopsy of a right breast mass at 5 o'clock. No apparent complications. 2. Ultrasound guided biopsy of a right axillary lymph node. No apparent complications. Electronically Signed: By: Ammie Ferrier M.D. On: 07/26/2019 09:12     ASSESSMENT & PLAN:  1. Invasive carcinoma of breast ([place])   2. Iron deficiency   3. Encounter for antineoplastic chemotherapy   4. Normocytic anemia     Invasive right breast cancer,  Cancer Staging Malignant neoplasm of lower-inner quadrant of right breast of female, estrogen receptor positive ([place]) Staging form: Breast, AJCC 8th Edition - Clinical stage from 07/26/2019: Stage IIB (cT2, cN1, cM0, G3, ER+, PR+, HER2-) - Unsigned -Status post 4 cycles of dose dense Adriamycin and Cytoxan.  Tolerated well. Interval mammogram and axillary ultrasound images were reviewed and discussed with patient.  Right breast mass has decreased in size. Labs are reviewed and discussed with patient. I explained to the patient the risks and benefits of chemotherapy Taxolincluding all but not limited to infusion reaction, hair loss, mouth  sore, nausea, vomiting, low blood counts,Neuropathy, bleeding, heart failure, kidney failure and risk of life threatening infection and even death, secondary malignancy etc.   Patient voices understanding and willing to proceed  chemotherapy.  Proceed with cycle 1 Taxol today.-With scalp cooling system.   Anemia,  Multifactorial, from iron deficiency/ -Continue oral iron supplementation.  All questions were answered. The patient knows to call the clinic with any problems, questions or concerns. No barriers to learning was detected.     Earlie Server, MD 10/13/2019

## 2019-10-13 NOTE — Progress Notes (Signed)
Patient reports having bloody stools for 2 days after treatment.

## 2019-10-20 ENCOUNTER — Inpatient Hospital Stay: Payer: BC Managed Care – PPO

## 2019-10-20 ENCOUNTER — Encounter: Payer: Self-pay | Admitting: Oncology

## 2019-10-20 ENCOUNTER — Other Ambulatory Visit: Payer: Self-pay

## 2019-10-20 ENCOUNTER — Inpatient Hospital Stay (HOSPITAL_BASED_OUTPATIENT_CLINIC_OR_DEPARTMENT_OTHER): Payer: BC Managed Care – PPO | Admitting: Oncology

## 2019-10-20 VITALS — BP 119/76 | HR 106 | Resp 16 | Wt 216.0 lb

## 2019-10-20 DIAGNOSIS — Z5111 Encounter for antineoplastic chemotherapy: Secondary | ICD-10-CM | POA: Diagnosis not present

## 2019-10-20 DIAGNOSIS — C50919 Malignant neoplasm of unspecified site of unspecified female breast: Secondary | ICD-10-CM

## 2019-10-20 DIAGNOSIS — D649 Anemia, unspecified: Secondary | ICD-10-CM | POA: Diagnosis not present

## 2019-10-20 DIAGNOSIS — E876 Hypokalemia: Secondary | ICD-10-CM

## 2019-10-20 DIAGNOSIS — Z170 Estrogen receptor positive status [ER+]: Secondary | ICD-10-CM

## 2019-10-20 DIAGNOSIS — C50311 Malignant neoplasm of lower-inner quadrant of right female breast: Secondary | ICD-10-CM | POA: Diagnosis not present

## 2019-10-20 LAB — CBC WITH DIFFERENTIAL/PLATELET
Abs Immature Granulocytes: 0.11 10*3/uL — ABNORMAL HIGH (ref 0.00–0.07)
Basophils Absolute: 0.1 10*3/uL (ref 0.0–0.1)
Basophils Relative: 1 %
Eosinophils Absolute: 0.2 10*3/uL (ref 0.0–0.5)
Eosinophils Relative: 2 %
HCT: 31.6 % — ABNORMAL LOW (ref 36.0–46.0)
Hemoglobin: 9.9 g/dL — ABNORMAL LOW (ref 12.0–15.0)
Immature Granulocytes: 1 %
Lymphocytes Relative: 14 %
Lymphs Abs: 1.2 10*3/uL (ref 0.7–4.0)
MCH: 28.1 pg (ref 26.0–34.0)
MCHC: 31.3 g/dL (ref 30.0–36.0)
MCV: 89.8 fL (ref 80.0–100.0)
Monocytes Absolute: 0.8 10*3/uL (ref 0.1–1.0)
Monocytes Relative: 10 %
Neutro Abs: 6 10*3/uL (ref 1.7–7.7)
Neutrophils Relative %: 72 %
Platelets: 377 10*3/uL (ref 150–400)
RBC: 3.52 MIL/uL — ABNORMAL LOW (ref 3.87–5.11)
RDW: 17.5 % — ABNORMAL HIGH (ref 11.5–15.5)
WBC: 8.3 10*3/uL (ref 4.0–10.5)
nRBC: 0 % (ref 0.0–0.2)

## 2019-10-20 LAB — COMPREHENSIVE METABOLIC PANEL
ALT: 21 U/L (ref 0–44)
AST: 19 U/L (ref 15–41)
Albumin: 3.9 g/dL (ref 3.5–5.0)
Alkaline Phosphatase: 81 U/L (ref 38–126)
Anion gap: 9 (ref 5–15)
BUN: 11 mg/dL (ref 6–20)
CO2: 24 mmol/L (ref 22–32)
Calcium: 9.1 mg/dL (ref 8.9–10.3)
Chloride: 104 mmol/L (ref 98–111)
Creatinine, Ser: 0.7 mg/dL (ref 0.44–1.00)
GFR calc Af Amer: >60 mL/min (ref >60)
GFR calc non Af Amer: >60 mL/min (ref >60)
Glucose, Bld: 163 mg/dL — ABNORMAL HIGH (ref 70–99)
Potassium: 3.4 mmol/L — ABNORMAL LOW (ref 3.5–5.1)
Sodium: 137 mmol/L (ref 135–145)
Total Bilirubin: 0.6 mg/dL (ref 0.3–1.2)
Total Protein: 7 g/dL (ref 6.5–8.1)

## 2019-10-20 MED ORDER — SODIUM CHLORIDE 0.9 % IV SOLN
80.0000 mg/m2 | Freq: Once | INTRAVENOUS | Status: AC
  Administered 2019-10-20: 168 mg via INTRAVENOUS
  Filled 2019-10-20: qty 28

## 2019-10-20 MED ORDER — HEPARIN SOD (PORK) LOCK FLUSH 100 UNIT/ML IV SOLN
500.0000 [IU] | Freq: Once | INTRAVENOUS | Status: AC
  Administered 2019-10-20: 500 [IU] via INTRAVENOUS
  Filled 2019-10-20: qty 5

## 2019-10-20 MED ORDER — SODIUM CHLORIDE 0.9% FLUSH
10.0000 mL | INTRAVENOUS | Status: DC | PRN
  Administered 2019-10-20: 10 mL via INTRAVENOUS
  Filled 2019-10-20: qty 10

## 2019-10-20 MED ORDER — SODIUM CHLORIDE 0.9 % IV SOLN
20.0000 mg | Freq: Once | INTRAVENOUS | Status: AC
  Administered 2019-10-20: 20 mg via INTRAVENOUS
  Filled 2019-10-20: qty 2

## 2019-10-20 MED ORDER — SODIUM CHLORIDE 0.9 % IV SOLN
Freq: Once | INTRAVENOUS | Status: AC
  Filled 2019-10-20: qty 250

## 2019-10-20 MED ORDER — FAMOTIDINE IN NACL 20-0.9 MG/50ML-% IV SOLN
20.0000 mg | Freq: Once | INTRAVENOUS | Status: AC
  Administered 2019-10-20: 20 mg via INTRAVENOUS
  Filled 2019-10-20: qty 50

## 2019-10-20 MED ORDER — DIPHENHYDRAMINE HCL 50 MG/ML IJ SOLN
50.0000 mg | Freq: Once | INTRAMUSCULAR | Status: AC
  Administered 2019-10-20: 50 mg via INTRAVENOUS
  Filled 2019-10-20: qty 1

## 2019-10-20 NOTE — Progress Notes (Signed)
[place]   [phone] Fax:([phone]) [phone]   Clinic Follow up Note   Patient Care Team: Ricardo Jericho, NP as PCP - General (Family Medicine) Rico Junker, RN as Registered Nurse  Date of Service:  10/20/2019  CHIEF COMPLAINT: F/u of right breast cancer   SUMMARY OF ONCOLOGIC HISTORY: Oncology History Overview Note  Cancer Staging Malignant neoplasm of lower-inner quadrant of right breast of female, estrogen receptor positive ([place]) Staging form: Breast, AJCC 8th Edition - Clinical stage from 07/26/2019: Stage IIB (cT2, cN1, cM0, G3, ER+, PR+, HER2-) - Unsigned    Malignant neoplasm of lower-inner quadrant of right breast of female, estrogen receptor positive ([place])  07/20/2019 Mammogram   Mammogram 07/20/19  IMPRESSION: Suspicious palpable right breast mass 3.4 x 2.5 x 3.4 cm 5 o'clock position 3 cm from nipple.   Suspicious 1.6cm cortically thickened right axillary lymph node.   07/26/2019 Initial Diagnosis   DIAGNOSIS: 07/26/19 A. RIGHT BREAST, 5:00, 3CMFN; ULTRASOUND-GUIDED NEEDLE CORE BIOPSY:  - INVASIVE MAMMARY CARCINOMA, NO SPECIAL TYPE.    07/26/2019 Receptors her2   BREAST BIOMARKER TESTS  Estrogen Receptor (ER) Status: POSITIVE                       Percentage of cells with nuclear positivity: 51-90%                       Average intensity of staining: Strong   Progesterone Receptor (PgR) Status: POSITIVE                       Percentage of cells with nuclear positivity:  Greater than 90%                       Average intensity of staining: Strong   HER2 (by immunohistochemistry): NEGATIVE (Score 1+)    08/05/2019 Initial Diagnosis   Invasive carcinoma of breast ([place])   08/17/2019 Surgery   INSERTION PORT-A-CATH by Dr. Lysle Pearl 08/17/19    08/18/2019 -  Chemotherapy   AC q2weeks for 4 cycles starting 08/18/19 followed by weekly Taxol for 12 weeks       CURRENT THERAPY:  Neoadjuvant chemo ddAC starting 08/18/19 for 4  cycles followed by weekly Taxol   INTERVAL HISTORY:  Leonette Nutting presents to follow-up for evaluation prior to chemotherapy for breast cancer treatment. Patient has completed 4 cycles of dose dense Adriamycin and Cytoxan with scalp cooling system with Dr.Feng at [facility]. S/p cycle 1 Taxol last week.  Tolerates well with mild to moderate difficulties. She reports that her sciatic pain has worsened, bothering her sleep at night. Denies any numbness or tingling of fingertips or toes. Chronic fatigue, unchanged.  Usually worsen after chemotherapy. No nausea vomiting, diarrhea. She uses scalp cooling system during the chemo infusion.  Tolerates well.   Review of Systems  Constitutional: Positive for fatigue. Negative for appetite change, chills and fever.  HENT:   Negative for hearing loss and voice change.   Eyes: Negative for eye problems.  Respiratory: Negative for chest tightness and cough.   Cardiovascular: Negative for chest pain.  Gastrointestinal: Negative for abdominal distention and abdominal pain.  Endocrine: Negative for hot flashes.  Genitourinary: Negative for difficulty urinating and frequency.   Musculoskeletal: Negative for arthralgias.  Skin: Negative for itching and rash.  Neurological: Negative for extremity weakness.  Hematological: Negative for adenopathy.  Psychiatric/Behavioral: Negative for confusion.  MEDICAL HISTORY:  Past Medical History:  Diagnosis Date  . Breast cancer ([place])    right breast  . Cancer ([place])    breast cancer  . Frequency   . Kidney stones   . Obesity   . Personal history of chemotherapy   . Right flank pain     SURGICAL HISTORY: Past Surgical History:  Procedure Laterality Date  . BREAST BIOPSY    . CESAREAN SECTION     x 2  . CHOLECYSTECTOMY    . DILATION AND CURETTAGE OF UTERUS    . PORTACATH PLACEMENT Left 08/17/2019   Procedure: INSERTION PORT-A-CATH;  Surgeon: Benjamine Sprague, DO;  Location: [facility] ORS;  Service:  General;  Laterality: Left;  . TUBAL LIGATION      I have reviewed the social history and family history with the patient and they are unchanged from previous note.  ALLERGIES:  has No Known Allergies.  MEDICATIONS:  Current Outpatient Medications  Medication Sig Dispense Refill  . ferrous sulfate 325 (65 FE) MG EC tablet TAKE 1 TABLET (325 MG TOTAL) BY MOUTH 2 (TWO) TIMES DAILY WITH A MEAL. 60 tablet 1  . gabapentin (NEURONTIN) 600 MG tablet Take 600 mg by mouth 3 (three) times daily.    Marland Kitchen lidocaine-prilocaine (EMLA) cream Apply to affected area once 30 g 3  . omeprazole (PRILOSEC) 20 MG capsule TAKE 1 CAPSULE BY MOUTH EVERY DAY (Patient taking differently: daily as needed. TAKE 1 CAPSULE BY MOUTH EVERY DAY) 90 capsule 0  . prochlorperazine (COMPAZINE) 10 MG tablet Take 1 tablet (10 mg total) by mouth every 6 (six) hours as needed (Nausea or vomiting). 30 tablet 1  . sucralfate (CARAFATE) 1 g tablet Take 1 tablet (1 g total) by mouth 2 (two) times daily. 60 tablet 0   No current facility-administered medications for this visit.   Facility-Administered Medications Ordered in Other Visits  Medication Dose Route Frequency Provider Last Rate Last Admin  . heparin lock flush 100 unit/mL  500 Units Intravenous Once Earlie Server, MD      . sodium chloride flush (NS) 0.9 % injection 10 mL  10 mL Intravenous PRN Earlie Server, MD   10 mL at 10/20/19 0828    PHYSICAL EXAMINATION: ECOG PERFORMANCE STATUS: 0 - Asymptomatic  Vitals:   10/20/19 0839  BP: 119/76  Pulse: (!) 106  Resp: 16  SpO2: 98%   Filed Weights   10/20/19 0839  Weight: 216 lb (98 kg)   Physical Exam  Constitutional: She is oriented to person, place, and time. No distress.  HENT:  Head: Normocephalic and atraumatic.  Nose: Nose normal.  Mouth/Throat: Oropharynx is clear and moist. No oropharyngeal exudate.  Eyes: Pupils are equal, round, and reactive to light. EOM are normal. No scleral icterus.  Cardiovascular: Normal rate  and regular rhythm.  No murmur heard. Pulmonary/Chest: Effort normal. No respiratory distress. She has no rales. She exhibits no tenderness.  Abdominal: Soft. She exhibits no distension. There is no abdominal tenderness.  Musculoskeletal:        General: No edema. Normal range of motion.     Cervical back: Normal range of motion and neck supple.  Neurological: She is alert and oriented to person, place, and time. No cranial nerve deficit. She exhibits normal muscle tone. Coordination normal.  Skin: Skin is warm and dry. She is not diaphoretic. No erythema.  No alopecia  Psychiatric: Affect normal.     LABORATORY DATA:  I have reviewed the data as listed  CBC Latest Ref Rng & Units 10/13/2019 09/29/2019 09/15/2019  WBC 4.0 - 10.5 K/uL 12.8(H) 8.9 13.2(H)  Hemoglobin 12.0 - 15.0 g/dL 10.3(L) 10.5(L) 11.1(L)  Hematocrit 36.0 - 46.0 % 33.0(L) 33.2(L) 35.2(L)  Platelets 150 - 400 K/uL 231 226 276     CMP Latest Ref Rng & Units 10/13/2019 09/29/2019 09/15/2019  Glucose 70 - 99 mg/dL 143(H) 152(H) 89  BUN 6 - 20 mg/dL _0 Creatinine 0.44 - 1.00 mg/dL 0.68 0.79 0.74  Sodium 135 - 145 mmol/L 139 140 141  Potassium 3.5 - 5.1 mmol/L 3.7 3.8 3.9  Chloride 98 - 111 mmol/L 104 104 105  CO2 22 - 32 mmol/L _1 Calcium 8.9 - 10.3 mg/dL 8.9 9.0 9.2  Total Protein 6.5 - 8.1 g/dL 7.0 6.9 7.2  Total Bilirubin 0.3 - 1.2 mg/dL 0.4 0.3 0.2(L)  Alkaline Phos 38 - 126 U/L 103 108 110  AST 15 - 41 U/L 18 13(L) 10(L)  ALT 0 - 44 U/L _2 RADIOGRAPHIC STUDIES: I have personally reviewed the radiological images as listed and agreed with the findings in the report. DG Chest 1 View  Result Date: 08/17/2019 CLINICAL DATA:  Post port placement, breast cancer EXAM: CHEST  1 VIEW COMPARISON:  01/20/2010 FINDINGS: Interval placement of LEFT-sided power port, tip overlying the level of superior vena cava. Shallow lung inflation. Heart size is normal. No pulmonary edema. No pneumothorax  following the procedure. IMPRESSION: Interval placement of LEFT-sided power port. No adverse features. Electronically Signed   By: Nolon Nations M.D.   On: 08/17/2019 09:17   DG Abd 1 View  Result Date: 07/25/2019 CLINICAL DATA:  Flank pain EXAM: ABDOMEN - 1 VIEW COMPARISON:  None. FINDINGS: The bowel gas pattern is normal. Surgical clips seen in the right upper quadrant. Moderate amount of colonic stool. No radio-opaque calculi or other significant radiographic abnormality are seen. IMPRESSION: No calculi seen overlying the renal shadows. Electronically Signed   By: Prudencio Pair M.D.   On: 07/25/2019 14:16   NM Cardiac Muga Rest  Result Date: 08/09/2019 CLINICAL DATA:  Invasive breast cancer, pre cardiotoxic chemotherapy EXAM: NUCLEAR MEDICINE CARDIAC BLOOD POOL IMAGING (MUGA) TECHNIQUE: Cardiac multi-gated acquisition was performed at rest following intravenous injection of Tc-56mlabeled red blood cells. RADIOPHARMACEUTICALS:  19.95 mCi Tc-930mertechnetate in-vitro labeled red blood cells IV COMPARISON:  None FINDINGS: Calculated LEFT ventricular ejection fraction is 59.5%, normal. Study was obtained at a cardiac rate of 79 bpm. Patient was rhythmic during imaging. Cine analysis of the LEFT ventricle in 3 projections demonstrates normal LV wall motion. IMPRESSION: Normal LEFT ventricular ejection fraction of 59.5%. Normal LV wall motion. Electronically Signed   By: MaLavonia Dana.D.   On: 08/09/2019 16:18   DG C-Arm 1-60 Min-No Report  Result Date: 08/17/2019 Fluoroscopy was utilized by the requesting physician.  No radiographic interpretation.   USKoreareast Limited Uni Right Inc Axilla  Result Date: 10/02/2019 CLINICAL DATA:  [age]ear old female with biopsy proven invasive mammary carcinoma of the right breast and metastatic axillary adenopathy. Assess response to neoadjuvant chemotherapy. EXAM: DIGITAL DIAGNOSTIC RIGHT MAMMOGRAM WITH CAD AND TOMO ULTRASOUND RIGHT BREAST COMPARISON:  Previous  exam(s). ACR Breast Density Category b: There are scattered areas of fibroglandular density. FINDINGS: There is a 2.1 cm mass in the lower inner quadrant of the right breast. There is a heart shaped clip located in the mass. There is an enlarged right axillary lymph  node with a HydroMARK clip. No additional mass is seen in the right breast. There are no malignant type microcalcifications. Mammographic images were processed with CAD. Targeted ultrasound is performed, showing there is an irregular hypoechoic mass in the right breast at 5 o'clock 3 cm from the nipple measuring 2.8 x 1.6 x 2.8 cm. There is a solitary enlarged axillary lymph node measuring 3.3 x 1.5 cm (cortical thickening 1.5 cm). On the prior ultrasound dated 07/20/2019 the mass in the right breast at 5 o'clock 3 cm from the nipple measured 3.4 x 2.4 x 3.4 cm and the lymph node cortical thickening measured 1.6 cm. IMPRESSION: Interval reduction in the size of the mass in the 5 o'clock region of the right breast now measuring 2.8 cm versus 3.3 cm on the prior exam. The axillary lymph node is relatively unchanged. RECOMMENDATION: Continued treatment planning of the known right breast invasive mammary carcinoma and metastatic axillary adenopathy is recommended. I have discussed the findings and recommendations with the patient. If applicable, a reminder letter will be sent to the patient regarding the next appointment. BI-RADS CATEGORY  6: Known biopsy-proven malignancy. Electronically Signed   By: Lillia Mountain M.D.   On: 10/02/2019 15:29   MM DIAG BREAST TOMO UNI RIGHT  Result Date: 10/02/2019 CLINICAL DATA:  43 year old female with biopsy proven invasive mammary carcinoma of the right breast and metastatic axillary adenopathy. Assess response to neoadjuvant chemotherapy. EXAM: DIGITAL DIAGNOSTIC RIGHT MAMMOGRAM WITH CAD AND TOMO ULTRASOUND RIGHT BREAST COMPARISON:  Previous exam(s). ACR Breast Density Category b: There are scattered areas of  fibroglandular density. FINDINGS: There is a 2.1 cm mass in the lower inner quadrant of the right breast. There is a heart shaped clip located in the mass. There is an enlarged right axillary lymph node with a HydroMARK clip. No additional mass is seen in the right breast. There are no malignant type microcalcifications. Mammographic images were processed with CAD. Targeted ultrasound is performed, showing there is an irregular hypoechoic mass in the right breast at 5 o'clock 3 cm from the nipple measuring 2.8 x 1.6 x 2.8 cm. There is a solitary enlarged axillary lymph node measuring 3.3 x 1.5 cm (cortical thickening 1.5 cm). On the prior ultrasound dated 07/20/2019 the mass in the right breast at 5 o'clock 3 cm from the nipple measured 3.4 x 2.4 x 3.4 cm and the lymph node cortical thickening measured 1.6 cm. IMPRESSION: Interval reduction in the size of the mass in the 5 o'clock region of the right breast now measuring 2.8 cm versus 3.3 cm on the prior exam. The axillary lymph node is relatively unchanged. RECOMMENDATION: Continued treatment planning of the known right breast invasive mammary carcinoma and metastatic axillary adenopathy is recommended. I have discussed the findings and recommendations with the patient. If applicable, a reminder letter will be sent to the patient regarding the next appointment. BI-RADS CATEGORY  6: Known biopsy-proven malignancy. Electronically Signed   By: Lillia Mountain M.D.   On: 10/02/2019 15:29   MM CLIP PLACEMENT RIGHT  Result Date: 07/26/2019 CLINICAL DATA:  Post biopsy mammogram of the right breast for clip placement. EXAM: DIAGNOSTIC RIGHT MAMMOGRAM POST ULTRASOUND BIOPSY COMPARISON:  Previous exam(s). FINDINGS: Mammographic images were obtained following ultrasound guided biopsy of a mass in the right breast and a right axillary lymph node. The heart shaped biopsy marking clip is well positioned within the mass in the lower-inner right breast. The spiral shaped HydroMARK  clip is well positioned in the  right axillary lymph node. IMPRESSION: 1. Appropriate positioning of the heart shaped biopsy marking clip in the mass in the lower-inner right breast. 2. Appropriate positioning of the spiral shaped HydroMARK clip in the right axillary lymph node. Final Assessment: Post Procedure Mammograms for Marker Placement Electronically Signed   By: Ammie Ferrier M.D.   On: 07/26/2019 09:13   [place] RT BREAST BX W LOC DEV 1ST LESION IMG BX SPEC US GUIDE  Addendum Date: 07/31/2019   ADDENDUM REPORT: 07/31/2019 11:24 ADDENDUM: Surgical consultation was arranged for patient to see DR. Benjamine Sprague on 07/31/2019. Addendum by Electa Sniff RN on 07/31/2019. Electronically Signed   By: Ammie Ferrier M.D.   On: 07/31/2019 11:24   Addendum Date: 07/28/2019   ADDENDUM REPORT: 07/28/2019 13:08 ADDENDUM: PATHOLOGY revealed: A. RIGHT BREAST, 5:00, 3CMFN; ULTRASOUND-GUIDED NEEDLE CORE BIOPSY: - INVASIVE MAMMARY CARCINOMA, NO SPECIAL TYPE. 11 mm in this sample. Grade 3. Ductal carcinoma in situ: Not identified. Lymphovascular invasion: Not identified. Comment: The definitive grade will be assigned on the excisional specimen. B. LYMPH NODE, RIGHT AXILLARY; ULTRASOUND-GUIDED NEEDLE CORE BIOPSY: - POSITIVE FOR METASTATIC CARCINOMA, 10 MM IN THIS SAMPLE. Pathology results are CONCORDANT with imaging findings, per Dr. Ammie Ferrier. Pathology results were discussed with patient via telephone. The patient reported doing well after the biopsy with tenderness at the site. Post biopsy care instructions were reviewed and questions were answered. The patient was encouraged to call [facility] for any additional concerns. Recommendation: Surgical referral. Request for surgical referral was relayed to nurse navigators at [facility] by Electa Sniff RN on 07/28/2019. Addendum by Electa Sniff RN on 07/28/2019. Electronically Signed   By: Ammie Ferrier M.D.   On: 07/28/2019 13:08   Result  Date: 07/31/2019 CLINICAL DATA:  43 year old female presenting for ultrasound-guided biopsy of a right breast mass and right axillary lymph node. EXAM: ULTRASOUND GUIDED RIGHT BREAST CORE NEEDLE BIOPSY COMPARISON:  Previous exam(s). FINDINGS: I met with the patient and we discussed the procedure of ultrasound-guided biopsy, including benefits and alternatives. We discussed the high likelihood of a successful procedure. We discussed the risks of the procedure, including infection, bleeding, tissue injury, clip migration, and inadequate sampling. Informed written consent was given. The usual time-out protocol was performed immediately prior to the procedure. #1 Lesion quadrant: Lower-inner quadrant Using sterile technique and 1% Lidocaine as local anesthetic, under direct ultrasound visualization, a 14 gauge spring-loaded device was used to perform biopsy of a mass in the right breast at 5 o'clock using an inferior approach. At the conclusion of the procedure a heart shaped tissue marker clip was deployed into the biopsy cavity. -------------------------------------------------------------------------------------------------------------------------------------------- #2 Lesion quadrant: Right axilla Using sterile technique and 1% Lidocaine as local anesthetic, under direct ultrasound visualization, a 14 gauge spring-loaded device was used to perform biopsy of a right axillary lymph node using an inferior approach. At the conclusion of the procedure a HydroMARK shape 3 tissue marker clip was deployed into the biopsy cavity. Follow up 2 view mammogram was performed and dictated separately. IMPRESSION: 1. Ultrasound guided biopsy of a right breast mass at 5 o'clock. No apparent complications. 2. Ultrasound guided biopsy of a right axillary lymph node. No apparent complications. Electronically Signed: By: Ammie Ferrier M.D. On: 07/26/2019 09:12   [place] RT BREAST BX W LOC DEV EA ADD LESION IMG BX SPEC US  GUIDE  Addendum Date: 07/31/2019   ADDENDUM REPORT: 07/31/2019 11:24 ADDENDUM: Surgical consultation was arranged for patient to see DR. Isami  Sakai on 07/31/2019. Addendum by Electa Sniff RN on 07/31/2019. Electronically Signed   By: Ammie Ferrier M.D.   On: 07/31/2019 11:24   Addendum Date: 07/28/2019   ADDENDUM REPORT: 07/28/2019 13:08 ADDENDUM: PATHOLOGY revealed: A. RIGHT BREAST, 5:00, 3CMFN; ULTRASOUND-GUIDED NEEDLE CORE BIOPSY: - INVASIVE MAMMARY CARCINOMA, NO SPECIAL TYPE. 11 mm in this sample. Grade 3. Ductal carcinoma in situ: Not identified. Lymphovascular invasion: Not identified. Comment: The definitive grade will be assigned on the excisional specimen. B. LYMPH NODE, RIGHT AXILLARY; ULTRASOUND-GUIDED NEEDLE CORE BIOPSY: - POSITIVE FOR METASTATIC CARCINOMA, 10 MM IN THIS SAMPLE. Pathology results are CONCORDANT with imaging findings, per Dr. Ammie Ferrier. Pathology results were discussed with patient via telephone. The patient reported doing well after the biopsy with tenderness at the site. Post biopsy care instructions were reviewed and questions were answered. The patient was encouraged to call [facility] for any additional concerns. Recommendation: Surgical referral. Request for surgical referral was relayed to nurse navigators at [facility] by Electa Sniff RN on 07/28/2019. Addendum by Electa Sniff RN on 07/28/2019. Electronically Signed   By: Ammie Ferrier M.D.   On: 07/28/2019 13:08   Result Date: 07/31/2019 CLINICAL DATA:  43 year old female presenting for ultrasound-guided biopsy of a right breast mass and right axillary lymph node. EXAM: ULTRASOUND GUIDED RIGHT BREAST CORE NEEDLE BIOPSY COMPARISON:  Previous exam(s). FINDINGS: I met with the patient and we discussed the procedure of ultrasound-guided biopsy, including benefits and alternatives. We discussed the high likelihood of a successful procedure. We discussed the risks of the procedure, including  infection, bleeding, tissue injury, clip migration, and inadequate sampling. Informed written consent was given. The usual time-out protocol was performed immediately prior to the procedure. #1 Lesion quadrant: Lower-inner quadrant Using sterile technique and 1% Lidocaine as local anesthetic, under direct ultrasound visualization, a 14 gauge spring-loaded device was used to perform biopsy of a mass in the right breast at 5 o'clock using an inferior approach. At the conclusion of the procedure a heart shaped tissue marker clip was deployed into the biopsy cavity. -------------------------------------------------------------------------------------------------------------------------------------------- #2 Lesion quadrant: Right axilla Using sterile technique and 1% Lidocaine as local anesthetic, under direct ultrasound visualization, a 14 gauge spring-loaded device was used to perform biopsy of a right axillary lymph node using an inferior approach. At the conclusion of the procedure a HydroMARK shape 3 tissue marker clip was deployed into the biopsy cavity. Follow up 2 view mammogram was performed and dictated separately. IMPRESSION: 1. Ultrasound guided biopsy of a right breast mass at 5 o'clock. No apparent complications. 2. Ultrasound guided biopsy of a right axillary lymph node. No apparent complications. Electronically Signed: By: Ammie Ferrier M.D. On: 07/26/2019 09:12     ASSESSMENT & PLAN:  1. Malignant neoplasm of lower-inner quadrant of right breast of female, estrogen receptor positive ([place])   2. Encounter for antineoplastic chemotherapy   3. Normocytic anemia   4. Hypokalemia     Invasive right breast cancer,  Cancer Staging Malignant neoplasm of lower-inner quadrant of right breast of female, estrogen receptor positive ([place]) Staging form: Breast, AJCC 8th Edition - Clinical stage from 07/26/2019: Stage IIB (cT2, cN1, cM0, G3, ER+, PR+, HER2-) - Unsigned -Status post 4 cycles of dose  dense Adriamycin and Cytoxan.   -Status post cycle 1 Taxol 1 week ago.  Overall tolerates well. Labs are reviewed and discussed with patient.  Counts acceptable to proceed with cycle 2 Taxol today.  #Worsening sciatic pain, pre-existing.  Discussed with  patient that Taxol may worsen her neuropathy. Since she has most of the symptoms at night, I recommend patient to continue take gabapentin 300 mg twice during the day, increase night dose of gabapentin to 600 mg and see if her symptoms improves  Anemia, multifactorial.  From iron deficiency, chemotherapy. Continue oral iron supplementations. Tachycardia, heart rate 106, regular beats on auscultation.  She is asymptomatic.  Continue to monitor.  Encourage oral hydration. Hypokalemia, potassium is mildly low at 3.4.  Encourage patient to take potassium enriched food and also electrolyte drinks, i.e. Gatorade. We will monitor her potassium levels weekly.   All questions were answered. The patient knows to call the clinic with any problems, questions or concerns. No barriers to learning was detected.     Earlie Server, MD 10/20/2019

## 2019-10-20 NOTE — Progress Notes (Signed)
Patient here for follow up. States that since starting taxol she has had increased joint pain. No new breast issues.

## 2019-10-20 NOTE — Progress Notes (Signed)
Hr 106, per Dr. Tasia Catchings okay to proceed with Taxol treatment as scheduled.    Pt tolerated infusion well. Dignicap performed per protocol. Pt stable at discharge.

## 2019-10-30 ENCOUNTER — Inpatient Hospital Stay (HOSPITAL_BASED_OUTPATIENT_CLINIC_OR_DEPARTMENT_OTHER): Payer: BC Managed Care – PPO | Admitting: Oncology

## 2019-10-30 ENCOUNTER — Inpatient Hospital Stay: Payer: BC Managed Care – PPO

## 2019-10-30 ENCOUNTER — Other Ambulatory Visit: Payer: Self-pay

## 2019-10-30 ENCOUNTER — Encounter: Payer: Self-pay | Admitting: Oncology

## 2019-10-30 VITALS — BP 115/76 | HR 91 | Temp 97.2°F | Resp 18 | Wt 216.2 lb

## 2019-10-30 DIAGNOSIS — Z5111 Encounter for antineoplastic chemotherapy: Secondary | ICD-10-CM

## 2019-10-30 DIAGNOSIS — C50311 Malignant neoplasm of lower-inner quadrant of right female breast: Secondary | ICD-10-CM

## 2019-10-30 DIAGNOSIS — D649 Anemia, unspecified: Secondary | ICD-10-CM | POA: Diagnosis not present

## 2019-10-30 DIAGNOSIS — M5430 Sciatica, unspecified side: Secondary | ICD-10-CM | POA: Diagnosis not present

## 2019-10-30 DIAGNOSIS — C50919 Malignant neoplasm of unspecified site of unspecified female breast: Secondary | ICD-10-CM | POA: Diagnosis not present

## 2019-10-30 DIAGNOSIS — Z170 Estrogen receptor positive status [ER+]: Secondary | ICD-10-CM

## 2019-10-30 DIAGNOSIS — E876 Hypokalemia: Secondary | ICD-10-CM

## 2019-10-30 LAB — COMPREHENSIVE METABOLIC PANEL
ALT: 28 U/L (ref 0–44)
AST: 21 U/L (ref 15–41)
Albumin: 3.9 g/dL (ref 3.5–5.0)
Alkaline Phosphatase: 85 U/L (ref 38–126)
Anion gap: 7 (ref 5–15)
BUN: 9 mg/dL (ref 6–20)
CO2: 27 mmol/L (ref 22–32)
Calcium: 9 mg/dL (ref 8.9–10.3)
Chloride: 104 mmol/L (ref 98–111)
Creatinine, Ser: 0.72 mg/dL (ref 0.44–1.00)
GFR calc Af Amer: >60 mL/min (ref >60)
GFR calc non Af Amer: >60 mL/min (ref >60)
Glucose, Bld: 143 mg/dL — ABNORMAL HIGH (ref 70–99)
Potassium: 3.5 mmol/L (ref 3.5–5.1)
Sodium: 138 mmol/L (ref 135–145)
Total Bilirubin: 0.6 mg/dL (ref 0.3–1.2)
Total Protein: 7.4 g/dL (ref 6.5–8.1)

## 2019-10-30 LAB — CBC WITH DIFFERENTIAL/PLATELET
Abs Immature Granulocytes: 0.02 10*3/uL (ref 0.00–0.07)
Basophils Absolute: 0.1 10*3/uL (ref 0.0–0.1)
Basophils Relative: 1 %
Eosinophils Absolute: 0.4 10*3/uL (ref 0.0–0.5)
Eosinophils Relative: 6 %
HCT: 34 % — ABNORMAL LOW (ref 36.0–46.0)
Hemoglobin: 10.7 g/dL — ABNORMAL LOW (ref 12.0–15.0)
Immature Granulocytes: 0 %
Lymphocytes Relative: 23 %
Lymphs Abs: 1.5 10*3/uL (ref 0.7–4.0)
MCH: 28.4 pg (ref 26.0–34.0)
MCHC: 31.5 g/dL (ref 30.0–36.0)
MCV: 90.2 fL (ref 80.0–100.0)
Monocytes Absolute: 0.8 10*3/uL (ref 0.1–1.0)
Monocytes Relative: 13 %
Neutro Abs: 3.5 10*3/uL (ref 1.7–7.7)
Neutrophils Relative %: 57 %
Platelets: 375 10*3/uL (ref 150–400)
RBC: 3.77 MIL/uL — ABNORMAL LOW (ref 3.87–5.11)
RDW: 17.3 % — ABNORMAL HIGH (ref 11.5–15.5)
WBC: 6.3 10*3/uL (ref 4.0–10.5)
nRBC: 0 % (ref 0.0–0.2)

## 2019-10-30 LAB — MAGNESIUM: Magnesium: 1.9 mg/dL (ref 1.7–2.4)

## 2019-10-30 MED ORDER — SODIUM CHLORIDE 0.9 % IV SOLN
80.0000 mg/m2 | Freq: Once | INTRAVENOUS | Status: AC
  Administered 2019-10-30: 168 mg via INTRAVENOUS
  Filled 2019-10-30: qty 28

## 2019-10-30 MED ORDER — DIPHENHYDRAMINE HCL 50 MG/ML IJ SOLN
50.0000 mg | Freq: Once | INTRAMUSCULAR | Status: AC
  Administered 2019-10-30: 50 mg via INTRAVENOUS
  Filled 2019-10-30: qty 1

## 2019-10-30 MED ORDER — SODIUM CHLORIDE 0.9% FLUSH
10.0000 mL | Freq: Once | INTRAVENOUS | Status: AC
  Administered 2019-10-30: 10 mL via INTRAVENOUS
  Filled 2019-10-30: qty 10

## 2019-10-30 MED ORDER — SODIUM CHLORIDE 0.9 % IV SOLN
Freq: Once | INTRAVENOUS | Status: AC
  Filled 2019-10-30: qty 250

## 2019-10-30 MED ORDER — HEPARIN SOD (PORK) LOCK FLUSH 100 UNIT/ML IV SOLN
500.0000 [IU] | Freq: Once | INTRAVENOUS | Status: AC
  Administered 2019-10-30: 500 [IU] via INTRAVENOUS
  Filled 2019-10-30: qty 5

## 2019-10-30 MED ORDER — HEPARIN SOD (PORK) LOCK FLUSH 100 UNIT/ML IV SOLN
INTRAVENOUS | Status: AC
  Filled 2019-10-30: qty 5

## 2019-10-30 MED ORDER — FAMOTIDINE IN NACL 20-0.9 MG/50ML-% IV SOLN
20.0000 mg | Freq: Once | INTRAVENOUS | Status: AC
  Administered 2019-10-30: 20 mg via INTRAVENOUS
  Filled 2019-10-30: qty 50

## 2019-10-30 MED ORDER — SODIUM CHLORIDE 0.9 % IV SOLN
20.0000 mg | Freq: Once | INTRAVENOUS | Status: AC
  Administered 2019-10-30: 20 mg via INTRAVENOUS
  Filled 2019-10-30: qty 2

## 2019-10-30 NOTE — Progress Notes (Signed)
[place]   [phone] [phone]) [phone]   Clinic Follow up Note   Patient Care Team: Ricardo Jericho, NP as PCP - General (Family Medicine) Rico Junker, RN as Registered Nurse  Date of Service:  10/30/2019  CHIEF COMPLAINT: F/u of right breast cancer   SUMMARY OF ONCOLOGIC HISTORY: Oncology History Overview Note  Cancer Staging Malignant neoplasm of lower-inner quadrant of right breast of female, estrogen receptor positive ([place]) Staging form: Breast, AJCC 8th Edition - Clinical stage from 07/26/2019: Stage IIB (cT2, cN1, cM0, G3, ER+, PR+, HER2-) - Unsigned    Malignant neoplasm of lower-inner quadrant of right breast of female, estrogen receptor positive ([place])  07/20/2019 Mammogram   Mammogram 07/20/19  IMPRESSION: Suspicious palpable right breast mass 3.4 x 2.5 x 3.4 cm 5 o'clock position 3 cm from nipple.   Suspicious 1.6cm cortically thickened right axillary lymph node.   07/26/2019 Initial Diagnosis   DIAGNOSIS: 07/26/19 A. RIGHT BREAST, 5:00, 3CMFN; ULTRASOUND-GUIDED NEEDLE CORE BIOPSY:  - INVASIVE MAMMARY CARCINOMA, NO SPECIAL TYPE.    07/26/2019 Receptors her2   BREAST BIOMARKER TESTS  Estrogen Receptor (ER) Status: POSITIVE                       Percentage of cells with nuclear positivity: 51-90%                       Average intensity of staining: Strong   Progesterone Receptor (PgR) Status: POSITIVE                       Percentage of cells with nuclear positivity:  Greater than 90%                       Average intensity of staining: Strong   HER2 (by immunohistochemistry): NEGATIVE (Score 1+)    08/05/2019 Initial Diagnosis   Invasive carcinoma of breast ([place])   08/17/2019 Surgery   INSERTION PORT-A-CATH by Dr. Lysle Pearl 08/17/19    08/18/2019 -  Chemotherapy   AC q2weeks for 4 cycles starting 08/18/19 followed by weekly Taxol for 12 weeks       CURRENT THERAPY:  Neoadjuvant chemo ddAC starting 08/18/19 for 4  cycles followed by weekly Taxol   INTERVAL HISTORY:  Leonette Nutting presents to follow-up for evaluation prior to chemotherapy for breast cancer treatment. Patient has completed 4 cycles of dose dense Adriamycin and Cytoxan with scalp cooling system with Dr.Feng at [facility]. S/p 2 cycles of weekly Taxol. At last visit, she reports that sciatic pain has worsened.  Bothering her mostly during her sleep. I have suggested patient to increase nighttime gabapentin to 600 mg  Today she reports that her sciatic pain has improved.  She admits that she has stopped the gabapentin for a few days which may have contributed to the worsening left-sided pain.  Now she takes gabapentin 300 mg 3 times a day and feels that her symptoms is better controlled. Denies any numbness or tingling of fingertips or toes. Chronic fatigue, unchanged. No nausea vomiting or diarrhea. Scalp cooling system during the chemo infusion.  She tolerates well. She has experienced some hair loss.    Review of Systems  Constitutional: Positive for fatigue. Negative for appetite change, chills and fever.  HENT:   Negative for hearing loss and voice change.   Eyes: Negative for eye problems.  Respiratory: Negative for chest tightness and cough.  Cardiovascular: Negative for chest pain.  Gastrointestinal: Negative for abdominal distention, abdominal pain and blood in stool.  Endocrine: Negative for hot flashes.  Genitourinary: Negative for difficulty urinating and frequency.   Musculoskeletal: Negative for arthralgias.  Skin: Negative for itching and rash.  Neurological: Negative for extremity weakness.  Hematological: Negative for adenopathy.  Psychiatric/Behavioral: Negative for confusion.    MEDICAL HISTORY:  Past Medical History:  Diagnosis Date  . Breast cancer ([place])    right breast  . Cancer ([place])    breast cancer  . Frequency   . Kidney stones   . Obesity   . Personal history of chemotherapy   . Right  flank pain     SURGICAL HISTORY: Past Surgical History:  Procedure Laterality Date  . BREAST BIOPSY    . CESAREAN SECTION     x 2  . CHOLECYSTECTOMY    . DILATION AND CURETTAGE OF UTERUS    . PORTACATH PLACEMENT Left 08/17/2019   Procedure: INSERTION PORT-A-CATH;  Surgeon: Benjamine Sprague, DO;  Location: [facility] ORS;  Service: General;  Laterality: Left;  . TUBAL LIGATION      I have reviewed the social history and family history with the patient and they are unchanged from previous note.  ALLERGIES:  has No Known Allergies.  MEDICATIONS:  Current Outpatient Medications  Medication Sig Dispense Refill  . Aspirin-Acetaminophen-Caffeine (GOODYS EXTRA STRENGTH PO) Take by mouth.    . ferrous sulfate 325 (65 FE) MG EC tablet TAKE 1 TABLET (325 MG TOTAL) BY MOUTH 2 (TWO) TIMES DAILY WITH A MEAL. 60 tablet 1  . gabapentin (NEURONTIN) 600 MG tablet Take 600 mg by mouth 3 (three) times daily.    Marland Kitchen lidocaine-prilocaine (EMLA) cream Apply to affected area once 30 g 3  . omeprazole (PRILOSEC) 20 MG capsule TAKE 1 CAPSULE BY MOUTH EVERY DAY (Patient taking differently: daily as needed. TAKE 1 CAPSULE BY MOUTH EVERY DAY) 90 capsule 0  . prochlorperazine (COMPAZINE) 10 MG tablet Take 1 tablet (10 mg total) by mouth every 6 (six) hours as needed (Nausea or vomiting). 30 tablet 1  . sucralfate (CARAFATE) 1 g tablet Take 1 tablet (1 g total) by mouth 2 (two) times daily. 60 tablet 0   No current facility-administered medications for this visit.   Facility-Administered Medications Ordered in Other Visits  Medication Dose Route Frequency Provider Last Rate Last Admin  . heparin lock flush 100 unit/mL  500 Units Intravenous Once Earlie Server, MD        PHYSICAL EXAMINATION: ECOG PERFORMANCE STATUS: 0 - Asymptomatic  Vitals:   10/30/19 0834  BP: 115/76  Pulse: 91  Resp: 18  Temp: (!) 97.2 F (36.2 C)   Filed Weights   10/30/19 0834  Weight: 216 lb 3.2 oz (98.1 kg)   Physical Exam    Constitutional: She is oriented to person, place, and time. No distress.  HENT:  Head: Normocephalic and atraumatic.  Nose: Nose normal.  Mouth/Throat: Oropharynx is clear and moist. No oropharyngeal exudate.  Eyes: Pupils are equal, round, and reactive to light. EOM are normal. No scleral icterus.  Cardiovascular: Normal rate and regular rhythm.  No murmur heard. Pulmonary/Chest: Effort normal. No respiratory distress. She has no rales. She exhibits no tenderness.  Abdominal: Soft. She exhibits no distension. There is no abdominal tenderness.  Musculoskeletal:        General: No edema. Normal range of motion.     Cervical back: Normal range of motion and neck supple.  Neurological:  She is alert and oriented to person, place, and time. No cranial nerve deficit. She exhibits normal muscle tone. Coordination normal.  Skin: Skin is warm and dry. She is not diaphoretic. No erythema.  Thinning hair  Psychiatric: Affect normal.     LABORATORY DATA:  I have reviewed the data as listed CBC Latest Ref Rng & Units 10/30/2019 10/20/2019 10/13/2019  WBC 4.0 - 10.5 K/uL 6.3 8.3 12.8(H)  Hemoglobin 12.0 - 15.0 g/dL 10.7(L) 9.9(L) 10.3(L)  Hematocrit 36.0 - 46.0 % 34.0(L) 31.6(L) 33.0(L)  Platelets 150 - 400 K/uL 375 377 231     CMP Latest Ref Rng & Units 10/30/2019 10/20/2019 10/13/2019  Glucose 70 - 99 mg/dL 143(H) 163(H) 143(H)  BUN 6 - 20 mg/dL '9 11 8  ' Creatinine 0.44 - 1.00 mg/dL 0.72 0.70 0.68  Sodium 135 - 145 mmol/L 138 137 139  Potassium 3.5 - 5.1 mmol/L 3.5 3.4(L) 3.7  Chloride 98 - 111 mmol/L 104 104 104  CO2 22 - 32 mmol/L '27 24 24  ' Calcium 8.9 - 10.3 mg/dL 9.0 9.1 8.9  Total Protein 6.5 - 8.1 g/dL 7.4 7.0 7.0  Total Bilirubin 0.3 - 1.2 mg/dL 0.6 0.6 0.4  Alkaline Phos 38 - 126 U/L 85 81 103  AST 15 - 41 U/L '21 19 18  ' ALT 0 - 44 U/L '28 21 14      ' RADIOGRAPHIC STUDIES: I have personally reviewed the radiological images as listed and agreed with the findings in the  report. DG Chest 1 View  Result Date: 08/17/2019 CLINICAL DATA:  Post port placement, breast cancer EXAM: CHEST  1 VIEW COMPARISON:  01/20/2010 FINDINGS: Interval placement of LEFT-sided power port, tip overlying the level of superior vena cava. Shallow lung inflation. Heart size is normal. No pulmonary edema. No pneumothorax following the procedure. IMPRESSION: Interval placement of LEFT-sided power port. No adverse features. Electronically Signed   By: Nolon Nations M.D.   On: 08/17/2019 09:17   NM Cardiac Muga Rest  Result Date: 08/09/2019 CLINICAL DATA:  Invasive breast cancer, pre cardiotoxic chemotherapy EXAM: NUCLEAR MEDICINE CARDIAC BLOOD POOL IMAGING (MUGA) TECHNIQUE: Cardiac multi-gated acquisition was performed at rest following intravenous injection of Tc-20mlabeled red blood cells. RADIOPHARMACEUTICALS:  19.95 mCi Tc-922mertechnetate in-vitro labeled red blood cells IV COMPARISON:  None FINDINGS: Calculated LEFT ventricular ejection fraction is 59.5%, normal. Study was obtained at a cardiac rate of 79 bpm. Patient was rhythmic during imaging. Cine analysis of the LEFT ventricle in 3 projections demonstrates normal LV wall motion. IMPRESSION: Normal LEFT ventricular ejection fraction of 59.5%. Normal LV wall motion. Electronically Signed   By: MaLavonia Dana.D.   On: 08/09/2019 16:18   DG C-Arm 1-60 Min-No Report  Result Date: 08/17/2019 Fluoroscopy was utilized by the requesting physician.  No radiographic interpretation.   [employer]ore[employer] Limited Uni Right Inc Axilla  Result Date: 10/02/2019 CLINICAL DATA:  [age]ear old female with biopsy proven invasive mammary carcinoma of the right breast and metastatic axillary adenopathy. Assess response to neoadjuvant chemotherapy. EXAM: DIGITAL DIAGNOSTIC RIGHT MAMMOGRAM WITH CAD AND TOMO ULTRASOUND RIGHT BREAST COMPARISON:  Previous exam(s). ACR Breast Density Category b: There are scattered areas of fibroglandular density. FINDINGS: There is a  2.1 cm mass in the lower inner quadrant of the right breast. There is a heart shaped clip located in the mass. There is an enlarged right axillary lymph node with a HydroMARK clip. No additional mass is seen in the right breast. There are no malignant type microcalcifications.  Mammographic images were processed with CAD. Targeted ultrasound is performed, showing there is an irregular hypoechoic mass in the right breast at 5 o'clock 3 cm from the nipple measuring 2.8 x 1.6 x 2.8 cm. There is a solitary enlarged axillary lymph node measuring 3.3 x 1.5 cm (cortical thickening 1.5 cm). On the prior ultrasound dated 07/20/2019 the mass in the right breast at 5 o'clock 3 cm from the nipple measured 3.4 x 2.4 x 3.4 cm and the lymph node cortical thickening measured 1.6 cm. IMPRESSION: Interval reduction in the size of the mass in the 5 o'clock region of the right breast now measuring 2.8 cm versus 3.3 cm on the prior exam. The axillary lymph node is relatively unchanged. RECOMMENDATION: Continued treatment planning of the known right breast invasive mammary carcinoma and metastatic axillary adenopathy is recommended. I have discussed the findings and recommendations with the patient. If applicable, a reminder letter will be sent to the patient regarding the next appointment. BI-RADS CATEGORY  6: Known biopsy-proven malignancy. Electronically Signed   By: Lillia Mountain M.D.   On: 10/02/2019 15:29   MM DIAG BREAST TOMO UNI RIGHT  Result Date: 10/02/2019 CLINICAL DATA:  43 year old female with biopsy proven invasive mammary carcinoma of the right breast and metastatic axillary adenopathy. Assess response to neoadjuvant chemotherapy. EXAM: DIGITAL DIAGNOSTIC RIGHT MAMMOGRAM WITH CAD AND TOMO ULTRASOUND RIGHT BREAST COMPARISON:  Previous exam(s). ACR Breast Density Category b: There are scattered areas of fibroglandular density. FINDINGS: There is a 2.1 cm mass in the lower inner quadrant of the right breast. There is a  heart shaped clip located in the mass. There is an enlarged right axillary lymph node with a HydroMARK clip. No additional mass is seen in the right breast. There are no malignant type microcalcifications. Mammographic images were processed with CAD. Targeted ultrasound is performed, showing there is an irregular hypoechoic mass in the right breast at 5 o'clock 3 cm from the nipple measuring 2.8 x 1.6 x 2.8 cm. There is a solitary enlarged axillary lymph node measuring 3.3 x 1.5 cm (cortical thickening 1.5 cm). On the prior ultrasound dated 07/20/2019 the mass in the right breast at 5 o'clock 3 cm from the nipple measured 3.4 x 2.4 x 3.4 cm and the lymph node cortical thickening measured 1.6 cm. IMPRESSION: Interval reduction in the size of the mass in the 5 o'clock region of the right breast now measuring 2.8 cm versus 3.3 cm on the prior exam. The axillary lymph node is relatively unchanged. RECOMMENDATION: Continued treatment planning of the known right breast invasive mammary carcinoma and metastatic axillary adenopathy is recommended. I have discussed the findings and recommendations with the patient. If applicable, a reminder letter will be sent to the patient regarding the next appointment. BI-RADS CATEGORY  6: Known biopsy-proven malignancy. Electronically Signed   By: Lillia Mountain M.D.   On: 10/02/2019 15:29     ASSESSMENT & PLAN:  1. Invasive carcinoma of breast ([place])   2. Encounter for antineoplastic chemotherapy   3. Normocytic anemia   4. Sciatic nerve pain, unspecified laterality     Invasive right breast cancer,  Cancer Staging Malignant neoplasm of lower-inner quadrant of right breast of female, estrogen receptor positive ([place]) Staging form: Breast, AJCC 8th Edition - Clinical stage from 07/26/2019: Stage IIB (cT2, cN1, cM0, G3, ER+, PR+, HER2-) - Unsigned -Status post 4 cycles of dose dense Adriamycin and Cytoxan.   -Teleconference currently on weekly Taxol.  She has tolerated 2 cycles  well. Labs reviewed and discussed with patient. Counts are okay to proceed with cycle 3 Taxol Patient has questions about her future treatment plans and I discussed with her in details.  #Pre-existing sciatic pain, symptom is currently controlled with gabapentin 300 mg 3 times daily.  Continue current regimen.  #Anemia, multifactorial, from iron deficiency as well as chemotherapy.   Hemoglobin has improved.  Continue oral iron supplementation.  Hypokalemia, resolved. Follow-up in 1 week for assessment for next cycle of Taxol.  All questions were answered. The patient knows to call the clinic with any problems, questions or concerns. No barriers to learning was detected.     Earlie Server, MD 10/30/2019

## 2019-10-30 NOTE — Progress Notes (Signed)
Patient here for follow up. Pt states that sometimes she has sharp pains to right breast and where lymph nodes are.

## 2019-11-02 ENCOUNTER — Inpatient Hospital Stay (HOSPITAL_BASED_OUTPATIENT_CLINIC_OR_DEPARTMENT_OTHER): Payer: BC Managed Care – PPO | Admitting: Licensed Clinical Social Worker

## 2019-11-02 ENCOUNTER — Encounter: Payer: Self-pay | Admitting: Licensed Clinical Social Worker

## 2019-11-02 DIAGNOSIS — Z170 Estrogen receptor positive status [ER+]: Secondary | ICD-10-CM

## 2019-11-02 DIAGNOSIS — Z801 Family history of malignant neoplasm of trachea, bronchus and lung: Secondary | ICD-10-CM

## 2019-11-02 DIAGNOSIS — Z1379 Encounter for other screening for genetic and chromosomal anomalies: Secondary | ICD-10-CM

## 2019-11-02 DIAGNOSIS — C50311 Malignant neoplasm of lower-inner quadrant of right female breast: Secondary | ICD-10-CM

## 2019-11-02 DIAGNOSIS — Z8049 Family history of malignant neoplasm of other genital organs: Secondary | ICD-10-CM

## 2019-11-02 NOTE — Progress Notes (Signed)
REFERRING PROVIDER: Earlie Server, MD [place],  [place] [id number]  PRIMARY PROVIDER:  Ricardo Jericho, NP  PRIMARY REASON FOR VISIT:  1. Malignant neoplasm of lower-inner quadrant of right breast of female, estrogen receptor positive ([place])   2. Family history of cervical cancer   3. Family history of lung cancer     I connected with [name] on 11/02/2019 at 8:55 AM EDT by Jackquline Denmark and verified that I am speaking with the correct person using two identifiers.    Patient location: home Provider location: clinic  HISTORY OF PRESENT ILLNESS:   Jessica [name], a 43 y.o. female, was seen for a [facility] cancer genetics consultation at the request of Dr. Tasia Catchings due to a personal history of cancer.  Jessica [name] presents to clinic today to discuss the possibility of a hereditary predisposition to cancer, genetic testing, and to further clarify her future cancer risks, as well as potential cancer risks for family members.   In 2020, at the age of 20, [name] was diagnosed with invasive mammary carcinoma of the right breast, ER/PR+, Her2-. The treatment plan includes neoadjuvant chemotherapy, surgery, and radiation.   CANCER HISTORY:  Oncology History Overview Note  Cancer Staging Malignant neoplasm of lower-inner quadrant of right breast of female, estrogen receptor positive ([place]) Staging form: Breast, AJCC 8th Edition - Clinical stage from 07/26/2019: Stage IIB (cT2, cN1, cM0, G3, ER+, PR+, HER2-) - Unsigned    Malignant neoplasm of lower-inner quadrant of right breast of female, estrogen receptor positive ([place])  07/20/2019 Mammogram   Mammogram 07/20/19  IMPRESSION: Suspicious palpable right breast mass 3.4 x 2.5 x 3.4 cm 5 o'clock position 3 cm from nipple.   Suspicious 1.6cm cortically thickened right axillary lymph node.   07/26/2019 Initial Diagnosis   DIAGNOSIS: 07/26/19 A. RIGHT BREAST, 5:00, 3CMFN; ULTRASOUND-GUIDED NEEDLE CORE BIOPSY:  - INVASIVE  MAMMARY CARCINOMA, NO SPECIAL TYPE.    07/26/2019 Receptors her2   BREAST BIOMARKER TESTS  Estrogen Receptor (ER) Status: POSITIVE                       Percentage of cells with nuclear positivity: 51-90%                       Average intensity of staining: Strong   Progesterone Receptor (PgR) Status: POSITIVE                       Percentage of cells with nuclear positivity:  Greater than 90%                       Average intensity of staining: Strong   HER2 (by immunohistochemistry): NEGATIVE (Score 1+)    08/05/2019 Initial Diagnosis   Invasive carcinoma of breast ([place])   08/17/2019 Surgery   INSERTION PORT-A-CATH by Dr. Lysle Pearl 08/17/19    08/18/2019 -  Chemotherapy   AC q2weeks for 4 cycles starting 08/18/19 followed by weekly Taxol for 12 weeks       RISK FACTORS:  Menarche was at age 31.  First live birth at age 44.  OCP use for approximately 3 years.  Ovaries intact: yes.  Hysterectomy: no.  Menopausal status: premenopausal.  HRT use: 0 years. Colonoscopy: yes; normal. Mammogram within the last year: yes. Number of breast biopsies: 1. Up to date with pelvic exams: reports she is due for one. Any excessive radiation exposure in the  past: no  Past Medical History:  Diagnosis Date  . Breast cancer ([place])    right breast  . Cancer ([place])    breast cancer  . Family history of cervical cancer   . Family history of lung cancer   . Frequency   . Kidney stones   . Obesity   . Personal history of chemotherapy   . Right flank pain     Past Surgical History:  Procedure Laterality Date  . BREAST BIOPSY    . CESAREAN SECTION     x 2  . CHOLECYSTECTOMY    . DILATION AND CURETTAGE OF UTERUS    . PORTACATH PLACEMENT Left 08/17/2019   Procedure: INSERTION PORT-A-CATH;  Surgeon: Benjamine Sprague, DO;  Location: [facility] ORS;  Service: General;  Laterality: Left;  . TUBAL LIGATION      Social History   Socioeconomic History  . Marital status: Single    Spouse name: Not  on file  . Number of children: Not on file  . Years of education: Not on file  . Highest education level: Not on file  Occupational History  . Not on file  Tobacco Use  . Smoking status: Never Smoker  . Smokeless tobacco: Never Used  Substance and Sexual Activity  . Alcohol use: No  . Drug use: No  . Sexual activity: Yes  Other Topics Concern  . Not on file  Social History Narrative  . Not on file   Social Determinants of Health   Financial Resource Strain:   . Difficulty of Paying Living Expenses: Not on file  Food Insecurity:   . Worried About Charity fundraiser in the Last Year: Not on file  . Ran Out of Food in the Last Year: Not on file  Transportation Needs:   . Lack of Transportation (Medical): Not on file  . Lack of Transportation (Non-Medical): Not on file  Physical Activity:   . Days of Exercise per Week: Not on file  . Minutes of Exercise per Session: Not on file  Stress:   . Feeling of Stress : Not on file  Social Connections:   . Frequency of Communication with Friends and Family: Not on file  . Frequency of Social Gatherings with Friends and Family: Not on file  . Attends Religious Services: Not on file  . Active Member of Clubs or Organizations: Not on file  . Attends Archivist Meetings: Not on file  . Marital Status: Not on file     FAMILY HISTORY:  We obtained a detailed, 4-generation family history.  Significant diagnoses are listed below: Family History  Problem Relation Age of Onset  . Kidney Stones Other   . Cervical cancer Mother 63  . Kidney Stones Mother   . Other Sister   . Lung cancer Maternal Grandmother   . Breast cancer Neg Hx    Jessica [name] has a son, 60, and a daughter, 62, with no history of cancer. She has 1 sister, 78, no cancer.  Jessica [name] mother was diagnosed with cervical cancer in her 21s and is living at 19. Patient had 2 maternal uncles and 1 maternal aunt, no cancers. No known cancers in maternal  cousins. Maternal grandmother had lung cancer and history of smoking and passed at 30. Maternal grandfather is living and has not had cancer.  Jessica [name] father died at 80, no history of cancer. Patient had 3 paternal aunts and 1 paternal uncle, no cancers. No known cancers in paternal  cousins. Paternal grandparents are both deceased, no cancers.  Ms. Scifres is unaware of previous family history of genetic testing for hereditary cancer risks. Patient's maternal ancestors are of White descent, and paternal ancestors are of Black descent. There is no reported Ashkenazi Jewish ancestry. There is no known consanguinity.  GENETIC COUNSELING ASSESSMENT: Ms. Gasner is a 43 y.o. female with a personal history of breast cancer at 8 which is somewhat suggestive of a hereditary cancer syndrome and predisposition to cancer. We, therefore, discussed and recommended the following at today's visit.   DISCUSSION: We discussed that 5 - 10% of breast cancer is hereditary, with most cases associated with BRCA1/BRCA2 mutations.  There are other genes that can be associated with hereditary breast cancer syndromes. We discussed that testing is beneficial for several reasons including surgical decision-making for breast cancer, knowing how to follow individuals after completing their treatment, and understand if other family members could be at risk for cancer and allow them to undergo genetic testing.   We reviewed the characteristics, features and inheritance patterns of hereditary cancer syndromes. We also discussed genetic testing, including the appropriate family members to test, the process of testing, insurance coverage and turn-around-time for results. We discussed the implications of a negative, positive and/or variant of uncertain significant result. We recommended Ms. Staszewski pursue genetic testing for the [facility] Common Hereditary Cancers  gene panel.   The Common Hereditary Cancers Panel offered by  [facility] includes sequencing and/or deletion duplication testing of the following 48 genes: APC, ATM, AXIN2, BARD1, BMPR1A, BRCA1, BRCA2, BRIP1, CDH1, CDKN2A (p14ARF), CDKN2A (p16INK4a), CKD4, CHEK2, CTNNA1, DICER1, EPCAM (Deletion/duplication testing only), GREM1 (promoter region deletion/duplication testing only), KIT, MEN1, MLH1, MSH2, MSH3, MSH6, MUTYH, NBN, NF1, NHTL1, PALB2, PDGFRA, PMS2, POLD1, POLE, PTEN, RAD50, RAD51C, RAD51D, RNF43, SDHB, SDHC, SDHD, SMAD4, SMARCA4. STK11, TP53, TSC1, TSC2, and VHL.  The following genes were evaluated for sequence changes only: SDHA and HOXB13 c.251G>A variant only.  Based on Ms. Kasparek's personal and family history of cancer, she meets medical criteria for genetic testing. Despite that she meets criteria, she may still have an out of pocket cost.   PLAN: After considering the risks, benefits, and limitations, Ms. Mckenzie provided informed consent to pursue genetic testing. She will have her blood drawn on 11/05/2018 at 8:15 am and the blood sample will be sent to [facility] for analysis of the Common Hereditary Cancers Panel. Results should be available within approximately 2-3 weeks' time, at which point they will be disclosed by telephone to Ms. Arauz, as will any additional recommendations warranted by these results. Ms. Viglione will receive a summary of her genetic counseling visit and a copy of her results once available. This information will also be available in Epic.   Ms. Mariner questions were answered to her satisfaction today. Our contact information was provided should additional questions or concerns arise. Thank you for the referral and allowing [place] to share in the care of your patient.   Faith Rogue, MS, [facility] Genetic Counselor [email or web address] Phone: [phone]  The patient was seen for a total of 30 minutes in virtual genetic counseling.  Drs. Magrinat, Lindi Adie and/or Burr Medico were available for discussion  regarding this case.   _______________________________________________________________________ For Office Staff:  Number of people involved in session: 1 Was an Intern/ student involved with case: no

## 2019-11-06 ENCOUNTER — Encounter: Payer: Self-pay | Admitting: Oncology

## 2019-11-06 ENCOUNTER — Other Ambulatory Visit: Payer: Self-pay

## 2019-11-06 ENCOUNTER — Inpatient Hospital Stay: Payer: BC Managed Care – PPO

## 2019-11-06 ENCOUNTER — Other Ambulatory Visit: Payer: Self-pay | Admitting: Licensed Clinical Social Worker

## 2019-11-06 ENCOUNTER — Inpatient Hospital Stay (HOSPITAL_BASED_OUTPATIENT_CLINIC_OR_DEPARTMENT_OTHER): Payer: BC Managed Care – PPO | Admitting: Oncology

## 2019-11-06 ENCOUNTER — Inpatient Hospital Stay: Payer: BC Managed Care – PPO | Attending: Oncology

## 2019-11-06 VITALS — BP 122/81 | HR 89 | Temp 97.9°F | Resp 18 | Wt 218.7 lb

## 2019-11-06 DIAGNOSIS — D649 Anemia, unspecified: Secondary | ICD-10-CM

## 2019-11-06 DIAGNOSIS — Z7952 Long term (current) use of systemic steroids: Secondary | ICD-10-CM | POA: Diagnosis not present

## 2019-11-06 DIAGNOSIS — Z170 Estrogen receptor positive status [ER+]: Secondary | ICD-10-CM | POA: Diagnosis not present

## 2019-11-06 DIAGNOSIS — E876 Hypokalemia: Secondary | ICD-10-CM | POA: Insufficient documentation

## 2019-11-06 DIAGNOSIS — Z79899 Other long term (current) drug therapy: Secondary | ICD-10-CM | POA: Insufficient documentation

## 2019-11-06 DIAGNOSIS — Z7982 Long term (current) use of aspirin: Secondary | ICD-10-CM | POA: Diagnosis not present

## 2019-11-06 DIAGNOSIS — M5430 Sciatica, unspecified side: Secondary | ICD-10-CM

## 2019-11-06 DIAGNOSIS — C50311 Malignant neoplasm of lower-inner quadrant of right female breast: Secondary | ICD-10-CM

## 2019-11-06 DIAGNOSIS — Z5111 Encounter for antineoplastic chemotherapy: Secondary | ICD-10-CM

## 2019-11-06 DIAGNOSIS — Z95828 Presence of other vascular implants and grafts: Secondary | ICD-10-CM

## 2019-11-06 DIAGNOSIS — G4700 Insomnia, unspecified: Secondary | ICD-10-CM | POA: Insufficient documentation

## 2019-11-06 DIAGNOSIS — D509 Iron deficiency anemia, unspecified: Secondary | ICD-10-CM | POA: Insufficient documentation

## 2019-11-06 DIAGNOSIS — K219 Gastro-esophageal reflux disease without esophagitis: Secondary | ICD-10-CM | POA: Insufficient documentation

## 2019-11-06 DIAGNOSIS — G8929 Other chronic pain: Secondary | ICD-10-CM | POA: Diagnosis not present

## 2019-11-06 DIAGNOSIS — L989 Disorder of the skin and subcutaneous tissue, unspecified: Secondary | ICD-10-CM | POA: Insufficient documentation

## 2019-11-06 DIAGNOSIS — R5382 Chronic fatigue, unspecified: Secondary | ICD-10-CM | POA: Diagnosis not present

## 2019-11-06 DIAGNOSIS — C50919 Malignant neoplasm of unspecified site of unspecified female breast: Secondary | ICD-10-CM

## 2019-11-06 DIAGNOSIS — R519 Headache, unspecified: Secondary | ICD-10-CM | POA: Insufficient documentation

## 2019-11-06 LAB — CBC WITH DIFFERENTIAL/PLATELET
Abs Immature Granulocytes: 0.04 10*3/uL (ref 0.00–0.07)
Basophils Absolute: 0.1 10*3/uL (ref 0.0–0.1)
Basophils Relative: 1 %
Eosinophils Absolute: 0.5 10*3/uL (ref 0.0–0.5)
Eosinophils Relative: 8 %
HCT: 31.5 % — ABNORMAL LOW (ref 36.0–46.0)
Hemoglobin: 9.9 g/dL — ABNORMAL LOW (ref 12.0–15.0)
Immature Granulocytes: 1 %
Lymphocytes Relative: 20 %
Lymphs Abs: 1.4 10*3/uL (ref 0.7–4.0)
MCH: 28.4 pg (ref 26.0–34.0)
MCHC: 31.4 g/dL (ref 30.0–36.0)
MCV: 90.5 fL (ref 80.0–100.0)
Monocytes Absolute: 0.5 10*3/uL (ref 0.1–1.0)
Monocytes Relative: 7 %
Neutro Abs: 4.2 10*3/uL (ref 1.7–7.7)
Neutrophils Relative %: 63 %
Platelets: 335 10*3/uL (ref 150–400)
RBC: 3.48 MIL/uL — ABNORMAL LOW (ref 3.87–5.11)
RDW: 17.3 % — ABNORMAL HIGH (ref 11.5–15.5)
WBC: 6.7 10*3/uL (ref 4.0–10.5)
nRBC: 0 % (ref 0.0–0.2)

## 2019-11-06 LAB — COMPREHENSIVE METABOLIC PANEL
ALT: 23 U/L (ref 0–44)
AST: 20 U/L (ref 15–41)
Albumin: 3.8 g/dL (ref 3.5–5.0)
Alkaline Phosphatase: 83 U/L (ref 38–126)
Anion gap: 8 (ref 5–15)
BUN: 12 mg/dL (ref 6–20)
CO2: 25 mmol/L (ref 22–32)
Calcium: 8.8 mg/dL — ABNORMAL LOW (ref 8.9–10.3)
Chloride: 104 mmol/L (ref 98–111)
Creatinine, Ser: 0.75 mg/dL (ref 0.44–1.00)
GFR calc Af Amer: >60 mL/min (ref >60)
GFR calc non Af Amer: >60 mL/min (ref >60)
Glucose, Bld: 158 mg/dL — ABNORMAL HIGH (ref 70–99)
Potassium: 3.5 mmol/L (ref 3.5–5.1)
Sodium: 137 mmol/L (ref 135–145)
Total Bilirubin: 0.5 mg/dL (ref 0.3–1.2)
Total Protein: 6.8 g/dL (ref 6.5–8.1)

## 2019-11-06 LAB — MAGNESIUM: Magnesium: 1.9 mg/dL (ref 1.7–2.4)

## 2019-11-06 MED ORDER — DIPHENHYDRAMINE HCL 50 MG/ML IJ SOLN
50.0000 mg | Freq: Once | INTRAMUSCULAR | Status: AC
  Administered 2019-11-06: 50 mg via INTRAVENOUS
  Filled 2019-11-06: qty 1

## 2019-11-06 MED ORDER — SODIUM CHLORIDE 0.9 % IV SOLN
80.0000 mg/m2 | Freq: Once | INTRAVENOUS | Status: AC
  Administered 2019-11-06: 168 mg via INTRAVENOUS
  Filled 2019-11-06: qty 28

## 2019-11-06 MED ORDER — HEPARIN SOD (PORK) LOCK FLUSH 100 UNIT/ML IV SOLN
500.0000 [IU] | Freq: Once | INTRAVENOUS | Status: AC | PRN
  Administered 2019-11-06: 500 [IU]
  Filled 2019-11-06: qty 5

## 2019-11-06 MED ORDER — SODIUM CHLORIDE 0.9 % IV SOLN
20.0000 mg | Freq: Once | INTRAVENOUS | Status: AC
  Administered 2019-11-06: 20 mg via INTRAVENOUS
  Filled 2019-11-06: qty 20

## 2019-11-06 MED ORDER — FAMOTIDINE IN NACL 20-0.9 MG/50ML-% IV SOLN
20.0000 mg | Freq: Once | INTRAVENOUS | Status: AC
  Administered 2019-11-06: 20 mg via INTRAVENOUS
  Filled 2019-11-06: qty 50

## 2019-11-06 MED ORDER — SODIUM CHLORIDE 0.9 % IV SOLN
Freq: Once | INTRAVENOUS | Status: AC
  Filled 2019-11-06: qty 250

## 2019-11-06 MED ORDER — SODIUM CHLORIDE 0.9% FLUSH
10.0000 mL | Freq: Once | INTRAVENOUS | Status: AC
  Administered 2019-11-06: 10 mL via INTRAVENOUS
  Filled 2019-11-06: qty 10

## 2019-11-06 MED ORDER — HEPARIN SOD (PORK) LOCK FLUSH 100 UNIT/ML IV SOLN
INTRAVENOUS | Status: AC
  Filled 2019-11-06: qty 5

## 2019-11-06 NOTE — Progress Notes (Signed)
ge

## 2019-11-06 NOTE — Progress Notes (Signed)
[place]   [phone] [phone]) [phone]   Clinic Follow up Note   Patient Care Team: Ricardo Jericho, NP as PCP - General (Family Medicine) Rico Junker, RN as Registered Nurse  Date of Service:  11/06/2019  CHIEF COMPLAINT: F/u of right breast cancer   SUMMARY OF ONCOLOGIC HISTORY: Oncology History Overview Note  Cancer Staging Malignant neoplasm of lower-inner quadrant of right breast of female, estrogen receptor positive ([place]) Staging form: Breast, AJCC 8th Edition - Clinical stage from 07/26/2019: Stage IIB (cT2, cN1, cM0, G3, ER+, PR+, HER2-) - Unsigned    Malignant neoplasm of lower-inner quadrant of right breast of female, estrogen receptor positive ([place])  07/20/2019 Mammogram   Mammogram 07/20/19  IMPRESSION: Suspicious palpable right breast mass 3.4 x 2.5 x 3.4 cm 5 o'clock position 3 cm from nipple.   Suspicious 1.6cm cortically thickened right axillary lymph node.   07/26/2019 Initial Diagnosis   DIAGNOSIS: 07/26/19 A. RIGHT BREAST, 5:00, 3CMFN; ULTRASOUND-GUIDED NEEDLE CORE BIOPSY:  - INVASIVE MAMMARY CARCINOMA, NO SPECIAL TYPE.    07/26/2019 Receptors her2   BREAST BIOMARKER TESTS  Estrogen Receptor (ER) Status: POSITIVE                       Percentage of cells with nuclear positivity: 51-90%                       Average intensity of staining: Strong   Progesterone Receptor (PgR) Status: POSITIVE                       Percentage of cells with nuclear positivity:  Greater than 90%                       Average intensity of staining: Strong   HER2 (by immunohistochemistry): NEGATIVE (Score 1+)    08/05/2019 Initial Diagnosis   Invasive carcinoma of breast ([place])   08/17/2019 Surgery   INSERTION PORT-A-CATH by Dr. Lysle Pearl 08/17/19    08/18/2019 -  Chemotherapy   AC q2weeks for 4 cycles starting 08/18/19 followed by weekly Taxol for 12 weeks       CURRENT THERAPY:  Neoadjuvant chemo ddAC starting 08/18/19 for 4  cycles followed by weekly Taxol   INTERVAL HISTORY:  Jessica [name] presents to follow-up for evaluation prior to chemotherapy for breast cancer treatment. Patient has completed 4 cycles of dose dense Adriamycin and Cytoxan with scalp cooling system with Dr.Feng at [facility]. Currently on weekly Taxol. Overall she tolerates well.  She is being also using the scalp cooling system and tolerates well.She has experienced some hair loss. Bilateral sciatic pain has stay stable during the interval.  She takes gabapentin 300 mg 3 times daily. Denies any numbness or tingling of her fingertips or toes Chronic fatigue, unchanged a no nausea vomiting or diarrhea She has a small skin bump at the Mediport area, per RN, the skin bump is not open to air or oozing.  Patient has good blood return from Mediport.    Review of Systems  Constitutional: Positive for fatigue. Negative for appetite change, chills and fever.  HENT:   Negative for hearing loss and voice change.   Eyes: Negative for eye problems.  Respiratory: Negative for chest tightness and cough.   Cardiovascular: Negative for chest pain.  Gastrointestinal: Negative for abdominal distention, abdominal pain and blood in stool.  Endocrine: Negative for hot flashes.  Genitourinary: Negative for difficulty urinating and frequency.   Musculoskeletal: Negative for arthralgias.  Skin: Negative for itching and rash.  Neurological: Negative for extremity weakness.  Hematological: Negative for adenopathy.  Psychiatric/Behavioral: Negative for confusion.    MEDICAL HISTORY:  Past Medical History:  Diagnosis Date  . Breast cancer ([place])    right breast  . Cancer ([place])    breast cancer  . Family history of cervical cancer   . Family history of lung cancer   . Frequency   . Kidney stones   . Obesity   . Personal history of chemotherapy   . Right flank pain     SURGICAL HISTORY: Past Surgical History:  Procedure Laterality Date  . BREAST  BIOPSY    . CESAREAN SECTION     x 2  . CHOLECYSTECTOMY    . DILATION AND CURETTAGE OF UTERUS    . PORTACATH PLACEMENT Left 08/17/2019   Procedure: INSERTION PORT-A-CATH;  Surgeon: Benjamine Sprague, DO;  Location: [facility] ORS;  Service: General;  Laterality: Left;  . TUBAL LIGATION      I have reviewed the social history and family history with the patient and they are unchanged from previous note.  ALLERGIES:  has No Known Allergies.  MEDICATIONS:  Current Outpatient Medications  Medication Sig Dispense Refill  . Aspirin-Acetaminophen-Caffeine (GOODYS EXTRA STRENGTH PO) Take by mouth.    . ferrous sulfate 325 (65 FE) MG EC tablet TAKE 1 TABLET (325 MG TOTAL) BY MOUTH 2 (TWO) TIMES DAILY WITH A MEAL. 60 tablet 1  . gabapentin (NEURONTIN) 600 MG tablet Take 600 mg by mouth 3 (three) times daily.    Marland Kitchen lidocaine-prilocaine (EMLA) cream Apply to affected area once 30 g 3  . omeprazole (PRILOSEC) 20 MG capsule TAKE 1 CAPSULE BY MOUTH EVERY DAY (Patient taking differently: daily as needed. TAKE 1 CAPSULE BY MOUTH EVERY DAY) 90 capsule 0  . prochlorperazine (COMPAZINE) 10 MG tablet Take 1 tablet (10 mg total) by mouth every 6 (six) hours as needed (Nausea or vomiting). 30 tablet 1  . sucralfate (CARAFATE) 1 g tablet Take 1 tablet (1 g total) by mouth 2 (two) times daily. 60 tablet 0   No current facility-administered medications for this visit.   Facility-Administered Medications Ordered in Other Visits  Medication Dose Route Frequency Provider Last Rate Last Admin  . dexamethasone (DECADRON) 20 mg in sodium chloride 0.9 % 50 mL IVPB  20 mg Intravenous Once Earlie Server, MD 208 mL/hr at 11/06/19 1030 20 mg at 11/06/19 1030  . famotidine (PEPCID) IVPB 20 mg premix  20 mg Intravenous Once Earlie Server, MD 100 mL/hr at 11/06/19 1011 20 mg at 11/06/19 1011  . heparin lock flush 100 unit/mL  500 Units Intracatheter Once PRN Earlie Server, MD      . PACLitaxel (TAXOL) 168 mg in sodium chloride 0.9 % 250 mL chemo  infusion (</= 25m/m2)  80 mg/m2 (Treatment Plan Recorded) Intravenous Once YEarlie Server MD        PHYSICAL EXAMINATION: ECOG PERFORMANCE STATUS: 0 - Asymptomatic  Vitals:   11/06/19 0906  BP: 122/81  Pulse: 89  Resp: 18  Temp: 97.9 F (36.6 C)   Filed Weights   11/06/19 0906  Weight: 218 lb 11.2 oz (99.2 kg)   Physical Exam  Constitutional: She is oriented to person, place, and time. No distress.  HENT:  Head: Normocephalic and atraumatic.  Nose: Nose normal.  Mouth/Throat: Oropharynx is clear and moist. No oropharyngeal exudate.  Eyes: Pupils  are equal, round, and reactive to light. EOM are normal. No scleral icterus.  Cardiovascular: Normal rate and regular rhythm.  No murmur heard. Pulmonary/Chest: Effort normal. No respiratory distress. She has no rales. She exhibits no tenderness.  Abdominal: Soft. She exhibits no distension. There is no abdominal tenderness.  Musculoskeletal:        General: No edema. Normal range of motion.     Cervical back: Normal range of motion and neck supple.  Neurological: She is alert and oriented to person, place, and time. No cranial nerve deficit. She exhibits normal muscle tone. Coordination normal.  Skin: Skin is warm and dry. She is not diaphoretic. No erythema.  Thinning hair  Psychiatric: Affect normal.     LABORATORY DATA:  I have reviewed the data as listed CBC Latest Ref Rng & Units 11/06/2019 10/30/2019 10/20/2019  WBC 4.0 - 10.5 K/uL 6.7 6.3 8.3  Hemoglobin 12.0 - 15.0 g/dL 9.9(L) 10.7(L) 9.9(L)  Hematocrit 36.0 - 46.0 % 31.5(L) 34.0(L) 31.6(L)  Platelets 150 - 400 K/uL 335 375 377     CMP Latest Ref Rng & Units 11/06/2019 10/30/2019 10/20/2019  Glucose 70 - 99 mg/dL 158(H) 143(H) 163(H)  BUN 6 - 20 mg/dL _0 Creatinine 0.44 - 1.00 mg/dL 0.75 0.72 0.70  Sodium 135 - 145 mmol/L 137 138 137  Potassium 3.5 - 5.1 mmol/L 3.5 3.5 3.4(L)  Chloride 98 - 111 mmol/L 104 104 104  CO2 22 - 32 mmol/L _1 Calcium 8.9 - 10.3  mg/dL 8.8(L) 9.0 9.1  Total Protein 6.5 - 8.1 g/dL 6.8 7.4 7.0  Total Bilirubin 0.3 - 1.2 mg/dL 0.5 0.6 0.6  Alkaline Phos 38 - 126 U/L 83 85 81  AST 15 - 41 U/L _2 ALT 0 - 44 U/L _3 RADIOGRAPHIC STUDIES: I have personally reviewed the radiological images as listed and agreed with the findings in the report. DG Chest 1 View  Result Date: 08/17/2019 CLINICAL DATA:  Post port placement, breast cancer EXAM: CHEST  1 VIEW COMPARISON:  01/20/2010 FINDINGS: Interval placement of LEFT-sided power port, tip overlying the level of superior vena cava. Shallow lung inflation. Heart size is normal. No pulmonary edema. No pneumothorax following the procedure. IMPRESSION: Interval placement of LEFT-sided power port. No adverse features. Electronically Signed   By: Nolon Nations M.D.   On: 08/17/2019 09:17   NM Cardiac Muga Rest  Result Date: 08/09/2019 CLINICAL DATA:  Invasive breast cancer, pre cardiotoxic chemotherapy EXAM: NUCLEAR MEDICINE CARDIAC BLOOD POOL IMAGING (MUGA) TECHNIQUE: Cardiac multi-gated acquisition was performed at rest following intravenous injection of Tc-64mlabeled red blood cells. RADIOPHARMACEUTICALS:  19.95 mCi Tc-965mertechnetate in-vitro labeled red blood cells IV COMPARISON:  None FINDINGS: Calculated LEFT ventricular ejection fraction is 59.5%, normal. Study was obtained at a cardiac rate of 79 bpm. Patient was rhythmic during imaging. Cine analysis of the LEFT ventricle in 3 projections demonstrates normal LV wall motion. IMPRESSION: Normal LEFT ventricular ejection fraction of 59.5%. Normal LV wall motion. Electronically Signed   By: MaLavonia Dana.D.   On: 08/09/2019 16:18   DG C-Arm 1-60 Min-No Report  Result Date: 08/17/2019 Fluoroscopy was utilized by the requesting physician.  No radiographic interpretation.   USKoreareast Limited Uni Right Inc Axilla  Result Date: 10/02/2019 CLINICAL DATA:  [age]ear old female with biopsy proven invasive mammary  carcinoma of the right breast and metastatic axillary adenopathy. Assess response to neoadjuvant chemotherapy.  EXAM: DIGITAL DIAGNOSTIC RIGHT MAMMOGRAM WITH CAD AND TOMO ULTRASOUND RIGHT BREAST COMPARISON:  Previous exam(s). ACR Breast Density Category b: There are scattered areas of fibroglandular density. FINDINGS: There is a 2.1 cm mass in the lower inner quadrant of the right breast. There is a heart shaped clip located in the mass. There is an enlarged right axillary lymph node with a HydroMARK clip. No additional mass is seen in the right breast. There are no malignant type microcalcifications. Mammographic images were processed with CAD. Targeted ultrasound is performed, showing there is an irregular hypoechoic mass in the right breast at 5 o'clock 3 cm from the nipple measuring 2.8 x 1.6 x 2.8 cm. There is a solitary enlarged axillary lymph node measuring 3.3 x 1.5 cm (cortical thickening 1.5 cm). On the prior ultrasound dated 07/20/2019 the mass in the right breast at 5 o'clock 3 cm from the nipple measured 3.4 x 2.4 x 3.4 cm and the lymph node cortical thickening measured 1.6 cm. IMPRESSION: Interval reduction in the size of the mass in the 5 o'clock region of the right breast now measuring 2.8 cm versus 3.3 cm on the prior exam. The axillary lymph node is relatively unchanged. RECOMMENDATION: Continued treatment planning of the known right breast invasive mammary carcinoma and metastatic axillary adenopathy is recommended. I have discussed the findings and recommendations with the patient. If applicable, a reminder letter will be sent to the patient regarding the next appointment. BI-RADS CATEGORY  6: Known biopsy-proven malignancy. Electronically Signed   By: Lillia Mountain M.D.   On: 10/02/2019 15:29   MM DIAG BREAST TOMO UNI RIGHT  Result Date: 10/02/2019 CLINICAL DATA:  44 year old female with biopsy proven invasive mammary carcinoma of the right breast and metastatic axillary adenopathy. Assess  response to neoadjuvant chemotherapy. EXAM: DIGITAL DIAGNOSTIC RIGHT MAMMOGRAM WITH CAD AND TOMO ULTRASOUND RIGHT BREAST COMPARISON:  Previous exam(s). ACR Breast Density Category b: There are scattered areas of fibroglandular density. FINDINGS: There is a 2.1 cm mass in the lower inner quadrant of the right breast. There is a heart shaped clip located in the mass. There is an enlarged right axillary lymph node with a HydroMARK clip. No additional mass is seen in the right breast. There are no malignant type microcalcifications. Mammographic images were processed with CAD. Targeted ultrasound is performed, showing there is an irregular hypoechoic mass in the right breast at 5 o'clock 3 cm from the nipple measuring 2.8 x 1.6 x 2.8 cm. There is a solitary enlarged axillary lymph node measuring 3.3 x 1.5 cm (cortical thickening 1.5 cm). On the prior ultrasound dated 07/20/2019 the mass in the right breast at 5 o'clock 3 cm from the nipple measured 3.4 x 2.4 x 3.4 cm and the lymph node cortical thickening measured 1.6 cm. IMPRESSION: Interval reduction in the size of the mass in the 5 o'clock region of the right breast now measuring 2.8 cm versus 3.3 cm on the prior exam. The axillary lymph node is relatively unchanged. RECOMMENDATION: Continued treatment planning of the known right breast invasive mammary carcinoma and metastatic axillary adenopathy is recommended. I have discussed the findings and recommendations with the patient. If applicable, a reminder letter will be sent to the patient regarding the next appointment. BI-RADS CATEGORY  6: Known biopsy-proven malignancy. Electronically Signed   By: Lillia Mountain M.D.   On: 10/02/2019 15:29     ASSESSMENT & PLAN:  1. Invasive carcinoma of breast ([place])   2. Encounter for antineoplastic chemotherapy  3. Normocytic anemia   4. Sciatic nerve pain, unspecified laterality   5. Port-A-Cath in place     Invasive right breast cancer,  Cancer Staging Malignant  neoplasm of lower-inner quadrant of right breast of female, estrogen receptor positive ([place]) Staging form: Breast, AJCC 8th Edition - Clinical stage from 07/26/2019: Stage IIB (cT2, cN1, cM0, G3, ER+, PR+, HER2-) - Unsigned -Status post 4 cycles of dose dense Adriamycin and Cytoxan.   -Patient is currently on weekly Taxol with scalp cooling system. Clinically she tolerates well. Labs reviewed and discussed with patient. Counts are okay to proceed with today's Taxol treatment.  #Pre-existing sciatic pain, symptom is currently stable.  Continue gabapentin 300 mg 3 times daily #Anemia, multifactorial, from chemotherapy as well as iron deficiency.  Continue oral iron supplementation.  Heme Globin is stable.  #Port-A-Cath in place, with blood return.  Patient has a small skin bump without signs of infection.  Continue monitor.  Discussed with patient.  Follow-up in 1 week for assessment for next cycle of Taxol.  All questions were answered. The patient knows to call the clinic with any problems, questions or concerns. No barriers to learning was detected.     Earlie Server, MD 11/06/2019

## 2019-11-06 NOTE — Progress Notes (Signed)
MD made aware that there is a bump/pimple that is under the pt.'s port incision and the incision is also a little red. Pt expresses that this bump has been there for a couple of weeks and there is no discomfort or pain around the port. Blood return noted and flushes with ease. No new orders given at this time. Pt educated on the importance of notifying the clinic or medical team if her port incision or the area around her port shows any signs of infection or complications. Pt verbalized understanding and all question answered at this time.   Tierria Watson [employer]

## 2019-11-06 NOTE — Progress Notes (Signed)
Patient does not offer any problems today.  

## 2019-11-10 ENCOUNTER — Other Ambulatory Visit: Payer: Self-pay

## 2019-11-13 ENCOUNTER — Other Ambulatory Visit: Payer: Self-pay

## 2019-11-13 ENCOUNTER — Encounter: Payer: Self-pay | Admitting: Oncology

## 2019-11-13 ENCOUNTER — Inpatient Hospital Stay (HOSPITAL_BASED_OUTPATIENT_CLINIC_OR_DEPARTMENT_OTHER): Payer: BC Managed Care – PPO | Admitting: Oncology

## 2019-11-13 ENCOUNTER — Inpatient Hospital Stay: Payer: BC Managed Care – PPO

## 2019-11-13 VITALS — BP 114/79 | HR 84 | Temp 97.3°F | Resp 18 | Wt 219.2 lb

## 2019-11-13 DIAGNOSIS — E876 Hypokalemia: Secondary | ICD-10-CM

## 2019-11-13 DIAGNOSIS — C50919 Malignant neoplasm of unspecified site of unspecified female breast: Secondary | ICD-10-CM

## 2019-11-13 DIAGNOSIS — D649 Anemia, unspecified: Secondary | ICD-10-CM

## 2019-11-13 DIAGNOSIS — C50311 Malignant neoplasm of lower-inner quadrant of right female breast: Secondary | ICD-10-CM | POA: Diagnosis not present

## 2019-11-13 DIAGNOSIS — Z5111 Encounter for antineoplastic chemotherapy: Secondary | ICD-10-CM | POA: Diagnosis not present

## 2019-11-13 DIAGNOSIS — Z170 Estrogen receptor positive status [ER+]: Secondary | ICD-10-CM

## 2019-11-13 DIAGNOSIS — M5430 Sciatica, unspecified side: Secondary | ICD-10-CM

## 2019-11-13 LAB — COMPREHENSIVE METABOLIC PANEL
ALT: 21 U/L (ref 0–44)
AST: 19 U/L (ref 15–41)
Albumin: 3.7 g/dL (ref 3.5–5.0)
Alkaline Phosphatase: 92 U/L (ref 38–126)
Anion gap: 10 (ref 5–15)
BUN: 9 mg/dL (ref 6–20)
CO2: 24 mmol/L (ref 22–32)
Calcium: 8.9 mg/dL (ref 8.9–10.3)
Chloride: 103 mmol/L (ref 98–111)
Creatinine, Ser: 0.73 mg/dL (ref 0.44–1.00)
GFR calc Af Amer: >60 mL/min (ref >60)
GFR calc non Af Amer: >60 mL/min (ref >60)
Glucose, Bld: 167 mg/dL — ABNORMAL HIGH (ref 70–99)
Potassium: 3.4 mmol/L — ABNORMAL LOW (ref 3.5–5.1)
Sodium: 137 mmol/L (ref 135–145)
Total Bilirubin: 0.5 mg/dL (ref 0.3–1.2)
Total Protein: 6.9 g/dL (ref 6.5–8.1)

## 2019-11-13 LAB — CBC WITH DIFFERENTIAL/PLATELET
Abs Immature Granulocytes: 0.03 10*3/uL (ref 0.00–0.07)
Basophils Absolute: 0.1 10*3/uL (ref 0.0–0.1)
Basophils Relative: 1 %
Eosinophils Absolute: 0.4 10*3/uL (ref 0.0–0.5)
Eosinophils Relative: 7 %
HCT: 32.7 % — ABNORMAL LOW (ref 36.0–46.0)
Hemoglobin: 10.2 g/dL — ABNORMAL LOW (ref 12.0–15.0)
Immature Granulocytes: 1 %
Lymphocytes Relative: 20 %
Lymphs Abs: 1.2 10*3/uL (ref 0.7–4.0)
MCH: 28.6 pg (ref 26.0–34.0)
MCHC: 31.2 g/dL (ref 30.0–36.0)
MCV: 91.6 fL (ref 80.0–100.0)
Monocytes Absolute: 0.3 10*3/uL (ref 0.1–1.0)
Monocytes Relative: 5 %
Neutro Abs: 4.1 10*3/uL (ref 1.7–7.7)
Neutrophils Relative %: 66 %
Platelets: 320 10*3/uL (ref 150–400)
RBC: 3.57 MIL/uL — ABNORMAL LOW (ref 3.87–5.11)
RDW: 16.9 % — ABNORMAL HIGH (ref 11.5–15.5)
WBC: 6.2 10*3/uL (ref 4.0–10.5)
nRBC: 0 % (ref 0.0–0.2)

## 2019-11-13 LAB — MAGNESIUM: Magnesium: 1.9 mg/dL (ref 1.7–2.4)

## 2019-11-13 MED ORDER — FAMOTIDINE IN NACL 20-0.9 MG/50ML-% IV SOLN
20.0000 mg | Freq: Once | INTRAVENOUS | Status: AC
  Administered 2019-11-13: 20 mg via INTRAVENOUS
  Filled 2019-11-13: qty 50

## 2019-11-13 MED ORDER — HEPARIN SOD (PORK) LOCK FLUSH 100 UNIT/ML IV SOLN
500.0000 [IU] | Freq: Once | INTRAVENOUS | Status: AC | PRN
  Administered 2019-11-13: 500 [IU]
  Filled 2019-11-13: qty 5

## 2019-11-13 MED ORDER — SODIUM CHLORIDE 0.9 % IV SOLN
Freq: Once | INTRAVENOUS | Status: AC
  Filled 2019-11-13: qty 250

## 2019-11-13 MED ORDER — MELATONIN 5 MG PO CAPS: 5.0000 mg | ORAL_CAPSULE | Freq: Every evening | ORAL | 0 refills | Status: DC | PRN

## 2019-11-13 MED ORDER — SODIUM CHLORIDE 0.9 % IV SOLN
20.0000 mg | Freq: Once | INTRAVENOUS | Status: AC
  Administered 2019-11-13: 20 mg via INTRAVENOUS
  Filled 2019-11-13: qty 20

## 2019-11-13 MED ORDER — SODIUM CHLORIDE 0.9 % IV SOLN
80.0000 mg/m2 | Freq: Once | INTRAVENOUS | Status: AC
  Administered 2019-11-13: 168 mg via INTRAVENOUS
  Filled 2019-11-13: qty 28

## 2019-11-13 MED ORDER — DIPHENHYDRAMINE HCL 50 MG/ML IJ SOLN
50.0000 mg | Freq: Once | INTRAMUSCULAR | Status: AC
  Administered 2019-11-13: 50 mg via INTRAVENOUS
  Filled 2019-11-13: qty 1

## 2019-11-13 MED ORDER — SODIUM CHLORIDE 0.9% FLUSH
10.0000 mL | Freq: Once | INTRAVENOUS | Status: AC
  Administered 2019-11-13: 10 mL via INTRAVENOUS
  Filled 2019-11-13: qty 10

## 2019-11-13 MED ORDER — HEPARIN SOD (PORK) LOCK FLUSH 100 UNIT/ML IV SOLN
INTRAVENOUS | Status: AC
  Filled 2019-11-13: qty 5

## 2019-11-13 NOTE — Progress Notes (Signed)
Patient reports the Bendaryl given as pre-med makes her legs itch and the steroid gives her insomnia the night of tx.

## 2019-11-13 NOTE — Progress Notes (Signed)
[place]   [phone] Fax:([phone]   Clinic Follow up Note   Patient Care Team: Ricardo Jericho, NP as PCP - General (Family Medicine) Rico Junker, RN as Registered Nurse  Date of Service:  11/13/2019  CHIEF COMPLAINT: F/u of right breast cancer   SUMMARY OF ONCOLOGIC HISTORY: Oncology History Overview Note  Cancer Staging Malignant neoplasm of lower-inner quadrant of right breast of female, estrogen receptor positive ([place]) Staging form: Breast, AJCC 8th Edition - Clinical stage from 07/26/2019: Stage IIB (cT2, cN1, cM0, G3, ER+, PR+, HER2-) - Unsigned    Malignant neoplasm of lower-inner quadrant of right breast of female, estrogen receptor positive ([place])  07/20/2019 Mammogram   Mammogram 07/20/19  IMPRESSION: Suspicious palpable right breast mass 3.4 x 2.5 x 3.4 cm 5 o'clock position 3 cm from nipple.   Suspicious 1.6cm cortically thickened right axillary lymph node.   07/26/2019 Initial Diagnosis   DIAGNOSIS: 07/26/19 A. RIGHT BREAST, 5:00, 3CMFN; ULTRASOUND-GUIDED NEEDLE CORE BIOPSY:  - INVASIVE MAMMARY CARCINOMA, NO SPECIAL TYPE.    07/26/2019 Receptors her2   BREAST BIOMARKER TESTS  Estrogen Receptor (ER) Status: POSITIVE                       Percentage of cells with nuclear positivity: 51-90%                       Average intensity of staining: Strong   Progesterone Receptor (PgR) Status: POSITIVE                       Percentage of cells with nuclear positivity:  Greater than 90%                       Average intensity of staining: Strong   HER2 (by immunohistochemistry): NEGATIVE (Score 1+)    08/05/2019 Initial Diagnosis   Invasive carcinoma of breast ([place])   08/17/2019 Surgery   INSERTION PORT-A-CATH by Dr. Lysle Pearl 08/17/19    08/18/2019 -  Chemotherapy   AC q2weeks for 4 cycles starting 08/18/19 followed by weekly Taxol for 12 weeks       CURRENT THERAPY:  Neoadjuvant chemo ddAC starting 08/18/19 for 4  cycles followed by weekly Taxol   INTERVAL HISTORY:  Leonette Nutting presents to follow-up for evaluation prior to chemotherapy for breast cancer treatment. Patient has completed 4 cycles of dose dense Adriamycin and Cytoxan with scalp cooling system with Dr[facility]Feng [facility]. Currently on weekly Taxol. She is being also using the scalp cooling system and tolerates well.  She has mild hair loss. Patient reports that bilateral sciatic pain has stayed stable.  She takes gabapentin 300 mg 3 times daily. Minimal  numbness or tingling. Chronic fatigue, unchanged from previous. No nausea vomiting diarrhea. She reports having issue with falling to sleep on the day of chemotherapy, contributing to getting steroid Also Benadryl was given as premedication which makes her leg itchy.  Patient is spontaneously resolved after chemo    Review of Systems  Constitutional: Positive for fatigue. Negative for appetite change, chills and fever.  HENT:   Negative for hearing loss and voice change.   Eyes: Negative for eye problems.  Respiratory: Negative for chest tightness and cough.   Cardiovascular: Negative for chest pain.  Gastrointestinal: Negative for abdominal distention, abdominal pain and blood in stool.  Endocrine: Negative for hot flashes.  Genitourinary: Negative for difficulty  urinating and frequency.   Musculoskeletal: Negative for arthralgias.  Skin: Negative for itching and rash.  Neurological: Negative for extremity weakness.  Hematological: Negative for adenopathy.  Psychiatric/Behavioral: Negative for confusion.    MEDICAL HISTORY:  Past Medical History:  Diagnosis Date  . Breast cancer ([place])    right breast  . Cancer ([place])    breast cancer  . Family history of cervical cancer   . Family history of lung cancer   . Frequency   . Kidney stones   . Obesity   . Personal history of chemotherapy   . Right flank pain     SURGICAL HISTORY: Past Surgical History:  Procedure  Laterality Date  . BREAST BIOPSY    . CESAREAN SECTION     x 2  . CHOLECYSTECTOMY    . DILATION AND CURETTAGE OF UTERUS    . PORTACATH PLACEMENT Left 08/17/2019   Procedure: INSERTION PORT-A-CATH;  Surgeon: Benjamine Sprague, DO;  Location: [facility] ORS;  Service: General;  Laterality: Left;  . TUBAL LIGATION      I have reviewed the social history and family history with the patient and they are unchanged from previous note.  ALLERGIES:  has No Known Allergies.  MEDICATIONS:  Current Outpatient Medications  Medication Sig Dispense Refill  . Aspirin-Acetaminophen-Caffeine (GOODYS EXTRA STRENGTH PO) Take by mouth.    . gabapentin (NEURONTIN) 600 MG tablet Take 600 mg by mouth 3 (three) times daily.    Marland Kitchen lidocaine-prilocaine (EMLA) cream Apply to affected area once 30 g 3  . omeprazole (PRILOSEC) 20 MG capsule TAKE 1 CAPSULE BY MOUTH EVERY DAY (Patient taking differently: daily as needed. TAKE 1 CAPSULE BY MOUTH EVERY DAY) 90 capsule 0  . prochlorperazine (COMPAZINE) 10 MG tablet Take 1 tablet (10 mg total) by mouth every 6 (six) hours as needed (Nausea or vomiting). 30 tablet 1  . ferrous sulfate 325 (65 FE) MG EC tablet TAKE 1 TABLET (325 MG TOTAL) BY MOUTH 2 (TWO) TIMES DAILY WITH A MEAL. (Patient not taking: Reported on 11/13/2019) 60 tablet 1  . sucralfate (CARAFATE) 1 g tablet Take 1 tablet (1 g total) by mouth 2 (two) times daily. (Patient not taking: Reported on 11/13/2019) 60 tablet 0   No current facility-administered medications for this visit.    PHYSICAL EXAMINATION: ECOG PERFORMANCE STATUS: 0 - Asymptomatic  Vitals:   11/13/19 0838  BP: 114/79  Pulse: 84  Resp: 18  Temp: (!) 97.3 F (36.3 C)   Filed Weights   11/13/19 0838  Weight: 219 lb 3.2 oz (99.4 kg)   Physical Exam  Constitutional: She is oriented to person, place, and time. No distress.  HENT:  Head: Normocephalic and atraumatic.  Nose: Nose normal.  Mouth/Throat: Oropharynx is clear and moist. No  oropharyngeal exudate.  Eyes: Pupils are equal, round, and reactive to light. EOM are normal. No scleral icterus.  Cardiovascular: Normal rate and regular rhythm.  No murmur heard. Pulmonary/Chest: Effort normal. No respiratory distress. She has no rales. She exhibits no tenderness.  Abdominal: Soft. She exhibits no distension. There is no abdominal tenderness.  Musculoskeletal:        General: No edema. Normal range of motion.     Cervical back: Normal range of motion and neck supple.  Neurological: She is alert and oriented to person, place, and time. No cranial nerve deficit. She exhibits normal muscle tone. Coordination normal.  Skin: Skin is warm and dry. She is not diaphoretic. No erythema.  Thinning hair  Psychiatric: Affect normal.     LABORATORY DATA:  I have reviewed the data as listed CBC Latest Ref Rng & Units 11/13/2019 11/06/2019 10/30/2019  WBC 4.0 - 10.5 K/uL 6.2 6.7 6.3  Hemoglobin 12.0 - 15.0 g/dL 10.2(L) 9.9(L) 10.7(L)  Hematocrit 36.0 - 46.0 % 32.7(L) 31.5(L) 34.0(L)  Platelets 150 - 400 K/uL 320 335 375     CMP Latest Ref Rng & Units 11/13/2019 11/06/2019 10/30/2019  Glucose 70 - 99 mg/dL 167(H) 158(H) 143(H)  BUN 6 - 20 mg/dL _0 Creatinine 0.44 - 1.00 mg/dL 0.73 0.75 0.72  Sodium 135 - 145 mmol/L 137 137 138  Potassium 3.5 - 5.1 mmol/L 3.4(L) 3.5 3.5  Chloride 98 - 111 mmol/L 103 104 104  CO2 22 - 32 mmol/L _1 Calcium 8.9 - 10.3 mg/dL 8.9 8.8(L) 9.0  Total Protein 6.5 - 8.1 g/dL 6.9 6.8 7.4  Total Bilirubin 0.3 - 1.2 mg/dL 0.5 0.5 0.6  Alkaline Phos 38 - 126 U/L 92 83 85  AST 15 - 41 U/L _2 ALT 0 - 44 U/L _3 RADIOGRAPHIC STUDIES: I have personally reviewed the radiological images as listed and agreed with the findings in the report. DG Chest 1 View  Result Date: 08/17/2019 CLINICAL DATA:  Post port placement, breast cancer EXAM: CHEST  1 VIEW COMPARISON:  01/20/2010 FINDINGS: Interval placement of LEFT-sided power port,  tip overlying the level of superior vena cava. Shallow lung inflation. Heart size is normal. No pulmonary edema. No pneumothorax following the procedure. IMPRESSION: Interval placement of LEFT-sided power port. No adverse features. Electronically Signed   By: Nolon Nations M.D.   On: 08/17/2019 09:17   DG C-Arm 1-60 Min-No Report  Result Date: 08/17/2019 Fluoroscopy was utilized by the requesting physician.  No radiographic interpretation.   US Breast Limited Uni Right Inc Axilla  Result Date: 10/02/2019 CLINICAL DATA:  44 year old female with biopsy proven invasive mammary carcinoma of the right breast and metastatic axillary adenopathy. Assess response to neoadjuvant chemotherapy. EXAM: DIGITAL DIAGNOSTIC RIGHT MAMMOGRAM WITH CAD AND TOMO ULTRASOUND RIGHT BREAST COMPARISON:  Previous exam(s). ACR Breast Density Category b: There are scattered areas of fibroglandular density. FINDINGS: There is a 2.1 cm mass in the lower inner quadrant of the right breast. There is a heart shaped clip located in the mass. There is an enlarged right axillary lymph node with a HydroMARK clip. No additional mass is seen in the right breast. There are no malignant type microcalcifications. Mammographic images were processed with CAD. Targeted ultrasound is performed, showing there is an irregular hypoechoic mass in the right breast at 5 o'clock 3 cm from the nipple measuring 2.8 x 1.6 x 2.8 cm. There is a solitary enlarged axillary lymph node measuring 3.3 x 1.5 cm (cortical thickening 1.5 cm). On the prior ultrasound dated 07/20/2019 the mass in the right breast at 5 o'clock 3 cm from the nipple measured 3.4 x 2.4 x 3.4 cm and the lymph node cortical thickening measured 1.6 cm. IMPRESSION: Interval reduction in the size of the mass in the 5 o'clock region of the right breast now measuring 2.8 cm versus 3.3 cm on the prior exam. The axillary lymph node is relatively unchanged. RECOMMENDATION: Continued treatment planning  of the known right breast invasive mammary carcinoma and metastatic axillary adenopathy is recommended. I have discussed the findings and recommendations with the patient. If applicable, a reminder letter will be sent  to the patient regarding the next appointment. BI-RADS CATEGORY  6: Known biopsy-proven malignancy. Electronically Signed   By: Lillia Mountain M.D.   On: 10/02/2019 15:29   MM DIAG BREAST TOMO UNI RIGHT  Result Date: 10/02/2019 CLINICAL DATA:  44 year old female with biopsy proven invasive mammary carcinoma of the right breast and metastatic axillary adenopathy. Assess response to neoadjuvant chemotherapy. EXAM: DIGITAL DIAGNOSTIC RIGHT MAMMOGRAM WITH CAD AND TOMO ULTRASOUND RIGHT BREAST COMPARISON:  Previous exam(s). ACR Breast Density Category b: There are scattered areas of fibroglandular density. FINDINGS: There is a 2.1 cm mass in the lower inner quadrant of the right breast. There is a heart shaped clip located in the mass. There is an enlarged right axillary lymph node with a HydroMARK clip. No additional mass is seen in the right breast. There are no malignant type microcalcifications. Mammographic images were processed with CAD. Targeted ultrasound is performed, showing there is an irregular hypoechoic mass in the right breast at 5 o'clock 3 cm from the nipple measuring 2.8 x 1.6 x 2.8 cm. There is a solitary enlarged axillary lymph node measuring 3.3 x 1.5 cm (cortical thickening 1.5 cm). On the prior ultrasound dated 07/20/2019 the mass in the right breast at 5 o'clock 3 cm from the nipple measured 3.4 x 2.4 x 3.4 cm and the lymph node cortical thickening measured 1.6 cm. IMPRESSION: Interval reduction in the size of the mass in the 5 o'clock region of the right breast now measuring 2.8 cm versus 3.3 cm on the prior exam. The axillary lymph node is relatively unchanged. RECOMMENDATION: Continued treatment planning of the known right breast invasive mammary carcinoma and metastatic axillary  adenopathy is recommended. I have discussed the findings and recommendations with the patient. If applicable, a reminder letter will be sent to the patient regarding the next appointment. BI-RADS CATEGORY  6: Known biopsy-proven malignancy. Electronically Signed   By: Lillia Mountain M.D.   On: 10/02/2019 15:29     ASSESSMENT & PLAN:  1. Invasive carcinoma of breast ([place])   2. Encounter for antineoplastic chemotherapy   3. Malignant neoplasm of lower-inner quadrant of right breast of female, estrogen receptor positive ([place])   4. Hypokalemia   5. Sciatic nerve pain, unspecified laterality   6. Normocytic anemia     Invasive right breast cancer,  Cancer Staging Malignant neoplasm of lower-inner quadrant of right breast of female, estrogen receptor positive ([place]) Staging form: Breast, AJCC 8th Edition - Clinical stage from 07/26/2019: Stage IIB (cT2, cN1, cM0, G3, ER+, PR+, HER2-) - Unsigned -Status post 4 cycles of dose dense Adriamycin and Cytoxan.   -Patient is currently on weekly Taxol with scalp cooling system. Clinically doing well.  Labs reviewed and discussed with patient. Her counts are acceptable to proceed with today's Taxol treatment.  #Pre-existing sciatic pain, continue gabapentin 300 mg 3 times daily. 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  Continue to monitor.  #Anemia, multifactorial, from chemotherapy as well as iron deficiency.   Continue iron supplementation.  Hemoglobin has been stable at 10.2 today.  #Mild hypokalemia with potassium level 3.4.  We discussed about starting potassium chloride 10 mEq daily.  Patient prefers to try to take potassium enriched food which I think is also reasonable.  Advised patient to take 2 bananas per day we will repeat iron level at the next visit.  #Insomnia, likely secondary to chemotherapy/steroids.  Recommend patient to try melatonin 5 mg nightly as needed.  Follow-up in 1 week for  assessment for next  cycle of Taxol.  All questions were answered. The patient knows to call the clinic with any problems, questions or concerns. No barriers to learning was detected.     Earlie Server, MD 11/13/2019

## 2019-11-14 ENCOUNTER — Ambulatory Visit: Payer: Self-pay | Admitting: Licensed Clinical Social Worker

## 2019-11-14 ENCOUNTER — Encounter: Payer: Self-pay | Admitting: Licensed Clinical Social Worker

## 2019-11-14 ENCOUNTER — Telehealth: Payer: Self-pay | Admitting: Licensed Clinical Social Worker

## 2019-11-14 DIAGNOSIS — Z170 Estrogen receptor positive status [ER+]: Secondary | ICD-10-CM

## 2019-11-14 DIAGNOSIS — Z1379 Encounter for other screening for genetic and chromosomal anomalies: Secondary | ICD-10-CM | POA: Insufficient documentation

## 2019-11-14 DIAGNOSIS — Z8049 Family history of malignant neoplasm of other genital organs: Secondary | ICD-10-CM

## 2019-11-14 DIAGNOSIS — Z801 Family history of malignant neoplasm of trachea, bronchus and lung: Secondary | ICD-10-CM

## 2019-11-14 DIAGNOSIS — C50311 Malignant neoplasm of lower-inner quadrant of right female breast: Secondary | ICD-10-CM

## 2019-11-14 NOTE — Progress Notes (Signed)
HPI:  Jessica [name] was previously seen in the [place] clinic due to a personal and family history of cancer and concerns regarding a hereditary predisposition to cancer. Please refer to our prior cancer genetics clinic note for more information regarding our discussion, assessment and recommendations, at the time. Jessica [name] recent genetic test results were disclosed to her, as were recommendations warranted by these results. These results and recommendations are discussed in more detail below.  CANCER HISTORY:  Oncology History Overview Note  Cancer Staging Malignant neoplasm of lower-inner quadrant of right breast of female, estrogen receptor positive ([place]) Staging form: Breast, AJCC 8th Edition - Clinical stage from 07/26/2019: Stage IIB (cT2, cN1, cM0, G3, ER+, PR+, HER2-) - Unsigned    Malignant neoplasm of lower-inner quadrant of right breast of female, estrogen receptor positive ([place])  07/20/2019 Mammogram   Mammogram 07/20/19  IMPRESSION: Suspicious palpable right breast mass 3.4 x 2.5 x 3.4 cm 5 o'clock position 3 cm from nipple.   Suspicious 1.6cm cortically thickened right axillary lymph node.   07/26/2019 Initial Diagnosis   DIAGNOSIS: 07/26/19 A. RIGHT BREAST, 5:00, 3CMFN; ULTRASOUND-GUIDED NEEDLE CORE BIOPSY:  - INVASIVE MAMMARY CARCINOMA, NO SPECIAL TYPE.    07/26/2019 Receptors her2   BREAST BIOMARKER TESTS  Estrogen Receptor (ER) Status: POSITIVE                       Percentage of cells with nuclear positivity: 51-90%                       Average intensity of staining: Strong   Progesterone Receptor (PgR) Status: POSITIVE                       Percentage of cells with nuclear positivity:  Greater than 90%                       Average intensity of staining: Strong   HER2 (by immunohistochemistry): NEGATIVE (Score 1+)    08/05/2019 Initial Diagnosis   Invasive carcinoma of breast ([place])   08/17/2019 Surgery   INSERTION PORT-A-CATH by Dr.  Lysle Pearl 08/17/19    08/18/2019 -  Chemotherapy   AC q2weeks for 4 cycles starting 08/18/19 followed by weekly Taxol for 12 weeks     Genetic Testing   Negative genetic testing. No pathogenic variants identified on the [facility] Common Hereditary Cancers Panel. The report date is 11/13/2019.   The Common Hereditary Cancers Panel offered by [facility] includes sequencing and/or deletion duplication testing of the following 48 genes: APC, ATM, AXIN2, BARD1, BMPR1A, BRCA1, BRCA2, BRIP1, CDH1, CDKN2A (p14ARF), CDKN2A (p16INK4a), CKD4, CHEK2, CTNNA1, DICER1, EPCAM (Deletion/duplication testing only), GREM1 (promoter region deletion/duplication testing only), KIT, MEN1, MLH1, MSH2, MSH3, MSH6, MUTYH, NBN, NF1, NHTL1, PALB2, PDGFRA, PMS2, POLD1, POLE, PTEN, RAD50, RAD51C, RAD51D, RNF43, SDHB, SDHC, SDHD, SMAD4, SMARCA4. STK11, TP53, TSC1, TSC2, and VHL.  The following genes were evaluated for sequence changes only: SDHA and HOXB13 c.251G>A variant only.     FAMILY HISTORY:  We obtained a detailed, 4-generation family history.  Significant diagnoses are listed below: Family History  Problem Relation Age of Onset  . Kidney Stones Other   . Cervical cancer Mother 53  . Kidney Stones Mother   . Other Sister   . Lung cancer Maternal Grandmother   . Breast cancer Neg Hx     Jessica [name] has a son, 5, and a  daughter, 79, with no history of cancer. She has 1 sister, 54, no cancer.  Jessica [name] mother was diagnosed with cervical cancer in her 4s and is living at 68. Patient had 2 maternal uncles and 1 maternal aunt, no cancers. No known cancers in maternal cousins. Maternal grandmother had lung cancer and history of smoking and passed at 75. Maternal grandfather is living and has not had cancer.  Jessica [name] father died at 15, no history of cancer. Patient had 3 paternal aunts and 1 paternal uncle, no cancers. No known cancers in paternal cousins. Paternal grandparents are both deceased, no  cancers.  Jessica [name] is unaware of previous family history of genetic testing for hereditary cancer risks. Patient's maternal ancestors are of White descent, and paternal ancestors are of Black descent. There is no reported Ashkenazi Jewish ancestry. There is no known consanguinity.  GENETIC TEST RESULTS: Genetic testing reported out on 11/13/2019 through the [facility] Common Hereditary cancer panel found no pathogenic mutations. The Common Hereditary Cancers Panel offered by [facility] includes sequencing and/or deletion duplication testing of the following 48 genes: APC, ATM, AXIN2, BARD1, BMPR1A, BRCA1, BRCA2, BRIP1, CDH1, CDKN2A (p14ARF), CDKN2A (p16INK4a), CKD4, CHEK2, CTNNA1, DICER1, EPCAM (Deletion/duplication testing only), GREM1 (promoter region deletion/duplication testing only), KIT, MEN1, MLH1, MSH2, MSH3, MSH6, MUTYH, NBN, NF1, NHTL1, PALB2, PDGFRA, PMS2, POLD1, POLE, PTEN, RAD50, RAD51C, RAD51D, RNF43, SDHB, SDHC, SDHD, SMAD4, SMARCA4. STK11, TP53, TSC1, TSC2, and VHL.  The following genes were evaluated for sequence changes only: SDHA and HOXB13 c.251G>A variant only. The test report has been scanned into EPIC and is located under the Molecular Pathology section of the Results Review tab.  A portion of the result report is included below for reference.      We discussed with Jessica [name] that because current genetic testing is not perfect, it is possible there may be a gene mutation in one of these genes that current testing cannot detect, but that chance is small.  We also discussed, that there could be another gene that has not yet been discovered, or that we have not yet tested, that is responsible for the cancer diagnoses in the family. It is also possible there is a hereditary cause for the cancer in the family that Jessica [name] did not inherit and therefore was not identified in her testing.  Therefore, it is important to remain in touch with cancer genetics in the future so that we can  continue to offer Jessica [name] the most up to date genetic testing.  ADDITIONAL GENETIC TESTING: We discussed with Jessica [name] that her genetic testing was fairly extensive.  If there are genes identified to increase cancer risk that can be analyzed in the future, we would be happy to discuss and coordinate this testing at that time.    CANCER SCREENING RECOMMENDATIONS: Jessica [name] test result is considered negative (normal).  This means that we have not identified a hereditary cause for her  personal and family history of cancer at this time. Most cancers happen by chance and this negative test suggests that her cancer may fall into this category.    While reassuring, this does not definitively rule out a hereditary predisposition to cancer. It is still possible that there could be genetic mutations that are undetectable by current technology. There could be genetic mutations in genes that have not been tested or identified to increase cancer risk.  Therefore, it is recommended she continue to follow the cancer management and screening guidelines provided by her  oncology and primary healthcare provider.   An individual's cancer risk and medical management are not determined by genetic test results alone. Overall cancer risk assessment incorporates additional factors, including personal medical history, family history, and any available genetic information that may result in a personalized plan for cancer prevention and surveillance.  RECOMMENDATIONS FOR FAMILY MEMBERS:  Relatives in this family might be at some increased risk of developing cancer, over the general population risk, simply due to the family history of cancer.  We recommended female relatives in this family have a yearly mammogram beginning at age 60, or 46 years younger than the earliest onset of cancer, an annual clinical breast exam, and perform monthly breast self-exams. Female relatives in this family should also have a  gynecological exam as recommended by their primary provider. All family members should have a colonoscopy by age 73, or as directed by their physicians.  FOLLOW-UP: Lastly, we discussed with Jessica [name] that cancer genetics is a rapidly advancing field and it is possible that new genetic tests will be appropriate for her and/or her family members in the future. We encouraged her to remain in contact with cancer genetics on an annual basis so we can update her personal and family histories and let her know of advances in cancer genetics that may benefit this family.   Our contact number was provided. Jessica [name] questions were answered to her satisfaction, and she knows she is welcome to call us at anytime with additional questions or concerns.   Faith Rogue, MS, [facility] Genetic Counselor [email or web address] Phone: [phone]

## 2019-11-14 NOTE — Telephone Encounter (Signed)
Revealed negative genetic testing.   We discussed that we do not know why she has breast cancer or why there is cancer in the family. It could be due to a different gene that we are not testing, or something our current technology cannot pick up.  It will be important for her to keep in contact with genetics to learn if additional testing may be needed in the future.  

## 2019-11-15 ENCOUNTER — Encounter: Payer: Self-pay | Admitting: Oncology

## 2019-11-15 ENCOUNTER — Encounter: Payer: Self-pay | Admitting: *Deleted

## 2019-11-15 ENCOUNTER — Other Ambulatory Visit: Payer: Self-pay | Admitting: Oncology

## 2019-11-15 MED ORDER — ZOLPIDEM TARTRATE 5 MG PO TABS: 5.0000 mg | ORAL_TABLET | Freq: Every evening | ORAL | 0 refills | Status: DC | PRN

## 2019-11-15 NOTE — Progress Notes (Signed)
Talked to patient today.  States she is tolerating her Taxol well.  No complaints.  States she has cut her hair and has significant hair thinning, but is overall happy with the Dignicapp.  Received verbal consent to send her name in for nomination to [place] for a "box of hope".  She is to call with any questions or needs.

## 2019-11-17 ENCOUNTER — Other Ambulatory Visit: Payer: Self-pay

## 2019-11-17 NOTE — Progress Notes (Signed)
Patient pre screened for office appointment, no questions or concerns today. Patient reminded of upcoming appointment time and date. 

## 2019-11-20 ENCOUNTER — Inpatient Hospital Stay (HOSPITAL_BASED_OUTPATIENT_CLINIC_OR_DEPARTMENT_OTHER): Payer: BC Managed Care – PPO | Admitting: Oncology

## 2019-11-20 ENCOUNTER — Inpatient Hospital Stay: Payer: BC Managed Care – PPO

## 2019-11-20 ENCOUNTER — Other Ambulatory Visit: Payer: Self-pay

## 2019-11-20 ENCOUNTER — Encounter: Payer: Self-pay | Admitting: Oncology

## 2019-11-20 VITALS — BP 122/81 | HR 99 | Temp 98.3°F | Resp 18 | Wt 219.9 lb

## 2019-11-20 DIAGNOSIS — D649 Anemia, unspecified: Secondary | ICD-10-CM

## 2019-11-20 DIAGNOSIS — M5430 Sciatica, unspecified side: Secondary | ICD-10-CM

## 2019-11-20 DIAGNOSIS — C50919 Malignant neoplasm of unspecified site of unspecified female breast: Secondary | ICD-10-CM

## 2019-11-20 DIAGNOSIS — Z170 Estrogen receptor positive status [ER+]: Secondary | ICD-10-CM

## 2019-11-20 DIAGNOSIS — Z5111 Encounter for antineoplastic chemotherapy: Secondary | ICD-10-CM

## 2019-11-20 DIAGNOSIS — E876 Hypokalemia: Secondary | ICD-10-CM | POA: Diagnosis not present

## 2019-11-20 DIAGNOSIS — C50311 Malignant neoplasm of lower-inner quadrant of right female breast: Secondary | ICD-10-CM

## 2019-11-20 DIAGNOSIS — K219 Gastro-esophageal reflux disease without esophagitis: Secondary | ICD-10-CM

## 2019-11-20 LAB — CBC WITH DIFFERENTIAL/PLATELET
Abs Immature Granulocytes: 0.03 10*3/uL (ref 0.00–0.07)
Basophils Absolute: 0.1 10*3/uL (ref 0.0–0.1)
Basophils Relative: 1 %
Eosinophils Absolute: 0.3 10*3/uL (ref 0.0–0.5)
Eosinophils Relative: 5 %
HCT: 32.7 % — ABNORMAL LOW (ref 36.0–46.0)
Hemoglobin: 10.3 g/dL — ABNORMAL LOW (ref 12.0–15.0)
Immature Granulocytes: 1 %
Lymphocytes Relative: 20 %
Lymphs Abs: 1.2 10*3/uL (ref 0.7–4.0)
MCH: 28.7 pg (ref 26.0–34.0)
MCHC: 31.5 g/dL (ref 30.0–36.0)
MCV: 91.1 fL (ref 80.0–100.0)
Monocytes Absolute: 0.4 10*3/uL (ref 0.1–1.0)
Monocytes Relative: 7 %
Neutro Abs: 4.1 10*3/uL (ref 1.7–7.7)
Neutrophils Relative %: 66 %
Platelets: 347 10*3/uL (ref 150–400)
RBC: 3.59 MIL/uL — ABNORMAL LOW (ref 3.87–5.11)
RDW: 16.6 % — ABNORMAL HIGH (ref 11.5–15.5)
WBC: 6.1 10*3/uL (ref 4.0–10.5)
nRBC: 0 % (ref 0.0–0.2)

## 2019-11-20 LAB — COMPREHENSIVE METABOLIC PANEL
ALT: 22 U/L (ref 0–44)
AST: 20 U/L (ref 15–41)
Albumin: 3.8 g/dL (ref 3.5–5.0)
Alkaline Phosphatase: 96 U/L (ref 38–126)
Anion gap: 9 (ref 5–15)
BUN: 11 mg/dL (ref 6–20)
CO2: 24 mmol/L (ref 22–32)
Calcium: 8.7 mg/dL — ABNORMAL LOW (ref 8.9–10.3)
Chloride: 103 mmol/L (ref 98–111)
Creatinine, Ser: 0.79 mg/dL (ref 0.44–1.00)
GFR calc Af Amer: >60 mL/min (ref >60)
GFR calc non Af Amer: >60 mL/min (ref >60)
Glucose, Bld: 192 mg/dL — ABNORMAL HIGH (ref 70–99)
Potassium: 3.6 mmol/L (ref 3.5–5.1)
Sodium: 136 mmol/L (ref 135–145)
Total Bilirubin: 0.5 mg/dL (ref 0.3–1.2)
Total Protein: 6.8 g/dL (ref 6.5–8.1)

## 2019-11-20 LAB — MAGNESIUM: Magnesium: 1.9 mg/dL (ref 1.7–2.4)

## 2019-11-20 MED ORDER — HEPARIN SOD (PORK) LOCK FLUSH 100 UNIT/ML IV SOLN
500.0000 [IU] | Freq: Once | INTRAVENOUS | Status: AC
  Administered 2019-11-20: 500 [IU] via INTRAVENOUS
  Filled 2019-11-20: qty 5

## 2019-11-20 MED ORDER — FAMOTIDINE IN NACL 20-0.9 MG/50ML-% IV SOLN
20.0000 mg | Freq: Once | INTRAVENOUS | Status: AC
  Administered 2019-11-20: 20 mg via INTRAVENOUS
  Filled 2019-11-20: qty 50

## 2019-11-20 MED ORDER — SODIUM CHLORIDE 0.9 % IV SOLN
20.0000 mg | Freq: Once | INTRAVENOUS | Status: AC
  Administered 2019-11-20: 20 mg via INTRAVENOUS
  Filled 2019-11-20: qty 20

## 2019-11-20 MED ORDER — SODIUM CHLORIDE 0.9 % IV SOLN
Freq: Once | INTRAVENOUS | Status: AC
  Filled 2019-11-20: qty 250

## 2019-11-20 MED ORDER — HEPARIN SOD (PORK) LOCK FLUSH 100 UNIT/ML IV SOLN
INTRAVENOUS | Status: AC
  Filled 2019-11-20: qty 5

## 2019-11-20 MED ORDER — SODIUM CHLORIDE 0.9 % IV SOLN
80.0000 mg/m2 | Freq: Once | INTRAVENOUS | Status: AC
  Administered 2019-11-20: 168 mg via INTRAVENOUS
  Filled 2019-11-20: qty 28

## 2019-11-20 MED ORDER — LORAZEPAM 2 MG/ML IJ SOLN
0.5000 mg | Freq: Once | INTRAMUSCULAR | Status: AC
  Administered 2019-11-20: 0.5 mg via INTRAVENOUS
  Filled 2019-11-20: qty 1

## 2019-11-20 MED ORDER — DIPHENHYDRAMINE HCL 50 MG/ML IJ SOLN
50.0000 mg | Freq: Once | INTRAMUSCULAR | Status: AC
  Administered 2019-11-20: 50 mg via INTRAVENOUS
  Filled 2019-11-20: qty 1

## 2019-11-20 MED ORDER — ZOLPIDEM TARTRATE 5 MG PO TABS: 5.0000 mg | ORAL_TABLET | Freq: Every evening | ORAL | 0 refills | Status: DC | PRN

## 2019-11-20 MED ORDER — SODIUM CHLORIDE 0.9% FLUSH
10.0000 mL | Freq: Once | INTRAVENOUS | Status: AC
  Administered 2019-11-20: 10 mL via INTRAVENOUS
  Filled 2019-11-20: qty 10

## 2019-11-20 MED ORDER — HEPARIN SOD (PORK) LOCK FLUSH 100 UNIT/ML IV SOLN
500.0000 [IU] | Freq: Once | INTRAVENOUS | Status: DC | PRN
  Filled 2019-11-20: qty 5

## 2019-11-20 MED ORDER — OMEPRAZOLE 20 MG PO CPDR: DELAYED_RELEASE_CAPSULE | ORAL | 0 refills | Status: DC

## 2019-11-20 NOTE — Progress Notes (Signed)
Pt tolerated infusion well. Dignicap done per protocol. Pt stable at discharge.

## 2019-11-20 NOTE — Progress Notes (Signed)
The Ambien did help with her insomnia.  Her leg pain is 3/10 today.  Would like to discuss a medication to help with "brain freeze" from the dignicap

## 2019-11-20 NOTE — Progress Notes (Signed)
[place]   [phone] [phone]   Clinic Follow up Note   Patient Care Team: Ricardo Jericho, NP as PCP - General (Family Medicine) Rico Junker, RN as Registered Nurse  Date of Service:  11/20/2019  CHIEF COMPLAINT: F/u of right breast cancer   SUMMARY OF ONCOLOGIC HISTORY: Oncology History Overview Note  Cancer Staging Malignant neoplasm of lower-inner quadrant of right breast of female, estrogen receptor positive ([place]) Staging form: Breast, AJCC 8th Edition - Clinical stage from 07/26/2019: Stage IIB (cT2, cN1, cM0, G3, ER+, PR+, HER2-) - Unsigned    Malignant neoplasm of lower-inner quadrant of right breast of female, estrogen receptor positive ([place])  07/20/2019 Mammogram   Mammogram 07/20/19  IMPRESSION: Suspicious palpable right breast mass 3.4 x 2.5 x 3.4 cm 5 o'clock position 3 cm from nipple.   Suspicious 1.6cm cortically thickened right axillary lymph node.   07/26/2019 Initial Diagnosis   DIAGNOSIS: 07/26/19 A. RIGHT BREAST, 5:00, 3CMFN; ULTRASOUND-GUIDED NEEDLE CORE BIOPSY:  - INVASIVE MAMMARY CARCINOMA, NO SPECIAL TYPE.    07/26/2019 Receptors her2   BREAST BIOMARKER TESTS  Estrogen Receptor (ER) Status: POSITIVE                       Percentage of cells with nuclear positivity: 51-90%                       Average intensity of staining: Strong   Progesterone Receptor (PgR) Status: POSITIVE                       Percentage of cells with nuclear positivity:  Greater than 90%                       Average intensity of staining: Strong   HER2 (by immunohistochemistry): NEGATIVE (Score 1+)    08/05/2019 Initial Diagnosis   Invasive carcinoma of breast ([place])   08/17/2019 Surgery   INSERTION PORT-A-CATH by Dr. Lysle Pearl 08/17/19    08/18/2019 -  Chemotherapy   AC q2weeks for 4 cycles starting 08/18/19 followed by weekly Taxol for 12 weeks     Genetic Testing   Negative genetic testing. No pathogenic variants  identified on the [facility] Common Hereditary Cancers Panel. The report date is 11/13/2019.   The Common Hereditary Cancers Panel offered by [facility] includes sequencing and/or deletion duplication testing of the following 48 genes: APC, ATM, AXIN2, BARD1, BMPR1A, BRCA1, BRCA2, BRIP1, CDH1, CDKN2A (p14ARF), CDKN2A (p16INK4a), CKD4, CHEK2, CTNNA1, DICER1, EPCAM (Deletion/duplication testing only), GREM1 (promoter region deletion/duplication testing only), KIT, MEN1, MLH1, MSH2, MSH3, MSH6, MUTYH, NBN, NF1, NHTL1, PALB2, PDGFRA, PMS2, POLD1, POLE, PTEN, RAD50, RAD51C, RAD51D, RNF43, SDHB, SDHC, SDHD, SMAD4, SMARCA4. STK11, TP53, TSC1, TSC2, and VHL.  The following genes were evaluated for sequence changes only: SDHA and HOXB13 c.251G>A variant only.      CURRENT THERAPY:  Neoadjuvant chemo ddAC starting 08/18/19 for 4 cycles followed by weekly Taxol   INTERVAL HISTORY:  Jessica [name] presents to follow-up for evaluation prior to chemotherapy for breast cancer treatment. Patient has completed 4 cycles of dose dense Adriamycin and Cytoxan with scalp cooling system with Dr.Feng at [facility]. Currently on weekly Taxol. She is being also using the scalp cooling system and tolerates well.  She has mild hair loss. Patient denies any numbness or tingling of fingertips or toes. Chronic sciatic pain, symptoms are stable with gabapentin 300 mg  3 times daily. Chronic fatigue unchanged. Denies any nausea vomiting diarrhea. Insomnia has improved after using Ambien at bedtime as needed.  She requests for refills. She request some medication to be given prior to using Dignicap.  She heard from [employer] group that other patients have been given Ativan as premed and she would like to try to see if Ativan can help with the headache from using [place].  She usually takes [place] extra strength tablets prior to the treatments. She reports intermittent back pain, spontaneously resolved.  She does not have any  pain today.   Review of Systems  Constitutional: Positive for fatigue. Negative for appetite change, chills and fever.  HENT:   Negative for hearing loss and voice change.   Eyes: Negative for eye problems.  Respiratory: Negative for chest tightness and cough.   Cardiovascular: Negative for chest pain.  Gastrointestinal: Negative for abdominal distention, abdominal pain and blood in stool.  Endocrine: Negative for hot flashes.  Genitourinary: Negative for difficulty urinating and frequency.   Musculoskeletal: Negative for arthralgias.  Skin: Negative for itching and rash.  Neurological: Negative for extremity weakness.  Hematological: Negative for adenopathy.  Psychiatric/Behavioral: Negative for confusion.    MEDICAL HISTORY:  Past Medical History:  Diagnosis Date  . Breast cancer ([place])    right breast  . Cancer ([place])    breast cancer  . Family history of cervical cancer   . Family history of lung cancer   . Frequency   . Kidney stones   . Obesity   . Personal history of chemotherapy   . Right flank pain     SURGICAL HISTORY: Past Surgical History:  Procedure Laterality Date  . BREAST BIOPSY    . CESAREAN SECTION     x 2  . CHOLECYSTECTOMY    . DILATION AND CURETTAGE OF UTERUS    . PORTACATH PLACEMENT Left 08/17/2019   Procedure: INSERTION PORT-A-CATH;  Surgeon: Benjamine Sprague, DO;  Location: [facility] ORS;  Service: General;  Laterality: Left;  . TUBAL LIGATION      I have reviewed the social history and family history with the patient and they are unchanged from previous note.  ALLERGIES:  has No Known Allergies.  MEDICATIONS:  Current Outpatient Medications  Medication Sig Dispense Refill  . Aspirin-Acetaminophen-Caffeine (GOODYS EXTRA STRENGTH PO) Take by mouth.    . ferrous sulfate 325 (65 FE) MG EC tablet TAKE 1 TABLET (325 MG TOTAL) BY MOUTH 2 (TWO) TIMES DAILY WITH A MEAL. 60 tablet 1  . gabapentin (NEURONTIN) 600 MG tablet Take 600 mg by mouth 3 (three)  times daily.    Marland Kitchen lidocaine-prilocaine (EMLA) cream Apply to affected area once 30 g 3  . Melatonin 5 MG CAPS Take 1 capsule (5 mg total) by mouth at bedtime as needed. 30 capsule 0  . omeprazole (PRILOSEC) 20 MG capsule TAKE 1 CAPSULE BY MOUTH EVERY DAY (Patient taking differently: daily as needed. TAKE 1 CAPSULE BY MOUTH EVERY DAY) 90 capsule 0  . prochlorperazine (COMPAZINE) 10 MG tablet Take 1 tablet (10 mg total) by mouth every 6 (six) hours as needed (Nausea or vomiting). 30 tablet 1  . sucralfate (CARAFATE) 1 g tablet Take 1 tablet (1 g total) by mouth 2 (two) times daily. 60 tablet 0  . zolpidem (AMBIEN) 5 MG tablet Take 1 tablet (5 mg total) by mouth at bedtime as needed for sleep. 7 tablet 0   No current facility-administered medications for this visit.   Facility-Administered Medications  Ordered in Other Visits  Medication Dose Route Frequency Provider Last Rate Last Admin  . heparin lock flush 100 unit/mL  500 Units Intravenous Once Earlie Server, MD      . sodium chloride flush (NS) 0.9 % injection 10 mL  10 mL Intravenous Once Earlie Server, MD        PHYSICAL EXAMINATION: ECOG PERFORMANCE STATUS: 0 - Asymptomatic  Vitals:   11/20/19 0838  BP: 122/81  Pulse: 99  Resp: 18  Temp: 98.3 F (36.8 C)   Filed Weights   11/20/19 0838  Weight: 219 lb 14.4 oz (99.7 kg)   Physical Exam  Constitutional: She is oriented to person, place, and time. No distress.  HENT:  Head: Normocephalic and atraumatic.  Nose: Nose normal.  Mouth/Throat: Oropharynx is clear and moist. No oropharyngeal exudate.  Eyes: Pupils are equal, round, and reactive to light. EOM are normal. No scleral icterus.  Cardiovascular: Normal rate and regular rhythm.  No murmur heard. Pulmonary/Chest: Effort normal. No respiratory distress. She has no rales. She exhibits no tenderness.  Abdominal: Soft. She exhibits no distension. There is no abdominal tenderness.  Musculoskeletal:        General: No edema. Normal  range of motion.     Cervical back: Normal range of motion and neck supple.  Neurological: She is alert and oriented to person, place, and time. No cranial nerve deficit. She exhibits normal muscle tone. Coordination normal.  Skin: Skin is warm and dry. She is not diaphoretic. No erythema.  Psychiatric: Affect normal.     LABORATORY DATA:  I have reviewed the data as listed CBC Latest Ref Rng & Units 11/13/2019 11/06/2019 10/30/2019  WBC 4.0 - 10.5 K/uL 6.2 6.7 6.3  Hemoglobin 12.0 - 15.0 g/dL 10.2(L) 9.9(L) 10.7(L)  Hematocrit 36.0 - 46.0 % 32.7(L) 31.5(L) 34.0(L)  Platelets 150 - 400 K/uL 320 335 375     CMP Latest Ref Rng & Units 11/13/2019 11/06/2019 10/30/2019  Glucose 70 - 99 mg/dL 167(H) 158(H) 143(H)  BUN 6 - 20 mg/dL _0 Creatinine 0.44 - 1.00 mg/dL 0.73 0.75 0.72  Sodium 135 - 145 mmol/L 137 137 138  Potassium 3.5 - 5.1 mmol/L 3.4(L) 3.5 3.5  Chloride 98 - 111 mmol/L 103 104 104  CO2 22 - 32 mmol/L _1 Calcium 8.9 - 10.3 mg/dL 8.9 8.8(L) 9.0  Total Protein 6.5 - 8.1 g/dL 6.9 6.8 7.4  Total Bilirubin 0.3 - 1.2 mg/dL 0.5 0.5 0.6  Alkaline Phos 38 - 126 U/L 92 83 85  AST 15 - 41 U/L _2 ALT 0 - 44 U/L _3 RADIOGRAPHIC STUDIES: I have personally reviewed the radiological images as listed and agreed with the findings in the report. US Breast Limited Uni Right Inc Axilla  Result Date: 10/02/2019 CLINICAL DATA:  44 year old female with biopsy proven invasive mammary carcinoma of the right breast and metastatic axillary adenopathy. Assess response to neoadjuvant chemotherapy. EXAM: DIGITAL DIAGNOSTIC RIGHT MAMMOGRAM WITH CAD AND TOMO ULTRASOUND RIGHT BREAST COMPARISON:  Previous exam(s). ACR Breast Density Category b: There are scattered areas of fibroglandular density. FINDINGS: There is a 2.1 cm mass in the lower inner quadrant of the right breast. There is a heart shaped clip located in the mass. There is an enlarged right axillary lymph node with a  HydroMARK clip. No additional mass is seen in the right breast. There are no malignant type microcalcifications. Mammographic images were  processed with CAD. Targeted ultrasound is performed, showing there is an irregular hypoechoic mass in the right breast at 5 o'clock 3 cm from the nipple measuring 2.8 x 1.6 x 2.8 cm. There is a solitary enlarged axillary lymph node measuring 3.3 x 1.5 cm (cortical thickening 1.5 cm). On the prior ultrasound dated 07/20/2019 the mass in the right breast at 5 o'clock 3 cm from the nipple measured 3.4 x 2.4 x 3.4 cm and the lymph node cortical thickening measured 1.6 cm. IMPRESSION: Interval reduction in the size of the mass in the 5 o'clock region of the right breast now measuring 2.8 cm versus 3.3 cm on the prior exam. The axillary lymph node is relatively unchanged. RECOMMENDATION: Continued treatment planning of the known right breast invasive mammary carcinoma and metastatic axillary adenopathy is recommended. I have discussed the findings and recommendations with the patient. If applicable, a reminder letter will be sent to the patient regarding the next appointment. BI-RADS CATEGORY  6: Known biopsy-proven malignancy. Electronically Signed   By: Lillia Mountain M.D.   On: 10/02/2019 15:29   MM DIAG BREAST TOMO UNI RIGHT  Result Date: 10/02/2019 CLINICAL DATA:  44 year old female with biopsy proven invasive mammary carcinoma of the right breast and metastatic axillary adenopathy. Assess response to neoadjuvant chemotherapy. EXAM: DIGITAL DIAGNOSTIC RIGHT MAMMOGRAM WITH CAD AND TOMO ULTRASOUND RIGHT BREAST COMPARISON:  Previous exam(s). ACR Breast Density Category b: There are scattered areas of fibroglandular density. FINDINGS: There is a 2.1 cm mass in the lower inner quadrant of the right breast. There is a heart shaped clip located in the mass. There is an enlarged right axillary lymph node with a HydroMARK clip. No additional mass is seen in the right breast. There are no  malignant type microcalcifications. Mammographic images were processed with CAD. Targeted ultrasound is performed, showing there is an irregular hypoechoic mass in the right breast at 5 o'clock 3 cm from the nipple measuring 2.8 x 1.6 x 2.8 cm. There is a solitary enlarged axillary lymph node measuring 3.3 x 1.5 cm (cortical thickening 1.5 cm). On the prior ultrasound dated 07/20/2019 the mass in the right breast at 5 o'clock 3 cm from the nipple measured 3.4 x 2.4 x 3.4 cm and the lymph node cortical thickening measured 1.6 cm. IMPRESSION: Interval reduction in the size of the mass in the 5 o'clock region of the right breast now measuring 2.8 cm versus 3.3 cm on the prior exam. The axillary lymph node is relatively unchanged. RECOMMENDATION: Continued treatment planning of the known right breast invasive mammary carcinoma and metastatic axillary adenopathy is recommended. I have discussed the findings and recommendations with the patient. If applicable, a reminder letter will be sent to the patient regarding the next appointment. BI-RADS CATEGORY  6: Known biopsy-proven malignancy. Electronically Signed   By: Lillia Mountain M.D.   On: 10/02/2019 15:29     ASSESSMENT & PLAN:  1. Malignant neoplasm of lower-inner quadrant of right breast of female, estrogen receptor positive ([place])   2. Encounter for antineoplastic chemotherapy   3. Normocytic anemia   4. Hypokalemia   5. Sciatic nerve pain, unspecified laterality   6. Gastroesophageal reflux disease, unspecified whether esophagitis present     Invasive right breast cancer,  Cancer Staging Malignant neoplasm of lower-inner quadrant of right breast of female, estrogen receptor positive ([place]) Staging form: Breast, AJCC 8th Edition - Clinical stage from 07/26/2019: Stage IIB (cT2, cN1, cM0, G3, ER+, PR+, HER2-) - Unsigned -Status post 4  cycles of dose dense Adriamycin and Cytoxan.   -Patient is currently on weekly Taxol with scalp cooling  system. Clinically doing well.  Labs are reviewed and discussed with patient today. Her counts acceptable to proceed with today's Taxol treatment. Patient uses scalp cooling system.  Ativan 0.74m will be added as a premed per patient's request.  #Pre-existing sciatic pain, continue gabapentin 300 mg 3 times daily.  #Anemia, multifactorial, from chemotherapy as well as iron deficiency.   Continue iron supplementation. Her hemoglobin has been stable at 10.3 today.  #Mild hypokalemia potassium level 3.6, improved.  Continue potassium rich food. #Insomnia, likely secondary to chemotherapy/steroids. Continue Ambien [id number] PRN. Refill was sent to pharmacy. Side effects were discussed with patient.   Follow-up in 1 week for assessment for next cycle of Taxol.  All questions were answered. The patient knows to call the clinic with any problems, questions or concerns. No barriers to learning was detected.     ZhEarlie ServerMD 11/20/2019

## 2019-11-27 ENCOUNTER — Inpatient Hospital Stay: Payer: BC Managed Care – PPO

## 2019-11-27 ENCOUNTER — Encounter: Payer: Self-pay | Admitting: Oncology

## 2019-11-27 ENCOUNTER — Inpatient Hospital Stay (HOSPITAL_BASED_OUTPATIENT_CLINIC_OR_DEPARTMENT_OTHER): Payer: BC Managed Care – PPO | Admitting: Oncology

## 2019-11-27 ENCOUNTER — Other Ambulatory Visit: Payer: Self-pay

## 2019-11-27 VITALS — HR 100

## 2019-11-27 VITALS — BP 125/84 | HR 105 | Temp 98.4°F | Resp 16 | Wt 218.8 lb

## 2019-11-27 DIAGNOSIS — E876 Hypokalemia: Secondary | ICD-10-CM

## 2019-11-27 DIAGNOSIS — Z170 Estrogen receptor positive status [ER+]: Secondary | ICD-10-CM

## 2019-11-27 DIAGNOSIS — Z5111 Encounter for antineoplastic chemotherapy: Secondary | ICD-10-CM

## 2019-11-27 DIAGNOSIS — C50311 Malignant neoplasm of lower-inner quadrant of right female breast: Secondary | ICD-10-CM

## 2019-11-27 DIAGNOSIS — C50919 Malignant neoplasm of unspecified site of unspecified female breast: Secondary | ICD-10-CM

## 2019-11-27 DIAGNOSIS — T451X5A Adverse effect of antineoplastic and immunosuppressive drugs, initial encounter: Secondary | ICD-10-CM

## 2019-11-27 DIAGNOSIS — G620 Drug-induced polyneuropathy: Secondary | ICD-10-CM

## 2019-11-27 DIAGNOSIS — Z95828 Presence of other vascular implants and grafts: Secondary | ICD-10-CM

## 2019-11-27 DIAGNOSIS — D649 Anemia, unspecified: Secondary | ICD-10-CM

## 2019-11-27 LAB — COMPREHENSIVE METABOLIC PANEL
ALT: 28 U/L (ref 0–44)
AST: 22 U/L (ref 15–41)
Albumin: 3.9 g/dL (ref 3.5–5.0)
Alkaline Phosphatase: 91 U/L (ref 38–126)
Anion gap: 10 (ref 5–15)
BUN: 11 mg/dL (ref 6–20)
CO2: 24 mmol/L (ref 22–32)
Calcium: 8.8 mg/dL — ABNORMAL LOW (ref 8.9–10.3)
Chloride: 103 mmol/L (ref 98–111)
Creatinine, Ser: 0.71 mg/dL (ref 0.44–1.00)
GFR calc Af Amer: >60 mL/min (ref >60)
GFR calc non Af Amer: >60 mL/min (ref >60)
Glucose, Bld: 144 mg/dL — ABNORMAL HIGH (ref 70–99)
Potassium: 3.5 mmol/L (ref 3.5–5.1)
Sodium: 137 mmol/L (ref 135–145)
Total Bilirubin: 0.6 mg/dL (ref 0.3–1.2)
Total Protein: 6.8 g/dL (ref 6.5–8.1)

## 2019-11-27 LAB — CBC WITH DIFFERENTIAL/PLATELET
Abs Immature Granulocytes: 0.02 10*3/uL (ref 0.00–0.07)
Basophils Absolute: 0.1 10*3/uL (ref 0.0–0.1)
Basophils Relative: 1 %
Eosinophils Absolute: 0.2 10*3/uL (ref 0.0–0.5)
Eosinophils Relative: 4 %
HCT: 33.2 % — ABNORMAL LOW (ref 36.0–46.0)
Hemoglobin: 10.5 g/dL — ABNORMAL LOW (ref 12.0–15.0)
Immature Granulocytes: 0 %
Lymphocytes Relative: 23 %
Lymphs Abs: 1.3 10*3/uL (ref 0.7–4.0)
MCH: 29 pg (ref 26.0–34.0)
MCHC: 31.6 g/dL (ref 30.0–36.0)
MCV: 91.7 fL (ref 80.0–100.0)
Monocytes Absolute: 0.4 10*3/uL (ref 0.1–1.0)
Monocytes Relative: 7 %
Neutro Abs: 3.7 10*3/uL (ref 1.7–7.7)
Neutrophils Relative %: 65 %
Platelets: 371 10*3/uL (ref 150–400)
RBC: 3.62 MIL/uL — ABNORMAL LOW (ref 3.87–5.11)
RDW: 16.6 % — ABNORMAL HIGH (ref 11.5–15.5)
WBC: 5.7 10*3/uL (ref 4.0–10.5)
nRBC: 0.4 % — ABNORMAL HIGH (ref 0.0–0.2)

## 2019-11-27 LAB — MAGNESIUM: Magnesium: 1.9 mg/dL (ref 1.7–2.4)

## 2019-11-27 MED ORDER — SODIUM CHLORIDE 0.9% FLUSH
10.0000 mL | Freq: Once | INTRAVENOUS | Status: AC
  Administered 2019-11-27: 10 mL via INTRAVENOUS
  Filled 2019-11-27: qty 10

## 2019-11-27 MED ORDER — SODIUM CHLORIDE 0.9 % IV SOLN
20.0000 mg | Freq: Once | INTRAVENOUS | Status: AC
  Administered 2019-11-27: 20 mg via INTRAVENOUS
  Filled 2019-11-27: qty 20

## 2019-11-27 MED ORDER — DIPHENHYDRAMINE HCL 50 MG/ML IJ SOLN
50.0000 mg | Freq: Once | INTRAMUSCULAR | Status: AC
  Administered 2019-11-27: 50 mg via INTRAVENOUS
  Filled 2019-11-27: qty 1

## 2019-11-27 MED ORDER — FAMOTIDINE IN NACL 20-0.9 MG/50ML-% IV SOLN
20.0000 mg | Freq: Once | INTRAVENOUS | Status: AC
  Administered 2019-11-27: 20 mg via INTRAVENOUS
  Filled 2019-11-27: qty 50

## 2019-11-27 MED ORDER — SODIUM CHLORIDE 0.9 % IV SOLN
80.0000 mg/m2 | Freq: Once | INTRAVENOUS | Status: AC
  Administered 2019-11-27: 168 mg via INTRAVENOUS
  Filled 2019-11-27: qty 28

## 2019-11-27 MED ORDER — LORAZEPAM 2 MG/ML IJ SOLN
1.0000 mg | Freq: Once | INTRAMUSCULAR | Status: AC
  Administered 2019-11-27: 1 mg via INTRAVENOUS
  Filled 2019-11-27: qty 1

## 2019-11-27 MED ORDER — SODIUM CHLORIDE 0.9 % IV SOLN
Freq: Once | INTRAVENOUS | Status: AC
  Filled 2019-11-27: qty 250

## 2019-11-27 MED ORDER — HEPARIN SOD (PORK) LOCK FLUSH 100 UNIT/ML IV SOLN
500.0000 [IU] | Freq: Once | INTRAVENOUS | Status: AC | PRN
  Administered 2019-11-27: 500 [IU]
  Filled 2019-11-27: qty 5

## 2019-11-27 NOTE — Progress Notes (Signed)
[place]   [phone] [phone]   Clinic Follow up Note   Patient Care Team: Ricardo Jericho, NP as PCP - General (Family Medicine) Rico Junker, RN as Registered Nurse  Date of Service:  11/27/2019  CHIEF COMPLAINT: F/u of right breast cancer   SUMMARY OF ONCOLOGIC HISTORY: Oncology History Overview Note  Cancer Staging Malignant neoplasm of lower-inner quadrant of right breast of female, estrogen receptor positive ([place]) Staging form: Breast, AJCC 8th Edition - Clinical stage from 07/26/2019: Stage IIB (cT2, cN1, cM0, G3, ER+, PR+, HER2-) - Unsigned    Malignant neoplasm of lower-inner quadrant of right breast of female, estrogen receptor positive ([place])  07/20/2019 Mammogram   Mammogram 07/20/19  IMPRESSION: Suspicious palpable right breast mass 3.4 x 2.5 x 3.4 cm 5 o'clock position 3 cm from nipple.   Suspicious 1.6cm cortically thickened right axillary lymph node.   07/26/2019 Initial Diagnosis   DIAGNOSIS: 07/26/19 A. RIGHT BREAST, 5:00, 3CMFN; ULTRASOUND-GUIDED NEEDLE CORE BIOPSY:  - INVASIVE MAMMARY CARCINOMA, NO SPECIAL TYPE.    07/26/2019 Receptors her2   BREAST BIOMARKER TESTS  Estrogen Receptor (ER) Status: POSITIVE                       Percentage of cells with nuclear positivity: 51-90%                       Average intensity of staining: Strong   Progesterone Receptor (PgR) Status: POSITIVE                       Percentage of cells with nuclear positivity:  Greater than 90%                       Average intensity of staining: Strong   HER2 (by immunohistochemistry): NEGATIVE (Score 1+)    08/05/2019 Initial Diagnosis   Invasive carcinoma of breast ([place])   08/17/2019 Surgery   INSERTION PORT-A-CATH by Dr. Lysle Pearl 08/17/19    08/18/2019 -  Chemotherapy   AC q2weeks for 4 cycles starting 08/18/19 followed by weekly Taxol for 12 weeks     Genetic Testing   Negative genetic testing. No pathogenic variants  identified on the [facility] Common Hereditary Cancers Panel. The report date is 11/13/2019.   The Common Hereditary Cancers Panel offered by [facility] includes sequencing and/or deletion duplication testing of the following 48 genes: APC, ATM, AXIN2, BARD1, BMPR1A, BRCA1, BRCA2, BRIP1, CDH1, CDKN2A (p14ARF), CDKN2A (p16INK4a), CKD4, CHEK2, CTNNA1, DICER1, EPCAM (Deletion/duplication testing only), GREM1 (promoter region deletion/duplication testing only), KIT, MEN1, MLH1, MSH2, MSH3, MSH6, MUTYH, NBN, NF1, NHTL1, PALB2, PDGFRA, PMS2, POLD1, POLE, PTEN, RAD50, RAD51C, RAD51D, RNF43, SDHB, SDHC, SDHD, SMAD4, SMARCA4. STK11, TP53, TSC1, TSC2, and VHL.  The following genes were evaluated for sequence changes only: SDHA and HOXB13 c.251G>A variant only.      CURRENT THERAPY:  Neoadjuvant chemo ddAC starting 08/18/19 for 4 cycles followed by weekly Taxol   INTERVAL HISTORY:  DASHANAE LONGFIELD presents to follow-up for evaluation prior to chemotherapy for breast cancer treatment. Patient has completed 4 cycles of dose dense Adriamycin and Cytoxan with scalp cooling system with Dr.Feng at [facility]. Currently on weekly Taxol. She is being also using the scalp cooling system and tolerates well.  She has mild hair loss. Patient reports intermittent episodes of pins-and-needles on the bottom of her feet. She has been on gabapentin, previously 300 mg  3 times daily for sciatic pain.  Recently sciatic pain has improved so she has decreased her gabapentin to once a day on some days not every day. Chronic fatigue unchanged.  Denies nausea vomiting diarrhea. Insomnia has improved after using Ambien at bedtime as needed. She tried 0.5 mg Ativan prior to using the technique And she reports some improvement of her headache symptoms.  She does not need to use any Goody extra strength tablets prior to the treatments.  She requests to in crease Ativan dose so that her symptoms can be further improved. She also still  reports 1 episodes of bilateral lower extremity pain.  She took Tylenol and the pain improved.     Review of Systems  Constitutional: Positive for fatigue. Negative for appetite change, chills and fever.  HENT:   Negative for hearing loss and voice change.   Eyes: Negative for eye problems.  Respiratory: Negative for chest tightness and cough.   Cardiovascular: Negative for chest pain.  Gastrointestinal: Negative for abdominal distention, abdominal pain and blood in stool.  Endocrine: Negative for hot flashes.  Genitourinary: Negative for difficulty urinating and frequency.   Musculoskeletal: Negative for arthralgias.  Skin: Negative for itching and rash.  Neurological: Negative for extremity weakness.  Hematological: Negative for adenopathy.  Psychiatric/Behavioral: Negative for confusion.    MEDICAL HISTORY:  Past Medical History:  Diagnosis Date  . Breast cancer ([place])    right breast  . Cancer ([place])    breast cancer  . Family history of cervical cancer   . Family history of lung cancer   . Frequency   . Kidney stones   . Obesity   . Personal history of chemotherapy   . Right flank pain     SURGICAL HISTORY: Past Surgical History:  Procedure Laterality Date  . BREAST BIOPSY    . CESAREAN SECTION     x 2  . CHOLECYSTECTOMY    . DILATION AND CURETTAGE OF UTERUS    . PORTACATH PLACEMENT Left 08/17/2019   Procedure: INSERTION PORT-A-CATH;  Surgeon: Benjamine Sprague, DO;  Location: [facility] ORS;  Service: General;  Laterality: Left;  . TUBAL LIGATION      I have reviewed the social history and family history with the patient and they are unchanged from previous note.  ALLERGIES:  has No Known Allergies.  MEDICATIONS:  Current Outpatient Medications  Medication Sig Dispense Refill  . Aspirin-Acetaminophen-Caffeine (GOODYS EXTRA STRENGTH PO) Take by mouth.    . ferrous sulfate 325 (65 FE) MG EC tablet TAKE 1 TABLET (325 MG TOTAL) BY MOUTH 2 (TWO) TIMES DAILY WITH A MEAL.  60 tablet 1  . gabapentin (NEURONTIN) 600 MG tablet Take 600 mg by mouth 3 (three) times daily.    Marland Kitchen lidocaine-prilocaine (EMLA) cream Apply to affected area once 30 g 3  . omeprazole (PRILOSEC) 20 MG capsule TAKE 1 CAPSULE BY MOUTH EVERY DAY 90 capsule 0  . prochlorperazine (COMPAZINE) 10 MG tablet Take 1 tablet (10 mg total) by mouth every 6 (six) hours as needed (Nausea or vomiting). 30 tablet 1  . zolpidem (AMBIEN) 5 MG tablet Take 1 tablet (5 mg total) by mouth at bedtime as needed for sleep. 30 tablet 0   No current facility-administered medications for this visit.    PHYSICAL EXAMINATION: ECOG PERFORMANCE STATUS: 0 - Asymptomatic  Vitals:   11/27/19 0904  BP: 125/84  Pulse: (!) 105  Resp: 16  Temp: 98.4 F (36.9 C)   Filed Weights   11/27/19  6808  Weight: 218 lb 12.8 oz (99.2 kg)   Physical Exam  Constitutional: She is oriented to person, place, and time. No distress.  HENT:  Head: Normocephalic and atraumatic.  Nose: Nose normal.  Mouth/Throat: Oropharynx is clear and moist. No oropharyngeal exudate.  Eyes: Pupils are equal, round, and reactive to light. EOM are normal. No scleral icterus.  Cardiovascular: Normal rate and regular rhythm.  No murmur heard. Pulmonary/Chest: Effort normal. No respiratory distress. She has no rales. She exhibits no tenderness.  Abdominal: Soft. She exhibits no distension. There is no abdominal tenderness.  Musculoskeletal:        General: No edema. Normal range of motion.     Cervical back: Normal range of motion and neck supple.  Neurological: She is alert and oriented to person, place, and time. No cranial nerve deficit. She exhibits normal muscle tone. Coordination normal.  Skin: Skin is warm and dry. She is not diaphoretic. No erythema.  Psychiatric: Affect normal.     LABORATORY DATA:  I have reviewed the data as listed CBC Latest Ref Rng & Units 11/27/2019 11/20/2019 11/13/2019  WBC 4.0 - 10.5 K/uL 5.7 6.1 6.2  Hemoglobin 12.0  - 15.0 g/dL 10.5(L) 10.3(L) 10.2(L)  Hematocrit 36.0 - 46.0 % 33.2(L) 32.7(L) 32.7(L)  Platelets 150 - 400 K/uL 371 347 320     CMP Latest Ref Rng & Units 11/27/2019 11/20/2019 11/13/2019  Glucose 70 - 99 mg/dL 144(H) 192(H) 167(H)  BUN 6 - 20 mg/dL '11 11 9  ' Creatinine 0.44 - 1.00 mg/dL 0.71 0.79 0.73  Sodium 135 - 145 mmol/L 137 136 137  Potassium 3.5 - 5.1 mmol/L 3.5 3.6 3.4(L)  Chloride 98 - 111 mmol/L 103 103 103  CO2 22 - 32 mmol/L '24 24 24  ' Calcium 8.9 - 10.3 mg/dL 8.8(L) 8.7(L) 8.9  Total Protein 6.5 - 8.1 g/dL 6.8 6.8 6.9  Total Bilirubin 0.3 - 1.2 mg/dL 0.6 0.5 0.5  Alkaline Phos 38 - 126 U/L 91 96 92  AST 15 - 41 U/L '22 20 19  ' ALT 0 - 44 U/L '28 22 21      ' RADIOGRAPHIC STUDIES: I have personally reviewed the radiological images as listed and agreed with the findings in the report. US Breast Limited Uni Right Inc Axilla  Result Date: 10/02/2019 CLINICAL DATA:  44 year old female with biopsy proven invasive mammary carcinoma of the right breast and metastatic axillary adenopathy. Assess response to neoadjuvant chemotherapy. EXAM: DIGITAL DIAGNOSTIC RIGHT MAMMOGRAM WITH CAD AND TOMO ULTRASOUND RIGHT BREAST COMPARISON:  Previous exam(s). ACR Breast Density Category b: There are scattered areas of fibroglandular density. FINDINGS: There is a 2.1 cm mass in the lower inner quadrant of the right breast. There is a heart shaped clip located in the mass. There is an enlarged right axillary lymph node with a HydroMARK clip. No additional mass is seen in the right breast. There are no malignant type microcalcifications. Mammographic images were processed with CAD. Targeted ultrasound is performed, showing there is an irregular hypoechoic mass in the right breast at 5 o'clock 3 cm from the nipple measuring 2.8 x 1.6 x 2.8 cm. There is a solitary enlarged axillary lymph node measuring 3.3 x 1.5 cm (cortical thickening 1.5 cm). On the prior ultrasound dated 07/20/2019 the mass in the right  breast at 5 o'clock 3 cm from the nipple measured 3.4 x 2.4 x 3.4 cm and the lymph node cortical thickening measured 1.6 cm. IMPRESSION: Interval reduction in the size of the mass  in the 5 o'clock region of the right breast now measuring 2.8 cm versus 3.3 cm on the prior exam. The axillary lymph node is relatively unchanged. RECOMMENDATION: Continued treatment planning of the known right breast invasive mammary carcinoma and metastatic axillary adenopathy is recommended. I have discussed the findings and recommendations with the patient. If applicable, a reminder letter will be sent to the patient regarding the next appointment. BI-RADS CATEGORY  6: Known biopsy-proven malignancy. Electronically Signed   By: Lillia Mountain M.D.   On: 10/02/2019 15:29   MM DIAG BREAST TOMO UNI RIGHT  Result Date: 10/02/2019 CLINICAL DATA:  44 year old female with biopsy proven invasive mammary carcinoma of the right breast and metastatic axillary adenopathy. Assess response to neoadjuvant chemotherapy. EXAM: DIGITAL DIAGNOSTIC RIGHT MAMMOGRAM WITH CAD AND TOMO ULTRASOUND RIGHT BREAST COMPARISON:  Previous exam(s). ACR Breast Density Category b: There are scattered areas of fibroglandular density. FINDINGS: There is a 2.1 cm mass in the lower inner quadrant of the right breast. There is a heart shaped clip located in the mass. There is an enlarged right axillary lymph node with a HydroMARK clip. No additional mass is seen in the right breast. There are no malignant type microcalcifications. Mammographic images were processed with CAD. Targeted ultrasound is performed, showing there is an irregular hypoechoic mass in the right breast at 5 o'clock 3 cm from the nipple measuring 2.8 x 1.6 x 2.8 cm. There is a solitary enlarged axillary lymph node measuring 3.3 x 1.5 cm (cortical thickening 1.5 cm). On the prior ultrasound dated 07/20/2019 the mass in the right breast at 5 o'clock 3 cm from the nipple measured 3.4 x 2.4 x 3.4 cm and the  lymph node cortical thickening measured 1.6 cm. IMPRESSION: Interval reduction in the size of the mass in the 5 o'clock region of the right breast now measuring 2.8 cm versus 3.3 cm on the prior exam. The axillary lymph node is relatively unchanged. RECOMMENDATION: Continued treatment planning of the known right breast invasive mammary carcinoma and metastatic axillary adenopathy is recommended. I have discussed the findings and recommendations with the patient. If applicable, a reminder letter will be sent to the patient regarding the next appointment. BI-RADS CATEGORY  6: Known biopsy-proven malignancy. Electronically Signed   By: Lillia Mountain M.D.   On: 10/02/2019 15:29     ASSESSMENT & PLAN:  1. Malignant neoplasm of lower-inner quadrant of right breast of female, estrogen receptor positive ([place])   2. Encounter for antineoplastic chemotherapy   3. Neuropathy due to chemotherapeutic drug ([place])     Invasive right breast cancer,  Cancer Staging Malignant neoplasm of lower-inner quadrant of right breast of female, estrogen receptor positive ([place]) Staging form: Breast, AJCC 8th Edition - Clinical stage from 07/26/2019: Stage IIB (cT2, cN1, cM0, G3, ER+, PR+, HER2-) - Unsigned -Status post 4 cycles of dose dense Adriamycin and Cytoxan.   -Patient is currently on weekly Taxol with scalp cooling system. Overall she tolerates well with mild difficulties. Labs are reviewed and discussed with patient.  Counts acceptable to proceed with today's Taxol treatments. Patient uses [employer] system.  Per patient's request, patient will be given Ativan 1 mg prior to each treatments to help ease her symptoms.  #Neuropathy secondary to chemotherapy, intermittent, grade 1. Advised patient to resume gabapentin at least 300 to 600 mg once daily.  She was previously on higher doses due to sciatic pain and recently she decreased her gabapentin to daily as needed.  Discussed about future dose  reduction if neuropathy gets  worse despite gabapentin use.    #Anemia, multifactorial, from chemotherapy as well as iron deficiency.   Continue oral iron supplementation.  Hemoglobin has been stable.  Hemoglobin is 10.5 today.   #Mild hypokalemia potassium level 3.5.  Continue potassium chloride 10 mEq daily. #Insomnia, likely secondary to chemotherapy/steroids.  Continue Ambien 83m QHS PRN. Refill was sent to pharmacy. Side effects were discussed with patient.   Follow-up in 1 week for assessment for next cycle of Taxol.  All questions were answered. The patient knows to call the clinic with any problems, questions or concerns. No barriers to learning was detected.     ZEarlie Server MD 11/27/2019

## 2019-11-27 NOTE — Progress Notes (Signed)
Patient reports new numbness on bottom of feet.  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  Her leg pain is 2/10 today.

## 2019-12-04 ENCOUNTER — Inpatient Hospital Stay (HOSPITAL_BASED_OUTPATIENT_CLINIC_OR_DEPARTMENT_OTHER): Payer: BC Managed Care – PPO | Admitting: Oncology

## 2019-12-04 ENCOUNTER — Inpatient Hospital Stay: Payer: BC Managed Care – PPO

## 2019-12-04 ENCOUNTER — Encounter: Payer: Self-pay | Admitting: Oncology

## 2019-12-04 ENCOUNTER — Other Ambulatory Visit: Payer: Self-pay

## 2019-12-04 ENCOUNTER — Inpatient Hospital Stay: Payer: BC Managed Care – PPO | Attending: Oncology

## 2019-12-04 VITALS — BP 120/81 | HR 92 | Resp 18

## 2019-12-04 VITALS — BP 138/89 | HR 108 | Temp 97.4°F | Resp 18 | Wt 218.9 lb

## 2019-12-04 DIAGNOSIS — R5383 Other fatigue: Secondary | ICD-10-CM | POA: Diagnosis not present

## 2019-12-04 DIAGNOSIS — Z79899 Other long term (current) drug therapy: Secondary | ICD-10-CM | POA: Insufficient documentation

## 2019-12-04 DIAGNOSIS — C773 Secondary and unspecified malignant neoplasm of axilla and upper limb lymph nodes: Secondary | ICD-10-CM | POA: Diagnosis not present

## 2019-12-04 DIAGNOSIS — G620 Drug-induced polyneuropathy: Secondary | ICD-10-CM | POA: Insufficient documentation

## 2019-12-04 DIAGNOSIS — D649 Anemia, unspecified: Secondary | ICD-10-CM

## 2019-12-04 DIAGNOSIS — Z170 Estrogen receptor positive status [ER+]: Secondary | ICD-10-CM

## 2019-12-04 DIAGNOSIS — C50311 Malignant neoplasm of lower-inner quadrant of right female breast: Secondary | ICD-10-CM | POA: Insufficient documentation

## 2019-12-04 DIAGNOSIS — Z7952 Long term (current) use of systemic steroids: Secondary | ICD-10-CM | POA: Insufficient documentation

## 2019-12-04 DIAGNOSIS — D6481 Anemia due to antineoplastic chemotherapy: Secondary | ICD-10-CM | POA: Diagnosis not present

## 2019-12-04 DIAGNOSIS — Z7982 Long term (current) use of aspirin: Secondary | ICD-10-CM | POA: Insufficient documentation

## 2019-12-04 DIAGNOSIS — Z87442 Personal history of urinary calculi: Secondary | ICD-10-CM | POA: Diagnosis not present

## 2019-12-04 DIAGNOSIS — T451X5A Adverse effect of antineoplastic and immunosuppressive drugs, initial encounter: Secondary | ICD-10-CM | POA: Diagnosis not present

## 2019-12-04 DIAGNOSIS — D509 Iron deficiency anemia, unspecified: Secondary | ICD-10-CM | POA: Diagnosis not present

## 2019-12-04 DIAGNOSIS — Z5111 Encounter for antineoplastic chemotherapy: Secondary | ICD-10-CM

## 2019-12-04 DIAGNOSIS — G4700 Insomnia, unspecified: Secondary | ICD-10-CM

## 2019-12-04 DIAGNOSIS — E876 Hypokalemia: Secondary | ICD-10-CM

## 2019-12-04 DIAGNOSIS — C50919 Malignant neoplasm of unspecified site of unspecified female breast: Secondary | ICD-10-CM

## 2019-12-04 LAB — COMPREHENSIVE METABOLIC PANEL
ALT: 30 U/L (ref 0–44)
AST: 23 U/L (ref 15–41)
Albumin: 3.9 g/dL (ref 3.5–5.0)
Alkaline Phosphatase: 97 U/L (ref 38–126)
Anion gap: 9 (ref 5–15)
BUN: 8 mg/dL (ref 6–20)
CO2: 24 mmol/L (ref 22–32)
Calcium: 9 mg/dL (ref 8.9–10.3)
Chloride: 104 mmol/L (ref 98–111)
Creatinine, Ser: 0.83 mg/dL (ref 0.44–1.00)
GFR calc Af Amer: >60 mL/min (ref >60)
GFR calc non Af Amer: >60 mL/min (ref >60)
Glucose, Bld: 157 mg/dL — ABNORMAL HIGH (ref 70–99)
Potassium: 3.6 mmol/L (ref 3.5–5.1)
Sodium: 137 mmol/L (ref 135–145)
Total Bilirubin: 0.4 mg/dL (ref 0.3–1.2)
Total Protein: 7.1 g/dL (ref 6.5–8.1)

## 2019-12-04 LAB — CBC WITH DIFFERENTIAL/PLATELET
Abs Immature Granulocytes: 0.03 10*3/uL (ref 0.00–0.07)
Basophils Absolute: 0 10*3/uL (ref 0.0–0.1)
Basophils Relative: 1 %
Eosinophils Absolute: 0.3 10*3/uL (ref 0.0–0.5)
Eosinophils Relative: 5 %
HCT: 34.8 % — ABNORMAL LOW (ref 36.0–46.0)
Hemoglobin: 11 g/dL — ABNORMAL LOW (ref 12.0–15.0)
Immature Granulocytes: 1 %
Lymphocytes Relative: 26 %
Lymphs Abs: 1.6 10*3/uL (ref 0.7–4.0)
MCH: 29.2 pg (ref 26.0–34.0)
MCHC: 31.6 g/dL (ref 30.0–36.0)
MCV: 92.3 fL (ref 80.0–100.0)
Monocytes Absolute: 0.4 10*3/uL (ref 0.1–1.0)
Monocytes Relative: 6 %
Neutro Abs: 3.6 10*3/uL (ref 1.7–7.7)
Neutrophils Relative %: 61 %
Platelets: 402 10*3/uL — ABNORMAL HIGH (ref 150–400)
RBC: 3.77 MIL/uL — ABNORMAL LOW (ref 3.87–5.11)
RDW: 16.2 % — ABNORMAL HIGH (ref 11.5–15.5)
WBC: 5.9 10*3/uL (ref 4.0–10.5)
nRBC: 0 % (ref 0.0–0.2)

## 2019-12-04 LAB — MAGNESIUM: Magnesium: 1.8 mg/dL (ref 1.7–2.4)

## 2019-12-04 MED ORDER — HEPARIN SOD (PORK) LOCK FLUSH 100 UNIT/ML IV SOLN
500.0000 [IU] | Freq: Once | INTRAVENOUS | Status: AC
  Administered 2019-12-04: 12:00:00 500 [IU] via INTRAVENOUS
  Filled 2019-12-04: qty 5

## 2019-12-04 MED ORDER — SODIUM CHLORIDE 0.9 % IV SOLN
20.0000 mg | Freq: Once | INTRAVENOUS | Status: AC
  Administered 2019-12-04: 10:00:00 20 mg via INTRAVENOUS
  Filled 2019-12-04: qty 20

## 2019-12-04 MED ORDER — SODIUM CHLORIDE 0.9% FLUSH
10.0000 mL | Freq: Once | INTRAVENOUS | Status: AC
  Administered 2019-12-04: 08:00:00 10 mL via INTRAVENOUS
  Filled 2019-12-04: qty 10

## 2019-12-04 MED ORDER — HEPARIN SOD (PORK) LOCK FLUSH 100 UNIT/ML IV SOLN
INTRAVENOUS | Status: AC
  Filled 2019-12-04: qty 5

## 2019-12-04 MED ORDER — SODIUM CHLORIDE 0.9 % IV SOLN
Freq: Once | INTRAVENOUS | Status: AC
  Filled 2019-12-04: qty 250

## 2019-12-04 MED ORDER — DIPHENHYDRAMINE HCL 50 MG/ML IJ SOLN
50.0000 mg | Freq: Once | INTRAMUSCULAR | Status: AC
  Administered 2019-12-04: 09:00:00 50 mg via INTRAVENOUS
  Filled 2019-12-04: qty 1

## 2019-12-04 MED ORDER — FAMOTIDINE IN NACL 20-0.9 MG/50ML-% IV SOLN
20.0000 mg | Freq: Once | INTRAVENOUS | Status: AC
  Administered 2019-12-04: 09:00:00 20 mg via INTRAVENOUS
  Filled 2019-12-04: qty 50

## 2019-12-04 MED ORDER — SODIUM CHLORIDE 0.9 % IV SOLN
80.0000 mg/m2 | Freq: Once | INTRAVENOUS | Status: AC
  Administered 2019-12-04: 168 mg via INTRAVENOUS
  Filled 2019-12-04: qty 28

## 2019-12-04 MED ORDER — LORAZEPAM 2 MG/ML IJ SOLN
1.0000 mg | Freq: Once | INTRAMUSCULAR | Status: AC
  Administered 2019-12-04: 09:00:00 1 mg via INTRAVENOUS
  Filled 2019-12-04: qty 1

## 2019-12-04 NOTE — Progress Notes (Signed)
[place]   [phone] [phone]   Clinic Follow up Note   Patient Care Team: Ricardo Jericho, NP as PCP - General (Family Medicine) Rico Junker, RN as Registered Nurse  Date of Service:  12/04/2019  CHIEF COMPLAINT: F/u of right breast cancer   SUMMARY OF ONCOLOGIC HISTORY: Oncology History Overview Note  Cancer Staging Malignant neoplasm of lower-inner quadrant of right breast of female, estrogen receptor positive ([place]) Staging form: Breast, AJCC 8th Edition - Clinical stage from 07/26/2019: Stage IIB (cT2, cN1, cM0, G3, ER+, PR+, HER2-) - Unsigned    Malignant neoplasm of lower-inner quadrant of right breast of female, estrogen receptor positive ([place])  07/20/2019 Mammogram   Mammogram 07/20/19  IMPRESSION: Suspicious palpable right breast mass 3.4 x 2.5 x 3.4 cm 5 o'clock position 3 cm from nipple.   Suspicious 1.6cm cortically thickened right axillary lymph node.   07/26/2019 Initial Diagnosis   DIAGNOSIS: 07/26/19 A. RIGHT BREAST, 5:00, 3CMFN; ULTRASOUND-GUIDED NEEDLE CORE BIOPSY:  - INVASIVE MAMMARY CARCINOMA, NO SPECIAL TYPE.    07/26/2019 Receptors her2   BREAST BIOMARKER TESTS  Estrogen Receptor (ER) Status: POSITIVE                       Percentage of cells with nuclear positivity: 51-90%                       Average intensity of staining: Strong   Progesterone Receptor (PgR) Status: POSITIVE                       Percentage of cells with nuclear positivity:  Greater than 90%                       Average intensity of staining: Strong   HER2 (by immunohistochemistry): NEGATIVE (Score 1+)    08/05/2019 Initial Diagnosis   Invasive carcinoma of breast ([place])   08/17/2019 Surgery   INSERTION PORT-A-CATH by Dr. Lysle Pearl 08/17/19    08/18/2019 -  Chemotherapy   AC q2weeks for 4 cycles starting 08/18/19 followed by weekly Taxol for 12 weeks     Genetic Testing   Negative genetic testing. No pathogenic variants  identified on the [facility] Common Hereditary Cancers Panel. The report date is 11/13/2019.   The Common Hereditary Cancers Panel offered by [facility] includes sequencing and/or deletion duplication testing of the following 48 genes: APC, ATM, AXIN2, BARD1, BMPR1A, BRCA1, BRCA2, BRIP1, CDH1, CDKN2A (p14ARF), CDKN2A (p16INK4a), CKD4, CHEK2, CTNNA1, DICER1, EPCAM (Deletion/duplication testing only), GREM1 (promoter region deletion/duplication testing only), KIT, MEN1, MLH1, MSH2, MSH3, MSH6, MUTYH, NBN, NF1, NHTL1, PALB2, PDGFRA, PMS2, POLD1, POLE, PTEN, RAD50, RAD51C, RAD51D, RNF43, SDHB, SDHC, SDHD, SMAD4, SMARCA4. STK11, TP53, TSC1, TSC2, and VHL.  The following genes were evaluated for sequence changes only: SDHA and HOXB13 c.251G>A variant only.      CURRENT THERAPY:  Neoadjuvant chemo ddAC starting 08/18/19 for 4 cycles followed by weekly Taxol   INTERVAL HISTORY:  Jessica [name] presents to follow-up for evaluation prior to chemotherapy for breast cancer treatment. Patient has completed 4 cycles of dose dense Adriamycin and Cytoxan with scalp cooling system with Dr[facility]Feng at [facility]. Currently on weekly Taxol. She is being also using the scalp cooling system and tolerates well.  She has mild hair loss. Patient has intermittent episodes of pins-and-needles on the bottom of her feet and fingertips. Patient has been on gabapentin 300  mg 3 times a day. Reports the symptom is stable. Ativan prior to Paxil treatments helped her " brain freezing" symptoms. Sleep is better with Ambien QHS as needed.    Review of Systems  Constitutional: Positive for fatigue. Negative for appetite change, chills and fever.  HENT:   Negative for hearing loss and voice change.   Eyes: Negative for eye problems.  Respiratory: Negative for chest tightness and cough.   Cardiovascular: Negative for chest pain.  Gastrointestinal: Negative for abdominal distention, abdominal pain and blood in stool.  Endocrine:  Negative for hot flashes.  Genitourinary: Negative for difficulty urinating and frequency.   Musculoskeletal: Negative for arthralgias.  Skin: Negative for itching and rash.  Neurological: Positive for numbness. Negative for extremity weakness.  Hematological: Negative for adenopathy.  Psychiatric/Behavioral: Negative for confusion.    MEDICAL HISTORY:  Past Medical History:  Diagnosis Date  . Breast cancer ([place])    right breast  . Cancer ([place])    breast cancer  . Family history of cervical cancer   . Family history of lung cancer   . Frequency   . Kidney stones   . Obesity   . Personal history of chemotherapy   . Right flank pain     SURGICAL HISTORY: Past Surgical History:  Procedure Laterality Date  . BREAST BIOPSY    . CESAREAN SECTION     x 2  . CHOLECYSTECTOMY    . DILATION AND CURETTAGE OF UTERUS    . PORTACATH PLACEMENT Left 08/17/2019   Procedure: INSERTION PORT-A-CATH;  Surgeon: Benjamine Sprague, DO;  Location: [facility] ORS;  Service: General;  Laterality: Left;  . TUBAL LIGATION      I have reviewed the social history and family history with the patient and they are unchanged from previous note.  ALLERGIES:  has No Known Allergies.  MEDICATIONS:  Current Outpatient Medications  Medication Sig Dispense Refill  . Aspirin-Acetaminophen-Caffeine (GOODYS EXTRA STRENGTH PO) Take by mouth.    . ferrous sulfate 325 (65 FE) MG EC tablet TAKE 1 TABLET (325 MG TOTAL) BY MOUTH 2 (TWO) TIMES DAILY WITH A MEAL. 60 tablet 1  . gabapentin (NEURONTIN) 600 MG tablet Take 600 mg by mouth 3 (three) times daily.    Marland Kitchen lidocaine-prilocaine (EMLA) cream Apply to affected area once 30 g 3  . omeprazole (PRILOSEC) 20 MG capsule TAKE 1 CAPSULE BY MOUTH EVERY DAY 90 capsule 0  . prochlorperazine (COMPAZINE) 10 MG tablet Take 1 tablet (10 mg total) by mouth every 6 (six) hours as needed (Nausea or vomiting). 30 tablet 1  . zolpidem (AMBIEN) 5 MG tablet Take 1 tablet (5 mg total) by mouth at  bedtime as needed for sleep. 30 tablet 0   No current facility-administered medications for this visit.   Facility-Administered Medications Ordered in Other Visits  Medication Dose Route Frequency Provider Last Rate Last Admin  . dexamethasone (DECADRON) 20 mg in sodium chloride 0.9 % 50 mL IVPB  20 mg Intravenous Once Earlie Server, MD      . heparin lock flush 100 unit/mL  500 Units Intravenous Once Earlie Server, MD      . PACLitaxel (TAXOL) 168 mg in sodium chloride 0.9 % 250 mL chemo infusion (</= 61m/m2)  80 mg/m2 (Treatment Plan Recorded) Intravenous Once YEarlie Server MD        PHYSICAL EXAMINATION: ECOG PERFORMANCE STATUS: 0 - Asymptomatic  Vitals:   12/04/19 0838  BP: 138/89  Pulse: (!) 108  Resp: 18  Temp: (Marland Kitchen  97.4 F (36.3 C)   Filed Weights   12/04/19 0838  Weight: 218 lb 14.4 oz (99.3 kg)   Physical Exam  Constitutional: She is oriented to person, place, and time. No distress.  HENT:  Head: Normocephalic and atraumatic.  Mouth/Throat: No oropharyngeal exudate.  Eyes: Pupils are equal, round, and reactive to light. EOM are normal. No scleral icterus.  Cardiovascular: Normal rate and regular rhythm.  No murmur heard. Pulmonary/Chest: Effort normal. No respiratory distress.  Abdominal: Soft. She exhibits no distension. There is no abdominal tenderness.  Musculoskeletal:        General: No edema. Normal range of motion.     Cervical back: Normal range of motion and neck supple.  Neurological: She is alert and oriented to person, place, and time. She exhibits normal muscle tone.  Skin: Skin is warm and dry. She is not diaphoretic. No erythema.  Psychiatric: Affect normal.     LABORATORY DATA:  I have reviewed the data as listed CBC Latest Ref Rng & Units 12/04/2019 11/27/2019 11/20/2019  WBC 4.0 - 10.5 K/uL 5.9 5.7 6.1  Hemoglobin 12.0 - 15.0 g/dL 11.0(L) 10.5(L) 10.3(L)  Hematocrit 36.0 - 46.0 % 34.8(L) 33.2(L) 32.7(L)  Platelets 150 - 400 K/uL 402(H) 371 347     CMP  Latest Ref Rng & Units 12/04/2019 11/27/2019 11/20/2019  Glucose 70 - 99 mg/dL 157(H) 144(H) 192(H)  BUN 6 - 20 mg/dL '8 11 11  ' Creatinine 0.44 - 1.00 mg/dL 0.83 0.71 0.79  Sodium 135 - 145 mmol/L 137 137 136  Potassium 3.5 - 5.1 mmol/L 3.6 3.5 3.6  Chloride 98 - 111 mmol/L 104 103 103  CO2 22 - 32 mmol/L '24 24 24  ' Calcium 8.9 - 10.3 mg/dL 9.0 8.8(L) 8.7(L)  Total Protein 6.5 - 8.1 g/dL 7.1 6.8 6.8  Total Bilirubin 0.3 - 1.2 mg/dL 0.4 0.6 0.5  Alkaline Phos 38 - 126 U/L 97 91 96  AST 15 - 41 U/L '23 22 20  ' ALT 0 - 44 U/L '30 28 22      ' RADIOGRAPHIC STUDIES: I have personally reviewed the radiological images as listed and agreed with the findings in the report. US Breast Limited Uni Right Inc Axilla  Result Date: 10/02/2019 CLINICAL DATA:  44 year old female with biopsy proven invasive mammary carcinoma of the right breast and metastatic axillary adenopathy. Assess response to neoadjuvant chemotherapy. EXAM: DIGITAL DIAGNOSTIC RIGHT MAMMOGRAM WITH CAD AND TOMO ULTRASOUND RIGHT BREAST COMPARISON:  Previous exam(s). ACR Breast Density Category b: There are scattered areas of fibroglandular density. FINDINGS: There is a 2.1 cm mass in the lower inner quadrant of the right breast. There is a heart shaped clip located in the mass. There is an enlarged right axillary lymph node with a HydroMARK clip. No additional mass is seen in the right breast. There are no malignant type microcalcifications. Mammographic images were processed with CAD. Targeted ultrasound is performed, showing there is an irregular hypoechoic mass in the right breast at 5 o'clock 3 cm from the nipple measuring 2.8 x 1.6 x 2.8 cm. There is a solitary enlarged axillary lymph node measuring 3.3 x 1.5 cm (cortical thickening 1.5 cm). On the prior ultrasound dated 07/20/2019 the mass in the right breast at 5 o'clock 3 cm from the nipple measured 3.4 x 2.4 x 3.4 cm and the lymph node cortical thickening measured 1.6 cm. IMPRESSION: Interval  reduction in the size of the mass in the 5 o'clock region of the right breast now measuring 2.8  cm versus 3.3 cm on the prior exam. The axillary lymph node is relatively unchanged. RECOMMENDATION: Continued treatment planning of the known right breast invasive mammary carcinoma and metastatic axillary adenopathy is recommended. I have discussed the findings and recommendations with the patient. If applicable, a reminder letter will be sent to the patient regarding the next appointment. BI-RADS CATEGORY  6: Known biopsy-proven malignancy. Electronically Signed   By: Lillia Mountain M.D.   On: 10/02/2019 15:29   MM DIAG BREAST TOMO UNI RIGHT  Result Date: 10/02/2019 CLINICAL DATA:  44 year old female with biopsy proven invasive mammary carcinoma of the right breast and metastatic axillary adenopathy. Assess response to neoadjuvant chemotherapy. EXAM: DIGITAL DIAGNOSTIC RIGHT MAMMOGRAM WITH CAD AND TOMO ULTRASOUND RIGHT BREAST COMPARISON:  Previous exam(s). ACR Breast Density Category b: There are scattered areas of fibroglandular density. FINDINGS: There is a 2.1 cm mass in the lower inner quadrant of the right breast. There is a heart shaped clip located in the mass. There is an enlarged right axillary lymph node with a HydroMARK clip. No additional mass is seen in the right breast. There are no malignant type microcalcifications. Mammographic images were processed with CAD. Targeted ultrasound is performed, showing there is an irregular hypoechoic mass in the right breast at 5 o'clock 3 cm from the nipple measuring 2.8 x 1.6 x 2.8 cm. There is a solitary enlarged axillary lymph node measuring 3.3 x 1.5 cm (cortical thickening 1.5 cm). On the prior ultrasound dated 07/20/2019 the mass in the right breast at 5 o'clock 3 cm from the nipple measured 3.4 x 2.4 x 3.4 cm and the lymph node cortical thickening measured 1.6 cm. IMPRESSION: Interval reduction in the size of the mass in the 5 o'clock region of the right  breast now measuring 2.8 cm versus 3.3 cm on the prior exam. The axillary lymph node is relatively unchanged. RECOMMENDATION: Continued treatment planning of the known right breast invasive mammary carcinoma and metastatic axillary adenopathy is recommended. I have discussed the findings and recommendations with the patient. If applicable, a reminder letter will be sent to the patient regarding the next appointment. BI-RADS CATEGORY  6: Known biopsy-proven malignancy. Electronically Signed   By: Lillia Mountain M.D.   On: 10/02/2019 15:29     ASSESSMENT & PLAN:  1. Malignant neoplasm of lower-inner quadrant of right breast of female, estrogen receptor positive ([place])   2. Encounter for antineoplastic chemotherapy   3. Neuropathy due to chemotherapeutic drug ([place])   4. Normocytic anemia   5. Insomnia, unspecified type     Invasive right breast cancer,  Cancer Staging Malignant neoplasm of lower-inner quadrant of right breast of female, estrogen receptor positive ([place]) Staging form: Breast, AJCC 8th Edition - Clinical stage from 07/26/2019: Stage IIB (cT2, cN1, cM0, G3, ER+, PR+, HER2-) - Unsigned -Status post 4 cycles of dose dense Adriamycin and Cytoxan.   -Patient is currently on weekly Taxol with scalp cooling system. Overall she tolerates well with mild difficulties. Labs are reviewed and discussed with patient.  Stable counts. Acceptable to proceed with today's Taxol treatments. She uses D[employer] system.  Ativan 1 mg prior to each treatments to help ease her symptoms.   #Neuropathy secondary to chemotherapy, intermittent, grade 1. Continue gabapentin 300 mg 3 times daily.  #Anemia, multifactorial, from chemotherapy as well as iron deficiency.  Stable hemoglobin.  Monitor.  #Hypokalemia has resolved.  Continue to monitor. #Insomnia, likely secondary to chemotherapy/steroids.  Continue Ambien 59m QHS PRN.  Follow-up in 1  week for assessment for next cycle of Taxol.  All questions were  answered. The patient knows to call the clinic with any problems, questions or concerns. No barriers to learning was detected.     Earlie Server, MD 12/04/2019

## 2019-12-04 NOTE — Progress Notes (Signed)
Patient's numbness in feet has not worsened.  The Ativan given at last treatment helped with the "brain freeze" from [place].  She is sleeping better.

## 2019-12-05 ENCOUNTER — Encounter: Payer: Self-pay | Admitting: Oncology

## 2019-12-11 ENCOUNTER — Inpatient Hospital Stay (HOSPITAL_BASED_OUTPATIENT_CLINIC_OR_DEPARTMENT_OTHER): Payer: BC Managed Care – PPO | Admitting: Oncology

## 2019-12-11 ENCOUNTER — Inpatient Hospital Stay: Payer: BC Managed Care – PPO

## 2019-12-11 ENCOUNTER — Encounter: Payer: Self-pay | Admitting: Oncology

## 2019-12-11 ENCOUNTER — Other Ambulatory Visit: Payer: Self-pay

## 2019-12-11 VITALS — BP 134/88 | HR 98 | Temp 97.6°F | Resp 18 | Wt 220.9 lb

## 2019-12-11 DIAGNOSIS — G4700 Insomnia, unspecified: Secondary | ICD-10-CM

## 2019-12-11 DIAGNOSIS — Z170 Estrogen receptor positive status [ER+]: Secondary | ICD-10-CM

## 2019-12-11 DIAGNOSIS — D649 Anemia, unspecified: Secondary | ICD-10-CM

## 2019-12-11 DIAGNOSIS — Z5111 Encounter for antineoplastic chemotherapy: Secondary | ICD-10-CM

## 2019-12-11 DIAGNOSIS — C50311 Malignant neoplasm of lower-inner quadrant of right female breast: Secondary | ICD-10-CM | POA: Diagnosis not present

## 2019-12-11 DIAGNOSIS — C50919 Malignant neoplasm of unspecified site of unspecified female breast: Secondary | ICD-10-CM

## 2019-12-11 DIAGNOSIS — T451X5A Adverse effect of antineoplastic and immunosuppressive drugs, initial encounter: Secondary | ICD-10-CM

## 2019-12-11 DIAGNOSIS — G620 Drug-induced polyneuropathy: Secondary | ICD-10-CM

## 2019-12-11 DIAGNOSIS — Z95828 Presence of other vascular implants and grafts: Secondary | ICD-10-CM

## 2019-12-11 DIAGNOSIS — E876 Hypokalemia: Secondary | ICD-10-CM

## 2019-12-11 LAB — COMPREHENSIVE METABOLIC PANEL
ALT: 28 U/L (ref 0–44)
AST: 21 U/L (ref 15–41)
Albumin: 3.8 g/dL (ref 3.5–5.0)
Alkaline Phosphatase: 90 U/L (ref 38–126)
Anion gap: 9 (ref 5–15)
BUN: 11 mg/dL (ref 6–20)
CO2: 25 mmol/L (ref 22–32)
Calcium: 8.9 mg/dL (ref 8.9–10.3)
Chloride: 103 mmol/L (ref 98–111)
Creatinine, Ser: 0.72 mg/dL (ref 0.44–1.00)
GFR calc Af Amer: >60 mL/min (ref >60)
GFR calc non Af Amer: >60 mL/min (ref >60)
Glucose, Bld: 154 mg/dL — ABNORMAL HIGH (ref 70–99)
Potassium: 3.9 mmol/L (ref 3.5–5.1)
Sodium: 137 mmol/L (ref 135–145)
Total Bilirubin: 0.5 mg/dL (ref 0.3–1.2)
Total Protein: 6.9 g/dL (ref 6.5–8.1)

## 2019-12-11 LAB — CBC WITH DIFFERENTIAL/PLATELET
Abs Immature Granulocytes: 0.05 10*3/uL (ref 0.00–0.07)
Basophils Absolute: 0.1 10*3/uL (ref 0.0–0.1)
Basophils Relative: 1 %
Eosinophils Absolute: 0.2 10*3/uL (ref 0.0–0.5)
Eosinophils Relative: 3 %
HCT: 34.2 % — ABNORMAL LOW (ref 36.0–46.0)
Hemoglobin: 10.6 g/dL — ABNORMAL LOW (ref 12.0–15.0)
Immature Granulocytes: 1 %
Lymphocytes Relative: 22 %
Lymphs Abs: 1.3 10*3/uL (ref 0.7–4.0)
MCH: 29 pg (ref 26.0–34.0)
MCHC: 31 g/dL (ref 30.0–36.0)
MCV: 93.7 fL (ref 80.0–100.0)
Monocytes Absolute: 0.4 10*3/uL (ref 0.1–1.0)
Monocytes Relative: 7 %
Neutro Abs: 3.9 10*3/uL (ref 1.7–7.7)
Neutrophils Relative %: 66 %
Platelets: 362 10*3/uL (ref 150–400)
RBC: 3.65 MIL/uL — ABNORMAL LOW (ref 3.87–5.11)
RDW: 16.2 % — ABNORMAL HIGH (ref 11.5–15.5)
WBC: 6 10*3/uL (ref 4.0–10.5)
nRBC: 0 % (ref 0.0–0.2)

## 2019-12-11 LAB — MAGNESIUM: Magnesium: 2 mg/dL (ref 1.7–2.4)

## 2019-12-11 MED ORDER — FAMOTIDINE IN NACL 20-0.9 MG/50ML-% IV SOLN
20.0000 mg | Freq: Once | INTRAVENOUS | Status: AC
  Administered 2019-12-11: 20 mg via INTRAVENOUS
  Filled 2019-12-11: qty 50

## 2019-12-11 MED ORDER — SODIUM CHLORIDE 0.9 % IV SOLN
Freq: Once | INTRAVENOUS | Status: AC
  Filled 2019-12-11: qty 250

## 2019-12-11 MED ORDER — ZOLPIDEM TARTRATE ER 6.25 MG PO TBCR: 6.2500 mg | EXTENDED_RELEASE_TABLET | Freq: Every evening | ORAL | 0 refills | Status: DC | PRN

## 2019-12-11 MED ORDER — SODIUM CHLORIDE 0.9% FLUSH
10.0000 mL | Freq: Once | INTRAVENOUS | Status: DC
  Filled 2019-12-11: qty 10

## 2019-12-11 MED ORDER — LORAZEPAM 2 MG/ML IJ SOLN
1.0000 mg | Freq: Once | INTRAMUSCULAR | Status: AC
  Administered 2019-12-11: 1 mg via INTRAVENOUS
  Filled 2019-12-11: qty 1

## 2019-12-11 MED ORDER — DIPHENHYDRAMINE HCL 50 MG/ML IJ SOLN
50.0000 mg | Freq: Once | INTRAMUSCULAR | Status: AC
  Administered 2019-12-11: 50 mg via INTRAVENOUS
  Filled 2019-12-11: qty 1

## 2019-12-11 MED ORDER — SODIUM CHLORIDE 0.9 % IV SOLN
70.0000 mg/m2 | Freq: Once | INTRAVENOUS | Status: AC
  Administered 2019-12-11: 150 mg via INTRAVENOUS
  Filled 2019-12-11: qty 25

## 2019-12-11 MED ORDER — HEPARIN SOD (PORK) LOCK FLUSH 100 UNIT/ML IV SOLN
500.0000 [IU] | Freq: Once | INTRAVENOUS | Status: AC | PRN
  Administered 2019-12-11: 500 [IU]
  Filled 2019-12-11: qty 5

## 2019-12-11 MED ORDER — SODIUM CHLORIDE 0.9 % IV SOLN
20.0000 mg | Freq: Once | INTRAVENOUS | Status: AC
  Administered 2019-12-11: 20 mg via INTRAVENOUS
  Filled 2019-12-11: qty 2

## 2019-12-11 NOTE — Progress Notes (Signed)
[place]   [phone] Fax:([phone]   Clinic Follow up Note   Patient Care Team: Ricardo Jericho, NP as PCP - General (Family Medicine) Rico Junker, RN as Registered Nurse  Date of Service:  12/11/2019  CHIEF COMPLAINT: F/u of right breast cancer   SUMMARY OF ONCOLOGIC HISTORY: Oncology History Overview Note  Cancer Staging Malignant neoplasm of lower-inner quadrant of right breast of female, estrogen receptor positive ([place]) Staging form: Breast, AJCC 8th Edition - Clinical stage from 07/26/2019: Stage IIB (cT2, cN1, cM0, G3, ER+, PR+, HER2-) - Unsigned    Malignant neoplasm of lower-inner quadrant of right breast of female, estrogen receptor positive ([place])  07/20/2019 Mammogram   Mammogram 07/20/19  IMPRESSION: Suspicious palpable right breast mass 3.4 x 2.5 x 3.4 cm 5 o'clock position 3 cm from nipple.   Suspicious 1.6cm cortically thickened right axillary lymph node.   07/26/2019 Initial Diagnosis   DIAGNOSIS: 07/26/19 A. RIGHT BREAST, 5:00, 3CMFN; ULTRASOUND-GUIDED NEEDLE CORE BIOPSY:  - INVASIVE MAMMARY CARCINOMA, NO SPECIAL TYPE.    07/26/2019 Receptors her2   BREAST BIOMARKER TESTS  Estrogen Receptor (ER) Status: POSITIVE                       Percentage of cells with nuclear positivity: 51-90%                       Average intensity of staining: Strong   Progesterone Receptor (PgR) Status: POSITIVE                       Percentage of cells with nuclear positivity:  Greater than 90%                       Average intensity of staining: Strong   HER2 (by immunohistochemistry): NEGATIVE (Score 1+)    08/05/2019 Initial Diagnosis   Invasive carcinoma of breast ([place])   08/17/2019 Surgery   INSERTION PORT-A-CATH by Dr. Lysle Pearl 08/17/19    08/18/2019 -  Chemotherapy   AC q2weeks for 4 cycles starting 08/18/19 followed by weekly Taxol for 12 weeks     Genetic Testing   Negative genetic testing. No pathogenic variants  identified on the [facility] Common Hereditary Cancers Panel. The report date is 11/13/2019.   The Common Hereditary Cancers Panel offered by [facility] includes sequencing and/or deletion duplication testing of the following 48 genes: APC, ATM, AXIN2, BARD1, BMPR1A, BRCA1, BRCA2, BRIP1, CDH1, CDKN2A (p14ARF), CDKN2A (p16INK4a), CKD4, CHEK2, CTNNA1, DICER1, EPCAM (Deletion/duplication testing only), GREM1 (promoter region deletion/duplication testing only), KIT, MEN1, MLH1, MSH2, MSH3, MSH6, MUTYH, NBN, NF1, NHTL1, PALB2, PDGFRA, PMS2, POLD1, POLE, PTEN, RAD50, RAD51C, RAD51D, RNF43, SDHB, SDHC, SDHD, SMAD4, SMARCA4. STK11, TP53, TSC1, TSC2, and VHL.  The following genes were evaluated for sequence changes only: SDHA and HOXB13 c.251G>A variant only.      CURRENT THERAPY:  Neoadjuvant chemo ddAC starting 08/18/19 for 4 cycles followed by weekly Taxol   INTERVAL HISTORY:  SOUNDRA LAMPLEY presents to follow-up for evaluation prior to chemotherapy for breast cancer treatment. Patient has completed 4 cycles of dose dense Adriamycin and Cytoxan with scalp cooling system with Dr[facility]Feng at [facility]. Currently on weekly Taxol.  She is being also using the scalp cooling system and tolerates well.  She has mild hair loss. Chronic intermitted pins and needles sensation of the bottom of the feet and fingertips.  Take gabapentin 329m 3  times daily.  Symptoms are stable.  Ativan prior to Paxil treatments helped her " brain freezing" symptoms. Main concern is insomnia. She reports 2-3 days of insominia after chemotherapy.  She takes Ambien 7.69m and was able to fall asleep but sleep only lasts about 5 hours and she can not go back to sleep.     Review of Systems  Constitutional: Positive for fatigue. Negative for appetite change, chills and fever.  HENT:   Negative for hearing loss and voice change.   Eyes: Negative for eye problems.  Respiratory: Negative for chest tightness and cough.   Cardiovascular:  Negative for chest pain.  Gastrointestinal: Negative for abdominal distention, abdominal pain and blood in stool.  Endocrine: Negative for hot flashes.  Genitourinary: Negative for difficulty urinating and frequency.   Musculoskeletal: Negative for arthralgias.  Skin: Negative for itching and rash.  Neurological: Positive for numbness. Negative for extremity weakness.  Hematological: Negative for adenopathy.  Psychiatric/Behavioral: Positive for sleep disturbance. Negative for confusion.    MEDICAL HISTORY:  Past Medical History:  Diagnosis Date  . [place]    [place] breast  . [place]    breast cancer  . Family history of cervical cancer   . Family history of lung cancer   . Frequency   . Kidney stones   . Obesity   . Personal history of chemotherapy   . Right flank pain     SURGICAL HISTORY: Past Surgical History:  Procedure Laterality Date  . BREAST BIOPSY    . CESAREAN SECTION     x 2  . CHOLECYSTECTOMY    . DILATION AND CURETTAGE OF UTERUS    . PORTACATH PLACEMENT Left 08/17/2019   Procedure: INSERTION PORT-A-CATH;  Surgeon: SBenjamine Sprague DO;  Location: [facility] ORS;  Service: General;  Laterality: Left;  . TUBAL LIGATION      I have reviewed the social history and family history with the patient and they are unchanged from previous note.  ALLERGIES:  has No Known Allergies.  MEDICATIONS:  Current Outpatient Medications  Medication Sig Dispense Refill  . Aspirin-Acetaminophen-Caffeine (GOODYS EXTRA STRENGTH PO) Take by mouth.    . ferrous sulfate 325 (65 FE) MG EC tablet TAKE 1 TABLET (325 MG TOTAL) BY MOUTH 2 (TWO) TIMES DAILY WITH A MEAL. 60 tablet 1  . gabapentin (NEURONTIN) 600 MG tablet Take 600 mg by mouth 3 (three) times daily.    .Marland Kitchenlidocaine-prilocaine (EMLA) cream Apply to affected area once 30 g 3  . omeprazole (PRILOSEC) 20 MG capsule TAKE 1 CAPSULE BY MOUTH EVERY DAY 90 capsule 0  . prochlorperazine (COMPAZINE) 10 MG tablet Take 1 tablet  (10 mg total) by mouth every 6 (six) hours as needed (Nausea or vomiting). 30 tablet 1  . zolpidem (AMBIEN CR) 6.25 MG CR tablet Take 1 tablet (6.25 mg total) by mouth at bedtime as needed for sleep. 3 tablet 0   No current facility-administered medications for this visit.   Facility-Administered Medications Ordered in Other Visits  Medication Dose Route Frequency Provider Last Rate Last Admin  . sodium chloride flush (NS) 0.9 % injection 10 mL  10 mL Intravenous Once YEarlie Server MD        PHYSICAL EXAMINATION: ECOG PERFORMANCE STATUS: 0 - Asymptomatic  Vitals:   12/11/19 0849  BP: 134/88  Pulse: 98  Resp: 18  Temp: 97.6 F (36.4 C)   Filed Weights   12/11/19 0849  Weight: 220 lb 14.4 oz (100.2 kg)  Physical Exam  Constitutional: She is oriented to person, place, and time. No distress.  HENT:  Head: Normocephalic and atraumatic.  Mouth/Throat: No oropharyngeal exudate.  Eyes: Pupils are equal, round, and reactive to light. EOM are normal. No scleral icterus.  Cardiovascular: Normal rate and regular rhythm.  No murmur heard. Pulmonary/Chest: Effort normal. No respiratory distress.  Abdominal: Soft. She exhibits no distension. There is no abdominal tenderness.  Musculoskeletal:        General: No edema. Normal range of motion.     Cervical back: Normal range of motion and neck supple.  Neurological: She is alert and oriented to person, place, and time. She exhibits normal muscle tone.  Skin: Skin is warm and dry. She is not diaphoretic. No erythema.  Psychiatric: Affect normal.     LABORATORY DATA:  I have reviewed the data as listed CBC Latest Ref Rng & Units 12/11/2019 12/04/2019 11/27/2019  WBC 4.0 - 10.5 K/uL 6.0 5.9 5.7  Hemoglobin 12.0 - 15.0 g/dL 10.6(L) 11.0(L) 10.5(L)  Hematocrit 36.0 - 46.0 % 34.2(L) 34.8(L) 33.2(L)  Platelets 150 - 400 K/uL 362 402(H) 371     CMP Latest Ref Rng & Units 12/11/2019 12/04/2019 11/27/2019  Glucose 70 - 99 mg/dL 154(H) 157(H) 144(H)    BUN 6 - 20 mg/dL '11 8 11  ' Creatinine 0.44 - 1.00 mg/dL 0.72 0.83 0.71  Sodium 135 - 145 mmol/L 137 137 137  Potassium 3.5 - 5.1 mmol/L 3.9 3.6 3.5  Chloride 98 - 111 mmol/L 103 104 103  CO2 22 - 32 mmol/L '25 24 24  ' Calcium 8.9 - 10.3 mg/dL 8.9 9.0 8.8(L)  Total Protein 6.5 - 8.1 g/dL 6.9 7.1 6.8  Total Bilirubin 0.3 - 1.2 mg/dL 0.5 0.4 0.6  Alkaline Phos 38 - 126 U/L 90 97 91  AST 15 - 41 U/L '21 23 22  ' ALT 0 - 44 U/L '28 30 28      ' RADIOGRAPHIC STUDIES: I have personally reviewed the radiological images as listed and agreed with the findings in the report. US Breast Limited Uni Right Inc Axilla  Result Date: 10/02/2019 CLINICAL DATA:  44 year old female with biopsy proven invasive mammary carcinoma of the right breast and metastatic axillary adenopathy. Assess response to neoadjuvant chemotherapy. EXAM: DIGITAL DIAGNOSTIC RIGHT MAMMOGRAM WITH CAD AND TOMO ULTRASOUND RIGHT BREAST COMPARISON:  Previous exam(s). ACR Breast Density Category b: There are scattered areas of fibroglandular density. FINDINGS: There is a 2.1 cm mass in the lower inner quadrant of the right breast. There is a heart shaped clip located in the mass. There is an enlarged right axillary lymph node with a HydroMARK clip. No additional mass is seen in the right breast. There are no malignant type microcalcifications. Mammographic images were processed with CAD. Targeted ultrasound is performed, showing there is an irregular hypoechoic mass in the right breast at 5 o'clock 3 cm from the nipple measuring 2.8 x 1.6 x 2.8 cm. There is a solitary enlarged axillary lymph node measuring 3.3 x 1.5 cm (cortical thickening 1.5 cm). On the prior ultrasound dated 07/20/2019 the mass in the right breast at 5 o'clock 3 cm from the nipple measured 3.4 x 2.4 x 3.4 cm and the lymph node cortical thickening measured 1.6 cm. IMPRESSION: Interval reduction in the size of the mass in the 5 o'clock region of the right breast now measuring 2.8 cm  versus 3.3 cm on the prior exam. The axillary lymph node is relatively unchanged. RECOMMENDATION: Continued treatment planning of the  known right breast invasive mammary carcinoma and metastatic axillary adenopathy is recommended. I have discussed the findings and recommendations with the patient. If applicable, a reminder letter will be sent to the patient regarding the next appointment. BI-RADS CATEGORY  6: Known biopsy-proven malignancy. Electronically Signed   By: Lillia Mountain M.D.   On: 10/02/2019 15:29   MM DIAG BREAST TOMO UNI RIGHT  Result Date: 10/02/2019 CLINICAL DATA:  44 year old female with biopsy proven invasive mammary carcinoma of the right breast and metastatic axillary adenopathy. Assess response to neoadjuvant chemotherapy. EXAM: DIGITAL DIAGNOSTIC RIGHT MAMMOGRAM WITH CAD AND TOMO ULTRASOUND RIGHT BREAST COMPARISON:  Previous exam(s). ACR Breast Density Category b: There are scattered areas of fibroglandular density. FINDINGS: There is a 2.1 cm mass in the lower inner quadrant of the right breast. There is a heart shaped clip located in the mass. There is an enlarged right axillary lymph node with a HydroMARK clip. No additional mass is seen in the right breast. There are no malignant type microcalcifications. Mammographic images were processed with CAD. Targeted ultrasound is performed, showing there is an irregular hypoechoic mass in the right breast at 5 o'clock 3 cm from the nipple measuring 2.8 x 1.6 x 2.8 cm. There is a solitary enlarged axillary lymph node measuring 3.3 x 1.5 cm (cortical thickening 1.5 cm). On the prior ultrasound dated 07/20/2019 the mass in the right breast at 5 o'clock 3 cm from the nipple measured 3.4 x 2.4 x 3.4 cm and the lymph node cortical thickening measured 1.6 cm. IMPRESSION: Interval reduction in the size of the mass in the 5 o'clock region of the right breast now measuring 2.8 cm versus 3.3 cm on the prior exam. The axillary lymph node is relatively  unchanged. RECOMMENDATION: Continued treatment planning of the known right breast invasive mammary carcinoma and metastatic axillary adenopathy is recommended. I have discussed the findings and recommendations with the patient. If applicable, a reminder letter will be sent to the patient regarding the next appointment. BI-RADS CATEGORY  6: Known biopsy-proven malignancy. Electronically Signed   By: Lillia Mountain M.D.   On: 10/02/2019 15:29     ASSESSMENT & PLAN:  1. Malignant neoplasm of lower-inner quadrant of right breast of female, estrogen receptor positive ([place])   2. Encounter for antineoplastic chemotherapy   3. Insomnia, unspecified type   4. Neuropathy due to chemotherapeutic drug ([place])     Invasive right breast cancer,  Cancer Staging Malignant neoplasm of lower-inner quadrant of right breast of female, estrogen receptor positive ([place]) Staging form: Breast, AJCC 8th Edition - Clinical stage from 07/26/2019: Stage IIB (cT2, cN1, cM0, G3, ER+, PR+, HER2-) - Unsigned -Status post 4 cycles of dose dense Adriamycin and Cytoxan.   -Patient is currently on weekly Taxol with scalp cooling system. Overall she tolerates well with mild difficulties. Labs are reviewed and discussed with patient. Counts are acceptable to proceed with weekly Taxol today. Given her neuropathy I will decrease Taxol to 60m/m2.  She uses Dignicap system.  Ativan 1 mg prior to each treatments to help ease her symptoms.   #Neuropathy secondary to chemotherapy, intermittent, grade 1. decrease Taxol to 741mm2. Continue gabapentin 300 mg 3 times daily.  #Anemia, multifactorial, from chemotherapy as well as iron deficiency.  Stable. Monitor.   #Insomnia, likely secondary to chemotherapy/steroids. Her main issue is sleep maintenance. Will switch to Ambien CR 6.2583mHS.   Follow-up in 1 week for assessment for next cycle of Taxol.  All questions were answered.  The patient knows to call the clinic with any problems,  questions or concerns. No barriers to learning was detected.     Earlie Server, MD 12/11/2019

## 2019-12-11 NOTE — Progress Notes (Signed)
Patient would like MD to check her port site.  The Ambien 7.5 mg did not help her stay asleep during the night.  Reports neuropathy is the same with occasional discomfort.  Has a new rash on buttocks area that is itches and burns.

## 2019-12-18 ENCOUNTER — Inpatient Hospital Stay: Payer: BC Managed Care – PPO

## 2019-12-18 ENCOUNTER — Other Ambulatory Visit: Payer: Self-pay

## 2019-12-18 ENCOUNTER — Encounter: Payer: Self-pay | Admitting: Oncology

## 2019-12-18 ENCOUNTER — Inpatient Hospital Stay (HOSPITAL_BASED_OUTPATIENT_CLINIC_OR_DEPARTMENT_OTHER): Payer: BC Managed Care – PPO | Admitting: Oncology

## 2019-12-18 VITALS — BP 120/81 | HR 108 | Temp 97.6°F | Resp 16 | Wt 222.1 lb

## 2019-12-18 VITALS — HR 100

## 2019-12-18 DIAGNOSIS — D649 Anemia, unspecified: Secondary | ICD-10-CM

## 2019-12-18 DIAGNOSIS — C50311 Malignant neoplasm of lower-inner quadrant of right female breast: Secondary | ICD-10-CM

## 2019-12-18 DIAGNOSIS — G4700 Insomnia, unspecified: Secondary | ICD-10-CM | POA: Diagnosis not present

## 2019-12-18 DIAGNOSIS — T451X5A Adverse effect of antineoplastic and immunosuppressive drugs, initial encounter: Secondary | ICD-10-CM

## 2019-12-18 DIAGNOSIS — G620 Drug-induced polyneuropathy: Secondary | ICD-10-CM | POA: Diagnosis not present

## 2019-12-18 DIAGNOSIS — Z170 Estrogen receptor positive status [ER+]: Secondary | ICD-10-CM

## 2019-12-18 DIAGNOSIS — C50919 Malignant neoplasm of unspecified site of unspecified female breast: Secondary | ICD-10-CM

## 2019-12-18 DIAGNOSIS — Z5111 Encounter for antineoplastic chemotherapy: Secondary | ICD-10-CM

## 2019-12-18 LAB — COMPREHENSIVE METABOLIC PANEL
ALT: 28 U/L (ref 0–44)
AST: 23 U/L (ref 15–41)
Albumin: 3.8 g/dL (ref 3.5–5.0)
Alkaline Phosphatase: 80 U/L (ref 38–126)
Anion gap: 9 (ref 5–15)
BUN: 11 mg/dL (ref 6–20)
CO2: 26 mmol/L (ref 22–32)
Calcium: 8.9 mg/dL (ref 8.9–10.3)
Chloride: 103 mmol/L (ref 98–111)
Creatinine, Ser: 0.83 mg/dL (ref 0.44–1.00)
GFR calc Af Amer: >60 mL/min (ref >60)
GFR calc non Af Amer: >60 mL/min (ref >60)
Glucose, Bld: 152 mg/dL — ABNORMAL HIGH (ref 70–99)
Potassium: 3.8 mmol/L (ref 3.5–5.1)
Sodium: 138 mmol/L (ref 135–145)
Total Bilirubin: 0.6 mg/dL (ref 0.3–1.2)
Total Protein: 6.8 g/dL (ref 6.5–8.1)

## 2019-12-18 LAB — CBC WITH DIFFERENTIAL/PLATELET
Abs Immature Granulocytes: 0.03 10*3/uL (ref 0.00–0.07)
Basophils Absolute: 0 10*3/uL (ref 0.0–0.1)
Basophils Relative: 1 %
Eosinophils Absolute: 0.2 10*3/uL (ref 0.0–0.5)
Eosinophils Relative: 3 %
HCT: 34.6 % — ABNORMAL LOW (ref 36.0–46.0)
Hemoglobin: 10.8 g/dL — ABNORMAL LOW (ref 12.0–15.0)
Immature Granulocytes: 1 %
Lymphocytes Relative: 24 %
Lymphs Abs: 1.4 10*3/uL (ref 0.7–4.0)
MCH: 29.1 pg (ref 26.0–34.0)
MCHC: 31.2 g/dL (ref 30.0–36.0)
MCV: 93.3 fL (ref 80.0–100.0)
Monocytes Absolute: 0.4 10*3/uL (ref 0.1–1.0)
Monocytes Relative: 7 %
Neutro Abs: 4 10*3/uL (ref 1.7–7.7)
Neutrophils Relative %: 64 %
Platelets: 372 10*3/uL (ref 150–400)
RBC: 3.71 MIL/uL — ABNORMAL LOW (ref 3.87–5.11)
RDW: 15.9 % — ABNORMAL HIGH (ref 11.5–15.5)
WBC: 6 10*3/uL (ref 4.0–10.5)
nRBC: 0 % (ref 0.0–0.2)

## 2019-12-18 MED ORDER — FAMOTIDINE IN NACL 20-0.9 MG/50ML-% IV SOLN
20.0000 mg | Freq: Once | INTRAVENOUS | Status: AC
  Administered 2019-12-18: 20 mg via INTRAVENOUS
  Filled 2019-12-18: qty 50

## 2019-12-18 MED ORDER — DIPHENHYDRAMINE HCL 50 MG/ML IJ SOLN
50.0000 mg | Freq: Once | INTRAMUSCULAR | Status: AC
  Administered 2019-12-18: 50 mg via INTRAVENOUS
  Filled 2019-12-18: qty 1

## 2019-12-18 MED ORDER — LORAZEPAM 2 MG/ML IJ SOLN
1.0000 mg | Freq: Once | INTRAMUSCULAR | Status: AC
  Administered 2019-12-18: 1 mg via INTRAVENOUS
  Filled 2019-12-18: qty 1

## 2019-12-18 MED ORDER — SODIUM CHLORIDE 0.9 % IV SOLN
20.0000 mg | Freq: Once | INTRAVENOUS | Status: AC
  Administered 2019-12-18: 20 mg via INTRAVENOUS
  Filled 2019-12-18: qty 20

## 2019-12-18 MED ORDER — HEPARIN SOD (PORK) LOCK FLUSH 100 UNIT/ML IV SOLN
500.0000 [IU] | Freq: Once | INTRAVENOUS | Status: AC | PRN
  Administered 2019-12-18: 500 [IU]
  Filled 2019-12-18: qty 5

## 2019-12-18 MED ORDER — HEPARIN SOD (PORK) LOCK FLUSH 100 UNIT/ML IV SOLN
INTRAVENOUS | Status: AC
  Filled 2019-12-18: qty 5

## 2019-12-18 MED ORDER — SODIUM CHLORIDE 0.9 % IV SOLN
70.0000 mg/m2 | Freq: Once | INTRAVENOUS | Status: AC
  Administered 2019-12-18: 150 mg via INTRAVENOUS
  Filled 2019-12-18: qty 25

## 2019-12-18 MED ORDER — SODIUM CHLORIDE 0.9 % IV SOLN
Freq: Once | INTRAVENOUS | Status: AC
  Filled 2019-12-18: qty 250

## 2019-12-18 MED ORDER — SODIUM CHLORIDE 0.9% FLUSH
10.0000 mL | Freq: Once | INTRAVENOUS | Status: AC
  Administered 2019-12-18: 10 mL via INTRAVENOUS
  Filled 2019-12-18: qty 10

## 2019-12-18 NOTE — Progress Notes (Signed)
Jessica [name]   [phone] [phone]   Clinic Follow up Note   Patient Care Team: Ricardo Jericho, NP as PCP - General (Family Medicine) Rico Junker, RN as Registered Nurse  Date of Service:  12/18/2019  CHIEF COMPLAINT: F/u of right breast cancer   SUMMARY OF ONCOLOGIC HISTORY: Oncology History Overview Note  Cancer Staging Malignant neoplasm of lower-inner quadrant of right breast of female, estrogen receptor positive ([place]) Staging form: Breast, AJCC 8th Edition - Clinical stage from 07/26/2019: Stage IIB (cT2, cN1, cM0, G3, ER+, PR+, HER2-) - Unsigned    Malignant neoplasm of lower-inner quadrant of right breast of female, estrogen receptor positive ([place])  07/20/2019 Mammogram   Mammogram 07/20/19  IMPRESSION: Suspicious palpable right breast mass 3.4 x 2.5 x 3.4 cm 5 o'clock position 3 cm from nipple.   Suspicious 1.6cm cortically thickened right axillary lymph node.   07/26/2019 Initial Diagnosis   DIAGNOSIS: 07/26/19 A. RIGHT BREAST, 5:00, 3CMFN; ULTRASOUND-GUIDED NEEDLE CORE BIOPSY:  - INVASIVE MAMMARY CARCINOMA, NO SPECIAL TYPE.    07/26/2019 Receptors her2   BREAST BIOMARKER TESTS  Estrogen Receptor (ER) Status: POSITIVE                       Percentage of cells with nuclear positivity: 51-90%                       Average intensity of staining: Strong   Progesterone Receptor (PgR) Status: POSITIVE                       Percentage of cells with nuclear positivity:  Greater than 90%                       Average intensity of staining: Strong   HER2 (by immunohistochemistry): NEGATIVE (Score 1+)    08/05/2019 Initial Diagnosis   Invasive carcinoma of breast ([place])   08/17/2019 Surgery   INSERTION PORT-A-CATH by Dr. Lysle Pearl 08/17/19    08/18/2019 -  Chemotherapy   AC q2weeks for 4 cycles starting 08/18/19 followed by weekly Taxol for 12 weeks     Genetic Testing   Negative genetic testing. No pathogenic variants  identified on the [facility] Common Hereditary Cancers Panel. The report date is 11/13/2019.   The Common Hereditary Cancers Panel offered by [facility] includes sequencing and/or deletion duplication testing of the following 48 genes: APC, ATM, AXIN2, BARD1, BMPR1A, BRCA1, BRCA2, BRIP1, CDH1, CDKN2A (p14ARF), CDKN2A (p16INK4a), CKD4, CHEK2, CTNNA1, DICER1, EPCAM (Deletion/duplication testing only), GREM1 (promoter region deletion/duplication testing only), KIT, MEN1, MLH1, MSH2, MSH3, MSH6, MUTYH, NBN, NF1, NHTL1, PALB2, PDGFRA, PMS2, POLD1, POLE, PTEN, RAD50, RAD51C, RAD51D, RNF43, SDHB, SDHC, SDHD, SMAD4, SMARCA4. STK11, TP53, TSC1, TSC2, and VHL.  The following genes were evaluated for sequence changes only: SDHA and HOXB13 c.251G>A variant only.      CURRENT THERAPY:  Neoadjuvant chemo ddAC starting 08/18/19 for 4 cycles followed by weekly Taxol   INTERVAL HISTORY:  Jessica [name] presents to follow-up for evaluation prior to chemotherapy for breast cancer treatment. Patient has completed 4 cycles of dose dense Adriamycin and Cytoxan with scalp cooling system with Dr.Feng at [facility] Currently on weekly Taxol.  Overall patient tolerates chemotherapy well with mild to moderate difficulties. Neuropathy, patient takes gabapentin for pre-existing sciatic pain.  She reports having intermittent bilateral lower extremity numbness and tingling. She gets Ativan prior to the Taxol treatments  and Ativan helps her " brain freezing" symptoms from scalp cooling system  #Insomnia, reports 2 to 3 days of insomnia after chemotherapy. Ambien has been switched to long-acting and patient took 1 dose last week which helped her sleepy.  But it did not help her the second time she uses it. She is currently not using She was seen recently by surgery for some concern of her Mediport.     Review of Systems  Constitutional: Positive for fatigue. Negative for appetite change, chills and fever.  HENT:   Negative  for hearing loss and voice change.   Eyes: Negative for eye problems.  Respiratory: Negative for chest tightness and cough.   Cardiovascular: Negative for chest pain.  Gastrointestinal: Negative for abdominal distention, abdominal pain and blood in stool.  Endocrine: Negative for hot flashes.  Genitourinary: Negative for difficulty urinating and frequency.   Musculoskeletal: Negative for arthralgias.  Skin: Negative for itching and rash.  Neurological: Positive for numbness. Negative for extremity weakness.  Hematological: Negative for adenopathy.  Psychiatric/Behavioral: Positive for sleep disturbance. Negative for confusion.    MEDICAL HISTORY:  Past Medical History:  Diagnosis Date  . Breast cancer ([place])    right breast  . Cancer ([place])    breast cancer  . Family history of cervical cancer   . Family history of lung cancer   . Frequency   . Kidney stones   . Obesity   . Personal history of chemotherapy   . Right flank pain     SURGICAL HISTORY: Past Surgical History:  Procedure Laterality Date  . BREAST BIOPSY    . CESAREAN SECTION     x 2  . CHOLECYSTECTOMY    . DILATION AND CURETTAGE OF UTERUS    . PORTACATH PLACEMENT Left 08/17/2019   Procedure: INSERTION PORT-A-CATH;  Surgeon: Benjamine Sprague, DO;  Location: [facility] ORS;  Service: General;  Laterality: Left;  . TUBAL LIGATION      I have reviewed the social history and family history with the patient and they are unchanged from previous note.  ALLERGIES:  has No Known Allergies.  MEDICATIONS:  Current Outpatient Medications  Medication Sig Dispense Refill  . Aspirin-Acetaminophen-Caffeine (GOODYS EXTRA STRENGTH PO) Take by mouth.    . ferrous sulfate 325 (65 FE) MG EC tablet TAKE 1 TABLET (325 MG TOTAL) BY MOUTH 2 (TWO) TIMES DAILY WITH A MEAL. 60 tablet 1  . gabapentin (NEURONTIN) 600 MG tablet Take 600 mg by mouth 3 (three) times daily.    Marland Kitchen lidocaine-prilocaine (EMLA) cream Apply to affected area once 30 g 3    . omeprazole (PRILOSEC) 20 MG capsule TAKE 1 CAPSULE BY MOUTH EVERY DAY 90 capsule 0  . prochlorperazine (COMPAZINE) 10 MG tablet Take 1 tablet (10 mg total) by mouth every 6 (six) hours as needed (Nausea or vomiting). 30 tablet 1  . zolpidem (AMBIEN CR) 6.25 MG CR tablet Take 1 tablet (6.25 mg total) by mouth at bedtime as needed for sleep. (Patient not taking: Reported on 12/18/2019) 3 tablet 0   No current facility-administered medications for this visit.   Facility-Administered Medications Ordered in Other Visits  Medication Dose Route Frequency Provider Last Rate Last Admin  . sodium chloride flush (NS) 0.9 % injection 10 mL  10 mL Intravenous Once Earlie Server, MD        PHYSICAL EXAMINATION: ECOG PERFORMANCE STATUS: 0 - Asymptomatic  Vitals:   12/18/19 0845  BP: 120/81  Pulse: (!) 108  Resp: 16  Temp:  97.6 F (36.4 C)   Filed Weights   12/18/19 0845  Weight: 222 lb 1.6 oz (100.7 kg)   Physical Exam  Constitutional: She is oriented to person, place, and time. No distress.  HENT:  Head: Normocephalic and atraumatic.  Nose: Nose normal.  Mouth/Throat: Oropharynx is clear and moist. No oropharyngeal exudate.  Eyes: Pupils are equal, round, and reactive to light. EOM are normal. No scleral icterus.  Cardiovascular: Normal rate and regular rhythm.  No murmur heard. Pulmonary/Chest: Effort normal. No respiratory distress. She has no rales. She exhibits no tenderness.  Abdominal: Soft. She exhibits no distension. There is no abdominal tenderness.  Musculoskeletal:        General: No edema. Normal range of motion.     Cervical back: Normal range of motion and neck supple.  Neurological: She is alert and oriented to person, place, and time. No cranial nerve deficit. She exhibits normal muscle tone. Coordination normal.  Skin: Skin is warm and dry. She is not diaphoretic. No erythema.  Psychiatric: Affect normal.     LABORATORY DATA:  I have reviewed the data as listed CBC  Latest Ref Rng & Units 12/18/2019 12/11/2019 12/04/2019  WBC 4.0 - 10.5 K/uL 6.0 6.0 5.9  Hemoglobin 12.0 - 15.0 g/dL 10.8(L) 10.6(L) 11.0(L)  Hematocrit 36.0 - 46.0 % 34.6(L) 34.2(L) 34.8(L)  Platelets 150 - 400 K/uL 372 362 402(H)     CMP Latest Ref Rng & Units 12/18/2019 12/11/2019 12/04/2019  Glucose 70 - 99 mg/dL 152(H) 154(H) 157(H)  BUN 6 - 20 mg/dL '11 11 8  ' Creatinine 0.44 - 1.00 mg/dL 0.83 0.72 0.83  Sodium 135 - 145 mmol/L 138 137 137  Potassium 3.5 - 5.1 mmol/L 3.8 3.9 3.6  Chloride 98 - 111 mmol/L 103 103 104  CO2 22 - 32 mmol/L '26 25 24  ' Calcium 8.9 - 10.3 mg/dL 8.9 8.9 9.0  Total Protein 6.5 - 8.1 g/dL 6.8 6.9 7.1  Total Bilirubin 0.3 - 1.2 mg/dL 0.6 0.5 0.4  Alkaline Phos 38 - 126 U/L 80 90 97  AST 15 - 41 U/L '23 21 23  ' ALT 0 - 44 U/L '28 28 30      ' RADIOGRAPHIC STUDIES: I have personally reviewed the radiological images as listed and agreed with the findings in the report. US Breast Limited Uni Right Inc Axilla  Result Date: 10/02/2019 CLINICAL DATA:  44 year old female with biopsy proven invasive mammary carcinoma of the right breast and metastatic axillary adenopathy. Assess response to neoadjuvant chemotherapy. EXAM: DIGITAL DIAGNOSTIC RIGHT MAMMOGRAM WITH CAD AND TOMO ULTRASOUND RIGHT BREAST COMPARISON:  Previous exam(s). ACR Breast Density Category b: There are scattered areas of fibroglandular density. FINDINGS: There is a 2.1 cm mass in the lower inner quadrant of the right breast. There is a heart shaped clip located in the mass. There is an enlarged right axillary lymph node with a HydroMARK clip. No additional mass is seen in the right breast. There are no malignant type microcalcifications. Mammographic images were processed with CAD. Targeted ultrasound is performed, showing there is an irregular hypoechoic mass in the right breast at 5 o'clock 3 cm from the nipple measuring 2.8 x 1.6 x 2.8 cm. There is a solitary enlarged axillary lymph node measuring 3.3 x 1.5 cm  (cortical thickening 1.5 cm). On the prior ultrasound dated 07/20/2019 the mass in the right breast at 5 o'clock 3 cm from the nipple measured 3.4 x 2.4 x 3.4 cm and the lymph node cortical thickening measured  1.6 cm. IMPRESSION: Interval reduction in the size of the mass in the 5 o'clock region of the right breast now measuring 2.8 cm versus 3.3 cm on the prior exam. The axillary lymph node is relatively unchanged. RECOMMENDATION: Continued treatment planning of the known right breast invasive mammary carcinoma and metastatic axillary adenopathy is recommended. I have discussed the findings and recommendations with the patient. If applicable, a reminder letter will be sent to the patient regarding the next appointment. BI-RADS CATEGORY  6: Known biopsy-proven malignancy. Electronically Signed   By: Lillia Mountain M.D.   On: 10/02/2019 15:29   MM DIAG BREAST TOMO UNI RIGHT  Result Date: 10/02/2019 CLINICAL DATA:  44 year old female with biopsy proven invasive mammary carcinoma of the right breast and metastatic axillary adenopathy. Assess response to neoadjuvant chemotherapy. EXAM: DIGITAL DIAGNOSTIC RIGHT MAMMOGRAM WITH CAD AND TOMO ULTRASOUND RIGHT BREAST COMPARISON:  Previous exam(s). ACR Breast Density Category b: There are scattered areas of fibroglandular density. FINDINGS: There is a 2.1 cm mass in the lower inner quadrant of the right breast. There is a heart shaped clip located in the mass. There is an enlarged right axillary lymph node with a HydroMARK clip. No additional mass is seen in the right breast. There are no malignant type microcalcifications. Mammographic images were processed with CAD. Targeted ultrasound is performed, showing there is an irregular hypoechoic mass in the right breast at 5 o'clock 3 cm from the nipple measuring 2.8 x 1.6 x 2.8 cm. There is a solitary enlarged axillary lymph node measuring 3.3 x 1.5 cm (cortical thickening 1.5 cm). On the prior ultrasound dated 07/20/2019 the  mass in the right breast at 5 o'clock 3 cm from the nipple measured 3.4 x 2.4 x 3.4 cm and the lymph node cortical thickening measured 1.6 cm. IMPRESSION: Interval reduction in the size of the mass in the 5 o'clock region of the right breast now measuring 2.8 cm versus 3.3 cm on the prior exam. The axillary lymph node is relatively unchanged. RECOMMENDATION: Continued treatment planning of the known right breast invasive mammary carcinoma and metastatic axillary adenopathy is recommended. I have discussed the findings and recommendations with the patient. If applicable, a reminder letter will be sent to the patient regarding the next appointment. BI-RADS CATEGORY  6: Known biopsy-proven malignancy. Electronically Signed   By: Lillia Mountain M.D.   On: 10/02/2019 15:29     ASSESSMENT & PLAN:  1. Malignant neoplasm of lower-inner quadrant of right breast of female, estrogen receptor positive ([place])   2. Encounter for antineoplastic chemotherapy   3. Insomnia, unspecified type   4. Neuropathy due to chemotherapeutic drug ([place])   5. Normocytic anemia     Invasive right breast cancer,  Cancer Staging Malignant neoplasm of lower-inner quadrant of right breast of female, estrogen receptor positive ([place]) Staging form: Breast, AJCC 8th Edition - Clinical stage from 07/26/2019: Stage IIB (cT2, cN1, cM0, G3, ER+, PR+, HER2-) - Unsigned -Status post 4 cycles of dose dense Adriamycin and Cytoxan.   -Patient is currently on weekly Taxol with scalp cooling system. Overall she tolerates well with mild difficulties. Labs are reviewed and discussed with patient. Counts acceptable to proceed with weekly Taxol.  Given her neuropathy, Taxol has been decreased to 70 mg/m. She uses scalp cooling system.  Receives Ativan 1 mg prior to each treatments to ease her symptoms.  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 Continue gabapentin 300 mg 3 times daily.  #Anemia secondary to chemotherapy,  continue to monitor.  Stable. #Insomnia, I recommend patient to try the Ambien CR 6.25 mg QHS few more times if still no improvemen.,  Plan to switch to Ativan.  Follow-up in 1 week for assessment for next cycle of Taxol.  All questions were answered. The patient knows to call the clinic with any problems, questions or concerns. No barriers to learning was detected.     Earlie Server, MD 12/18/2019

## 2019-12-18 NOTE — Progress Notes (Signed)
Patient reports she is now having occasional burning sensation on the bottoms of her feet.  She tried taking the Ambien twice and the first night she slept good but did not help her sleep the 2nd night.

## 2019-12-25 ENCOUNTER — Other Ambulatory Visit: Payer: Self-pay

## 2019-12-25 ENCOUNTER — Inpatient Hospital Stay (HOSPITAL_BASED_OUTPATIENT_CLINIC_OR_DEPARTMENT_OTHER): Payer: BC Managed Care – PPO | Admitting: Oncology

## 2019-12-25 ENCOUNTER — Inpatient Hospital Stay: Payer: BC Managed Care – PPO

## 2019-12-25 ENCOUNTER — Encounter: Payer: Self-pay | Admitting: Oncology

## 2019-12-25 VITALS — BP 138/85 | HR 99 | Temp 97.5°F | Resp 18 | Wt 226.1 lb

## 2019-12-25 DIAGNOSIS — D649 Anemia, unspecified: Secondary | ICD-10-CM | POA: Diagnosis not present

## 2019-12-25 DIAGNOSIS — C50311 Malignant neoplasm of lower-inner quadrant of right female breast: Secondary | ICD-10-CM

## 2019-12-25 DIAGNOSIS — Z5111 Encounter for antineoplastic chemotherapy: Secondary | ICD-10-CM | POA: Diagnosis not present

## 2019-12-25 DIAGNOSIS — G620 Drug-induced polyneuropathy: Secondary | ICD-10-CM

## 2019-12-25 DIAGNOSIS — C50919 Malignant neoplasm of unspecified site of unspecified female breast: Secondary | ICD-10-CM

## 2019-12-25 DIAGNOSIS — Z170 Estrogen receptor positive status [ER+]: Secondary | ICD-10-CM

## 2019-12-25 DIAGNOSIS — Z95828 Presence of other vascular implants and grafts: Secondary | ICD-10-CM

## 2019-12-25 DIAGNOSIS — G4700 Insomnia, unspecified: Secondary | ICD-10-CM

## 2019-12-25 DIAGNOSIS — T451X5A Adverse effect of antineoplastic and immunosuppressive drugs, initial encounter: Secondary | ICD-10-CM

## 2019-12-25 LAB — CBC WITH DIFFERENTIAL/PLATELET
Abs Immature Granulocytes: 0.04 10*3/uL (ref 0.00–0.07)
Basophils Absolute: 0.1 10*3/uL (ref 0.0–0.1)
Basophils Relative: 1 %
Eosinophils Absolute: 0.2 10*3/uL (ref 0.0–0.5)
Eosinophils Relative: 4 %
HCT: 32.6 % — ABNORMAL LOW (ref 36.0–46.0)
Hemoglobin: 10.5 g/dL — ABNORMAL LOW (ref 12.0–15.0)
Immature Granulocytes: 1 %
Lymphocytes Relative: 21 %
Lymphs Abs: 1.2 10*3/uL (ref 0.7–4.0)
MCH: 29.9 pg (ref 26.0–34.0)
MCHC: 32.2 g/dL (ref 30.0–36.0)
MCV: 92.9 fL (ref 80.0–100.0)
Monocytes Absolute: 0.5 10*3/uL (ref 0.1–1.0)
Monocytes Relative: 9 %
Neutro Abs: 3.7 10*3/uL (ref 1.7–7.7)
Neutrophils Relative %: 64 %
Platelets: 343 10*3/uL (ref 150–400)
RBC: 3.51 MIL/uL — ABNORMAL LOW (ref 3.87–5.11)
RDW: 15.9 % — ABNORMAL HIGH (ref 11.5–15.5)
WBC: 5.6 10*3/uL (ref 4.0–10.5)
nRBC: 0 % (ref 0.0–0.2)

## 2019-12-25 LAB — COMPREHENSIVE METABOLIC PANEL
ALT: 28 U/L (ref 0–44)
AST: 23 U/L (ref 15–41)
Albumin: 3.6 g/dL (ref 3.5–5.0)
Alkaline Phosphatase: 84 U/L (ref 38–126)
Anion gap: 9 (ref 5–15)
BUN: 11 mg/dL (ref 6–20)
CO2: 25 mmol/L (ref 22–32)
Calcium: 8.8 mg/dL — ABNORMAL LOW (ref 8.9–10.3)
Chloride: 104 mmol/L (ref 98–111)
Creatinine, Ser: 0.64 mg/dL (ref 0.44–1.00)
GFR calc Af Amer: >60 mL/min (ref >60)
GFR calc non Af Amer: >60 mL/min (ref >60)
Glucose, Bld: 149 mg/dL — ABNORMAL HIGH (ref 70–99)
Potassium: 3.8 mmol/L (ref 3.5–5.1)
Sodium: 138 mmol/L (ref 135–145)
Total Bilirubin: 0.4 mg/dL (ref 0.3–1.2)
Total Protein: 6.7 g/dL (ref 6.5–8.1)

## 2019-12-25 MED ORDER — SODIUM CHLORIDE 0.9 % IV SOLN
70.0000 mg/m2 | Freq: Once | INTRAVENOUS | Status: AC
  Administered 2019-12-25: 150 mg via INTRAVENOUS
  Filled 2019-12-25: qty 25

## 2019-12-25 MED ORDER — LORAZEPAM 1 MG PO TABS: 1.0000 mg | ORAL_TABLET | Freq: Every evening | ORAL | 0 refills | Status: DC | PRN

## 2019-12-25 MED ORDER — SODIUM CHLORIDE 0.9 % IV SOLN
Freq: Once | INTRAVENOUS | Status: AC
  Filled 2019-12-25: qty 250

## 2019-12-25 MED ORDER — LORAZEPAM 2 MG/ML IJ SOLN
1.0000 mg | Freq: Once | INTRAMUSCULAR | Status: AC
  Administered 2019-12-25: 09:00:00 1 mg via INTRAVENOUS
  Filled 2019-12-25: qty 1

## 2019-12-25 MED ORDER — FAMOTIDINE IN NACL 20-0.9 MG/50ML-% IV SOLN
20.0000 mg | Freq: Once | INTRAVENOUS | Status: AC
  Administered 2019-12-25: 20 mg via INTRAVENOUS
  Filled 2019-12-25: qty 50

## 2019-12-25 MED ORDER — SODIUM CHLORIDE 0.9 % IV SOLN
20.0000 mg | Freq: Once | INTRAVENOUS | Status: AC
  Administered 2019-12-25: 10:00:00 20 mg via INTRAVENOUS
  Filled 2019-12-25: qty 20

## 2019-12-25 MED ORDER — SODIUM CHLORIDE 0.9% FLUSH
10.0000 mL | Freq: Once | INTRAVENOUS | Status: AC
  Administered 2019-12-25: 10 mL via INTRAVENOUS
  Filled 2019-12-25: qty 10

## 2019-12-25 MED ORDER — HEPARIN SOD (PORK) LOCK FLUSH 100 UNIT/ML IV SOLN
500.0000 [IU] | Freq: Once | INTRAVENOUS | Status: AC | PRN
  Administered 2019-12-25: 500 [IU]
  Filled 2019-12-25: qty 5

## 2019-12-25 MED ORDER — DIPHENHYDRAMINE HCL 50 MG/ML IJ SOLN
50.0000 mg | Freq: Once | INTRAMUSCULAR | Status: AC
  Administered 2019-12-25: 50 mg via INTRAVENOUS
  Filled 2019-12-25: qty 1

## 2019-12-25 NOTE — Progress Notes (Signed)
[facility]   [phone] Fax:([phone]) [phone]   Clinic Follow up Note   Patient Care Team: Ricardo Jericho, NP as PCP - General (Family Medicine) Rico Junker, RN as Registered Nurse  Date of Service:  12/25/2019  CHIEF COMPLAINT: F/u of right breast cancer   SUMMARY OF ONCOLOGIC HISTORY: Oncology History Overview Note  Cancer Staging Malignant neoplasm of lower-inner quadrant of right breast of female, estrogen receptor positive ([place]) Staging form: Breast, AJCC 8th Edition - Clinical stage from 07/26/2019: Stage IIB (cT2, cN1, cM0, G3, ER+, PR+, HER2-) - Unsigned    Malignant neoplasm of lower-inner quadrant of right breast of female, estrogen receptor positive ([place])  07/20/2019 Mammogram   Mammogram 07/20/19  IMPRESSION: Suspicious palpable right breast mass 3.4 x 2.5 x 3.4 cm 5 o'clock position 3 cm from nipple.   Suspicious 1.6cm cortically thickened right axillary lymph node.   07/26/2019 Initial Diagnosis   DIAGNOSIS: 07/26/19 A. RIGHT BREAST, 5:00, 3CMFN; ULTRASOUND-GUIDED NEEDLE CORE BIOPSY:  - INVASIVE MAMMARY CARCINOMA, NO SPECIAL TYPE.    07/26/2019 Receptors her2   BREAST BIOMARKER TESTS  Estrogen Receptor (ER) Status: POSITIVE                       Percentage of cells with nuclear positivity: 51-90%                       Average intensity of staining: Strong   Progesterone Receptor (PgR) Status: POSITIVE                       Percentage of cells with nuclear positivity:  Greater than 90%                       Average intensity of staining: Strong   HER2 (by immunohistochemistry): NEGATIVE (Score 1+)    08/05/2019 Initial Diagnosis   Invasive carcinoma of breast ([place])   08/17/2019 Surgery   INSERTION PORT-A-CATH by Dr. Lysle Pearl 08/17/19    08/18/2019 -  Chemotherapy   AC q2weeks for 4 cycles starting 08/18/19 followed by weekly Taxol for 12 weeks     Genetic Testing   Negative genetic testing. No pathogenic variants  identified on the [facility] Common Hereditary Cancers Panel. The report date is 11/13/2019.   The Common Hereditary Cancers Panel offered by [facility] includes sequencing and/or deletion duplication testing of the following 48 genes: APC, ATM, AXIN2, BARD1, BMPR1A, BRCA1, BRCA2, BRIP1, CDH1, CDKN2A (p14ARF), CDKN2A (p16INK4a), CKD4, CHEK2, CTNNA1, DICER1, EPCAM (Deletion/duplication testing only), GREM1 (promoter region deletion/duplication testing only), KIT, MEN1, MLH1, MSH2, MSH3, MSH6, MUTYH, NBN, NF1, NHTL1, PALB2, PDGFRA, PMS2, POLD1, POLE, PTEN, RAD50, RAD51C, RAD51D, RNF43, SDHB, SDHC, SDHD, SMAD4, SMARCA4. STK11, TP53, TSC1, TSC2, and VHL.  The following genes were evaluated for sequence changes only: SDHA and HOXB13 c.251G>A variant only.      CURRENT THERAPY:  Neoadjuvant chemo ddAC starting 08/18/19 for 4 cycles followed by weekly Taxol   INTERVAL HISTORY:  Jessica [name] presents to follow-up for evaluation prior to chemotherapy for breast cancer treatment. Patient has completed 4 cycles of dose dense Adriamycin and Cytoxan with scalp cooling system with Dr[facility]Feng at [facility]. Currently on weekly Taxol.  Overall she tolerates well with mild difficulties. Neuropathy, she takes gabapentin 600 mg 3 times daily.  She also has pre-existing sciatica. Intermittent bilateral lower extremity numbness and tingling.  Neuropathy symptoms are stable. She gets Ativan  prior to the Taxol treatments to help her " brain freezing" symptoms from scalp cooling system Insomnia, usually 2 to 3 days of insomnia after chemo.  She has tried short acting and long-acting of Ambien which did not help her. Request to switch to stronger sleep medication No other new complaints.   Review of Systems  Constitutional: Positive for fatigue. Negative for appetite change, chills and fever.  HENT:   Negative for hearing loss and voice change.   Eyes: Negative for eye problems.  Respiratory: Negative for chest  tightness and cough.   Cardiovascular: Negative for chest pain.  Gastrointestinal: Negative for abdominal distention, abdominal pain and blood in stool.  Endocrine: Negative for hot flashes.  Genitourinary: Negative for difficulty urinating and frequency.   Musculoskeletal: Negative for arthralgias.  Skin: Negative for itching and rash.  Neurological: Positive for numbness. Negative for extremity weakness.  Hematological: Negative for adenopathy.  Psychiatric/Behavioral: Positive for sleep disturbance. Negative for confusion.    MEDICAL HISTORY:  Past Medical History:  Diagnosis Date  . Breast cancer ([place])    right breast  . Cancer ([place])    breast cancer  . Family history of cervical cancer   . Family history of lung cancer   . Frequency   . Kidney stones   . Obesity   . Personal history of chemotherapy   . Right flank pain     SURGICAL HISTORY: Past Surgical History:  Procedure Laterality Date  . BREAST BIOPSY    . CESAREAN SECTION     x 2  . CHOLECYSTECTOMY    . DILATION AND CURETTAGE OF UTERUS    . PORTACATH PLACEMENT Left 08/17/2019   Procedure: INSERTION PORT-A-CATH;  Surgeon: Benjamine Sprague, DO;  Location: [facility] ORS;  Service: General;  Laterality: Left;  . TUBAL LIGATION      I have reviewed the social history and family history with the patient and they are unchanged from previous note.  ALLERGIES:  has No Known Allergies.  MEDICATIONS:  Current Outpatient Medications  Medication Sig Dispense Refill  . Aspirin-Acetaminophen-Caffeine (GOODYS EXTRA STRENGTH PO) Take by mouth.    . ferrous sulfate 325 (65 FE) MG EC tablet TAKE 1 TABLET (325 MG TOTAL) BY MOUTH 2 (TWO) TIMES DAILY WITH A MEAL. 60 tablet 1  . gabapentin (NEURONTIN) 600 MG tablet Take 600 mg by mouth 3 (three) times daily.    Marland Kitchen lidocaine-prilocaine (EMLA) cream Apply to affected area once 30 g 3  . omeprazole (PRILOSEC) 20 MG capsule TAKE 1 CAPSULE BY MOUTH EVERY DAY 90 capsule 0  . prochlorperazine  (COMPAZINE) 10 MG tablet Take 1 tablet (10 mg total) by mouth every 6 (six) hours as needed (Nausea or vomiting). 30 tablet 1  . zolpidem (AMBIEN CR) 6.25 MG CR tablet Take 1 tablet (6.25 mg total) by mouth at bedtime as needed for sleep. 3 tablet 0   No current facility-administered medications for this visit.   Facility-Administered Medications Ordered in Other Visits  Medication Dose Route Frequency Provider Last Rate Last Admin  . sodium chloride flush (NS) 0.9 % injection 10 mL  10 mL Intravenous Once Earlie Server, MD        PHYSICAL EXAMINATION: ECOG PERFORMANCE STATUS: 0 - Asymptomatic  Vitals:   12/25/19 0832  BP: 138/85  Pulse: 99  Resp: 18  Temp: (!) 97.5 F (36.4 C)   Filed Weights   12/25/19 0832  Weight: 226 lb 1.6 oz (102.6 kg)   Physical Exam  Constitutional: She  is oriented to person, place, and time. No distress.  HENT:  Head: Normocephalic and atraumatic.  Nose: Nose normal.  Mouth/Throat: Oropharynx is clear and moist. No oropharyngeal exudate.  Eyes: Pupils are equal, round, and reactive to light. EOM are normal. No scleral icterus.  Cardiovascular: Normal rate and regular rhythm.  No murmur heard. Pulmonary/Chest: Effort normal. No respiratory distress. She has no rales. She exhibits no tenderness.  Abdominal: Soft. She exhibits no distension. There is no abdominal tenderness.  Musculoskeletal:        General: No edema. Normal range of motion.     Cervical back: Normal range of motion and neck supple.  Neurological: She is alert and oriented to person, place, and time.  Skin: Skin is warm and dry. She is not diaphoretic. No erythema.  Psychiatric: Affect normal.     LABORATORY DATA:  I have reviewed the data as listed CBC Latest Ref Rng & Units 12/25/2019 12/18/2019 12/11/2019  WBC 4.0 - 10.5 K/uL 5.6 6.0 6.0  Hemoglobin 12.0 - 15.0 g/dL 10.5(L) 10.8(L) 10.6(L)  Hematocrit 36.0 - 46.0 % 32.6(L) 34.6(L) 34.2(L)  Platelets 150 - 400 K/uL 343 372 362      CMP Latest Ref Rng & Units 12/25/2019 12/18/2019 12/11/2019  Glucose 70 - 99 mg/dL 149(H) 152(H) 154(H)  BUN 6 - 20 mg/dL '11 11 11  ' Creatinine 0.44 - 1.00 mg/dL 0.64 0.83 0.72  Sodium 135 - 145 mmol/L 138 138 137  Potassium 3.5 - 5.1 mmol/L 3.8 3.8 3.9  Chloride 98 - 111 mmol/L 104 103 103  CO2 22 - 32 mmol/L '25 26 25  ' Calcium 8.9 - 10.3 mg/dL 8.8(L) 8.9 8.9  Total Protein 6.5 - 8.1 g/dL 6.7 6.8 6.9  Total Bilirubin 0.3 - 1.2 mg/dL 0.4 0.6 0.5  Alkaline Phos 38 - 126 U/L 84 80 90  AST 15 - 41 U/L '23 23 21  ' ALT 0 - 44 U/L '28 28 28      ' RADIOGRAPHIC STUDIES: I have personally reviewed the radiological images as listed and agreed with the findings in the report. US Breast Limited Uni Right Inc Axilla  Result Date: 10/02/2019 CLINICAL DATA:  44 year old female with biopsy proven invasive mammary carcinoma of the right breast and metastatic axillary adenopathy. Assess response to neoadjuvant chemotherapy. EXAM: DIGITAL DIAGNOSTIC RIGHT MAMMOGRAM WITH CAD AND TOMO ULTRASOUND RIGHT BREAST COMPARISON:  Previous exam(s). ACR Breast Density Category b: There are scattered areas of fibroglandular density. FINDINGS: There is a 2.1 cm mass in the lower inner quadrant of the right breast. There is a heart shaped clip located in the mass. There is an enlarged right axillary lymph node with a HydroMARK clip. No additional mass is seen in the right breast. There are no malignant type microcalcifications. Mammographic images were processed with CAD. Targeted ultrasound is performed, showing there is an irregular hypoechoic mass in the right breast at 5 o'clock 3 cm from the nipple measuring 2.8 x 1.6 x 2.8 cm. There is a solitary enlarged axillary lymph node measuring 3.3 x 1.5 cm (cortical thickening 1.5 cm). On the prior ultrasound dated 07/20/2019 the mass in the right breast at 5 o'clock 3 cm from the nipple measured 3.4 x 2.4 x 3.4 cm and the lymph node cortical thickening measured 1.6 cm. IMPRESSION:  Interval reduction in the size of the mass in the 5 o'clock region of the right breast now measuring 2.8 cm versus 3.3 cm on the prior exam. The axillary lymph node is relatively unchanged.  RECOMMENDATION: Continued treatment planning of the known right breast invasive mammary carcinoma and metastatic axillary adenopathy is recommended. I have discussed the findings and recommendations with the patient. If applicable, a reminder letter will be sent to the patient regarding the next appointment. BI-RADS CATEGORY  6: Known biopsy-proven malignancy. Electronically Signed   By: Lillia Mountain M.D.   On: 10/02/2019 15:29   MM DIAG BREAST TOMO UNI RIGHT  Result Date: 10/02/2019 CLINICAL DATA:  44 year old female with biopsy proven invasive mammary carcinoma of the right breast and metastatic axillary adenopathy. Assess response to neoadjuvant chemotherapy. EXAM: DIGITAL DIAGNOSTIC RIGHT MAMMOGRAM WITH CAD AND TOMO ULTRASOUND RIGHT BREAST COMPARISON:  Previous exam(s). ACR Breast Density Category b: There are scattered areas of fibroglandular density. FINDINGS: There is a 2.1 cm mass in the lower inner quadrant of the right breast. There is a heart shaped clip located in the mass. There is an enlarged right axillary lymph node with a HydroMARK clip. No additional mass is seen in the right breast. There are no malignant type microcalcifications. Mammographic images were processed with CAD. Targeted ultrasound is performed, showing there is an irregular hypoechoic mass in the right breast at 5 o'clock 3 cm from the nipple measuring 2.8 x 1.6 x 2.8 cm. There is a solitary enlarged axillary lymph node measuring 3.3 x 1.5 cm (cortical thickening 1.5 cm). On the prior ultrasound dated 07/20/2019 the mass in the right breast at 5 o'clock 3 cm from the nipple measured 3.4 x 2.4 x 3.4 cm and the lymph node cortical thickening measured 1.6 cm. IMPRESSION: Interval reduction in the size of the mass in the 5 o'clock region of the  right breast now measuring 2.8 cm versus 3.3 cm on the prior exam. The axillary lymph node is relatively unchanged. RECOMMENDATION: Continued treatment planning of the known right breast invasive mammary carcinoma and metastatic axillary adenopathy is recommended. I have discussed the findings and recommendations with the patient. If applicable, a reminder letter will be sent to the patient regarding the next appointment. BI-RADS CATEGORY  6: Known biopsy-proven malignancy. Electronically Signed   By: Lillia Mountain M.D.   On: 10/02/2019 15:29     ASSESSMENT & PLAN:  1. Invasive carcinoma of breast ([place])   2. Encounter for antineoplastic chemotherapy   3. Insomnia, unspecified type   4. Neuropathy due to chemotherapeutic drug ([place])   5. Normocytic anemia     Invasive right breast cancer,  Cancer Staging Malignant neoplasm of lower-inner quadrant of right breast of female, estrogen receptor positive ([place]) Staging form: Breast, AJCC 8th Edition - Clinical stage from 07/26/2019: Stage IIB (cT2, cN1, cM0, G3, ER+, PR+, HER2-) - Unsigned -Status post 4 cycles of dose dense Adriamycin and Cytoxan.   -Patient is currently on weekly Taxol with scalp cooling system. Labs reviewed and discussed with patient Counts are stable and acceptable to proceed with cycle 11 Taxol. Taxol has been reduced to 70 mg/m due to neuropathy. She uses scalp cooling system and tolerates well today. Receives Ativan prior to each treatments.  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  Intermittent Gabapentin has been increased to 600 mg 3 times daily.  Her symptoms are well controlled . #Anemia secondary to chemotherapy, hemoglobin stable.  Continue to monitor. #Insomnia, patient has tried both short acting and long-acting Ambien and did not feel symptoms were relieved. Discussed with patient.  Stop Ambien.  Switch to Ativan 1 mg QHS as needed for insomnia.  Prescription was sent to pharmacy. We  discussed about the plan after she finishes chemotherapy. She will follow-up lab MD 1 week for cycle 12 Taxol. Then she will need to have a repeat unilateral diagnostic mammogram for evaluation treatment response. She will follow up with Dr. Lysle Pearl for surgical evaluation. We also discussed about post surgery radiation and adjuvant antiestrogen treatments  Follow-up in 1 week for assessment for next cycle of Taxol.  All questions were answered. The patient knows to call the clinic with any problems, questions or concerns. No barriers to learning was detected.     Earlie Server, MD 12/25/2019

## 2019-12-25 NOTE — Progress Notes (Signed)
Patient is not sleeping well.  The neuropathy is stable and had pain associated to her feet burning one day last week.

## 2019-12-29 NOTE — Progress Notes (Signed)
Patient contacted for follow up. No concerns voiced. No new breast problems. inv

## 2019-12-31 IMAGING — US US BREAST*R* LIMITED INC AXILLA
1 series · 6 of 6 positions shown · non-contrast
Comparison: Previous exam(s).

CLINICAL DATA: 43-year-old female with biopsy proven invasive
mammary carcinoma of the right breast and metastatic axillary
adenopathy. Assess response to neoadjuvant chemotherapy.

EXAM:
DIGITAL DIAGNOSTIC RIGHT MAMMOGRAM WITH CAD AND TOMO
ULTRASOUND RIGHT BREAST

[Series 1: us breast*right* limited inc axilla · 0.06mm/px · 6 of 6 slices shown]
[im 1/6]
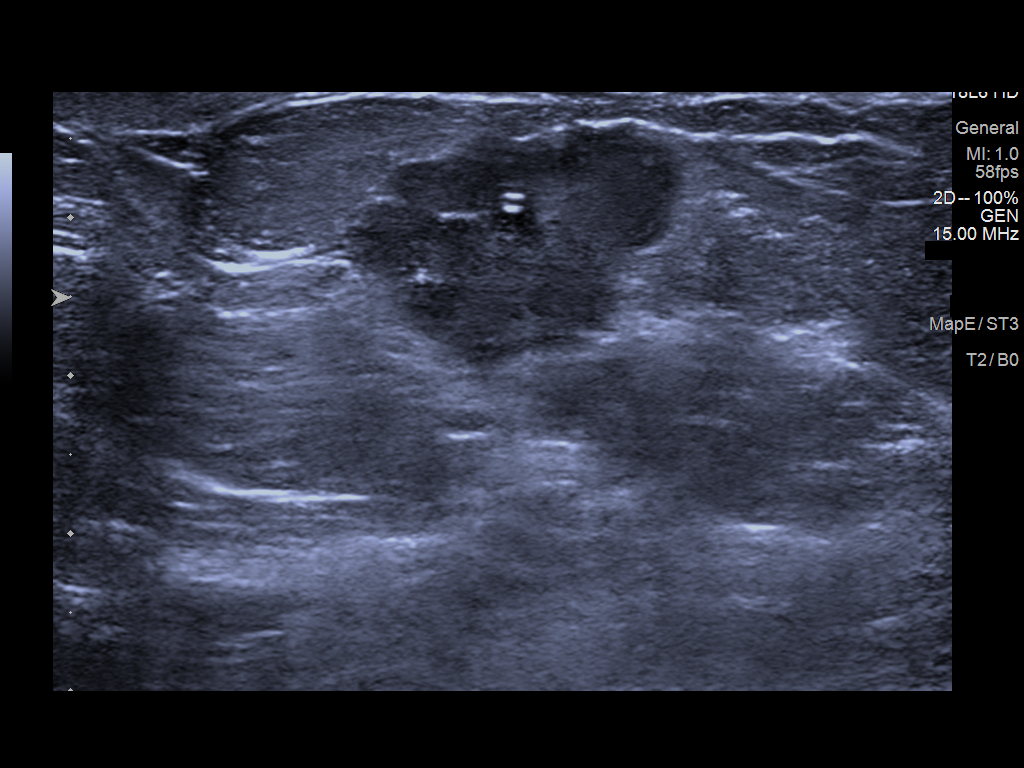
[im 2/6]
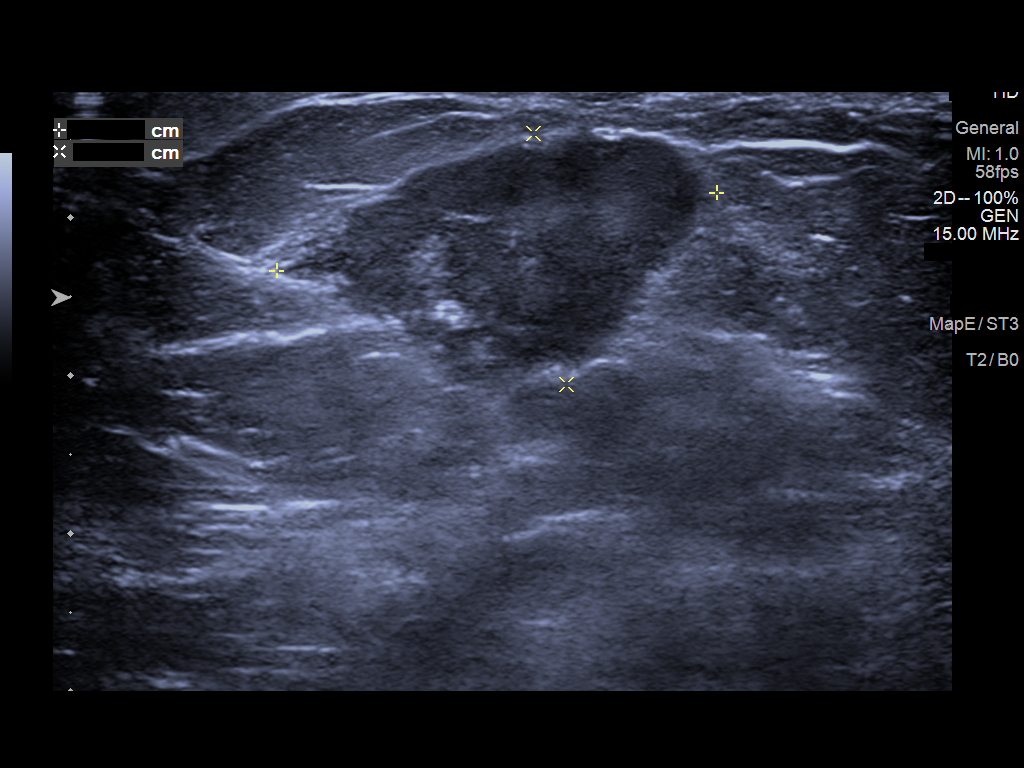
[im 3/6]
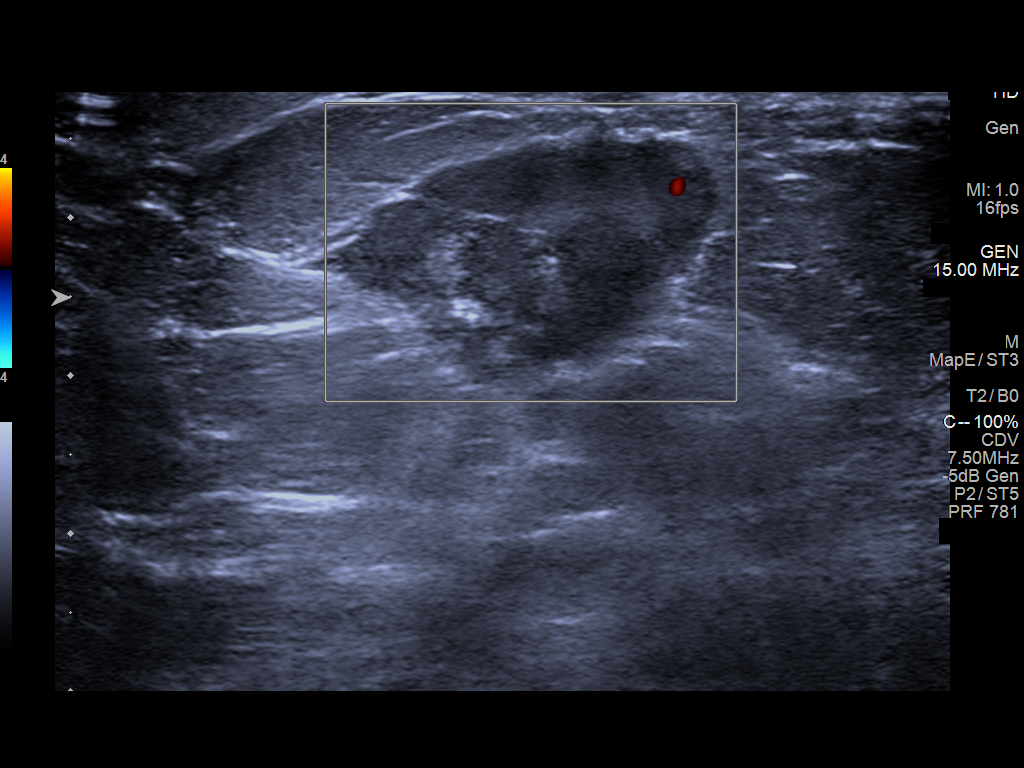
[im 4/6]
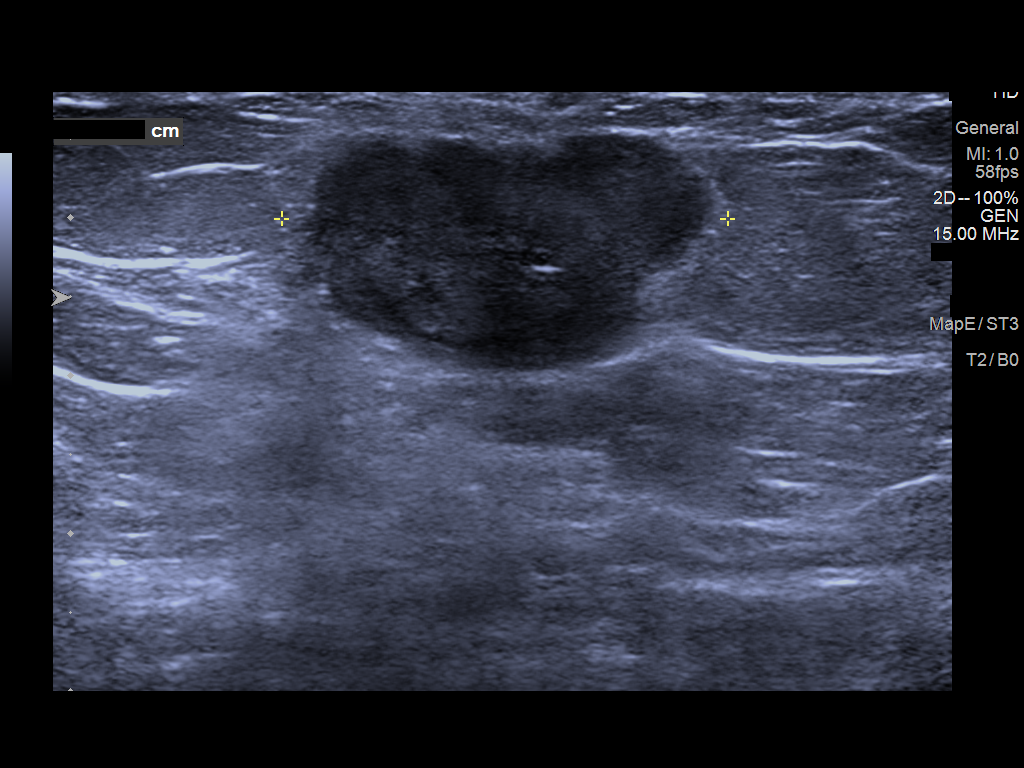
[im 5/6]
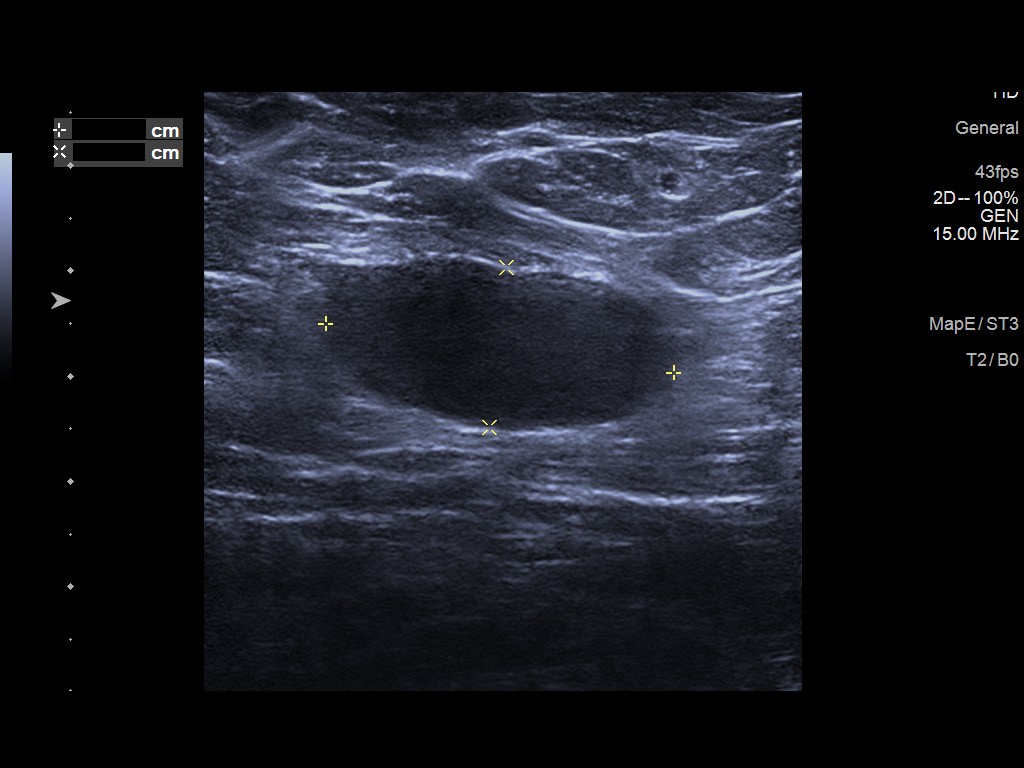
[im 6/6]
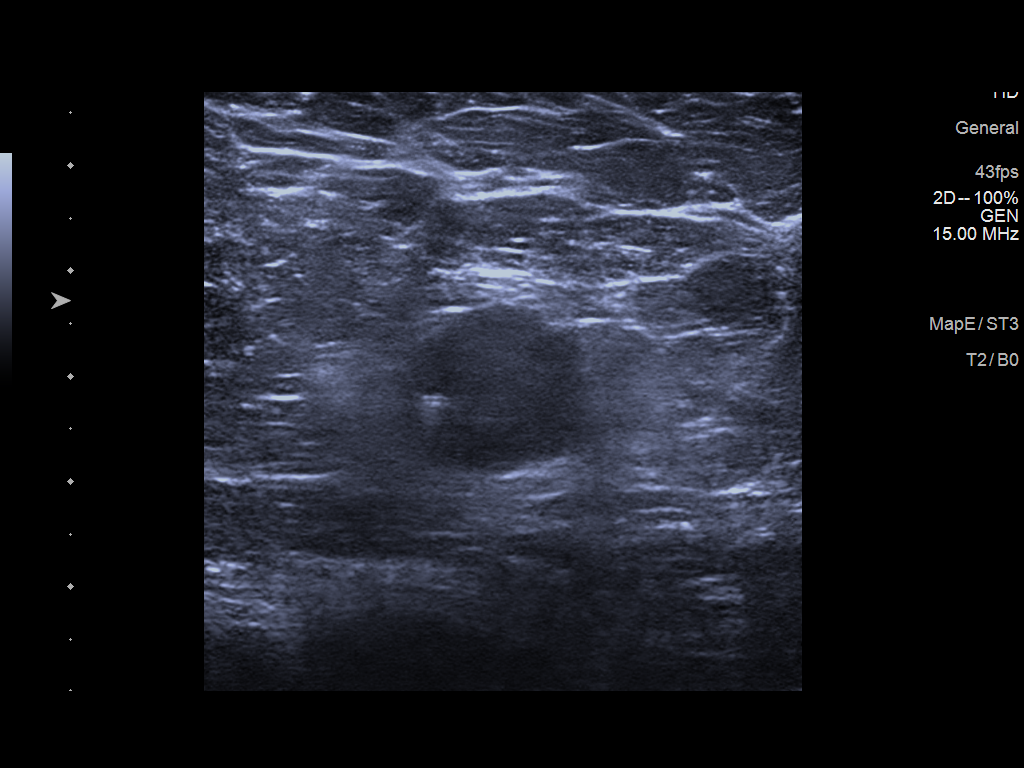

[6 of 6 positions shown; findings below may reference images not displayed]

ACR Breast Density Category b: There are scattered areas of
fibroglandular density.
FINDINGS: There is a 2.1 cm mass in the lower inner quadrant of the right
breast. There is a heart shaped clip located in the mass. There is
an enlarged right axillary lymph node with a HydroMARK clip. No
additional mass is seen in the right breast. There are no malignant
type microcalcifications.

Mammographic images were processed with CAD.

Targeted ultrasound is performed, showing there is an irregular
hypoechoic mass in the right breast at 5 o'clock 3 cm from the
nipple measuring 2.8 x 1.6 x 2.8 cm. There is a solitary enlarged
axillary lymph node measuring 3.3 x 1.5 cm (cortical thickening
cm). On the prior ultrasound dated 07/20/2019 the mass in the right
breast at 5 o'clock 3 cm from the nipple measured 3.4 x 2.4 x 3.4 cm
and the lymph node cortical thickening measured 1.6 cm.
IMPRESSION: Interval reduction in the size of the mass in the 5 o'clock region
of the right breast now measuring 2.8 cm versus 3.3 cm on the prior
exam. The axillary lymph node is relatively unchanged.

RECOMMENDATION:
Continued treatment planning of the known right breast invasive
mammary carcinoma and metastatic axillary adenopathy is recommended.

I have discussed the findings and recommendations with the patient.
If applicable, a reminder letter will be sent to the patient
regarding the next appointment.

BI-RADS CATEGORY  6: Known biopsy-proven malignancy.

## 2020-01-01 ENCOUNTER — Encounter: Payer: Self-pay | Admitting: Oncology

## 2020-01-01 ENCOUNTER — Inpatient Hospital Stay: Payer: BC Managed Care – PPO

## 2020-01-01 ENCOUNTER — Inpatient Hospital Stay (HOSPITAL_BASED_OUTPATIENT_CLINIC_OR_DEPARTMENT_OTHER): Payer: BC Managed Care – PPO | Admitting: Oncology

## 2020-01-01 ENCOUNTER — Inpatient Hospital Stay: Payer: BC Managed Care – PPO | Attending: Oncology

## 2020-01-01 ENCOUNTER — Other Ambulatory Visit: Payer: Self-pay

## 2020-01-01 VITALS — HR 97

## 2020-01-01 VITALS — BP 143/82 | HR 116 | Temp 97.2°F | Wt 224.4 lb

## 2020-01-01 DIAGNOSIS — C50311 Malignant neoplasm of lower-inner quadrant of right female breast: Secondary | ICD-10-CM

## 2020-01-01 DIAGNOSIS — Z7982 Long term (current) use of aspirin: Secondary | ICD-10-CM | POA: Diagnosis not present

## 2020-01-01 DIAGNOSIS — D649 Anemia, unspecified: Secondary | ICD-10-CM

## 2020-01-01 DIAGNOSIS — Z8049 Family history of malignant neoplasm of other genital organs: Secondary | ICD-10-CM | POA: Insufficient documentation

## 2020-01-01 DIAGNOSIS — G620 Drug-induced polyneuropathy: Secondary | ICD-10-CM

## 2020-01-01 DIAGNOSIS — D6481 Anemia due to antineoplastic chemotherapy: Secondary | ICD-10-CM | POA: Diagnosis not present

## 2020-01-01 DIAGNOSIS — T451X5A Adverse effect of antineoplastic and immunosuppressive drugs, initial encounter: Secondary | ICD-10-CM | POA: Diagnosis not present

## 2020-01-01 DIAGNOSIS — Z801 Family history of malignant neoplasm of trachea, bronchus and lung: Secondary | ICD-10-CM | POA: Diagnosis not present

## 2020-01-01 DIAGNOSIS — G4700 Insomnia, unspecified: Secondary | ICD-10-CM | POA: Insufficient documentation

## 2020-01-01 DIAGNOSIS — Z5111 Encounter for antineoplastic chemotherapy: Secondary | ICD-10-CM

## 2020-01-01 DIAGNOSIS — Z95828 Presence of other vascular implants and grafts: Secondary | ICD-10-CM

## 2020-01-01 DIAGNOSIS — Z170 Estrogen receptor positive status [ER+]: Secondary | ICD-10-CM | POA: Insufficient documentation

## 2020-01-01 DIAGNOSIS — Z79899 Other long term (current) drug therapy: Secondary | ICD-10-CM | POA: Diagnosis not present

## 2020-01-01 DIAGNOSIS — C50919 Malignant neoplasm of unspecified site of unspecified female breast: Secondary | ICD-10-CM

## 2020-01-01 DIAGNOSIS — R5383 Other fatigue: Secondary | ICD-10-CM | POA: Insufficient documentation

## 2020-01-01 LAB — COMPREHENSIVE METABOLIC PANEL
ALT: 31 U/L (ref 0–44)
AST: 28 U/L (ref 15–41)
Albumin: 3.8 g/dL (ref 3.5–5.0)
Alkaline Phosphatase: 68 U/L (ref 38–126)
Anion gap: 10 (ref 5–15)
BUN: 9 mg/dL (ref 6–20)
CO2: 24 mmol/L (ref 22–32)
Calcium: 8.8 mg/dL — ABNORMAL LOW (ref 8.9–10.3)
Chloride: 106 mmol/L (ref 98–111)
Creatinine, Ser: 0.74 mg/dL (ref 0.44–1.00)
GFR calc Af Amer: >60 mL/min (ref >60)
GFR calc non Af Amer: >60 mL/min (ref >60)
Glucose, Bld: 146 mg/dL — ABNORMAL HIGH (ref 70–99)
Potassium: 3.6 mmol/L (ref 3.5–5.1)
Sodium: 140 mmol/L (ref 135–145)
Total Bilirubin: 0.5 mg/dL (ref 0.3–1.2)
Total Protein: 6.6 g/dL (ref 6.5–8.1)

## 2020-01-01 LAB — CBC WITH DIFFERENTIAL/PLATELET
Abs Immature Granulocytes: 0.04 10*3/uL (ref 0.00–0.07)
Basophils Absolute: 0.1 10*3/uL (ref 0.0–0.1)
Basophils Relative: 1 %
Eosinophils Absolute: 0.2 10*3/uL (ref 0.0–0.5)
Eosinophils Relative: 4 %
HCT: 34.5 % — ABNORMAL LOW (ref 36.0–46.0)
Hemoglobin: 10.7 g/dL — ABNORMAL LOW (ref 12.0–15.0)
Immature Granulocytes: 1 %
Lymphocytes Relative: 24 %
Lymphs Abs: 1.4 10*3/uL (ref 0.7–4.0)
MCH: 29.3 pg (ref 26.0–34.0)
MCHC: 31 g/dL (ref 30.0–36.0)
MCV: 94.5 fL (ref 80.0–100.0)
Monocytes Absolute: 0.5 10*3/uL (ref 0.1–1.0)
Monocytes Relative: 8 %
Neutro Abs: 3.7 10*3/uL (ref 1.7–7.7)
Neutrophils Relative %: 62 %
Platelets: 349 10*3/uL (ref 150–400)
RBC: 3.65 MIL/uL — ABNORMAL LOW (ref 3.87–5.11)
RDW: 16.3 % — ABNORMAL HIGH (ref 11.5–15.5)
WBC: 5.8 10*3/uL (ref 4.0–10.5)
nRBC: 0 % (ref 0.0–0.2)

## 2020-01-01 MED ORDER — SODIUM CHLORIDE 0.9 % IV SOLN
20.0000 mg | Freq: Once | INTRAVENOUS | Status: AC
  Administered 2020-01-01: 20 mg via INTRAVENOUS
  Filled 2020-01-01: qty 2

## 2020-01-01 MED ORDER — HEPARIN SOD (PORK) LOCK FLUSH 100 UNIT/ML IV SOLN
500.0000 [IU] | Freq: Once | INTRAVENOUS | Status: AC | PRN
  Administered 2020-01-01: 500 [IU]
  Filled 2020-01-01: qty 5

## 2020-01-01 MED ORDER — SODIUM CHLORIDE 0.9 % IV SOLN
Freq: Once | INTRAVENOUS | Status: AC
  Filled 2020-01-01: qty 250

## 2020-01-01 MED ORDER — FAMOTIDINE IN NACL 20-0.9 MG/50ML-% IV SOLN
20.0000 mg | Freq: Once | INTRAVENOUS | Status: AC
  Administered 2020-01-01: 20 mg via INTRAVENOUS
  Filled 2020-01-01: qty 50

## 2020-01-01 MED ORDER — HEPARIN SOD (PORK) LOCK FLUSH 100 UNIT/ML IV SOLN
INTRAVENOUS | Status: AC
  Filled 2020-01-01: qty 5

## 2020-01-01 MED ORDER — DIPHENHYDRAMINE HCL 50 MG/ML IJ SOLN
50.0000 mg | Freq: Once | INTRAMUSCULAR | Status: AC
  Administered 2020-01-01: 50 mg via INTRAVENOUS
  Filled 2020-01-01: qty 1

## 2020-01-01 MED ORDER — SODIUM CHLORIDE 0.9 % IV SOLN
70.0000 mg/m2 | Freq: Once | INTRAVENOUS | Status: AC
  Administered 2020-01-01: 150 mg via INTRAVENOUS
  Filled 2020-01-01: qty 25

## 2020-01-01 MED ORDER — LORAZEPAM 2 MG/ML IJ SOLN
1.0000 mg | Freq: Once | INTRAMUSCULAR | Status: AC
  Administered 2020-01-01: 1 mg via INTRAVENOUS
  Filled 2020-01-01: qty 1

## 2020-01-01 MED ORDER — SODIUM CHLORIDE 0.9% FLUSH
10.0000 mL | Freq: Once | INTRAVENOUS | Status: AC
  Administered 2020-01-01: 10 mL via INTRAVENOUS
  Filled 2020-01-01: qty 10

## 2020-01-01 NOTE — Progress Notes (Signed)
[place]   [phone] [phone]   Clinic Follow up Note   Patient Care Team: Ricardo Jericho, NP as PCP - General (Family Medicine) Rico Junker, RN as Registered Nurse  Date of Service:  01/01/2020  CHIEF COMPLAINT: F/u of right breast cancer   SUMMARY OF ONCOLOGIC HISTORY: Oncology History Overview Note  Cancer Staging Malignant neoplasm of lower-inner quadrant of right breast of female, estrogen receptor positive ([place]) Staging form: Breast, AJCC 8th Edition - Clinical stage from 07/26/2019: Stage IIB (cT2, cN1, cM0, G3, ER+, PR+, HER2-) - Unsigned    Malignant neoplasm of lower-inner quadrant of right breast of female, estrogen receptor positive ([place])  07/20/2019 Mammogram   Mammogram 07/20/19  IMPRESSION: Suspicious palpable right breast mass 3.4 x 2.5 x 3.4 cm 5 o'clock position 3 cm from nipple.   Suspicious 1.6cm cortically thickened right axillary lymph node.   07/26/2019 Initial Diagnosis   DIAGNOSIS: 07/26/19 A. RIGHT BREAST, 5:00, 3CMFN; ULTRASOUND-GUIDED NEEDLE CORE BIOPSY:  - INVASIVE MAMMARY CARCINOMA, NO SPECIAL TYPE.    07/26/2019 Receptors her2   BREAST BIOMARKER TESTS  Estrogen Receptor (ER) Status: POSITIVE                       Percentage of cells with nuclear positivity: 51-90%                       Average intensity of staining: Strong   Progesterone Receptor (PgR) Status: POSITIVE                       Percentage of cells with nuclear positivity:  Greater than 90%                       Average intensity of staining: Strong   HER2 (by immunohistochemistry): NEGATIVE (Score 1+)    08/05/2019 Initial Diagnosis   Invasive carcinoma of breast ([place])   08/17/2019 Surgery   INSERTION PORT-A-CATH by Dr. Lysle Pearl 08/17/19    08/18/2019 -  Chemotherapy   AC q2weeks for 4 cycles starting 08/18/19 followed by weekly Taxol for 12 weeks     Genetic Testing   Negative genetic testing. No pathogenic variants  identified on the [facility] Common Hereditary Cancers Panel. The report date is 11/13/2019.   The Common Hereditary Cancers Panel offered by [facility] includes sequencing and/or deletion duplication testing of the following 48 genes: APC, ATM, AXIN2, BARD1, BMPR1A, BRCA1, BRCA2, BRIP1, CDH1, CDKN2A (p14ARF), CDKN2A (p16INK4a), CKD4, CHEK2, CTNNA1, DICER1, EPCAM (Deletion/duplication testing only), GREM1 (promoter region deletion/duplication testing only), KIT, MEN1, MLH1, MSH2, MSH3, MSH6, MUTYH, NBN, NF1, NHTL1, PALB2, PDGFRA, PMS2, POLD1, POLE, PTEN, RAD50, RAD51C, RAD51D, RNF43, SDHB, SDHC, SDHD, SMAD4, SMARCA4. STK11, TP53, TSC1, TSC2, and VHL.  The following genes were evaluated for sequence changes only: SDHA and HOXB13 c.251G>A variant only.      CURRENT THERAPY:  Neoadjuvant chemo ddAC starting 08/18/19 for 4 cycles followed by weekly Taxol   INTERVAL HISTORY:  Jessica [name] presents to follow-up for evaluation prior to chemotherapy for breast cancer treatment. Patient has completed 4 cycles of dose dense Adriamycin and Cytoxan with scalp cooling system with Dr.Feng at [facility]. Currently on weekly Taxol.  Overall Jessica [name] tolerates well with mild to moderate difficulties Neuropathy, Jessica [name] takes gabapentin 600 mg 3 times daily.  Also has pre-existing sciatica. 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 Insomnia, controlled with Ativan. No new complaints.   Review of Systems  Constitutional: Positive for fatigue. Negative for appetite change, chills and fever.  HENT:   Negative for hearing loss and voice change.   Eyes: Negative for eye problems.  Respiratory: Negative for chest tightness and cough.   Cardiovascular: Negative for chest pain.  Gastrointestinal: Negative for abdominal distention, abdominal pain and blood in stool.    Endocrine: Negative for hot flashes.  Genitourinary: Negative for difficulty urinating and frequency.   Musculoskeletal: Negative for arthralgias.  Skin: Negative for itching and rash.  Neurological: Positive for numbness. Negative for extremity weakness.  Hematological: Negative for adenopathy.  Psychiatric/Behavioral: Positive for sleep disturbance. Negative for confusion.    MEDICAL HISTORY:  Past Medical History:  Diagnosis Date  . Breast cancer ([place])    right breast  . Cancer ([place])    breast cancer  . Family history of cervical cancer   . Family history of lung cancer   . Frequency   . Kidney stones   . Obesity   . Personal history of chemotherapy   . Right flank pain     SURGICAL HISTORY: Past Surgical History:  Procedure Laterality Date  . BREAST BIOPSY    . CESAREAN SECTION     x 2  . CHOLECYSTECTOMY    . DILATION AND CURETTAGE OF UTERUS    . PORTACATH PLACEMENT Left 08/17/2019   Procedure: INSERTION PORT-A-CATH;  Surgeon: Benjamine Sprague, DO;  Location: [facility] ORS;  Service: General;  Laterality: Left;  . TUBAL LIGATION      I have reviewed the social history and family history with the patient and they are unchanged from previous note.  ALLERGIES:  has No Known Allergies.  MEDICATIONS:  Current Outpatient Medications  Medication Sig Dispense Refill  . ferrous sulfate 325 (65 FE) MG EC tablet TAKE 1 TABLET (325 MG TOTAL) BY MOUTH 2 (TWO) TIMES DAILY WITH A MEAL. 60 tablet 1  . gabapentin (NEURONTIN) 600 MG tablet Take 600 mg by mouth 3 (three) times daily.    Marland Kitchen lidocaine-prilocaine (EMLA) cream Apply to affected area once 30 g 3  . LORazepam (ATIVAN) 1 MG tablet Take 1 tablet (1 mg total) by mouth at bedtime as needed for sleep. 5 tablet 0  . omeprazole (PRILOSEC) 20 MG capsule TAKE 1 CAPSULE BY MOUTH EVERY DAY 90 capsule 0  . prochlorperazine (COMPAZINE) 10 MG tablet Take 1 tablet (10 mg total) by mouth every 6 (six) hours as needed (Nausea or vomiting). 30  tablet 1  . Aspirin-Acetaminophen-Caffeine (GOODYS EXTRA STRENGTH PO) Take by mouth.    . zolpidem (AMBIEN CR) 6.25 MG CR tablet Take 1 tablet (6.25 mg total) by mouth at bedtime as needed for sleep. (Patient not taking: Reported on 12/29/2019) 3 tablet 0   No current facility-administered medications for this visit.   Facility-Administered Medications Ordered in Other Visits  Medication Dose Route Frequency Provider Last Rate Last Admin  . sodium chloride flush (NS) 0.9 % injection 10 mL  10 mL Intravenous Once Earlie Server, MD        PHYSICAL EXAMINATION: ECOG PERFORMANCE STATUS: 1 - Symptomatic but completely ambulatory  Vitals:   01/01/20 0851  BP: (!) 143/82  Pulse: (!) 116  Temp: (!) 97.2 F (36.2 C)   Filed Weights   01/01/20 0851  Weight: 224 lb 6.4 oz (101.8 kg)   Physical Exam  Constitutional: Jessica [name] is oriented to person, place, and time. No distress.  HENT:  Head: Normocephalic and atraumatic.  Nose: Nose normal.  Mouth/Throat: Oropharynx is clear and moist. No oropharyngeal exudate.  Eyes: Pupils are equal, round, and reactive to light. EOM are normal. No scleral icterus.  Cardiovascular: Normal rate and regular rhythm.  No murmur heard. Pulmonary/Chest: Effort normal. No respiratory distress. Jessica [name] has no rales. Jessica [name] exhibits no tenderness.  Abdominal: Soft. Jessica [name] exhibits no distension. There is no abdominal tenderness.  Musculoskeletal:        General: No edema. Normal range of motion.     Cervical back: Normal range of motion and neck supple.  Neurological: Jessica [name] is alert and oriented to person, place, and time. No cranial nerve deficit. Jessica [name] exhibits normal muscle tone. Coordination normal.  Skin: Skin is warm and dry. Jessica [name] is not diaphoretic. No erythema.  Psychiatric: Affect normal.     LABORATORY DATA:  I have reviewed the data as listed CBC Latest Ref Rng & Units 01/01/2020 12/25/2019 12/18/2019  WBC 4.0 - 10.5 K/uL 5.8 5.6 6.0  Hemoglobin 12.0 - 15.0 g/dL 10.7(L)  10.5(L) 10.8(L)  Hematocrit 36.0 - 46.0 % 34.5(L) 32.6(L) 34.6(L)  Platelets 150 - 400 K/uL 349 343 372     CMP Latest Ref Rng & Units 01/01/2020 12/25/2019 12/18/2019  Glucose 70 - 99 mg/dL 146(H) 149(H) 152(H)  BUN 6 - 20 mg/dL '9 11 11  ' Creatinine 0.44 - 1.00 mg/dL 0.74 0.64 0.83  Sodium 135 - 145 mmol/L 140 138 138  Potassium 3.5 - 5.1 mmol/L 3.6 3.8 3.8  Chloride 98 - 111 mmol/L 106 104 103  CO2 22 - 32 mmol/L '24 25 26  ' Calcium 8.9 - 10.3 mg/dL 8.8(L) 8.8(L) 8.9  Total Protein 6.5 - 8.1 g/dL 6.6 6.7 6.8  Total Bilirubin 0.3 - 1.2 mg/dL 0.5 0.4 0.6  Alkaline Phos 38 - 126 U/L 68 84 80  AST 15 - 41 U/L '28 23 23  ' ALT 0 - 44 U/L '31 28 28      ' RADIOGRAPHIC STUDIES: I have personally reviewed the radiological images as listed and agreed with the findings in the report. No results found.   ASSESSMENT & PLAN:  1. Malignant neoplasm of lower-inner quadrant of right breast of female, estrogen receptor positive ([place])   2. Encounter for antineoplastic chemotherapy   3. Insomnia, unspecified type   4. Neuropathy due to chemotherapeutic drug ([place])   5. Port-A-Cath in place   6. Normocytic anemia     Invasive right breast cancer,  Cancer Staging Malignant neoplasm of lower-inner quadrant of right breast of female, estrogen receptor positive ([place]) Staging form: Breast, AJCC 8th Edition - Clinical stage from 07/26/2019: Stage IIB (cT2, cN1, cM0, G3, ER+, PR+, HER2-) - Unsigned -Status post 4 cycles of dose dense Adriamycin and Cytoxan.   -Patient is currently on weekly Taxol with scalp cooling system. Labs reviewed and discussed with patient. Patient's counts are stable and acceptable to proceed with cycle 12 Taxol. Taxol has been reduced to 70 mg/m due to neuropathy. Jessica [name] also uses scalp cooling system and tolerates well.  Jessica [name] receives Ativan prior to each treatment .  Obtain post chemotherapy unilateral right diagnostic mammogram for evaluation of treatment response. Discussed with  Dr. Lysle Pearl, Jessica [name] will have a follow-up appointment with Dr. Lysle Pearl after the mammogram for discussion prior to surgery. Current plan is to proceed with lumpectomy with sentinel lymph node biopsy. Patient will follow up after surgery to go over pathology  report.  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  Intermittent Gabapentin has been increased to 600 mg 3 times daily.  Her symptoms are stable.  Continue gabapentin. Will refer patient to acupuncture clinic. Marland Kitchen #Anemia secondary to chemotherapy, hemoglobin stable.  Continue to monitor. #Insomnia, patient uses Ativan QHS as needed for insomnia. #Port-A-Cath in place, patient will need port flush every 6 to 8 weeks.  We will further discuss with her after her surgery.  Follow-up to be determined.  All questions were answered. The patient knows to call the clinic with any problems, questions or concerns. No barriers to learning was detected.     Earlie Server, MD 01/01/2020

## 2020-01-01 NOTE — Progress Notes (Signed)
B/P 143/82 HR 116.  HR 97.  Per Janeann Merl RN, Per Dr. Tasia Catchings okay to proceed with treatment.    01/01/20 1400: Dignicap performed per protocol. Pt stable at discharge.

## 2020-01-02 ENCOUNTER — Encounter: Payer: Self-pay | Admitting: *Deleted

## 2020-01-04 ENCOUNTER — Encounter: Payer: Self-pay | Admitting: Oncology

## 2020-01-04 ENCOUNTER — Telehealth: Payer: Self-pay | Admitting: *Deleted

## 2020-01-04 NOTE — Telephone Encounter (Signed)
Jessica [name] with dental office called asking for clearance to do dental claning on this patient who had Chemotherapy Monday. Please return her call [phone]

## 2020-01-05 ENCOUNTER — Other Ambulatory Visit: Payer: Self-pay | Admitting: Oncology

## 2020-01-05 MED ORDER — NYSTATIN 100000 UNIT/GM EX POWD: 1.0000 "application " | Freq: Three times a day (TID) | CUTANEOUS | 0 refills | Status: DC

## 2020-01-05 NOTE — Telephone Encounter (Signed)
Call returned to [facility] and informed that patient can have dental cleaning on 3/12 due to her chemotherapy treatments

## 2020-01-05 NOTE — Telephone Encounter (Signed)
Per Dr. Tasia Catchings, [facility] for patient to get dental cleaning 2 weeks after completing chemo. Pt's last chemo was on 12/29/19.

## 2020-01-06 ENCOUNTER — Encounter: Payer: Self-pay | Admitting: *Deleted

## 2020-01-09 ENCOUNTER — Encounter: Payer: Self-pay | Admitting: *Deleted

## 2020-01-10 ENCOUNTER — Ambulatory Visit
Admission: RE | Admit: 2020-01-10 | Discharge: 2020-01-10 | Disposition: A | Discharge status: Home / Self Care | Payer: BC Managed Care – PPO | Source: Ambulatory Visit | Attending: Oncology | Admitting: Oncology

## 2020-01-10 DIAGNOSIS — C50919 Malignant neoplasm of unspecified site of unspecified female breast: Secondary | ICD-10-CM | POA: Diagnosis not present

## 2020-01-12 DIAGNOSIS — N3946 Mixed incontinence: Secondary | ICD-10-CM | POA: Insufficient documentation

## 2020-01-12 DIAGNOSIS — E538 Deficiency of other specified B group vitamins: Secondary | ICD-10-CM | POA: Insufficient documentation

## 2020-01-17 ENCOUNTER — Ambulatory Visit: Payer: Self-pay | Admitting: Surgery

## 2020-01-17 ENCOUNTER — Encounter: Payer: Self-pay | Admitting: *Deleted

## 2020-01-17 NOTE — H&P (View-Only) (Signed)
Subjective:   CC: Breast cancer metastasized to axillary lymph node, right (CMS-HCC) [C50.911, C77.3] HPI:  Jessica [name] is a 44 y.o. female who was referred by Jessica Server, [name] for evaluation of above. Change was noted during breast exam and last screening mammogram. Patient does not routinely do self breast exams.  Patient has noted a change on breast exam. Patient reports hormonal therapy. Patient is G4P2. Age of first live birth was 9. Patient did not breast feed. Patient denies nipple discharge. Patient denies previous breast biopsy. Patient denies a personal history of breast cancer.  She has since received port and finished her neoadjuvant chemo with decreased size of primary tumor and metastatic lymph node.  No additional lesions or suspicious nodes noted on recent imaging.    Current Medications: has a current medication list which includes the following prescription(s): ferrous sulfate, gabapentin, lidocaine-prilocaine, and omeprazole.  Allergies:  Allergies as of 01/17/2020  . (No Known Allergies)    ROS:  A 15 point review of systems was performed and was negative except as noted in HPI   Objective:     BP (!) 126/90   Pulse 85   Ht 157.5 cm ('5\' 2"' )   Wt 98.9 kg (218 lb)   BMI 39.87 kg/m   Constitutional :  alert, appears stated age, cooperative and no distress  Lymphatics/Throat:  no asymmetry, masses, or scars  Respiratory:  clear to auscultation bilaterally  Cardiovascular:  regular rate and rhythm  Gastrointestinal: soft, non-tender; bowel sounds normal; no masses,  no organomegaly.   Musculoskeletal: Steady gait and movement  Skin: Cool and moist, no surgical scars  Psychiatric: Normal affect, non-agitated, not confused  Breast:  Chaperone present for exam.  left breast normal without mass, skin or nipple changes or axillary nodes, right breast with normal, no obvious mass, and no obvious lymphadenopathy noted in right axilla.    LABS:  SURGICAL PATHOLOGY  SURGICAL PATHOLOGY CASE: [id number] PATIENT: Jessica [name] Surgical Pathology Report     Specimen Submitted: A. Breast, right, 5:00, 3 CMFN B. Axilla, right  Clinical History: Palpable right breast mass, highly suspicious. Markedly abnormal right axillary lymph node on diagnostic workup; Highly suspicious for malignancy, highly suspicious for metastatic lymph node      DIAGNOSIS: A. RIGHT BREAST, 5:00, 3CMFN; ULTRASOUND-GUIDED NEEDLE CORE BIOPSY: - INVASIVE MAMMARY CARCINOMA, NO SPECIAL TYPE.  Size of invasive carcinoma: 11 mm in this sample Histologic grade of invasive carcinoma: Grade 3            Glandular/tubular differentiation score: 2            Nuclear pleomorphism score: 3            Mitotic rate score: 3            Total score: 8 Ductal carcinoma in situ: Not identified Lymphovascular invasion: Not identified  ER/PR/HER2: Immunohistochemistry will be performed on block A1, with reflex to Jessica [name] for HER2 2+. The results will be reported in an addendum.  Comment: The definitive grade will be assigned on the excisional specimen. These findings were communicated to Jessica Sniff, [name] via [facility] secure chat on 07/28/19.  B. LYMPH NODE, RIGHT AXILLARY; ULTRASOUND-GUIDED NEEDLE CORE BIOPSY: - POSITIVE FOR METASTATIC CARCINOMA, 10 MM IN THIS SAMPLE.     GROSS DESCRIPTION: A. Labeled: Ultrasound-guided right breast core biopsy at 5 o'clock position and 3 cm from nipple Received: In formalin Time/date in fixative: Tissue procedure time 8:38 AM, tissue put in formalin time 8:30 AM  07/26/2019 Cold ischemic time: Less than minute Total fixation time: 9 hours Core pieces: 4 Size: 1.2 cm in length and 0.1 cm in diameter each Description: White soft tissue cord Ink color: Black Entirely submitted in 1 cassette.  [default value]. Labeled: Ultrasound-guided right axilla core biopsy Received: In formalin Time/date in fixative:  Tissue procedure time 8:54 AM, tissue put in formalin time 8:54 AM 07/26/2019 Cold ischemic time: Less than minute Total fixation time: 8 hours Core pieces: 4 Size: From 1.0-1.4 cm in length and 0.1 cm in diameter Description: White soft tissue cores Ink color: Blue Entirely submitted in 1 cassette.      Final Diagnosis performed by Jessica Pries, [name].  Electronically signed 07/28/2019 9:58:50AM The electronic signature indicates that the named Attending Pathologist has evaluated the specimen Technical component performed at [facility], [address] [place], [place] [address] Lab: [phone] Dir: Jessica Farmer, [name], MMM  Professional component performed at [facility], [facility], [place], [place], [place] [address] Lab: [phone] Dir: Jessica Nims. Reuel Derby, [name]      RADS: Addendum by Jessica [name] on 07/28/2019  3:29 PM ADDENDUM REPORT: 07/28/2019 13:08  ADDENDUM: PATHOLOGY revealed: A. RIGHT BREAST, 5:00, 3CMFN; ULTRASOUND-GUIDED NEEDLE CORE BIOPSY: - INVASIVE MAMMARY CARCINOMA, NO SPECIAL TYPE. 11 mm in this sample. Grade 3. Ductal carcinoma in situ: Not identified. Lymphovascular invasion: Not identified. Comment: The definitive grade will be assigned on the excisional specimen.  B. LYMPH NODE, RIGHT AXILLARY; ULTRASOUND-GUIDED NEEDLE CORE BIOPSY: - POSITIVE FOR METASTATIC CARCINOMA, 10 MM IN THIS SAMPLE.  Pathology results are CONCORDANT with imaging findings, per Dr. Ammie [name].  Pathology results were discussed with patient via telephone. The patient reported doing well after the biopsy with tenderness at the site. Post biopsy care instructions were reviewed and questions were answered. The patient was encouraged to call [facility] for any additional concerns.  Recommendation: Surgical referral. Request for surgical referral was relayed to nurse navigators at [facility] by Jessica [name]  on 07/28/2019.  Addendum by Jessica [name] on 07/28/2019.   Electronically Signed  By: Jessica [name] M.D.  On: 07/28/2019 13:08  Result Narrative  CLINICAL DATA: 44 year old female presenting for ultrasound-guided biopsy of a right breast mass and right axillary lymph node.  EXAM: ULTRASOUND GUIDED RIGHT BREAST CORE NEEDLE BIOPSY  COMPARISON: Previous exam(s).  FINDINGS: I met with the patient and we discussed the procedure of ultrasound-guided biopsy, including benefits and alternatives. We discussed the high likelihood of a successful procedure. We discussed the risks of the procedure, including infection, bleeding, tissue injury, clip migration, and inadequate sampling. Informed written consent was given. The usual time-out protocol was performed immediately prior to the procedure.  #1 Lesion quadrant: Lower-inner quadrant  Using sterile technique and 1% Lidocaine as local anesthetic, under direct ultrasound visualization, a 14 gauge spring-loaded device was used to perform biopsy of a mass in the right breast at 5 o'clock using an inferior approach. At the conclusion of the procedure a heart shaped tissue marker clip was deployed into the biopsy cavity.   --------------------------------------------------------------------------------------------------------------------------------------------  #2 Lesion quadrant: Right axilla  Using sterile technique and 1% Lidocaine as local anesthetic, under direct ultrasound visualization, a 14 gauge spring-loaded device was used to perform biopsy of a right axillary lymph node using an inferior approach. At the conclusion of the procedure a HydroMARK shape 3 tissue marker clip was deployed into the biopsy cavity.  Follow up 2 view mammogram was performed and dictated  separately.  IMPRESSION: 1. Ultrasound guided biopsy of a right breast mass at 5 o'clock. No apparent complications.  2. Ultrasound guided biopsy of a  right axillary lymph node. No apparent complications.  Electronically Signed: By: Jessica [name] M.D. On: 07/26/2019 09:12  Other Result Information  Interface, Rad Results In - 07/28/2019  3:29 PM EDT CLINICAL DATA:  44 year old female presenting for ultrasound-guided biopsy of a right breast mass and right axillary lymph node.  EXAM: ULTRASOUND GUIDED RIGHT BREAST CORE NEEDLE BIOPSY  COMPARISON:  Previous exam(s).  FINDINGS: I met with the patient and we discussed the procedure of ultrasound-guided biopsy, including benefits and alternatives. We discussed the high likelihood of a successful procedure. We discussed the risks of the procedure, including infection, bleeding, tissue injury, clip migration, and inadequate sampling. Informed written consent was given. The usual time-out protocol was performed immediately prior to the procedure.  #1 Lesion quadrant: Lower-inner quadrant  Using sterile technique and 1% Lidocaine as local anesthetic, under direct ultrasound visualization, a 14 gauge spring-loaded device was used to perform biopsy of a mass in the right breast at 5 o'clock using an inferior approach. At the conclusion of the procedure a heart shaped tissue marker clip was deployed into the biopsy cavity.   --------------------------------------------------------------------------------------------------------------------------------------------  #2 Lesion quadrant: Right axilla  Using sterile technique and 1% Lidocaine as local anesthetic, under direct ultrasound visualization, a 14 gauge spring-loaded device was used to perform biopsy of a right axillary lymph node using an inferior approach. At the conclusion of the procedure a HydroMARK shape 3 tissue marker clip was deployed into the biopsy cavity.  Follow up 2 view mammogram was performed and dictated separately.  IMPRESSION: 1. Ultrasound guided biopsy of a right breast mass at 5 o'clock. No apparent  complications.  2. Ultrasound guided biopsy of a right axillary lymph node. No apparent complications.  Electronically Signed: By: Jessica [name] M.D. On: 07/26/2019 09:12     Assessment:   Breast cancer metastasized to axillary lymph node, right (CMS-HCC) [C50.911, C77.3]  Plan:     1. Breast cancer metastasized to axillary lymph node, right (CMS-HCC) [C50.911, C77.3]   Discussed the risk of surgery including bleeding, chronic pain, post-op infxn, poor/delayed wound healing, poor cosmesis, seroma, hematoma formation, and possible re-operation to address said risks. The risks of general anesthetic, if used, includes MI, CVA, sudden death or even reaction to anesthetic medications also discussed.  Typical post-op recovery time and possbility of activity restrictions were also discussed.  Alternatives include no chemotherapy.  Benefits include curative intent for cancer.  The patient verbalized understanding and all questions were answered to the patient's satisfaction.  Will schedule for RIGHT lumpectomy, SLNB, and known metastatic LN excision. If intraop SLNB is positive for additional cancer, will proceed with full axillary dissection.

## 2020-01-17 NOTE — H&P (Signed)
Subjective:   CC: Breast cancer metastasized to axillary lymph node, right (CMS-HCC) [C50.911, C77.3] HPI:  Jessica [name] is a 44 y.o. female who was referred by Earlie Server, MD for evaluation of above. Change was noted during breast exam and last screening mammogram. Patient does not routinely do self breast exams.  Patient has noted a change on breast exam. Patient reports hormonal therapy. Patient is G4P2. Age of first live birth was 7. Patient did not breast feed. Patient denies nipple discharge. Patient denies previous breast biopsy. Patient denies a personal history of breast cancer.  She has since received port and finished her neoadjuvant chemo with decreased size of primary tumor and metastatic lymph node.  No additional lesions or suspicious nodes noted on recent imaging.    Current Medications: has a current medication list which includes the following prescription(s): ferrous sulfate, gabapentin, lidocaine-prilocaine, and omeprazole.  Allergies:  Allergies as of 01/17/2020  . (No Known Allergies)    ROS:  A 15 point review of systems was performed and was negative except as noted in HPI   Objective:     BP (!) 126/90   Pulse 85   Ht 157.5 cm ('5\' 2"' )   Wt 98.9 kg (218 lb)   BMI 39.87 kg/m   Constitutional :  alert, appears stated age, cooperative and no distress  Lymphatics/Throat:  no asymmetry, masses, or scars  Respiratory:  clear to auscultation bilaterally  Cardiovascular:  regular rate and rhythm  Gastrointestinal: soft, non-tender; bowel sounds normal; no masses,  no organomegaly.   Musculoskeletal: Steady gait and movement  Skin: Cool and moist, no surgical scars  Psychiatric: Normal affect, non-agitated, not confused  Breast:  Chaperone present for exam.  left breast normal without mass, skin or nipple changes or axillary nodes, right breast with normal, no obvious mass, and no obvious lymphadenopathy noted in right axilla.    LABS:  SURGICAL PATHOLOGY  SURGICAL PATHOLOGY CASE: [phone] PATIENT: Jessica [name] Surgical Pathology Report     Specimen Submitted: A. Breast, right, 5:00, 3 CMFN B. Axilla, right  Clinical History: Palpable right breast mass, highly suspicious. Markedly abnormal right axillary lymph node on diagnostic workup; Highly suspicious for malignancy, highly suspicious for metastatic lymph node      DIAGNOSIS: A. RIGHT BREAST, 5:00, 3CMFN; ULTRASOUND-GUIDED NEEDLE CORE BIOPSY: - INVASIVE MAMMARY CARCINOMA, NO SPECIAL TYPE.  Size of invasive carcinoma: 11 mm in this sample Histologic grade of invasive carcinoma: Grade 3            Glandular/tubular differentiation score: 2            Nuclear pleomorphism score: 3            Mitotic rate score: 3            Total score: 8 Ductal carcinoma in situ: Not identified Lymphovascular invasion: Not identified  ER/PR/HER2: Immunohistochemistry will be performed on block A1, with reflex to [place] for HER2 2+. The results will be reported in an addendum.  Comment: The definitive grade will be assigned on the excisional specimen. These findings were communicated to Electa Sniff, RN via [facility] secure chat on 07/28/19.  B. LYMPH NODE, RIGHT AXILLARY; ULTRASOUND-GUIDED NEEDLE CORE BIOPSY: - POSITIVE FOR METASTATIC CARCINOMA, 10 MM IN THIS SAMPLE.     GROSS DESCRIPTION: A. Labeled: Ultrasound-guided right breast core biopsy at 5 o'clock position and 3 cm from nipple Received: In formalin Time/date in fixative: Tissue procedure time 8:38 AM, tissue put in formalin time 8:30 AM  07/26/2019 Cold ischemic time: Less than minute Total fixation time: 9 hours Core pieces: 4 Size: 1.2 cm in length and 0.1 cm in diameter each Description: White soft tissue cord Ink color: Black Entirely submitted in 1 cassette.  [default value]. Labeled: Ultrasound-guided right axilla core biopsy Received: In formalin Time/date in fixative:  Tissue procedure time 8:54 AM, tissue put in formalin time 8:54 AM 07/26/2019 Cold ischemic time: Less than minute Total fixation time: 8 hours Core pieces: 4 Size: From 1.0-1.4 cm in length and 0.1 cm in diameter Description: White soft tissue cores Ink color: Blue Entirely submitted in 1 cassette.      Final Diagnosis performed by Betsy Pries, MD.  Electronically signed 07/28/2019 9:58:50AM The electronic signature indicates that the named Attending Pathologist has evaluated the specimen Technical component performed at [facility], [address] [place], [place] [address] Lab: [phone] Dir: Rush Farmer, MD, MMM  Professional component performed at [facility], [facility], [place], [place], [place] [address] Lab: [phone] Dir: Dellia Nims. Reuel Derby, MD      RADS: Addendum by Jamie Kato, MD on 07/28/2019  3:29 PM ADDENDUM REPORT: 07/28/2019 13:08  ADDENDUM: PATHOLOGY revealed: A. RIGHT BREAST, 5:00, 3CMFN; ULTRASOUND-GUIDED NEEDLE CORE BIOPSY: - INVASIVE MAMMARY CARCINOMA, NO SPECIAL TYPE. 11 mm in this sample. Grade 3. Ductal carcinoma in situ: Not identified. Lymphovascular invasion: Not identified. Comment: The definitive grade will be assigned on the excisional specimen.  B. LYMPH NODE, RIGHT AXILLARY; ULTRASOUND-GUIDED NEEDLE CORE BIOPSY: - POSITIVE FOR METASTATIC CARCINOMA, 10 MM IN THIS SAMPLE.  Pathology results are CONCORDANT with imaging findings, per Dr. Ammie Ferrier.  Pathology results were discussed with patient via telephone. The patient reported doing well after the biopsy with tenderness at the site. Post biopsy care instructions were reviewed and questions were answered. The patient was encouraged to call [facility] for any additional concerns.  Recommendation: Surgical referral. Request for surgical referral was relayed to nurse navigators at [facility] by Electa Sniff RN  on 07/28/2019.  Addendum by Electa Sniff RN on 07/28/2019.   Electronically Signed  By: Ammie Ferrier M.D.  On: 07/28/2019 13:08  Result Narrative  CLINICAL DATA: 44 year old female presenting for ultrasound-guided biopsy of a right breast mass and right axillary lymph node.  EXAM: ULTRASOUND GUIDED RIGHT BREAST CORE NEEDLE BIOPSY  COMPARISON: Previous exam(s).  FINDINGS: I met with the patient and we discussed the procedure of ultrasound-guided biopsy, including benefits and alternatives. We discussed the high likelihood of a successful procedure. We discussed the risks of the procedure, including infection, bleeding, tissue injury, clip migration, and inadequate sampling. Informed written consent was given. The usual time-out protocol was performed immediately prior to the procedure.  #1 Lesion quadrant: Lower-inner quadrant  Using sterile technique and 1% Lidocaine as local anesthetic, under direct ultrasound visualization, a 14 gauge spring-loaded device was used to perform biopsy of a mass in the right breast at 5 o'clock using an inferior approach. At the conclusion of the procedure a heart shaped tissue marker clip was deployed into the biopsy cavity.   --------------------------------------------------------------------------------------------------------------------------------------------  #2 Lesion quadrant: Right axilla  Using sterile technique and 1% Lidocaine as local anesthetic, under direct ultrasound visualization, a 14 gauge spring-loaded device was used to perform biopsy of a right axillary lymph node using an inferior approach. At the conclusion of the procedure a HydroMARK shape 3 tissue marker clip was deployed into the biopsy cavity.  Follow up 2 view mammogram was performed and dictated  separately.  IMPRESSION: 1. Ultrasound guided biopsy of a right breast mass at 5 o'clock. No apparent complications.  2. Ultrasound guided biopsy of a  right axillary lymph node. No apparent complications.  Electronically Signed: By: Ammie Ferrier M.D. On: 07/26/2019 09:12  Other Result Information  Interface, Rad Results In - 07/28/2019  3:29 PM EDT CLINICAL DATA:  44 year old female presenting for ultrasound-guided biopsy of a right breast mass and right axillary lymph node.  EXAM: ULTRASOUND GUIDED RIGHT BREAST CORE NEEDLE BIOPSY  COMPARISON:  Previous exam(s).  FINDINGS: I met with the patient and we discussed the procedure of ultrasound-guided biopsy, including benefits and alternatives. We discussed the high likelihood of a successful procedure. We discussed the risks of the procedure, including infection, bleeding, tissue injury, clip migration, and inadequate sampling. Informed written consent was given. The usual time-out protocol was performed immediately prior to the procedure.  #1 Lesion quadrant: Lower-inner quadrant  Using sterile technique and 1% Lidocaine as local anesthetic, under direct ultrasound visualization, a 14 gauge spring-loaded device was used to perform biopsy of a mass in the right breast at 5 o'clock using an inferior approach. At the conclusion of the procedure a heart shaped tissue marker clip was deployed into the biopsy cavity.   --------------------------------------------------------------------------------------------------------------------------------------------  #2 Lesion quadrant: Right axilla  Using sterile technique and 1% Lidocaine as local anesthetic, under direct ultrasound visualization, a 14 gauge spring-loaded device was used to perform biopsy of a right axillary lymph node using an inferior approach. At the conclusion of the procedure a HydroMARK shape 3 tissue marker clip was deployed into the biopsy cavity.  Follow up 2 view mammogram was performed and dictated separately.  IMPRESSION: 1. Ultrasound guided biopsy of a right breast mass at 5 o'clock. No apparent  complications.  2. Ultrasound guided biopsy of a right axillary lymph node. No apparent complications.  Electronically Signed: By: Ammie Ferrier M.D. On: 07/26/2019 09:12     Assessment:   Breast cancer metastasized to axillary lymph node, right (CMS-HCC) [C50.911, C77.3]  Plan:     1. Breast cancer metastasized to axillary lymph node, right (CMS-HCC) [C50.911, C77.3]   Discussed the risk of surgery including bleeding, chronic pain, post-op infxn, poor/delayed wound healing, poor cosmesis, seroma, hematoma formation, and possible re-operation to address said risks. The risks of general anesthetic, if used, includes MI, CVA, sudden death or even reaction to anesthetic medications also discussed.  Typical post-op recovery time and possbility of activity restrictions were also discussed.  Alternatives include no chemotherapy.  Benefits include curative intent for cancer.  The patient verbalized understanding and all questions were answered to the patient's satisfaction.  Will schedule for RIGHT lumpectomy, SLNB, and known metastatic LN excision. If intraop SLNB is positive for additional cancer, will proceed with full axillary dissection.

## 2020-01-23 ENCOUNTER — Encounter: Payer: Self-pay | Admitting: Oncology

## 2020-01-24 ENCOUNTER — Other Ambulatory Visit: Payer: Self-pay

## 2020-01-24 ENCOUNTER — Encounter
Admission: RE | Admit: 2020-01-24 | Discharge: 2020-01-24 | Disposition: A | Discharge status: Home / Self Care | Payer: BC Managed Care – PPO | Source: Ambulatory Visit | Attending: Surgery | Admitting: Surgery

## 2020-01-24 ENCOUNTER — Other Ambulatory Visit: Payer: Self-pay | Admitting: Surgery

## 2020-01-24 DIAGNOSIS — C773 Secondary and unspecified malignant neoplasm of axilla and upper limb lymph nodes: Secondary | ICD-10-CM

## 2020-01-24 DIAGNOSIS — C50011 Malignant neoplasm of nipple and areola, right female breast: Secondary | ICD-10-CM

## 2020-01-24 DIAGNOSIS — Z01812 Encounter for preprocedural laboratory examination: Secondary | ICD-10-CM | POA: Diagnosis not present

## 2020-01-24 HISTORY — DX: Personal history of urinary calculi: Z87.442

## 2020-01-24 HISTORY — DX: Gastro-esophageal reflux disease without esophagitis: K21.9

## 2020-01-24 HISTORY — DX: Unspecified osteoarthritis, unspecified site: M19.90

## 2020-01-24 HISTORY — DX: Anemia, unspecified: D64.9

## 2020-01-24 NOTE — Pre-Procedure Instructions (Signed)
ECG 12 Lead1/30/2020 [facility] Component Name Value Ref Range  EKG Systolic BP  mmHg  EKG Diastolic BP  mmHg  EKG Ventricular Rate 77 BPM  EKG Atrial Rate 77 BPM  EKG P-R Interval 130 ms  EKG QRS Duration 84 ms  EKG Q-T Interval 366 ms  EKG QTC Calculation 414 ms  EKG Calculated P Axis 7 degrees  EKG Calculated R Axis 46 degrees  EKG Calculated T Axis 16 degrees  QTC Fredericia 397 ms  Result Narrative  NORMAL SINUS RHYTHM NORMAL ECG NO PREVIOUS ECGS AVAILABLE Confirmed by Hunt Oris ([id number]) on 12/01/2018 7:30:46 AM  Other Result Information  Interface, Rad Results In - 12/01/2018  7:30 AM EST NORMAL SINUS RHYTHM NORMAL ECG NO PREVIOUS ECGS AVAILABLE Confirmed by Hunt Oris ([id number]) on 12/01/2018 7:30:46 AM  Status Results Details   Encounter

## 2020-01-25 NOTE — Patient Instructions (Addendum)
Your procedure is scheduled on: 02-01-20 THURSDAY Report to [facility] (2ND DESK ON RIGHT)-DR SAKAI'S OFFICE SAID [facility] WILL TELL YOU ON TUESDAY (3-30) WHAT TIME YOU WILL NEED TO ARRIVE TO RADIOLOGY ON 02-01-20  Remember: Instructions that are not followed completely may result in serious medical risk, up to and including death, or upon the discretion of your surgeon and anesthesiologist your surgery may need to be rescheduled.    _x___ 1. Do not eat food after midnight the night before your procedure. NO GUM OR CANDY AFTER MIDNIGHT. You may drink clear liquids up to 2 hours before you are scheduled to arrive at the hospital for your procedure.  Do not drink clear liquids within 2 hours of your scheduled arrival to the hospital.  Clear liquids include  --Water or Apple juice without pulp  --Gatorade  --Black Coffee or Clear Tea (No milk, no creamers, do not add anything to the coffee or Tea   ____Ensure clear carbohydrate drink on the way to the hospital for bariatric patients  ____Ensure clear carbohydrate drink 3 hours before surgery.     __x__ 2. No Alcohol for 24 hours before or after surgery.   __x__3. No Smoking or e-cigarettes for 24 prior to surgery.  Do not use any chewable tobacco products for at least 6 hour prior to surgery   ____  4. Bring all medications with you on the day of surgery if instructed.    __x__ 5. Notify your doctor if there is any change in your medical condition     (cold, fever, infections).    x___6. On the morning of surgery brush your teeth with toothpaste and water.  You may rinse your mouth with mouth wash if you wish.  Do not swallow any toothpaste or mouthwash.   Do not wear jewelry, make-up, hairpins, clips or nail polish.  Do not wear lotions, powders, or perfumes. You may wear deodorant.  Do not shave 48 hours prior to surgery. Men may shave face and neck.  Do not bring valuables to the hospital.    [facility] is not responsible for any belongings or valuables.               Contacts, dentures or bridgework may not be worn into surgery.  Leave your suitcase in the car. After surgery it may be brought to your room.  For patients admitted to the hospital, discharge time is determined by your treatment team.  _  Patients discharged the day of surgery will not be allowed to drive home.  You will need someone to drive you home and stay with you the night of your procedure.    Please read over the following fact sheets that you were given:   [facility] Preparing for Surgery  _x___ TAKE THE FOLLOWING MEDICATION THE MORNING OF SURGERY WITH A SMALL SIP OF WATER. These include:  1. GABAPENTIN (NEURONTIN)  2. PRILOSEC (OMEPRAZOLE)  3. TAKE A PRILOSEC THE NIGHT BEFORE YOUR SURGERY  4.  5.  6.  ____Fleets enema or Magnesium Citrate as directed.   _x___ Use CHG Soap or sage wipes as directed on instruction sheet   ____ Use inhalers on the day of surgery and bring to hospital day of surgery  ____ Stop Metformin and Janumet 2 days prior to surgery.    ____ Take 1/2 of usual insulin dose the night before surgery and none on the morning surgery.   ____ Follow recommendations from  Cardiologist, Pulmonologist or PCP regarding stopping Aspirin, Coumadin, Plavix ,Eliquis, Effient, or Pradaxa, and Pletal.  X____Stop Anti-inflammatories such as Advil, Aleve, Ibuprofen, Motrin, Naproxen, Naprosyn, Goodies powders or aspirin products NOW-OK to take Tylenol    ____ Stop supplements until after surgery.     ____ Bring C-Pap to the hospital.

## 2020-01-30 ENCOUNTER — Other Ambulatory Visit
Admission: RE | Admit: 2020-01-30 | Discharge: 2020-01-30 | Disposition: A | Discharge status: Home / Self Care | Payer: BC Managed Care – PPO | Source: Ambulatory Visit | Attending: Surgery | Admitting: Surgery

## 2020-01-30 ENCOUNTER — Ambulatory Visit
Admission: RE | Admit: 2020-01-30 | Discharge: 2020-01-30 | Disposition: A | Discharge status: Home / Self Care | Payer: BC Managed Care – PPO | Source: Ambulatory Visit | Attending: Surgery | Admitting: Surgery

## 2020-01-30 ENCOUNTER — Other Ambulatory Visit: Payer: Self-pay

## 2020-01-30 ENCOUNTER — Other Ambulatory Visit: Payer: Self-pay | Admitting: Surgery

## 2020-01-30 DIAGNOSIS — Z01812 Encounter for preprocedural laboratory examination: Secondary | ICD-10-CM | POA: Insufficient documentation

## 2020-01-30 DIAGNOSIS — C50011 Malignant neoplasm of nipple and areola, right female breast: Secondary | ICD-10-CM

## 2020-01-30 DIAGNOSIS — C773 Secondary and unspecified malignant neoplasm of axilla and upper limb lymph nodes: Secondary | ICD-10-CM

## 2020-01-30 DIAGNOSIS — Z20822 Contact with and (suspected) exposure to covid-19: Secondary | ICD-10-CM | POA: Diagnosis not present

## 2020-01-30 LAB — SARS CORONAVIRUS 2 (TAT 6-24 HRS): SARS Coronavirus 2: NEGATIVE

## 2020-02-01 ENCOUNTER — Other Ambulatory Visit: Payer: Self-pay

## 2020-02-01 ENCOUNTER — Ambulatory Visit: Payer: BC Managed Care – PPO | Admitting: Anesthesiology

## 2020-02-01 ENCOUNTER — Ambulatory Visit
Admission: RE | Admit: 2020-02-01 | Discharge: 2020-02-01 | Disposition: A | Discharge status: Home / Self Care | Payer: BC Managed Care – PPO | Attending: Surgery | Admitting: Surgery

## 2020-02-01 ENCOUNTER — Encounter
Admission: RE | Disposition: A | Discharge status: Home / Self Care | Payer: Self-pay | Source: Home / Self Care | Attending: Surgery

## 2020-02-01 ENCOUNTER — Ambulatory Visit
Admission: RE | Admit: 2020-02-01 | Discharge: 2020-02-01 | Disposition: A | Discharge status: Home / Self Care | Payer: BC Managed Care – PPO | Source: Ambulatory Visit | Attending: Surgery | Admitting: Surgery

## 2020-02-01 ENCOUNTER — Encounter: Payer: Self-pay | Admitting: Surgery

## 2020-02-01 DIAGNOSIS — C50011 Malignant neoplasm of nipple and areola, right female breast: Secondary | ICD-10-CM

## 2020-02-01 DIAGNOSIS — K219 Gastro-esophageal reflux disease without esophagitis: Secondary | ICD-10-CM | POA: Diagnosis not present

## 2020-02-01 DIAGNOSIS — Z6839 Body mass index (BMI) 39.0-39.9, adult: Secondary | ICD-10-CM | POA: Diagnosis not present

## 2020-02-01 DIAGNOSIS — Z79899 Other long term (current) drug therapy: Secondary | ICD-10-CM | POA: Diagnosis not present

## 2020-02-01 DIAGNOSIS — C773 Secondary and unspecified malignant neoplasm of axilla and upper limb lymph nodes: Secondary | ICD-10-CM

## 2020-02-01 DIAGNOSIS — M1990 Unspecified osteoarthritis, unspecified site: Secondary | ICD-10-CM | POA: Insufficient documentation

## 2020-02-01 DIAGNOSIS — Z170 Estrogen receptor positive status [ER+]: Secondary | ICD-10-CM

## 2020-02-01 DIAGNOSIS — C50911 Malignant neoplasm of unspecified site of right female breast: Secondary | ICD-10-CM | POA: Diagnosis not present

## 2020-02-01 DIAGNOSIS — C50311 Malignant neoplasm of lower-inner quadrant of right female breast: Secondary | ICD-10-CM

## 2020-02-01 HISTORY — PX: AXILLARY LYMPH NODE DISSECTION: SHX5229

## 2020-02-01 HISTORY — PX: BREAST LUMPECTOMY: SHX2

## 2020-02-01 LAB — POCT PREGNANCY, URINE: Preg Test, Ur: NEGATIVE

## 2020-02-01 SURGERY — PART MASTECTOMY,RADIO FREQUENCY LOCALIZER,AXILLARY SENTINEL NODE BIOPSY
Anesthesia: General | Site: Breast | Laterality: Right

## 2020-02-01 MED ORDER — HYDROCODONE-ACETAMINOPHEN 5-325 MG PO TABS: 1.0000 | ORAL_TABLET | Freq: Once | ORAL | Status: AC

## 2020-02-01 MED ORDER — FENTANYL CITRATE (PF) 100 MCG/2ML IJ SOLN
INTRAMUSCULAR | Status: AC
  Filled 2020-02-01: qty 2

## 2020-02-01 MED ORDER — ACETAMINOPHEN 500 MG PO TABS
ORAL_TABLET | ORAL | Status: AC
  Administered 2020-02-01: 08:00:00 1000 mg via ORAL
  Filled 2020-02-01: qty 2

## 2020-02-01 MED ORDER — ACETAMINOPHEN 500 MG PO TABS: 1000.0000 mg | ORAL_TABLET | ORAL | Status: AC

## 2020-02-01 MED ORDER — EPHEDRINE 5 MG/ML INJ
INTRAVENOUS | Status: AC
  Filled 2020-02-01: qty 10

## 2020-02-01 MED ORDER — SUGAMMADEX SODIUM 500 MG/5ML IV SOLN
INTRAVENOUS | Status: DC | PRN
  Administered 2020-02-01: 200 mg via INTRAVENOUS
  Administered 2020-02-01: 500 mg via INTRAVENOUS

## 2020-02-01 MED ORDER — CELECOXIB 200 MG PO CAPS
ORAL_CAPSULE | ORAL | Status: AC
  Administered 2020-02-01: 200 mg via ORAL
  Filled 2020-02-01: qty 1

## 2020-02-01 MED ORDER — HYDROCODONE-ACETAMINOPHEN 5-325 MG PO TABS: 1.0000 | ORAL_TABLET | Freq: Four times a day (QID) | ORAL | 0 refills | Status: DC | PRN

## 2020-02-01 MED ORDER — TECHNETIUM TC 99M SULFUR COLLOID FILTERED
0.9060 | Freq: Once | INTRAVENOUS | Status: AC | PRN
  Administered 2020-02-01: 08:00:00 0.906 via INTRADERMAL

## 2020-02-01 MED ORDER — DOCUSATE SODIUM 100 MG PO CAPS: 100.0000 mg | ORAL_CAPSULE | Freq: Two times a day (BID) | ORAL | 0 refills | Status: AC | PRN

## 2020-02-01 MED ORDER — ACETAMINOPHEN 325 MG PO TABS: 650.0000 mg | ORAL_TABLET | Freq: Three times a day (TID) | ORAL | 0 refills | Status: AC | PRN

## 2020-02-01 MED ORDER — PROMETHAZINE HCL 25 MG/ML IJ SOLN: 6.2500 mg | INTRAMUSCULAR | Status: DC | PRN

## 2020-02-01 MED ORDER — EPHEDRINE SULFATE 50 MG/ML IJ SOLN
INTRAMUSCULAR | Status: DC | PRN
  Administered 2020-02-01: 10 mg via INTRAVENOUS

## 2020-02-01 MED ORDER — HYDROMORPHONE HCL 1 MG/ML IJ SOLN
INTRAMUSCULAR | Status: DC | PRN
  Administered 2020-02-01 (×2): .5 mg via INTRAVENOUS

## 2020-02-01 MED ORDER — DIPHENHYDRAMINE HCL 50 MG/ML IJ SOLN
INTRAMUSCULAR | Status: DC | PRN
  Administered 2020-02-01: 25 mg via INTRAVENOUS

## 2020-02-01 MED ORDER — CHLORHEXIDINE GLUCONATE CLOTH 2 % EX PADS
6.0000 | MEDICATED_PAD | Freq: Once | CUTANEOUS | Status: AC
  Administered 2020-02-01: 6 via TOPICAL

## 2020-02-01 MED ORDER — DEXMEDETOMIDINE HCL IN NACL 80 MCG/20ML IV SOLN
INTRAVENOUS | Status: AC
  Filled 2020-02-01: qty 20

## 2020-02-01 MED ORDER — CEFAZOLIN SODIUM-DEXTROSE 2-4 GM/100ML-% IV SOLN
2.0000 g | INTRAVENOUS | Status: AC
  Administered 2020-02-01: 2 g via INTRAVENOUS

## 2020-02-01 MED ORDER — LIDOCAINE HCL (CARDIAC) PF 100 MG/5ML IV SOSY
PREFILLED_SYRINGE | INTRAVENOUS | Status: DC | PRN
  Administered 2020-02-01: 100 mg via INTRAVENOUS

## 2020-02-01 MED ORDER — MIDAZOLAM HCL 2 MG/2ML IJ SOLN
INTRAMUSCULAR | Status: AC
  Filled 2020-02-01: qty 2

## 2020-02-01 MED ORDER — LACTATED RINGERS IV SOLN: INTRAVENOUS | Status: DC

## 2020-02-01 MED ORDER — DEXMEDETOMIDINE HCL 200 MCG/2ML IV SOLN
INTRAVENOUS | Status: DC | PRN
  Administered 2020-02-01: 12 ug via INTRAVENOUS
  Administered 2020-02-01: 16 ug via INTRAVENOUS

## 2020-02-01 MED ORDER — DEXAMETHASONE SODIUM PHOSPHATE 10 MG/ML IJ SOLN
INTRAMUSCULAR | Status: DC | PRN
  Administered 2020-02-01: 10 mg via INTRAVENOUS

## 2020-02-01 MED ORDER — SEVOFLURANE IN SOLN
RESPIRATORY_TRACT | Status: AC
  Filled 2020-02-01: qty 250

## 2020-02-01 MED ORDER — GABAPENTIN 300 MG PO CAPS
ORAL_CAPSULE | ORAL | Status: AC
  Administered 2020-02-01: 300 mg via ORAL
  Filled 2020-02-01: qty 1

## 2020-02-01 MED ORDER — FENTANYL CITRATE (PF) 100 MCG/2ML IJ SOLN
25.0000 ug | INTRAMUSCULAR | Status: DC | PRN
  Administered 2020-02-01: 25 ug via INTRAVENOUS
  Administered 2020-02-01: 50 ug via INTRAVENOUS

## 2020-02-01 MED ORDER — LIDOCAINE HCL (PF) 1 % IJ SOLN
INTRAMUSCULAR | Status: DC | PRN
  Administered 2020-02-01: 19 mL

## 2020-02-01 MED ORDER — CEFAZOLIN SODIUM-DEXTROSE 2-4 GM/100ML-% IV SOLN
INTRAVENOUS | Status: AC
  Filled 2020-02-01: qty 100

## 2020-02-01 MED ORDER — BUPIVACAINE HCL (PF) 0.5 % IJ SOLN
INTRAMUSCULAR | Status: AC
  Filled 2020-02-01: qty 30

## 2020-02-01 MED ORDER — FENTANYL CITRATE (PF) 100 MCG/2ML IJ SOLN
INTRAMUSCULAR | Status: AC
  Administered 2020-02-01: 50 ug via INTRAVENOUS
  Filled 2020-02-01: qty 2

## 2020-02-01 MED ORDER — EPINEPHRINE PF 1 MG/ML IJ SOLN
INTRAMUSCULAR | Status: AC
  Filled 2020-02-01: qty 1

## 2020-02-01 MED ORDER — BUPIVACAINE-EPINEPHRINE 0.5% -1:200000 IJ SOLN
INTRAMUSCULAR | Status: DC | PRN
  Administered 2020-02-01: 19 mL

## 2020-02-01 MED ORDER — LIDOCAINE HCL (PF) 1 % IJ SOLN
INTRAMUSCULAR | Status: AC
  Filled 2020-02-01: qty 30

## 2020-02-01 MED ORDER — ROCURONIUM BROMIDE 100 MG/10ML IV SOLN
INTRAVENOUS | Status: DC | PRN
  Administered 2020-02-01: 20 mg via INTRAVENOUS
  Administered 2020-02-01: 50 mg via INTRAVENOUS
  Administered 2020-02-01: 20 mg via INTRAVENOUS
  Administered 2020-02-01: 30 mg via INTRAVENOUS
  Administered 2020-02-01: 20 mg via INTRAVENOUS

## 2020-02-01 MED ORDER — IPRATROPIUM-ALBUTEROL 0.5-2.5 (3) MG/3ML IN SOLN: 3.0000 mL | Freq: Once | RESPIRATORY_TRACT | Status: AC

## 2020-02-01 MED ORDER — DIPHENHYDRAMINE HCL 50 MG/ML IJ SOLN
INTRAMUSCULAR | Status: AC
  Filled 2020-02-01: qty 1

## 2020-02-01 MED ORDER — ROCURONIUM BROMIDE 10 MG/ML (PF) SYRINGE
PREFILLED_SYRINGE | INTRAVENOUS | Status: AC
  Filled 2020-02-01: qty 20

## 2020-02-01 MED ORDER — LACTATED RINGERS IV SOLN: INTRAVENOUS | Status: DC | PRN

## 2020-02-01 MED ORDER — ONDANSETRON HCL 4 MG/2ML IJ SOLN
INTRAMUSCULAR | Status: AC
  Filled 2020-02-01: qty 2

## 2020-02-01 MED ORDER — CELECOXIB 200 MG PO CAPS: 200.0000 mg | ORAL_CAPSULE | ORAL | Status: AC

## 2020-02-01 MED ORDER — MIDAZOLAM HCL 2 MG/2ML IJ SOLN
INTRAMUSCULAR | Status: DC | PRN
  Administered 2020-02-01: 2 mg via INTRAVENOUS

## 2020-02-01 MED ORDER — IBUPROFEN 800 MG PO TABS: 800.0000 mg | ORAL_TABLET | Freq: Three times a day (TID) | ORAL | 0 refills | Status: DC | PRN

## 2020-02-01 MED ORDER — HYDROMORPHONE HCL 1 MG/ML IJ SOLN
INTRAMUSCULAR | Status: AC
  Filled 2020-02-01: qty 1

## 2020-02-01 MED ORDER — GABAPENTIN 300 MG PO CAPS: 300.0000 mg | ORAL_CAPSULE | ORAL | Status: AC

## 2020-02-01 MED ORDER — HYDROCODONE-ACETAMINOPHEN 5-325 MG PO TABS
ORAL_TABLET | ORAL | Status: AC
  Administered 2020-02-01: 1 via ORAL
  Filled 2020-02-01: qty 1

## 2020-02-01 MED ORDER — FENTANYL CITRATE (PF) 100 MCG/2ML IJ SOLN
INTRAMUSCULAR | Status: AC
  Administered 2020-02-01: 25 ug via INTRAVENOUS
  Filled 2020-02-01: qty 2

## 2020-02-01 MED ORDER — IPRATROPIUM-ALBUTEROL 0.5-2.5 (3) MG/3ML IN SOLN
RESPIRATORY_TRACT | Status: AC
  Administered 2020-02-01: 3 mL via RESPIRATORY_TRACT
  Filled 2020-02-01: qty 3

## 2020-02-01 MED ORDER — FENTANYL CITRATE (PF) 100 MCG/2ML IJ SOLN
INTRAMUSCULAR | Status: DC | PRN
  Administered 2020-02-01 (×2): 50 ug via INTRAVENOUS

## 2020-02-01 MED ORDER — LIDOCAINE HCL (PF) 2 % IJ SOLN
INTRAMUSCULAR | Status: AC
  Filled 2020-02-01: qty 10

## 2020-02-01 MED ORDER — DEXAMETHASONE SODIUM PHOSPHATE 10 MG/ML IJ SOLN
INTRAMUSCULAR | Status: AC
  Filled 2020-02-01: qty 1

## 2020-02-01 MED ORDER — ONDANSETRON HCL 4 MG/2ML IJ SOLN
INTRAMUSCULAR | Status: DC | PRN
  Administered 2020-02-01: 4 mg via INTRAVENOUS

## 2020-02-01 MED ORDER — OMEPRAZOLE 20 MG PO CPDR: 20.0000 mg | DELAYED_RELEASE_CAPSULE | Freq: Every day | ORAL | Status: DC | PRN

## 2020-02-01 MED ORDER — PROPOFOL 10 MG/ML IV BOLUS
INTRAVENOUS | Status: DC | PRN
  Administered 2020-02-01: 200 mg via INTRAVENOUS

## 2020-02-01 SURGICAL SUPPLY — 52 items
APPLIER CLIP 11 MED OPEN (CLIP)
BLADE SURG 15 STRL LF DISP TIS (BLADE) ×2 IMPLANT
BLADE SURG 15 STRL SS (BLADE) ×3
CANISTER SUCT 1200ML W/VALVE (MISCELLANEOUS) ×3 IMPLANT
CHLORAPREP W/TINT 26 (MISCELLANEOUS) ×3 IMPLANT
CLIP APPLIE 11 MED OPEN (CLIP) IMPLANT
CNTNR SPEC 2.5X3XGRAD LEK (MISCELLANEOUS) ×2
CONT SPEC 4OZ STER OR WHT (MISCELLANEOUS) ×1
CONT SPEC 4OZ STRL OR WHT (MISCELLANEOUS) ×2
CONTAINER SPEC 2.5X3XGRAD LEK (MISCELLANEOUS) ×2 IMPLANT
COVER WAND RF STERILE (DRAPES) ×3 IMPLANT
DERMABOND ADVANCED (GAUZE/BANDAGES/DRESSINGS) ×1
DERMABOND ADVANCED .7 DNX12 (GAUZE/BANDAGES/DRESSINGS) ×2 IMPLANT
DEVICE DISSECT PLASMABLAD 3.0S (MISCELLANEOUS) IMPLANT
DEVICE DSSCT PLSMBLD 3.0S LGHT (MISCELLANEOUS) ×2 IMPLANT
DEVICE DUBIN SPECIMEN MAMMOGRA (MISCELLANEOUS) ×3 IMPLANT
DRAPE LAPAROTOMY TRNSV 106X77 (MISCELLANEOUS) ×3 IMPLANT
ELECT CAUTERY BLADE TIP 2.5 (TIP) ×9
ELECT REM PT RETURN 9FT ADLT (ELECTROSURGICAL) ×3
ELECTRODE CAUTERY BLDE TIP 2.5 (TIP) ×2 IMPLANT
ELECTRODE REM PT RTRN 9FT ADLT (ELECTROSURGICAL) ×2 IMPLANT
GLOVE BIOGEL PI IND STRL 7.0 (GLOVE) ×2 IMPLANT
GLOVE BIOGEL PI INDICATOR 7.0 (GLOVE) ×3
GLOVE SURG SYN 6.5 ES PF (GLOVE) ×3 IMPLANT
GLOVE SURG SYN 6.5 PF PI (GLOVE) ×2 IMPLANT
GOWN STRL REUS W/ TWL LRG LVL3 (GOWN DISPOSABLE) ×6 IMPLANT
GOWN STRL REUS W/TWL LRG LVL3 (GOWN DISPOSABLE) ×9
JACKSON PRATT 10 (INSTRUMENTS) IMPLANT
KIT MARKER MARGIN INK (KITS) ×1 IMPLANT
KIT TURNOVER KIT A (KITS) ×3 IMPLANT
LABEL OR SOLS (LABEL) ×3 IMPLANT
LIGHT WAVEGUIDE WIDE FLAT (MISCELLANEOUS) ×1 IMPLANT
MARKER MARGIN CORRECT CLIP (MARKER) ×1 IMPLANT
NEEDLE HYPO 22GX1.5 SAFETY (NEEDLE) ×6 IMPLANT
PACK BASIN MINOR ARMC (MISCELLANEOUS) ×3 IMPLANT
PLASMABLADE 3.0S (MISCELLANEOUS) ×3
PLASMABLADE 3.0S W/LIGHT (MISCELLANEOUS)
SET LOCALIZER 20 PROBE US (MISCELLANEOUS) ×3 IMPLANT
SLEVE PROBE SENORX GAMMA FIND (MISCELLANEOUS) ×3 IMPLANT
SPONGE LAP 18X18 RF (DISPOSABLE) ×1 IMPLANT
SUT MNCRL 4-0 (SUTURE) ×9
SUT MNCRL 4-0 27XMFL (SUTURE) ×6
SUT SILK 2 0 (SUTURE)
SUT SILK 2-0 30XBRD TIE 12 (SUTURE) IMPLANT
SUT SILK 3 0 12 30 (SUTURE) IMPLANT
SUT SILK 3-0 (SUTURE) ×1 IMPLANT
SUT VIC AB 3-0 SH 27 (SUTURE) ×9
SUT VIC AB 3-0 SH 27X BRD (SUTURE) ×4 IMPLANT
SUTURE MNCRL 4-0 27XMF (SUTURE) ×4 IMPLANT
SYR 20ML LL LF (SYRINGE) ×3 IMPLANT
TUBING CONNECTING 10 (TUBING) ×2 IMPLANT
WATER STERILE IRR 1000ML POUR (IV SOLUTION) ×3 IMPLANT

## 2020-02-01 NOTE — Discharge Instructions (Addendum)
AMBULATORY SURGERY  DISCHARGE INSTRUCTIONS   1) The drugs that you were given will stay in your system until tomorrow so for the next 24 hours you should not:  A) Drive an automobile B) Make any legal decisions C) Drink any alcoholic beverage   2) You may resume regular meals tomorrow.  Today it is better to start with liquids and gradually work up to solid foods.  You may eat anything you prefer, but it is better to start with liquids, then soup and crackers, and gradually work up to solid foods.   3) Please notify your doctor immediately if you have any unusual bleeding, trouble breathing, redness and pain at the surgery site, drainage, fever, or pain not relieved by medication.    4) Additional Instructions:        Please contact your physician with any problems or Same Day Surgery at [phone], Monday through Friday 6 am to 4 pm, or [place] at [facility] number at [phone].Removal, Care After This sheet gives you information about how to care for yourself after your procedure. Your health care provider may also give you more specific instructions. If you have problems or questions, contact your health care provider. What can I expect after the procedure? After the procedure, it is common to have:  Soreness.  Bruising.  Itching. Follow these instructions at home: site care Follow instructions from your health care provider about how to take care of your site. Make sure you:  Wash your hands with soap and water before and after you change your bandage (dressing). If soap and water are not available, use hand sanitizer.  Leave stitches (sutures), skin glue, or adhesive strips in place. These skin closures may need to stay in place for 2 weeks or longer. If adhesive strip edges start to loosen and curl up, you may trim the loose edges. Do not remove adhesive strips completely unless your health care provider tells you to do that.  If the area bleeds or  bruises, apply gentle pressure for 10 minutes.  OK TO SHOWER IN 24HRS  Check your site every day for signs of infection. Check for:  Redness, swelling, or pain.  Fluid or blood.  Warmth.  Pus or a bad smell.  General instructions  Rest and then return to your normal activities as told by your health care provider. .  tylenol and advil as needed for discomfort.  Please alternate between the two every four hours as needed for pain.   .  Use narcotics, if prescribed, only when tylenol and motrin is not enough to control pain. .  325-650mg  every 8hrs to max of 3000mg /24hrs (including the 325mg  in every norco dose) for the tylenol.   .  Advil up to 800mg  per dose every 8hrs as needed for pain.    Keep all follow-up visits as told by your health care provider. This is important. Contact a health care provider if:  You have redness, swelling, or pain around your site.  You have fluid or blood coming from your site.  Your site feels warm to the touch.  You have pus or a bad smell coming from your site.  You have a fever.  Your sutures, skin glue, or adhesive strips loosen or come off sooner than expected. Get help right away if:  You have bleeding that does not stop with pressure or a dressing. Summary  After the procedure, it is common to have some soreness, bruising, and itching at the site.  Follow instructions from your health care provider about how to take care of your site.  Check your site every day for signs of infection.  Contact a health care provider if you have redness, swelling, or pain around your site, or your site feels warm to the touch.  Keep all follow-up visits as told by your health care provider. This is important. This information is not intended to replace advice given to you by your health care provider. Make sure you discuss any questions you have with your health care provider. Document Released: 11/15/2015 Document Revised: 04/18/2018 Document  Reviewed: 04/18/2018 Elsevier Interactive Patient Education  2019 [place]   5) The drugs that you were given will stay in your system until tomorrow so for the next 24 hours you should not:  D) Drive an automobile E) Make any legal decisions F) Drink any alcoholic beverage   6) You may resume regular meals tomorrow.  Today it is better to start with liquids and gradually work up to solid foods.  You may eat anything you prefer, but it is better to start with liquids, then soup and crackers, and gradually work up to solid foods.   7) Please notify your doctor immediately if you have any unusual bleeding, trouble breathing, redness and pain at the surgery site, drainage, fever, or pain not relieved by medication.    8) Additional Instructions:        Please contact your physician with any problems or Same Day Surgery at [phone], Monday through Friday 6 am to 4 pm, or [place] at [facility] number at [phone].

## 2020-02-01 NOTE — Anesthesia Preprocedure Evaluation (Signed)
Anesthesia Evaluation  Patient identified by MRN, date of birth, ID band Patient awake    Reviewed: Allergy & Precautions, H&P , NPO status , Patient's Chart, lab work & pertinent test results  History of Anesthesia Complications Negative for: history of anesthetic complications  Airway Mallampati: III  TM Distance: <3 FB Neck ROM: full    Dental  (+) Chipped, Dental Advidsory Given   Pulmonary neg pulmonary ROS, neg shortness of breath,           Cardiovascular Exercise Tolerance: Good (-) angina(-) Past MI and (-) DOE negative cardio ROS       Neuro/Psych negative neurological ROS  negative psych ROS   GI/Hepatic Neg liver ROS, GERD  ,  Endo/Other  neg diabetesMorbid obesity  Renal/GU Renal disease (kidney stones)  negative genitourinary   Musculoskeletal  (+) Arthritis ,   Abdominal   Peds  Hematology negative hematology ROS (+)   Anesthesia Other Findings Past Medical History: No date: Cancer ([place])     Comment:  breast cancer No date: Frequency No date: Kidney stones No date: Obesity No date: Right flank pain  Past Surgical History: No date: CESAREAN SECTION     Comment:  x 2 No date: CHOLECYSTECTOMY No date: DILATION AND CURETTAGE OF UTERUS No date: TUBAL LIGATION     Reproductive/Obstetrics negative OB ROS                             Anesthesia Physical  Anesthesia Plan  ASA: III  Anesthesia Plan: General   Post-op Pain Management:    Induction: Intravenous  PONV Risk Score and Plan: Dexamethasone, Ondansetron, Midazolam and Treatment may vary due to age or medical condition  Airway Management Planned: Oral ETT  Additional Equipment:   Intra-op Plan:   Post-operative Plan: Extubation in OR  Informed Consent: I have reviewed the patients History and Physical, chart, labs and discussed the procedure including the risks, benefits and alternatives for the  proposed anesthesia with the patient or authorized representative who has indicated his/her understanding and acceptance.     Dental Advisory Given  Plan Discussed with: Anesthesiologist, CRNA and Surgeon  Anesthesia Plan Comments: (Patient consented for risks of anesthesia including but not limited to:  - adverse reactions to medications - risk of intubation if required - damage to teeth, lips or other oral mucosa - sore throat or hoarseness - Damage to heart, brain, lungs or loss of life  Patient voiced understanding.)        Anesthesia Quick Evaluation

## 2020-02-01 NOTE — Progress Notes (Signed)
Family updated at 15 per md spoke to dawn (sister)

## 2020-02-01 NOTE — Interval H&P Note (Signed)
History and Physical Interval Note:  02/01/2020 8:19 AM  Jessica [name]  has presented today for surgery, with the diagnosis of C50.911 Breast cancer metastasized to axillary lymph node C77.3 Sentinel lymph node biopsy.  The various methods of treatment have been discussed with the patient and family. After consideration of risks, benefits and other options for treatment, the patient has consented to  Procedure(s): PARTIAL MASTECTOMY WITH RADIO FREQUENCY LOCALIZER AND AXILLARY SENTINEL LYMPH NODE BIOPSY (Right) AXILLARY LYMPH NODE DISSECTION (Right) as a surgical intervention.  The patient's history has been reviewed, patient examined, no change in status, stable for surgery.  I have reviewed the patient's chart and labs.  Questions were answered to the patient's satisfaction.     Vella Colquitt Lysle Pearl

## 2020-02-01 NOTE — Anesthesia Procedure Notes (Signed)
Procedure Name: Intubation Date/Time: 02/01/2020 9:03 AM Performed by: Justus Memory, CRNA Pre-anesthesia Checklist: Patient identified, Patient being monitored, Timeout performed, Emergency Drugs available and Suction available Patient Re-evaluated:Patient Re-evaluated prior to induction Oxygen Delivery Method: Circle system utilized Preoxygenation: Pre-oxygenation with 100% oxygen Induction Type: IV induction Ventilation: Mask ventilation without difficulty Laryngoscope Size: 3 and McGraph Grade View: Grade I Tube type: Oral Tube size: 7.0 mm Number of attempts: 1 Airway Equipment and Method: Stylet Placement Confirmation: ETT inserted through vocal cords under direct vision,  positive ETCO2 and breath sounds checked- equal and bilateral Secured at: 21 cm Tube secured with: Tape Dental Injury: Teeth and Oropharynx as per pre-operative assessment

## 2020-02-01 NOTE — Transfer of Care (Addendum)
Immediate Anesthesia Transfer of Care Note  Patient: Jessica [name]  Procedure(s) Performed: PARTIAL MASTECTOMY WITH RADIO FREQUENCY LOCALIZER AND AXILLARY SENTINEL LYMPH NODE BIOPSY (Right Breast) AXILLARY LYMPH NODE DISSECTION (Right )  Patient Location: PACU  Anesthesia Type:General  Level of Consciousness: sedated  Airway & Oxygen Therapy: Patient Spontanous Breathing and Patient connected to face mask oxygen  Post-op Assessment: Report given to RN and Post -op Vital signs reviewed and stable  Post vital signs: Reviewed and stable  Last Vitals:  Vitals Value Taken Time  BP 136/59 02/01/20 1326  Temp 36.5 C 02/01/20 1326  Pulse 111 02/01/20 1335  Resp 20 02/01/20 1334  SpO2 98 % 02/01/20 1335  Vitals shown include unvalidated device data.  Last Pain:  Vitals:   02/01/20 1326  TempSrc:   PainSc: Asleep      Patients Stated Pain Goal: 0 ([id number])  Complications: No apparent anesthesia complications

## 2020-02-01 NOTE — Op Note (Signed)
Preoperative diagnosis: Right breast carcinoma.  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   Procedure: RF localized right breast partial mastectomy.                      Right axillary Sentinel Lymph node biopsy, subsequent full right axillary lymph node dissection  Anesthesia: GETA  Surgeon: Dr. Benjamine Sprague Assistant: Peyton Najjar for better exposure  Wound Classification: Clean  Indications: Patient is a 44 y.o. female with a nonpalpable right breast mass noted on mammography with core biopsy demonstrating breast carcinoma as well as abnormal lymph node on ultrasound confirming metastasis.  She has undergone neoadjuvant therapy with imaging showing promising response to her therapy.  She is now here for a partial mastectomy and sentinel lymph node biopsy of the previously known lymph node.    Specimen: Right breast mass, Sentinel Lymph nodes x 2, right axillary tissue, anterior margin  Complications: None  Estimated Blood Loss: 50 mL  Findings: 1. Specimen mammography shows marker in specimen 2. Pathology call refers gross examination of margins was negative but very close at the anterior margin with extensive scarring 3.  Intra-Op evaluation of previously of known malignant lymph node showed persistent malignancy present within the lymph node, as previously discussed with the patient, we then proceeded with a full axillary dissection to minimize the chance of recurrence  Description of procedure: Preoperative RF localization was performed by radiology. In the nuclear medicine suite, the subareolar region was injected with Tc-99 sulfur colloid.  The patient was taken to the operating room and placed supine on the operating table, and after general anesthesia the right breast and axilla were prepped and draped in the usual sterile fashion. A time-out was completed verifying correct patient, procedure, site, positioning, and implant(s)  and/or special equipment prior to beginning this procedure.   A hand-held gamma probe was used to identify the location of the hottest spot in the axilla.  This area coincided with the RF localizer tagged previously biopsy lymph node.  An incision was made around the caudal axillary hairline. Sharp and blunt Dissection was carried down to subdermal facias. The probe was placed within wound and again, the point of maximal count was found. Dissection continue until nodule was identified. The probe was placed in contact with the node and 1000 counts were recorded.  RF probe localizer also showed that this is the same lymph node that has the RF localizer within it.  The node was excised in its entirety. Ex vivo, the node measured 1000 counts when placed on the probe. The bed of the node measured 20 counts. An additional hot spot was detected and another node was excised in similar fashion. No additional hot spots were identified. No clinically abnormal nodes were palpated.   The probable trajectory and location of the mass was visualized. A skin incision was planned in such a way as to minimize the amount of dissection to reach the mass.  The skin incision was made after infusion of local. Flaps were raised and the location of the RF localizer confirmed. Sharp and blunt dissection was then taken down to the mass, taking care to include the  a margin of grossly normal tissue. The specimen and localizer were removed. The specimen was oriented with paint and Intra-Op imaging confirmed previous biopsy clip within specimen.  Pathology called back stating there was extensive scarring at the anterior margin which may skew detection of possible malignancy, so the anterior margin  was taken just in case.  This new margin was painted with the appropriate orientation and sent off to pathology.  During the partial mastectomy portion of the procedure, call was received from pathology stating that the previously biopsied lymph  node have persistent malignancy in, therefore decision was made to then proceed with a full right axillary dissection after completion of the partial mastectomy.  After extending the incision made for the sentinel lymph node biopsy, extensive dissection was then carried out throughout the entire axilla.  Superiorly the axillary vein and arteries were identified, medially to the chest walls was identified along with what look like the long thoracic nerve, posteriorly the latissimus dorsi muscle was noted, and then laterally dissection was carried out to the subcutaneous layer.  All visible axillary fat within the axillary space was then meticulously dissected off the surrounding structures taking care to avoid injuries to any visible vessels and nerves.  Small amount of bleeding was controlled with electrocautery and a combination of 3-0 suture ligation as well as ties.  The excised axillary tissue along with the lymph nodes within it was then sent off operative field pending pathology.  Final examination of the former axillary cavity did not note any signs of active bleeding.  Both wounds irrigated, hemostasis was confirmed and the wound closed in layers with  interrupted sutures of 3-0 Vicryl in deep dermal layer and a running subcuticular suture of Monocryl 4-0, then dressed with dermabond. The patient tolerated the procedure well and was taken to the postanesthesia care unit in stable condition. Sponge and instrument count correct at end of procedure.

## 2020-02-03 NOTE — Anesthesia Postprocedure Evaluation (Signed)
Anesthesia Post Note  Patient: Jessica [name]  Procedure(s) Performed: PARTIAL MASTECTOMY WITH RADIO FREQUENCY LOCALIZER AND AXILLARY SENTINEL LYMPH NODE BIOPSY (Right Breast) AXILLARY LYMPH NODE DISSECTION (Right )  Patient location during evaluation: PACU Anesthesia Type: General Level of consciousness: awake and alert Pain management: pain level controlled Vital Signs Assessment: post-procedure vital signs reviewed and stable Respiratory status: spontaneous breathing, nonlabored ventilation, respiratory function stable and patient connected to nasal cannula oxygen Cardiovascular status: blood pressure returned to baseline and stable Postop Assessment: no apparent nausea or vomiting Anesthetic complications: no     Last Vitals:  Vitals:   02/01/20 1702 02/01/20 1717  BP: 111/70 115/70  Pulse: (!) 105 (!) 103  Resp: 20 20  Temp: (!) 36.1 C 36.4 C  SpO2: 95% 96%    Last Pain:  Vitals:   02/02/20 0842  TempSrc:   PainSc: 6                  Martha Clan

## 2020-02-05 LAB — SURGICAL PATHOLOGY

## 2020-02-12 ENCOUNTER — Telehealth: Payer: Self-pay

## 2020-02-12 NOTE — Telephone Encounter (Signed)
-----   Message from Earlie Server, MD sent at 02/10/2020  9:14 PM EDT ----- Regarding: FW: path report Please schedule her to follow up with me this week to discuss pathology results. MD only. Thanks. ----- Message ----- From: Benjamine Sprague, DO Sent: 02/06/2020   3:08 PM EDT To: Rico Junker, RN, Earlie Server, MD Subject: path report                                    Forwarding you all path report for [name].  Had more positive LN with extracapsular involvement. ----- Message ----- From: Interface, [facility] Sent: 02/05/2020   3:41 PM EDT To: Benjamine Sprague, DO

## 2020-02-13 ENCOUNTER — Encounter: Payer: Self-pay | Admitting: Oncology

## 2020-02-13 ENCOUNTER — Inpatient Hospital Stay: Payer: BC Managed Care – PPO | Attending: Oncology | Admitting: Oncology

## 2020-02-13 ENCOUNTER — Other Ambulatory Visit: Payer: Self-pay

## 2020-02-13 VITALS — BP 126/86 | HR 82 | Temp 95.8°F | Resp 16 | Wt 219.1 lb

## 2020-02-13 DIAGNOSIS — Z9221 Personal history of antineoplastic chemotherapy: Secondary | ICD-10-CM | POA: Insufficient documentation

## 2020-02-13 DIAGNOSIS — Z170 Estrogen receptor positive status [ER+]: Secondary | ICD-10-CM | POA: Insufficient documentation

## 2020-02-13 DIAGNOSIS — M1990 Unspecified osteoarthritis, unspecified site: Secondary | ICD-10-CM | POA: Insufficient documentation

## 2020-02-13 DIAGNOSIS — Z79899 Other long term (current) drug therapy: Secondary | ICD-10-CM | POA: Diagnosis not present

## 2020-02-13 DIAGNOSIS — Z95828 Presence of other vascular implants and grafts: Secondary | ICD-10-CM

## 2020-02-13 DIAGNOSIS — R5383 Other fatigue: Secondary | ICD-10-CM | POA: Diagnosis not present

## 2020-02-13 DIAGNOSIS — T451X5A Adverse effect of antineoplastic and immunosuppressive drugs, initial encounter: Secondary | ICD-10-CM

## 2020-02-13 DIAGNOSIS — G620 Drug-induced polyneuropathy: Secondary | ICD-10-CM | POA: Diagnosis not present

## 2020-02-13 DIAGNOSIS — K219 Gastro-esophageal reflux disease without esophagitis: Secondary | ICD-10-CM | POA: Diagnosis not present

## 2020-02-13 DIAGNOSIS — C50311 Malignant neoplasm of lower-inner quadrant of right female breast: Secondary | ICD-10-CM | POA: Insufficient documentation

## 2020-02-13 NOTE — Progress Notes (Signed)
[place]   [phone] [phone]   Clinic Follow up Note   Patient Care Team: Sharyne Peach, MD as PCP - General (Family Medicine) Rico Junker, RN as Registered Nurse Noreene Filbert, MD as Radiation Oncologist (Radiation Oncology)  Date of Service:  02/13/2020  CHIEF COMPLAINT: F/u of right breast cancer   SUMMARY OF ONCOLOGIC HISTORY: Oncology History Overview Note  Cancer Staging Malignant neoplasm of lower-inner quadrant of right breast of female, estrogen receptor positive ([place]) Staging form: Breast, AJCC 8th Edition - Clinical stage from 07/26/2019: Stage IIB (cT2, cN1, cM0, G3, ER+, PR+, HER2-) - Unsigned    Malignant neoplasm of lower-inner quadrant of right breast of female, estrogen receptor positive ([place])  07/20/2019 Mammogram   Mammogram 07/20/19  IMPRESSION: Suspicious palpable right breast mass 3.4 x 2.5 x 3.4 cm 5 o'clock position 3 cm from nipple.   Suspicious 1.6cm cortically thickened right axillary lymph node.   07/26/2019 Initial Diagnosis   DIAGNOSIS: 07/26/19 A. RIGHT BREAST, 5:00, 3CMFN; ULTRASOUND-GUIDED NEEDLE CORE BIOPSY:  - INVASIVE MAMMARY CARCINOMA, NO SPECIAL TYPE.    07/26/2019 Receptors her2   BREAST BIOMARKER TESTS  Estrogen Receptor (ER) Status: POSITIVE                       Percentage of cells with nuclear positivity: 51-90%                       Average intensity of staining: Strong   Progesterone Receptor (PgR) Status: POSITIVE                       Percentage of cells with nuclear positivity:  Greater than 90%                       Average intensity of staining: Strong   HER2 (by immunohistochemistry): NEGATIVE (Score 1+)    08/05/2019 Initial Diagnosis   Invasive carcinoma of breast ([place])   08/17/2019 Surgery   INSERTION PORT-A-CATH by Dr. Lysle Pearl 08/17/19    08/18/2019 -  Chemotherapy   AC q2weeks for 4 cycles starting 08/18/19 followed by weekly Taxol for 12 weeks     Genetic  Testing   Negative genetic testing. No pathogenic variants identified on the [facility] Common Hereditary Cancers Panel. The report date is 11/13/2019.   The Common Hereditary Cancers Panel offered by [facility] includes sequencing and/or deletion duplication testing of the following 48 genes: APC, ATM, AXIN2, BARD1, BMPR1A, BRCA1, BRCA2, BRIP1, CDH1, CDKN2A (p14ARF), CDKN2A (p16INK4a), CKD4, CHEK2, CTNNA1, DICER1, EPCAM (Deletion/duplication testing only), GREM1 (promoter region deletion/duplication testing only), KIT, MEN1, MLH1, MSH2, MSH3, MSH6, MUTYH, NBN, NF1, NHTL1, PALB2, PDGFRA, PMS2, POLD1, POLE, PTEN, RAD50, RAD51C, RAD51D, RNF43, SDHB, SDHC, SDHD, SMAD4, SMARCA4. STK11, TP53, TSC1, TSC2, and VHL.  The following genes were evaluated for sequence changes only: SDHA and HOXB13 c.251G>A variant only.      CANCER THERAPY:  Neoadjuvant chemo ddAC starting 08/18/19 for 4 cycles followed by weekly Taxol x 12 Finished chemotherapy on 01/01/2020.    INTERVAL HISTORY:  Jessica [name] presents to follow-up for breast cancer treatment. S/p 02/01/2020 s/p right lumpectomy and axillary lymph node dissection.  She reports feeling some soreness around surgical sites.  No new complaints.    Review of Systems  Constitutional: Positive for fatigue. Negative for appetite change, chills and fever.  HENT:   Negative for hearing loss and voice  change.   Eyes: Negative for eye problems.  Respiratory: Negative for chest tightness and cough.   Cardiovascular: Negative for chest pain.  Gastrointestinal: Negative for abdominal distention, abdominal pain and blood in stool.  Endocrine: Negative for hot flashes.  Genitourinary: Negative for difficulty urinating and frequency.   Musculoskeletal: Negative for arthralgias.  Skin: Negative for itching and rash.  Neurological: Positive for numbness. Negative for extremity weakness.  Hematological: Negative for adenopathy.  Psychiatric/Behavioral: Negative for  confusion and sleep disturbance.    MEDICAL HISTORY:  Past Medical History:  Diagnosis Date  . Anemia   . Arthritis    left ankle  . Breast cancer ([place]) 07/23/2019   right breast- [facility]  . Cancer [facility])    breast cancer  . Family history of cervical cancer   . Family history of lung cancer   . Frequency   . GERD (gastroesophageal reflux disease)   . History of kidney stones    h/o  . Obesity   . Personal history of chemotherapy   . Right flank pain     SURGICAL HISTORY: Past Surgical History:  Procedure Laterality Date  . AXILLARY LYMPH NODE DISSECTION Right 02/01/2020   Procedure: AXILLARY LYMPH NODE DISSECTION;  Surgeon: Benjamine Sprague, DO;  Location: [facility] ORS;  Service: General;  Laterality: Right;  . BREAST BIOPSY Right 2020   [facility]  . CESAREAN SECTION     x 2  . CHOLECYSTECTOMY    . DILATION AND CURETTAGE OF UTERUS    . LITHOTRIPSY    . PORTACATH PLACEMENT Left 08/17/2019   Procedure: INSERTION PORT-A-CATH;  Surgeon: Benjamine Sprague, DO;  Location: [facility] ORS;  Service: General;  Laterality: Left;  . TUBAL LIGATION      I have reviewed the social history and family history with the patient and they are unchanged from previous note.  ALLERGIES:  has No Known Allergies.  MEDICATIONS:  Current Outpatient Medications  Medication Sig Dispense Refill  . acetaminophen (TYLENOL) 325 MG tablet Take 2 tablets (650 mg total) by mouth every 8 (eight) hours as needed for mild pain. 40 tablet 0  . ibuprofen (ADVIL) 800 MG tablet Take 1 tablet (800 mg total) by mouth every 8 (eight) hours as needed for mild pain or moderate pain. 30 tablet 0  . omeprazole (PRILOSEC) 20 MG capsule Take 1 capsule (20 mg total) by mouth daily as needed (acid reflux/indigestion.).    Marland Kitchen ferrous sulfate 325 (65 FE) MG EC tablet TAKE 1 TABLET (325 MG TOTAL) BY MOUTH 2 (TWO) TIMES DAILY WITH A MEAL. (Patient not taking: Reported on 02/13/2020) 60 tablet 1  . gabapentin (NEURONTIN) 600 MG tablet Take 600 mg by  mouth 3 (three) times daily.    Marland Kitchen HYDROcodone-acetaminophen (NORCO) 5-325 MG tablet Take 1 tablet by mouth every 6 (six) hours as needed for up to 15 doses for moderate pain. (Patient not taking: Reported on 02/13/2020) 6 tablet 0   No current facility-administered medications for this visit.   Facility-Administered Medications Ordered in Other Visits  Medication Dose Route Frequency Provider Last Rate Last Admin  . sodium chloride flush (NS) 0.9 % injection 10 mL  10 mL Intravenous Once Earlie Server, MD        PHYSICAL EXAMINATION: ECOG PERFORMANCE STATUS: 1 - Symptomatic but completely ambulatory  Vitals:   02/13/20 1314  BP: 126/86  Pulse: 82  Resp: 16  Temp: (!) 95.8 F (35.4 C)   Filed Weights   02/13/20 1314  Weight: 219 lb  1.6 oz (99.4 kg)   Physical Exam  Constitutional: She is oriented to person, place, and time. No distress.  HENT:  Head: Normocephalic and atraumatic.  Nose: Nose normal.  Mouth/Throat: Oropharynx is clear and moist. No oropharyngeal exudate.  Eyes: Pupils are equal, round, and reactive to light. EOM are normal. No scleral icterus.  Cardiovascular: Normal rate and regular rhythm.  No murmur heard. Pulmonary/Chest: Effort normal. No respiratory distress. She has no rales. She exhibits no tenderness.  Abdominal: Soft. She exhibits no distension. There is no abdominal tenderness.  Musculoskeletal:        General: No edema. Normal range of motion.     Cervical back: Normal range of motion and neck supple.  Neurological: She is alert and oriented to person, place, and time. No cranial nerve deficit. She exhibits normal muscle tone. Coordination normal.  Skin: Skin is warm and dry. She is not diaphoretic. No erythema.  Psychiatric: Affect normal.   Breast exam:  S/p right lumpectomy and right axillary dissection. no discharge at surgical site.   LABORATORY DATA:  I have reviewed the data as listed CBC Latest Ref Rng & Units 01/01/2020 12/25/2019 12/18/2019    WBC 4.0 - 10.5 K/uL 5.8 5.6 6.0  Hemoglobin 12.0 - 15.0 g/dL 10.7(L) 10.5(L) 10.8(L)  Hematocrit 36.0 - 46.0 % 34.5(L) 32.6(L) 34.6(L)  Platelets 150 - 400 K/uL 349 343 372     CMP Latest Ref Rng & Units 01/01/2020 12/25/2019 12/18/2019  Glucose 70 - 99 mg/dL 146(H) 149(H) 152(H)  BUN 6 - 20 mg/dL '9 11 11  ' Creatinine 0.44 - 1.00 mg/dL 0.74 0.64 0.83  Sodium 135 - 145 mmol/L 140 138 138  Potassium 3.5 - 5.1 mmol/L 3.6 3.8 3.8  Chloride 98 - 111 mmol/L 106 104 103  CO2 22 - 32 mmol/L '24 25 26  ' Calcium 8.9 - 10.3 mg/dL 8.8(L) 8.8(L) 8.9  Total Protein 6.5 - 8.1 g/dL 6.6 6.7 6.8  Total Bilirubin 0.3 - 1.2 mg/dL 0.5 0.4 0.6  Alkaline Phos 38 - 126 U/L 68 84 80  AST 15 - 41 U/L '28 23 23  ' ALT 0 - 44 U/L '31 28 28      ' RADIOGRAPHIC STUDIES: I have personally reviewed the radiological images as listed and agreed with the findings in the report. NM SENTINEL NODE INJECTION  Result Date: 02/01/2020 CLINICAL DATA:  Right breast cancer. EXAM: NUCLEAR MEDICINE BREAST LYMPHOSCINTIGRAPHY RIGHT BREAST TECHNIQUE: Intradermal injection of radiopharmaceutical was performed at the 12 o'clock, 3 o'clock, 6 o'clock, and 9 o'clock positions around the right nipple. The patient was then sent to the operating room where the sentinel node(s) were identified and removed by the surgeon. RADIOPHARMACEUTICALS:  Total of approximately 1.0 mCi Millipore-filtered Technetium-6msulfur colloid, injected in four aliquots of 0.25 mCi each. IMPRESSION: Uncomplicated intradermal injection of a total of approximately 1.0 mCi Technetium-916mulfur colloid for purposes of sentinel node identification. Electronically Signed   By: ThMarcello MooresRegister   On: 02/01/2020 08:29   MM Breast Surgical Specimen  Result Date: 02/01/2020 CLINICAL DATA:  Patient status post right breast lumpectomy. The area was localized with an RF ID tag. EXAM: SPECIMEN RADIOGRAPH OF THE RIGHT BREAST COMPARISON:  Previous exam(s). FINDINGS: Status post excision  of the right breast. The biopsy marking clip is present, completely intact, and marked for pathology. The RF ID tag is not present within the specimen. IMPRESSION: Specimen radiograph of the right breast. Note the RF ID tag is not present within the specimen.  The tag was removed at the time of surgery however during the dissection fell out of the specimen according to the surgeon. This was discussed with Dr. Lysle Pearl on 02/01/2020 while patient was in the operating room. Electronically Signed   By: Lovey Newcomer M.D.   On: 02/01/2020 10:40   US Breast Limited Uni Right Inc Axilla  Result Date: 01/10/2020 CLINICAL DATA:  44 year old female with biopsy-proven invasive RIGHT breast cancer and RIGHT axillary lymph node metastasis. Evaluate response to neoadjuvant therapy. EXAM: DIGITAL DIAGNOSTIC RIGHT MAMMOGRAM WITH CAD AND TOMO ULTRASOUND RIGHT BREAST COMPARISON:  10/02/2019 and prior studies ACR Breast Density Category b: There are scattered areas of fibroglandular density. FINDINGS: 2D/3D full field views of the RIGHT breast demonstrate decreased size of the known malignancy within the LOWER INNER RIGHT breast since 10/02/2019, and again is noted to contain a HEART shaped biopsy clip. No new RIGHT breast abnormalities are noted. Mammographic images were processed with CAD. Targeted ultrasound is performed, showing a 1.9 x 1.1 x 1.6 cm hypoechoic mass at the 5 o'clock position of the RIGHT breast 3 cm from the nipple, previously 2.8 x 1.6 x 2.8 cm. A 1.1 x 0.6 cm solitary enlarged RIGHT axillary lymph node with 0.6 cm cortical thickening is noted, previously measuring 3.3 x 1.5 cm with 1.5 cm cortical thickening. IMPRESSION: 1. Decreased size of known LOWER INNER RIGHT breast malignancy and decreased size of RIGHT axillary lymph node metastasis since 10/02/2019, compatible with treatment response. RECOMMENDATION: Treatment plan I have discussed the findings and recommendations with the patient. If applicable, a  reminder letter will be sent to the patient regarding the next appointment. BI-RADS CATEGORY  6: Known biopsy-proven malignancy. Electronically Signed   By: Margarette Canada M.D.   On: 01/10/2020 10:35   MM DIAG BREAST TOMO UNI RIGHT  Result Date: 01/30/2020 CLINICAL DATA:  Post radiofrequency tag localization of a mass in the right breast and a right axillary lymph node. EXAM: DIAGNOSTIC RIGHT MAMMOGRAM POST ULTRASOUND BIOPSY COMPARISON:  Previous exam(s). FINDINGS: Mammographic images were obtained following ultrasound guided biopsy of a mass in the right breast at 5 o'clock and a right axillary lymph node. The localizing Tag is in expected position adjacent to the mass with biopsy marking clip in the lower-inner quadrant. 2D image over the right axilla demonstrates that the localizing Tag is immediately adjacent to the [facility] biopsy marking clip. IMPRESSION: Appropriate positioning of the RF Tags are appropriately positioned at the mass in the lower-inner right breast and at the right axillary lymph node. Final Assessment: Post Procedure Mammograms for Marker Placement Electronically Signed   By: Ammie Ferrier M.D.   On: 01/30/2020 16:39   MM DIAG BREAST TOMO UNI RIGHT  Result Date: 01/10/2020 CLINICAL DATA:  44 year old female with biopsy-proven invasive RIGHT breast cancer and RIGHT axillary lymph node metastasis. Evaluate response to neoadjuvant therapy. EXAM: DIGITAL DIAGNOSTIC RIGHT MAMMOGRAM WITH CAD AND TOMO ULTRASOUND RIGHT BREAST COMPARISON:  10/02/2019 and prior studies ACR Breast Density Category b: There are scattered areas of fibroglandular density. FINDINGS: 2D/3D full field views of the RIGHT breast demonstrate decreased size of the known malignancy within the LOWER INNER RIGHT breast since 10/02/2019, and again is noted to contain a HEART shaped biopsy clip. No new RIGHT breast abnormalities are noted. Mammographic images were processed with CAD. Targeted ultrasound is performed, showing  a 1.9 x 1.1 x 1.6 cm hypoechoic mass at the 5 o'clock position of the RIGHT breast 3 cm from the nipple, previously  2.8 x 1.6 x 2.8 cm. A 1.1 x 0.6 cm solitary enlarged RIGHT axillary lymph node with 0.6 cm cortical thickening is noted, previously measuring 3.3 x 1.5 cm with 1.5 cm cortical thickening. IMPRESSION: 1. Decreased size of known LOWER INNER RIGHT breast malignancy and decreased size of RIGHT axillary lymph node metastasis since 10/02/2019, compatible with treatment response. RECOMMENDATION: Treatment plan I have discussed the findings and recommendations with the patient. If applicable, a reminder letter will be sent to the patient regarding the next appointment. BI-RADS CATEGORY  6: Known biopsy-proven malignancy. Electronically Signed   By: Margarette Canada M.D.   On: 01/10/2020 10:35   [place] RT RADIO FREQUENCY TAG LOC US GUIDE  Result Date: 01/31/2020 CLINICAL DATA:  44 year old female presenting for tag localization of a mass in the right breast and a right axillary lymph node. EXAM: NEEDLE LOCALIZATION OF THE RIGHT BREAST WITH ULTRASOUND GUIDANCE COMPARISON:  Previous exams. FINDINGS: Patient presents for needle localization prior to right breast lumpectomy and targeted lymph node dissection. I met with the patient and we discussed the procedure of needle localization including benefits and alternatives. We discussed the high likelihood of a successful procedure. We discussed the risks of the procedure, including infection, bleeding, tissue injury, and further surgery. Informed, written consent was given. The usual time-out protocol was performed immediately prior to the procedure. Using ultrasound guidance, sterile technique, 1% lidocaine and a 7 cm Hologic Tag applicator (#[id number]), the mass with the biopsy marking clip in the right breast at 5 was localized using inferior approach. Using ultrasound guidance, sterile technique, 1% lidocaine and a 7 cm Hologic Tag applicator (#[id number]), the lymph node with  the biopsy marking clip in the right axilla was localized using inferior approach. The images were marked for Dr. Lysle Pearl. IMPRESSION: Radiofrequency tag localization of the right breast mass at 5 o'clock and the right axillary lymph node. No apparent complications. Electronically Signed   By: Ammie Ferrier M.D.   On: 01/31/2020 08:55   [place] RT RADIO FREQUENCY TAG EA ADD LESION LOC US GUIDE  Result Date: 01/31/2020 CLINICAL DATA:  44 year old female presenting for tag localization of a mass in the right breast and a right axillary lymph node. EXAM: NEEDLE LOCALIZATION OF THE RIGHT BREAST WITH ULTRASOUND GUIDANCE COMPARISON:  Previous exams. FINDINGS: Patient presents for needle localization prior to right breast lumpectomy and targeted lymph node dissection. I met with the patient and we discussed the procedure of needle localization including benefits and alternatives. We discussed the high likelihood of a successful procedure. We discussed the risks of the procedure, including infection, bleeding, tissue injury, and further surgery. Informed, written consent was given. The usual time-out protocol was performed immediately prior to the procedure. Using ultrasound guidance, sterile technique, 1% lidocaine and a 7 cm Hologic Tag applicator (#[id number]), the mass with the biopsy marking clip in the right breast at 5 was localized using inferior approach. Using ultrasound guidance, sterile technique, 1% lidocaine and a 7 cm Hologic Tag applicator (#[id number]), the lymph node with the biopsy marking clip in the right axilla was localized using inferior approach. The images were marked for Dr. Lysle Pearl. IMPRESSION: Radiofrequency tag localization of the right breast mass at 5 o'clock and the right axillary lymph node. No apparent complications. Electronically Signed   By: Ammie Ferrier M.D.   On: 01/31/2020 08:55     ASSESSMENT & PLAN:  1. Malignant neoplasm of lower-inner quadrant of right breast of female, estrogen  receptor positive ([place])  2. Port-A-Cath in place   3. Neuropathy due to chemotherapeutic drug ([place])     Invasive right breast cancer,  Cancer Staging Malignant neoplasm of lower-inner quadrant of right breast of female, estrogen receptor positive ([place]) Staging form: Breast, AJCC 8th Edition - Clinical stage from 07/26/2019: Stage IIB (cT2, cN1, cM0, G3, ER+, PR+, HER2-) - Unsigned -Status post 4 cycles of dose dense Adriamycin and Cytoxan, s/p weekly Taxol x 12.  Pathology ypT1c ypN1a(sn)  ER/PR positive, HER2 negative, residual invasive mammary carcinoma, grade 3.  1/3 sentinel lymph node positive and 2/10 right axillary lymph nodes positive.  DCIS present. Margin was negative for invasive carcinoma and DCIS Pathology was reviewed and discussed with patient.   # recommend adjuvant radiation. Refer to Radiation Oncology # Discussed with patient about adjuvant endocrine therapy.  Her LMP was 06/2019. premenopausal.  I discussed about future plan of ovarian suppression + aromatase inhibitor Discussed about adjuvant bisphosphanate treatments. Recommend obtaining dental clearance.  Will obtain baseline DEXA after she finishes radiation. 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  Symptom has improved.  #Anemia secondary to chemotherapy, hemoglobin stable.  Continue to monitor. #Port-A-Cath in place, patient will need port flush every 6 to 8 weeks.    Follow-up  8 weeks.    All questions were answered. The patient knows to call the clinic with any problems, questions or concerns. No barriers to learning was detected.     Earlie Server, MD 02/13/2020

## 2020-02-13 NOTE — Progress Notes (Signed)
Neuropathy has improved.  Here to discuss path results.

## 2020-02-23 ENCOUNTER — Encounter: Payer: Self-pay | Admitting: Radiation Oncology

## 2020-02-23 ENCOUNTER — Encounter: Payer: Self-pay | Admitting: Oncology

## 2020-02-23 ENCOUNTER — Ambulatory Visit
Admission: RE | Admit: 2020-02-23 | Discharge: 2020-02-23 | Disposition: A | Discharge status: Home / Self Care | Payer: BC Managed Care – PPO | Source: Ambulatory Visit | Attending: Radiation Oncology | Admitting: Radiation Oncology

## 2020-02-23 ENCOUNTER — Other Ambulatory Visit: Payer: Self-pay

## 2020-02-23 VITALS — BP 110/76 | HR 84 | Temp 97.6°F | Resp 16 | Wt 215.0 lb

## 2020-02-23 DIAGNOSIS — Z87442 Personal history of urinary calculi: Secondary | ICD-10-CM | POA: Insufficient documentation

## 2020-02-23 DIAGNOSIS — M1990 Unspecified osteoarthritis, unspecified site: Secondary | ICD-10-CM | POA: Diagnosis not present

## 2020-02-23 DIAGNOSIS — C773 Secondary and unspecified malignant neoplasm of axilla and upper limb lymph nodes: Secondary | ICD-10-CM | POA: Diagnosis not present

## 2020-02-23 DIAGNOSIS — Z170 Estrogen receptor positive status [ER+]: Secondary | ICD-10-CM | POA: Insufficient documentation

## 2020-02-23 DIAGNOSIS — N200 Calculus of kidney: Secondary | ICD-10-CM | POA: Insufficient documentation

## 2020-02-23 DIAGNOSIS — K219 Gastro-esophageal reflux disease without esophagitis: Secondary | ICD-10-CM | POA: Insufficient documentation

## 2020-02-23 DIAGNOSIS — C50311 Malignant neoplasm of lower-inner quadrant of right female breast: Secondary | ICD-10-CM

## 2020-02-23 DIAGNOSIS — E669 Obesity, unspecified: Secondary | ICD-10-CM | POA: Insufficient documentation

## 2020-02-23 DIAGNOSIS — Z801 Family history of malignant neoplasm of trachea, bronchus and lung: Secondary | ICD-10-CM | POA: Insufficient documentation

## 2020-02-23 DIAGNOSIS — Z9221 Personal history of antineoplastic chemotherapy: Secondary | ICD-10-CM | POA: Insufficient documentation

## 2020-02-23 DIAGNOSIS — M129 Arthropathy, unspecified: Secondary | ICD-10-CM | POA: Insufficient documentation

## 2020-02-23 DIAGNOSIS — Z79899 Other long term (current) drug therapy: Secondary | ICD-10-CM | POA: Diagnosis not present

## 2020-02-23 NOTE — Consult Note (Signed)
NEW PATIENT EVALUATION  Name: Jessica [name]  MRN: [id number]  Date:   02/23/2020     DOB: July 17, 1976   This 44 y.o. 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  REFERRING PHYSICIAN: Sharyne Peach, MD  CHIEF COMPLAINT:  Chief Complaint  Patient presents with  . Breast Cancer    DIAGNOSIS: The encounter diagnosis was Malignant neoplasm of lower-inner quadrant of right breast of female, estrogen receptor positive ([place]).   PREVIOUS INVESTIGATIONS:  Mammogram and ultrasound reviewed Pathology report reviewed Clinical notes reviewed  HPI: Patient is a 44 year old female who presented to self discovered mass in the right breast.  Mammogram and ultrasound confirmed a 3.4 x 2.5 x 3.4 cm hypoechoic irregular mass in the right breast at the 5 o'clock position 5 cm from the nipple.  There is also a single cortical thickened abnormal appearing right axillary lymph node with a thickness of 1.6 cm.  Ultrasound-guided biopsy was positive for invasive mammary carcinoma of no special type and axillary lymph node core biopsy was also positive for metastatic disease.  She went on to have dosed dense AC followed by Taxol and then a wide local excision and axillary lymph node dissection.  Wide local excision reviewed field 1.2 cm in greatest dimension residual invasive mammary carcinoma overall grade 3.  Tumor was ER/PR positive HER-2/neu not overexpressed.  Margins were clear at 3 mm.  10 lymph nodes were examined 3 of which had lymph node macro met metastatic disease greater than 2 mm.  She has done well postoperatively.  She specifically denies breast tenderness cough or bone pain.  She is having no swelling her right upper extremity at this  time.  She is now referred to radiation oncology for opinion.  PLANNED TREATMENT REGIMEN: Right whole breast and peripheral lymphatic radiation  PAST MEDICAL HISTORY:  has a past medical history of Anemia, Arthritis, Breast cancer ([place]) (07/23/2019), Cancer ([place]), Family history of cervical cancer, Family history of lung cancer, Frequency, GERD (gastroesophageal reflux disease), History of kidney stones, Obesity, Personal history of chemotherapy, and Right flank pain.    PAST SURGICAL HISTORY:  Past Surgical History:  Procedure Laterality Date  . AXILLARY LYMPH NODE DISSECTION Right 02/01/2020   Procedure: AXILLARY LYMPH NODE DISSECTION;  Surgeon: Benjamine Sprague, DO;  Location: [facility] ORS;  Service: General;  Laterality: Right;  . BREAST BIOPSY Right 2020   [facility]  . CESAREAN SECTION     x 2  . CHOLECYSTECTOMY    . DILATION AND CURETTAGE OF UTERUS    . LITHOTRIPSY    . PORTACATH PLACEMENT Left 08/17/2019   Procedure: INSERTION PORT-A-CATH;  Surgeon: Benjamine Sprague, DO;  Location: [facility] ORS;  Service: General;  Laterality: Left;  . TUBAL LIGATION      FAMILY HISTORY: family history includes Cervical cancer (age of onset: 3) in her mother; Kidney Stones in her mother and another family member; Lung cancer in her maternal grandmother; Other in her sister.  SOCIAL HISTORY:  reports that she has never smoked. She has never used smokeless tobacco. She reports that she does not drink alcohol or use drugs.  ALLERGIES: Patient has no known allergies.  MEDICATIONS:  Current Outpatient Medications  Medication Sig Dispense Refill  . acetaminophen (TYLENOL) 325 MG tablet  Take 2 tablets (650 mg total) by mouth every 8 (eight) hours as needed for mild pain. 40 tablet 0  . ferrous sulfate 325 (65 FE) MG EC tablet TAKE 1 TABLET (325 MG TOTAL) BY MOUTH 2 (TWO) TIMES DAILY WITH A MEAL. 60 tablet 1  . gabapentin (NEURONTIN) 600 MG tablet Take 600 mg by mouth 3 (three) times daily.    Marland Kitchen HYDROcodone-acetaminophen  (NORCO) 5-325 MG tablet Take 1 tablet by mouth every 6 (six) hours as needed for up to 15 doses for moderate pain. 6 tablet 0  . ibuprofen (ADVIL) 800 MG tablet Take 1 tablet (800 mg total) by mouth every 8 (eight) hours as needed for mild pain or moderate pain. 30 tablet 0  . omeprazole (PRILOSEC) 20 MG capsule Take 1 capsule (20 mg total) by mouth daily as needed (acid reflux/indigestion.).     No current facility-administered medications for this encounter.   Facility-Administered Medications Ordered in Other Encounters  Medication Dose Route Frequency Provider Last Rate Last Admin  . sodium chloride flush (NS) 0.9 % injection 10 mL  10 mL Intravenous Once Earlie Server, MD        ECOG PERFORMANCE STATUS:  0 - Asymptomatic  REVIEW OF SYSTEMS: Patient denies any weight loss, fatigue, weakness, fever, chills or night sweats. Patient denies any loss of vision, blurred vision. Patient denies any ringing  of the ears or hearing loss. No irregular heartbeat. Patient denies heart murmur or history of fainting. Patient denies any chest pain or pain radiating to her upper extremities. Patient denies any shortness of breath, difficulty breathing at night, cough or hemoptysis. Patient denies any swelling in the lower legs. Patient denies any nausea vomiting, vomiting of blood, or coffee ground material in the vomitus. Patient denies any stomach pain. Patient states has had normal bowel movements no significant constipation or diarrhea. Patient denies any dysuria, hematuria or significant nocturia. Patient denies any problems walking, swelling in the joints or loss of balance. Patient denies any skin changes, loss of hair or loss of weight. Patient denies any excessive worrying or anxiety or significant depression. Patient denies any problems with insomnia. Patient denies excessive thirst, polyuria, polydipsia. Patient denies any swollen glands, patient denies easy bruising or easy bleeding. Patient denies any  recent infections, allergies or URI. Patient "s visual fields have not changed significantly in recent time.   PHYSICAL EXAM: BP 110/76 (BP Location: Left Arm, Patient Position: Sitting, Cuff Size: Normal)   Pulse 84   Temp 97.6 F (36.4 C)   Resp 16   Wt 215 lb (97.5 kg)   BMI 39.32 kg/m  Right breast is wide local excision scar which is healed well there is some slight oozing still present which is minimal.  Axillary dissection scar is also well-healed.  No dominant mass or nodularity is noted in either breast in 2 positions examined.  No axillary or supraclavicular adenopathy is appreciated.  Well-developed well-nourished patient in NAD. HEENT reveals PERLA, EOMI, discs not visualized.  Oral cavity is clear. No oral mucosal lesions are identified. Neck is clear without evidence of cervical or supraclavicular adenopathy. Lungs are clear to A&P. Cardiac examination is essentially unremarkable with regular rate and rhythm without murmur rub or thrill. Abdomen is benign with no organomegaly or masses noted. Motor sensory and DTR levels are equal and symmetric in the upper and lower extremities. Cranial nerves II through XII are grossly intact. Proprioception is intact. No peripheral adenopathy or edema is identified. No motor or  sensory levels are noted. Crude visual fields are within normal range.  LABORATORY DATA: Pathology report reviewed    RADIOLOGY RESULTS: Mammogram and ultrasound reviewed   IMPRESSION: Stage II invasive mammary carcinoma ER/PR positive HER-2 negative status post neoadjuvant chemotherapy followed by wide local excision and axillary lymph node dissection in 44 year old female  PLAN: At this time I would recommend whole breast and peripheral lymphatic radiation I would treat both sites to 5040 cGy in 28 fractions.  Would also boost her scar another 1400 cGy using electron beam.  Risks and benefits of treatment including skin reaction fatigue alteration of blood counts  possible inclusion of superficial lung and slight chance of lymphedema of her right upper extremity all were discussed with the patient I have emphasized she needs to exercise the arm as much as possible.  I have personally set up and ordered CT simulation for next week.  Patient comprehends my treatment plan well.  She will be a candidate for antiestrogen therapy after completion of radiation.  I would like to take this opportunity to thank you for allowing me to participate in the care of your patient.Noreene Filbert, MD

## 2020-02-26 ENCOUNTER — Telehealth: Payer: Self-pay

## 2020-02-26 NOTE — Telephone Encounter (Signed)
Dental clearance request letter faxed to [facility].   Ph: [phone] Fx: [phone]

## 2020-02-27 ENCOUNTER — Other Ambulatory Visit: Payer: Self-pay

## 2020-02-27 ENCOUNTER — Ambulatory Visit: Payer: BC Managed Care – PPO

## 2020-02-28 ENCOUNTER — Ambulatory Visit
Admission: RE | Admit: 2020-02-28 | Discharge: 2020-02-28 | Disposition: A | Discharge status: Home / Self Care | Payer: BC Managed Care – PPO | Source: Ambulatory Visit | Attending: Radiation Oncology | Admitting: Radiation Oncology

## 2020-02-28 DIAGNOSIS — C50311 Malignant neoplasm of lower-inner quadrant of right female breast: Secondary | ICD-10-CM | POA: Diagnosis not present

## 2020-02-28 DIAGNOSIS — C773 Secondary and unspecified malignant neoplasm of axilla and upper limb lymph nodes: Secondary | ICD-10-CM | POA: Insufficient documentation

## 2020-03-04 ENCOUNTER — Other Ambulatory Visit: Payer: Self-pay | Admitting: *Deleted

## 2020-03-04 DIAGNOSIS — Z170 Estrogen receptor positive status [ER+]: Secondary | ICD-10-CM

## 2020-03-04 DIAGNOSIS — C50311 Malignant neoplasm of lower-inner quadrant of right female breast: Secondary | ICD-10-CM

## 2020-03-06 ENCOUNTER — Ambulatory Visit: Payer: BC Managed Care – PPO

## 2020-03-07 ENCOUNTER — Ambulatory Visit: Admission: RE | Admit: 2020-03-07 | Payer: BC Managed Care – PPO | Source: Ambulatory Visit

## 2020-03-07 ENCOUNTER — Ambulatory Visit: Payer: BC Managed Care – PPO

## 2020-03-07 DIAGNOSIS — C50311 Malignant neoplasm of lower-inner quadrant of right female breast: Secondary | ICD-10-CM | POA: Insufficient documentation

## 2020-03-07 DIAGNOSIS — C773 Secondary and unspecified malignant neoplasm of axilla and upper limb lymph nodes: Secondary | ICD-10-CM | POA: Diagnosis not present

## 2020-03-08 ENCOUNTER — Ambulatory Visit: Payer: BC Managed Care – PPO

## 2020-03-11 ENCOUNTER — Ambulatory Visit
Admission: RE | Admit: 2020-03-11 | Discharge: 2020-03-11 | Disposition: A | Discharge status: Home / Self Care | Payer: BC Managed Care – PPO | Source: Ambulatory Visit | Attending: Radiation Oncology | Admitting: Radiation Oncology

## 2020-03-11 DIAGNOSIS — C50311 Malignant neoplasm of lower-inner quadrant of right female breast: Secondary | ICD-10-CM | POA: Diagnosis not present

## 2020-03-12 ENCOUNTER — Ambulatory Visit
Admission: RE | Admit: 2020-03-12 | Discharge: 2020-03-12 | Disposition: A | Discharge status: Home / Self Care | Payer: BC Managed Care – PPO | Source: Ambulatory Visit | Attending: Radiation Oncology | Admitting: Radiation Oncology

## 2020-03-12 DIAGNOSIS — C50311 Malignant neoplasm of lower-inner quadrant of right female breast: Secondary | ICD-10-CM | POA: Diagnosis not present

## 2020-03-13 ENCOUNTER — Ambulatory Visit
Admission: RE | Admit: 2020-03-13 | Discharge: 2020-03-13 | Disposition: A | Discharge status: Home / Self Care | Payer: BC Managed Care – PPO | Source: Ambulatory Visit | Attending: Radiation Oncology | Admitting: Radiation Oncology

## 2020-03-13 DIAGNOSIS — C50311 Malignant neoplasm of lower-inner quadrant of right female breast: Secondary | ICD-10-CM | POA: Diagnosis not present

## 2020-03-14 ENCOUNTER — Ambulatory Visit
Admission: RE | Admit: 2020-03-14 | Discharge: 2020-03-14 | Disposition: A | Discharge status: Home / Self Care | Payer: BC Managed Care – PPO | Source: Ambulatory Visit | Attending: Radiation Oncology | Admitting: Radiation Oncology

## 2020-03-14 DIAGNOSIS — C50311 Malignant neoplasm of lower-inner quadrant of right female breast: Secondary | ICD-10-CM | POA: Diagnosis not present

## 2020-03-15 ENCOUNTER — Ambulatory Visit
Admission: RE | Admit: 2020-03-15 | Discharge: 2020-03-15 | Disposition: A | Discharge status: Home / Self Care | Payer: BC Managed Care – PPO | Source: Ambulatory Visit | Attending: Radiation Oncology | Admitting: Radiation Oncology

## 2020-03-15 DIAGNOSIS — C50311 Malignant neoplasm of lower-inner quadrant of right female breast: Secondary | ICD-10-CM | POA: Diagnosis not present

## 2020-03-18 ENCOUNTER — Ambulatory Visit
Admission: RE | Admit: 2020-03-18 | Discharge: 2020-03-18 | Disposition: A | Discharge status: Home / Self Care | Payer: BC Managed Care – PPO | Source: Ambulatory Visit | Attending: Radiation Oncology | Admitting: Radiation Oncology

## 2020-03-18 DIAGNOSIS — C50311 Malignant neoplasm of lower-inner quadrant of right female breast: Secondary | ICD-10-CM | POA: Diagnosis not present

## 2020-03-19 ENCOUNTER — Ambulatory Visit
Admission: RE | Admit: 2020-03-19 | Discharge: 2020-03-19 | Disposition: A | Discharge status: Home / Self Care | Payer: BC Managed Care – PPO | Source: Ambulatory Visit | Attending: Radiation Oncology | Admitting: Radiation Oncology

## 2020-03-19 DIAGNOSIS — C50311 Malignant neoplasm of lower-inner quadrant of right female breast: Secondary | ICD-10-CM | POA: Diagnosis not present

## 2020-03-20 ENCOUNTER — Other Ambulatory Visit: Payer: Self-pay

## 2020-03-20 ENCOUNTER — Ambulatory Visit
Admission: RE | Admit: 2020-03-20 | Discharge: 2020-03-20 | Disposition: A | Discharge status: Home / Self Care | Payer: BC Managed Care – PPO | Source: Ambulatory Visit | Attending: Radiation Oncology | Admitting: Radiation Oncology

## 2020-03-20 ENCOUNTER — Inpatient Hospital Stay: Payer: BC Managed Care – PPO | Attending: Oncology

## 2020-03-20 DIAGNOSIS — C50311 Malignant neoplasm of lower-inner quadrant of right female breast: Secondary | ICD-10-CM | POA: Insufficient documentation

## 2020-03-20 DIAGNOSIS — C50919 Malignant neoplasm of unspecified site of unspecified female breast: Secondary | ICD-10-CM

## 2020-03-20 DIAGNOSIS — Z170 Estrogen receptor positive status [ER+]: Secondary | ICD-10-CM | POA: Insufficient documentation

## 2020-03-20 LAB — CBC WITH DIFFERENTIAL/PLATELET
Abs Immature Granulocytes: 0.02 10*3/uL (ref 0.00–0.07)
Basophils Absolute: 0 10*3/uL (ref 0.0–0.1)
Basophils Relative: 1 %
Eosinophils Absolute: 0.4 10*3/uL (ref 0.0–0.5)
Eosinophils Relative: 7 %
HCT: 37.4 % (ref 36.0–46.0)
Hemoglobin: 12.1 g/dL (ref 12.0–15.0)
Immature Granulocytes: 0 %
Lymphocytes Relative: 31 %
Lymphs Abs: 1.9 10*3/uL (ref 0.7–4.0)
MCH: 27.8 pg (ref 26.0–34.0)
MCHC: 32.4 g/dL (ref 30.0–36.0)
MCV: 85.8 fL (ref 80.0–100.0)
Monocytes Absolute: 0.5 10*3/uL (ref 0.1–1.0)
Monocytes Relative: 8 %
Neutro Abs: 3.2 10*3/uL (ref 1.7–7.7)
Neutrophils Relative %: 53 %
Platelets: 300 10*3/uL (ref 150–400)
RBC: 4.36 MIL/uL (ref 3.87–5.11)
RDW: 12.9 % (ref 11.5–15.5)
WBC: 6 10*3/uL (ref 4.0–10.5)
nRBC: 0 % (ref 0.0–0.2)

## 2020-03-20 LAB — COMPREHENSIVE METABOLIC PANEL
ALT: 18 U/L (ref 0–44)
AST: 21 U/L (ref 15–41)
Albumin: 4.2 g/dL (ref 3.5–5.0)
Alkaline Phosphatase: 102 U/L (ref 38–126)
Anion gap: 9 (ref 5–15)
BUN: 9 mg/dL (ref 6–20)
CO2: 29 mmol/L (ref 22–32)
Calcium: 9.1 mg/dL (ref 8.9–10.3)
Chloride: 103 mmol/L (ref 98–111)
Creatinine, Ser: 0.78 mg/dL (ref 0.44–1.00)
GFR calc Af Amer: >60 mL/min (ref >60)
GFR calc non Af Amer: >60 mL/min (ref >60)
Glucose, Bld: 142 mg/dL — ABNORMAL HIGH (ref 70–99)
Potassium: 3.4 mmol/L — ABNORMAL LOW (ref 3.5–5.1)
Sodium: 141 mmol/L (ref 135–145)
Total Bilirubin: 0.7 mg/dL (ref 0.3–1.2)
Total Protein: 7.5 g/dL (ref 6.5–8.1)

## 2020-03-21 ENCOUNTER — Ambulatory Visit
Admission: RE | Admit: 2020-03-21 | Discharge: 2020-03-21 | Disposition: A | Discharge status: Home / Self Care | Payer: BC Managed Care – PPO | Source: Ambulatory Visit | Attending: Radiation Oncology | Admitting: Radiation Oncology

## 2020-03-21 DIAGNOSIS — C50311 Malignant neoplasm of lower-inner quadrant of right female breast: Secondary | ICD-10-CM | POA: Diagnosis not present

## 2020-03-22 ENCOUNTER — Ambulatory Visit
Admission: RE | Admit: 2020-03-22 | Discharge: 2020-03-22 | Disposition: A | Discharge status: Home / Self Care | Payer: BC Managed Care – PPO | Source: Ambulatory Visit | Attending: Radiation Oncology | Admitting: Radiation Oncology

## 2020-03-22 DIAGNOSIS — C50311 Malignant neoplasm of lower-inner quadrant of right female breast: Secondary | ICD-10-CM | POA: Diagnosis not present

## 2020-03-25 ENCOUNTER — Ambulatory Visit
Admission: RE | Admit: 2020-03-25 | Discharge: 2020-03-25 | Disposition: A | Discharge status: Home / Self Care | Payer: BC Managed Care – PPO | Source: Ambulatory Visit | Attending: Radiation Oncology | Admitting: Radiation Oncology

## 2020-03-25 DIAGNOSIS — C50311 Malignant neoplasm of lower-inner quadrant of right female breast: Secondary | ICD-10-CM | POA: Diagnosis not present

## 2020-03-26 ENCOUNTER — Ambulatory Visit
Admission: RE | Admit: 2020-03-26 | Discharge: 2020-03-26 | Disposition: A | Discharge status: Home / Self Care | Payer: BC Managed Care – PPO | Source: Ambulatory Visit | Attending: Radiation Oncology | Admitting: Radiation Oncology

## 2020-03-26 DIAGNOSIS — C50311 Malignant neoplasm of lower-inner quadrant of right female breast: Secondary | ICD-10-CM | POA: Diagnosis not present

## 2020-03-27 ENCOUNTER — Ambulatory Visit
Admission: RE | Admit: 2020-03-27 | Discharge: 2020-03-27 | Disposition: A | Discharge status: Home / Self Care | Payer: BC Managed Care – PPO | Source: Ambulatory Visit | Attending: Radiation Oncology | Admitting: Radiation Oncology

## 2020-03-27 DIAGNOSIS — C50311 Malignant neoplasm of lower-inner quadrant of right female breast: Secondary | ICD-10-CM | POA: Diagnosis not present

## 2020-03-28 ENCOUNTER — Ambulatory Visit
Admission: RE | Admit: 2020-03-28 | Discharge: 2020-03-28 | Disposition: A | Discharge status: Home / Self Care | Payer: BC Managed Care – PPO | Source: Ambulatory Visit | Attending: Radiation Oncology | Admitting: Radiation Oncology

## 2020-03-28 DIAGNOSIS — C50311 Malignant neoplasm of lower-inner quadrant of right female breast: Secondary | ICD-10-CM | POA: Diagnosis not present

## 2020-03-29 ENCOUNTER — Ambulatory Visit
Admission: RE | Admit: 2020-03-29 | Discharge: 2020-03-29 | Disposition: A | Discharge status: Home / Self Care | Payer: BC Managed Care – PPO | Source: Ambulatory Visit | Attending: Radiation Oncology | Admitting: Radiation Oncology

## 2020-03-29 DIAGNOSIS — C50311 Malignant neoplasm of lower-inner quadrant of right female breast: Secondary | ICD-10-CM | POA: Diagnosis not present

## 2020-04-02 ENCOUNTER — Ambulatory Visit
Admission: RE | Admit: 2020-04-02 | Discharge: 2020-04-02 | Disposition: A | Discharge status: Home / Self Care | Payer: BC Managed Care – PPO | Source: Ambulatory Visit | Attending: Radiation Oncology | Admitting: Radiation Oncology

## 2020-04-02 DIAGNOSIS — C50311 Malignant neoplasm of lower-inner quadrant of right female breast: Secondary | ICD-10-CM | POA: Diagnosis not present

## 2020-04-02 DIAGNOSIS — C773 Secondary and unspecified malignant neoplasm of axilla and upper limb lymph nodes: Secondary | ICD-10-CM | POA: Insufficient documentation

## 2020-04-03 ENCOUNTER — Other Ambulatory Visit: Payer: Self-pay

## 2020-04-03 ENCOUNTER — Ambulatory Visit
Admission: RE | Admit: 2020-04-03 | Discharge: 2020-04-03 | Disposition: A | Discharge status: Home / Self Care | Payer: BC Managed Care – PPO | Source: Ambulatory Visit | Attending: Radiation Oncology | Admitting: Radiation Oncology

## 2020-04-03 ENCOUNTER — Inpatient Hospital Stay: Payer: BC Managed Care – PPO | Attending: Oncology

## 2020-04-03 DIAGNOSIS — Z170 Estrogen receptor positive status [ER+]: Secondary | ICD-10-CM | POA: Diagnosis not present

## 2020-04-03 DIAGNOSIS — C50311 Malignant neoplasm of lower-inner quadrant of right female breast: Secondary | ICD-10-CM | POA: Insufficient documentation

## 2020-04-03 LAB — CBC
HCT: 36.4 % (ref 36.0–46.0)
Hemoglobin: 12.1 g/dL (ref 12.0–15.0)
MCH: 27.9 pg (ref 26.0–34.0)
MCHC: 33.2 g/dL (ref 30.0–36.0)
MCV: 84.1 fL (ref 80.0–100.0)
Platelets: 263 10*3/uL (ref 150–400)
RBC: 4.33 MIL/uL (ref 3.87–5.11)
RDW: 13.2 % (ref 11.5–15.5)
WBC: 4.8 10*3/uL (ref 4.0–10.5)
nRBC: 0 % (ref 0.0–0.2)

## 2020-04-04 ENCOUNTER — Other Ambulatory Visit: Payer: Self-pay | Admitting: Oncology

## 2020-04-04 ENCOUNTER — Telehealth: Payer: Self-pay

## 2020-04-04 ENCOUNTER — Ambulatory Visit
Admission: RE | Admit: 2020-04-04 | Discharge: 2020-04-04 | Disposition: A | Discharge status: Home / Self Care | Payer: BC Managed Care – PPO | Source: Ambulatory Visit | Attending: Radiation Oncology | Admitting: Radiation Oncology

## 2020-04-04 DIAGNOSIS — C50311 Malignant neoplasm of lower-inner quadrant of right female breast: Secondary | ICD-10-CM | POA: Diagnosis not present

## 2020-04-04 NOTE — Telephone Encounter (Signed)
Pt's final rad tx in on 6/29. Please reschedule appts on 6/8 to 1 week after last RT treatment. Please inform pt of appt change. Thanks.  Lab/MD/ zoladex *NEW*

## 2020-04-04 NOTE — Telephone Encounter (Signed)
Done...  Pt 04/09/20 lab/MD appt has been cx as requested Pt is sched to RTC 1 week after last [place] appt for lab/MD/*NEW* Zoladex as requested A detailed message was left on pt vmail making her aware.

## 2020-04-05 ENCOUNTER — Ambulatory Visit
Admission: RE | Admit: 2020-04-05 | Discharge: 2020-04-05 | Disposition: A | Discharge status: Home / Self Care | Payer: BC Managed Care – PPO | Source: Ambulatory Visit | Attending: Radiation Oncology | Admitting: Radiation Oncology

## 2020-04-05 DIAGNOSIS — C50311 Malignant neoplasm of lower-inner quadrant of right female breast: Secondary | ICD-10-CM | POA: Diagnosis not present

## 2020-04-06 ENCOUNTER — Ambulatory Visit: Payer: BC Managed Care – PPO

## 2020-04-08 ENCOUNTER — Ambulatory Visit
Admission: RE | Admit: 2020-04-08 | Discharge: 2020-04-08 | Disposition: A | Discharge status: Home / Self Care | Payer: BC Managed Care – PPO | Source: Ambulatory Visit | Attending: Radiation Oncology | Admitting: Radiation Oncology

## 2020-04-08 DIAGNOSIS — C50311 Malignant neoplasm of lower-inner quadrant of right female breast: Secondary | ICD-10-CM | POA: Diagnosis not present

## 2020-04-09 ENCOUNTER — Ambulatory Visit
Admission: RE | Admit: 2020-04-09 | Discharge: 2020-04-09 | Disposition: A | Discharge status: Home / Self Care | Payer: BC Managed Care – PPO | Source: Ambulatory Visit | Attending: Radiation Oncology | Admitting: Radiation Oncology

## 2020-04-09 ENCOUNTER — Inpatient Hospital Stay: Payer: BC Managed Care – PPO | Admitting: Oncology

## 2020-04-09 ENCOUNTER — Inpatient Hospital Stay: Payer: BC Managed Care – PPO

## 2020-04-09 DIAGNOSIS — C50311 Malignant neoplasm of lower-inner quadrant of right female breast: Secondary | ICD-10-CM | POA: Diagnosis not present

## 2020-04-09 IMAGING — US US BREAST*R* LIMITED INC AXILLA
1 series · 11 of 11 positions shown · non-contrast
Comparison: 10/02/2019 and prior studies

CLINICAL DATA: 43-year-old female with biopsy-proven invasive RIGHT
breast cancer and RIGHT axillary lymph node metastasis. Evaluate
response to neoadjuvant therapy.

EXAM:
DIGITAL DIAGNOSTIC RIGHT MAMMOGRAM WITH CAD AND TOMO
ULTRASOUND RIGHT BREAST

[Series 1: us breast*right* limited inc axilla · 0.06mm/px · 11 of 11 slices shown]
[im 1/11]
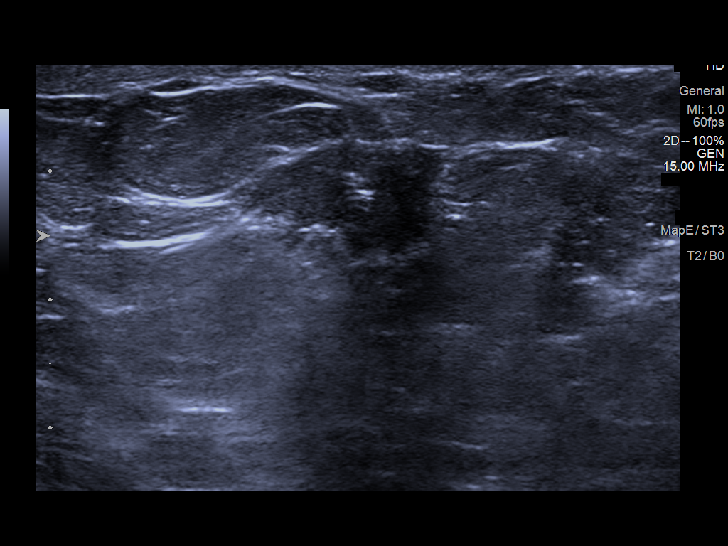
[im 2/11]
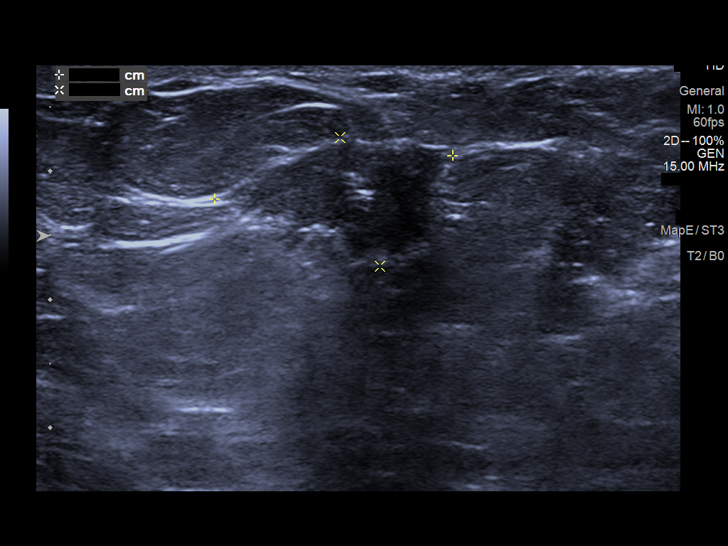
[im 3/11]
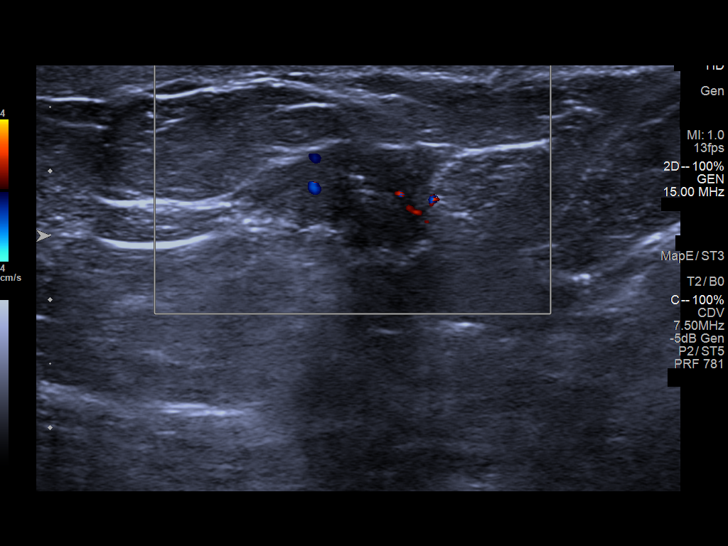
[im 4/11]
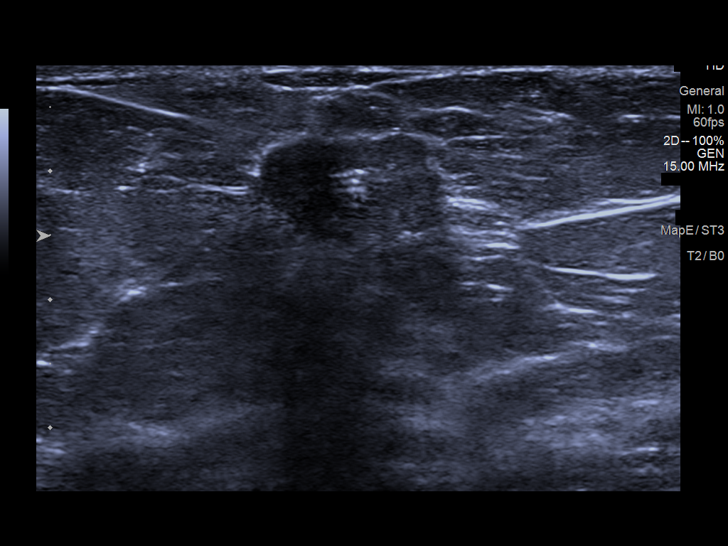
[im 5/11]
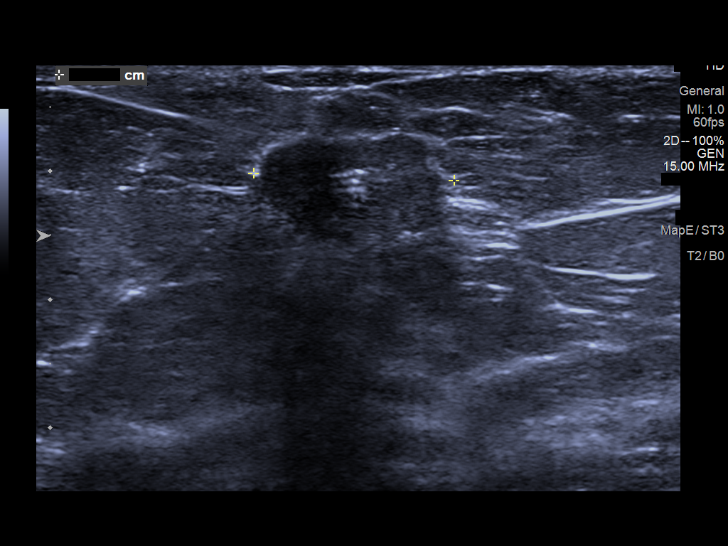
[im 6/11]
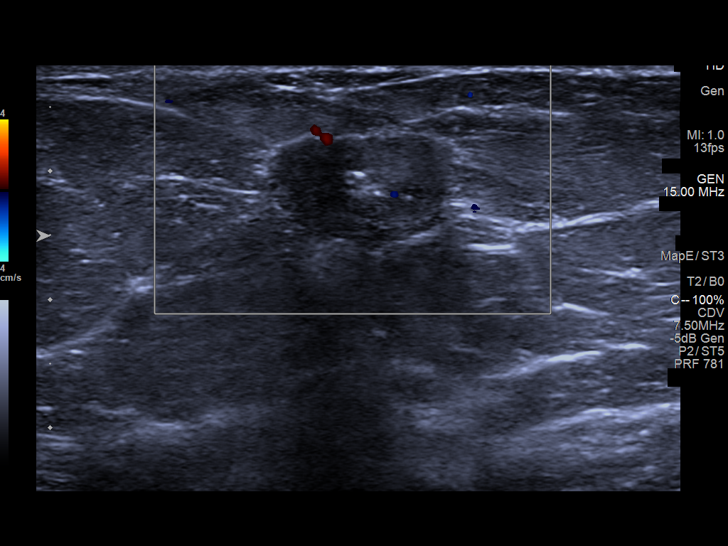
[im 7/11]
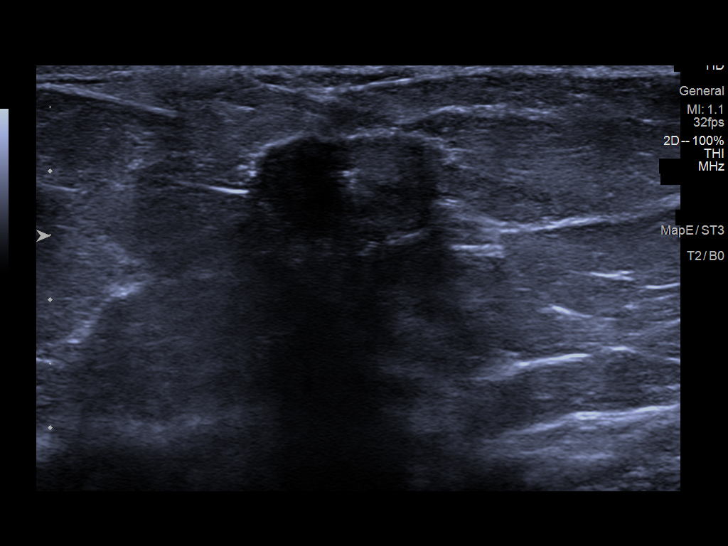
[im 8/11]
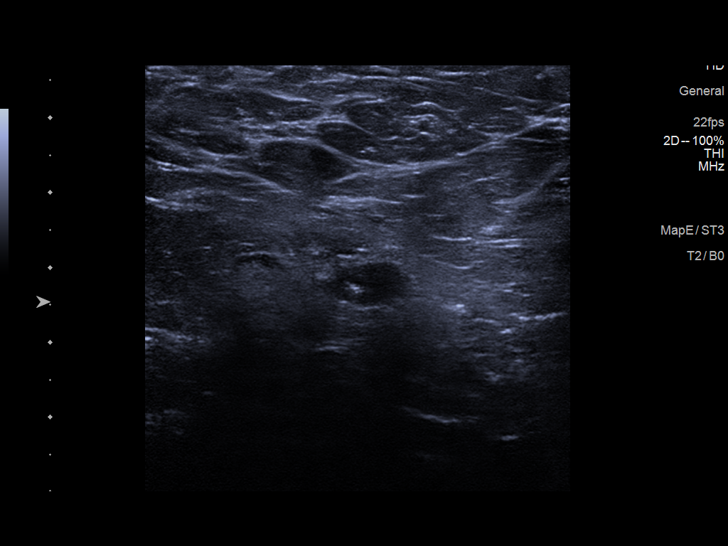
[im 9/11]
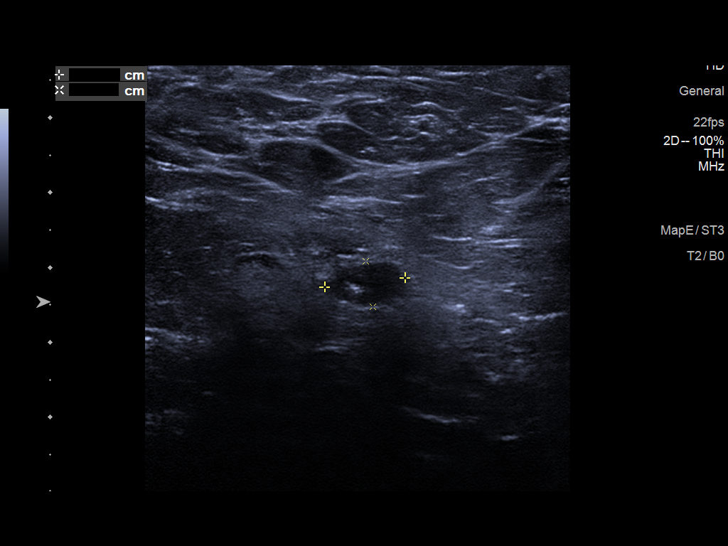
[im 10/11]
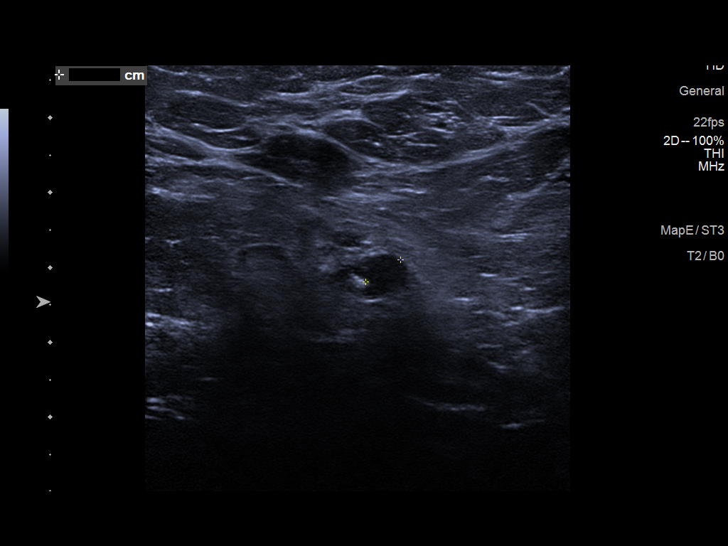
[im 11/11]
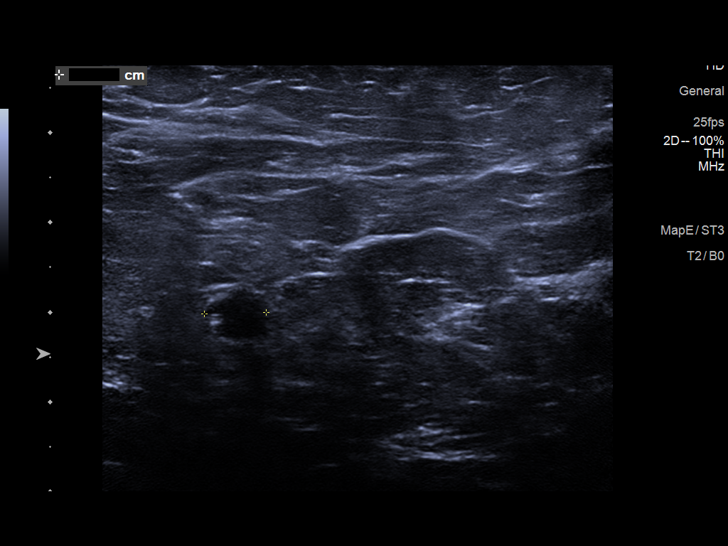

[11 of 11 positions shown; findings below may reference images not displayed]

ACR Breast Density Category b: There are scattered areas of
fibroglandular density.
FINDINGS: 2D/3D full field views of the RIGHT breast demonstrate decreased
size of the known malignancy within the LOWER INNER RIGHT breast
since 10/02/2019, and again is noted to contain a HEART shaped
biopsy clip.

No new RIGHT breast abnormalities are noted.

Mammographic images were processed with CAD.

Targeted ultrasound is performed, showing a 1.9 x 1.1 x 1.6 cm
hypoechoic mass at the 5 o'clock position of the RIGHT breast 3 cm
from the nipple, previously 2.8 x 1.6 x 2.8 cm.

A 1.1 x 0.6 cm solitary enlarged RIGHT axillary lymph node with
cm cortical thickening is noted, previously measuring 3.3 x 1.5 cm
with 1.5 cm cortical thickening.
IMPRESSION: 1. Decreased size of known LOWER INNER RIGHT breast malignancy and
decreased size of RIGHT axillary lymph node metastasis since
10/02/2019, compatible with treatment response.

RECOMMENDATION:
Treatment plan

I have discussed the findings and recommendations with the patient.
If applicable, a reminder letter will be sent to the patient
regarding the next appointment.

BI-RADS CATEGORY  6: Known biopsy-proven malignancy.

## 2020-04-10 ENCOUNTER — Ambulatory Visit: Payer: BC Managed Care – PPO

## 2020-04-10 ENCOUNTER — Ambulatory Visit
Admission: RE | Admit: 2020-04-10 | Discharge: 2020-04-10 | Disposition: A | Discharge status: Home / Self Care | Payer: BC Managed Care – PPO | Source: Ambulatory Visit | Attending: Radiation Oncology | Admitting: Radiation Oncology

## 2020-04-10 DIAGNOSIS — C50311 Malignant neoplasm of lower-inner quadrant of right female breast: Secondary | ICD-10-CM | POA: Diagnosis not present

## 2020-04-11 ENCOUNTER — Ambulatory Visit
Admission: RE | Admit: 2020-04-11 | Discharge: 2020-04-11 | Disposition: A | Discharge status: Home / Self Care | Payer: BC Managed Care – PPO | Source: Ambulatory Visit | Attending: Radiation Oncology | Admitting: Radiation Oncology

## 2020-04-11 DIAGNOSIS — C50311 Malignant neoplasm of lower-inner quadrant of right female breast: Secondary | ICD-10-CM | POA: Diagnosis not present

## 2020-04-12 ENCOUNTER — Ambulatory Visit
Admission: RE | Admit: 2020-04-12 | Discharge: 2020-04-12 | Disposition: A | Discharge status: Home / Self Care | Payer: BC Managed Care – PPO | Source: Ambulatory Visit | Attending: Radiation Oncology | Admitting: Radiation Oncology

## 2020-04-12 DIAGNOSIS — C50311 Malignant neoplasm of lower-inner quadrant of right female breast: Secondary | ICD-10-CM | POA: Diagnosis not present

## 2020-04-15 ENCOUNTER — Ambulatory Visit
Admission: RE | Admit: 2020-04-15 | Discharge: 2020-04-15 | Disposition: A | Discharge status: Home / Self Care | Payer: BC Managed Care – PPO | Source: Ambulatory Visit | Attending: Radiation Oncology | Admitting: Radiation Oncology

## 2020-04-15 DIAGNOSIS — C50311 Malignant neoplasm of lower-inner quadrant of right female breast: Secondary | ICD-10-CM | POA: Diagnosis not present

## 2020-04-16 ENCOUNTER — Ambulatory Visit
Admission: RE | Admit: 2020-04-16 | Discharge: 2020-04-16 | Disposition: A | Discharge status: Home / Self Care | Payer: BC Managed Care – PPO | Source: Ambulatory Visit | Attending: Radiation Oncology | Admitting: Radiation Oncology

## 2020-04-16 DIAGNOSIS — C50311 Malignant neoplasm of lower-inner quadrant of right female breast: Secondary | ICD-10-CM | POA: Diagnosis not present

## 2020-04-17 ENCOUNTER — Ambulatory Visit
Admission: RE | Admit: 2020-04-17 | Discharge: 2020-04-17 | Disposition: A | Discharge status: Home / Self Care | Payer: BC Managed Care – PPO | Source: Ambulatory Visit | Attending: Radiation Oncology | Admitting: Radiation Oncology

## 2020-04-17 DIAGNOSIS — C50311 Malignant neoplasm of lower-inner quadrant of right female breast: Secondary | ICD-10-CM | POA: Diagnosis not present

## 2020-04-18 ENCOUNTER — Ambulatory Visit: Admission: RE | Admit: 2020-04-18 | Payer: BC Managed Care – PPO | Source: Ambulatory Visit

## 2020-04-18 ENCOUNTER — Ambulatory Visit
Admission: RE | Admit: 2020-04-18 | Discharge: 2020-04-18 | Disposition: A | Discharge status: Home / Self Care | Payer: BC Managed Care – PPO | Source: Ambulatory Visit | Attending: Radiation Oncology | Admitting: Radiation Oncology

## 2020-04-18 DIAGNOSIS — C50311 Malignant neoplasm of lower-inner quadrant of right female breast: Secondary | ICD-10-CM | POA: Diagnosis not present

## 2020-04-19 ENCOUNTER — Ambulatory Visit
Admission: RE | Admit: 2020-04-19 | Discharge: 2020-04-19 | Disposition: A | Discharge status: Home / Self Care | Payer: BC Managed Care – PPO | Source: Ambulatory Visit | Attending: Radiation Oncology | Admitting: Radiation Oncology

## 2020-04-19 DIAGNOSIS — C50311 Malignant neoplasm of lower-inner quadrant of right female breast: Secondary | ICD-10-CM | POA: Diagnosis not present

## 2020-04-22 ENCOUNTER — Ambulatory Visit
Admission: RE | Admit: 2020-04-22 | Discharge: 2020-04-22 | Disposition: A | Discharge status: Home / Self Care | Payer: BC Managed Care – PPO | Source: Ambulatory Visit | Attending: Radiation Oncology | Admitting: Radiation Oncology

## 2020-04-22 DIAGNOSIS — C50311 Malignant neoplasm of lower-inner quadrant of right female breast: Secondary | ICD-10-CM | POA: Diagnosis not present

## 2020-04-23 ENCOUNTER — Ambulatory Visit
Admission: RE | Admit: 2020-04-23 | Discharge: 2020-04-23 | Disposition: A | Discharge status: Home / Self Care | Payer: BC Managed Care – PPO | Source: Ambulatory Visit | Attending: Radiation Oncology | Admitting: Radiation Oncology

## 2020-04-23 DIAGNOSIS — C50311 Malignant neoplasm of lower-inner quadrant of right female breast: Secondary | ICD-10-CM | POA: Diagnosis not present

## 2020-04-24 ENCOUNTER — Ambulatory Visit
Admission: RE | Admit: 2020-04-24 | Discharge: 2020-04-24 | Disposition: A | Discharge status: Home / Self Care | Payer: BC Managed Care – PPO | Source: Ambulatory Visit | Attending: Radiation Oncology | Admitting: Radiation Oncology

## 2020-04-24 DIAGNOSIS — C50311 Malignant neoplasm of lower-inner quadrant of right female breast: Secondary | ICD-10-CM | POA: Diagnosis not present

## 2020-04-25 ENCOUNTER — Ambulatory Visit
Admission: RE | Admit: 2020-04-25 | Discharge: 2020-04-25 | Disposition: A | Discharge status: Home / Self Care | Payer: BC Managed Care – PPO | Source: Ambulatory Visit | Attending: Radiation Oncology | Admitting: Radiation Oncology

## 2020-04-25 DIAGNOSIS — C50311 Malignant neoplasm of lower-inner quadrant of right female breast: Secondary | ICD-10-CM | POA: Diagnosis not present

## 2020-04-26 ENCOUNTER — Ambulatory Visit
Admission: RE | Admit: 2020-04-26 | Discharge: 2020-04-26 | Disposition: A | Discharge status: Home / Self Care | Payer: BC Managed Care – PPO | Source: Ambulatory Visit | Attending: Radiation Oncology | Admitting: Radiation Oncology

## 2020-04-26 DIAGNOSIS — C50311 Malignant neoplasm of lower-inner quadrant of right female breast: Secondary | ICD-10-CM | POA: Diagnosis not present

## 2020-04-29 ENCOUNTER — Ambulatory Visit
Admission: RE | Admit: 2020-04-29 | Discharge: 2020-04-29 | Disposition: A | Discharge status: Home / Self Care | Payer: BC Managed Care – PPO | Source: Ambulatory Visit | Attending: Radiation Oncology | Admitting: Radiation Oncology

## 2020-04-29 DIAGNOSIS — C50311 Malignant neoplasm of lower-inner quadrant of right female breast: Secondary | ICD-10-CM | POA: Diagnosis not present

## 2020-04-29 IMAGING — MG MM DIGITAL DIAGNOSTIC UNILAT*R* W/ TOMO W/ CAD
5 series · 6 of 13 positions shown · non-contrast
Comparison: Previous exam(s).

CLINICAL DATA: Post radiofrequency tag localization of a mass in
the right breast and a right axillary lymph node.

EXAM:
DIAGNOSTIC RIGHT MAMMOGRAM POST ULTRASOUND BIOPSY

[R MLO]
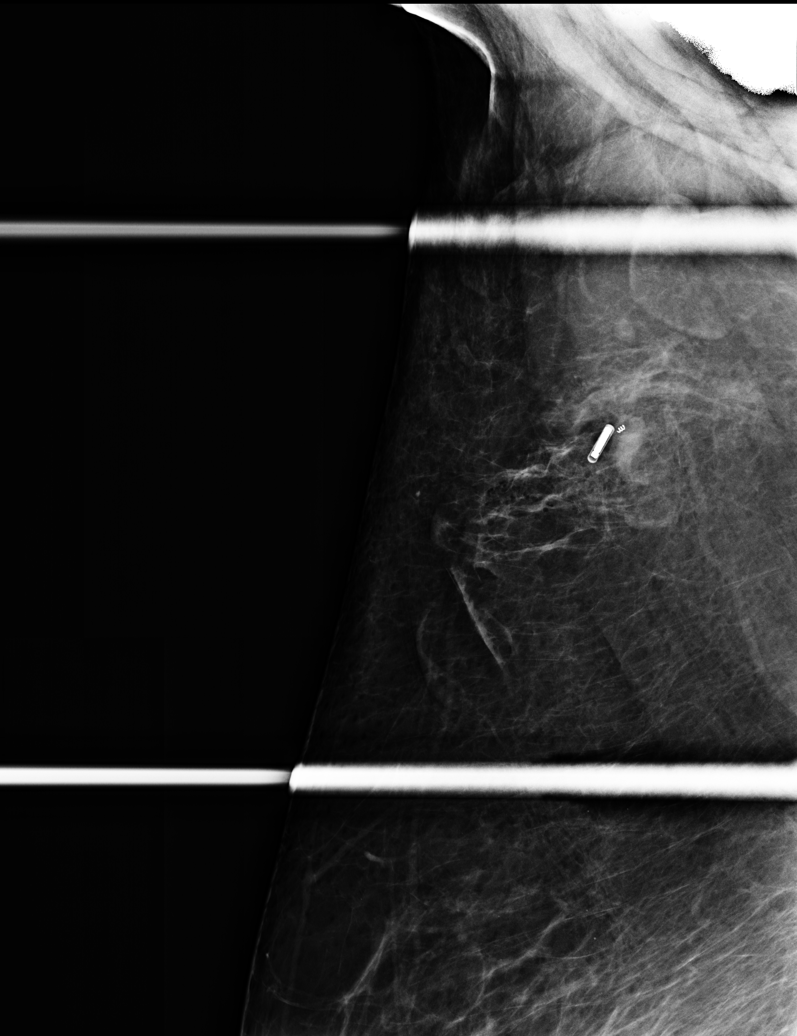

[R CC synth-2D]
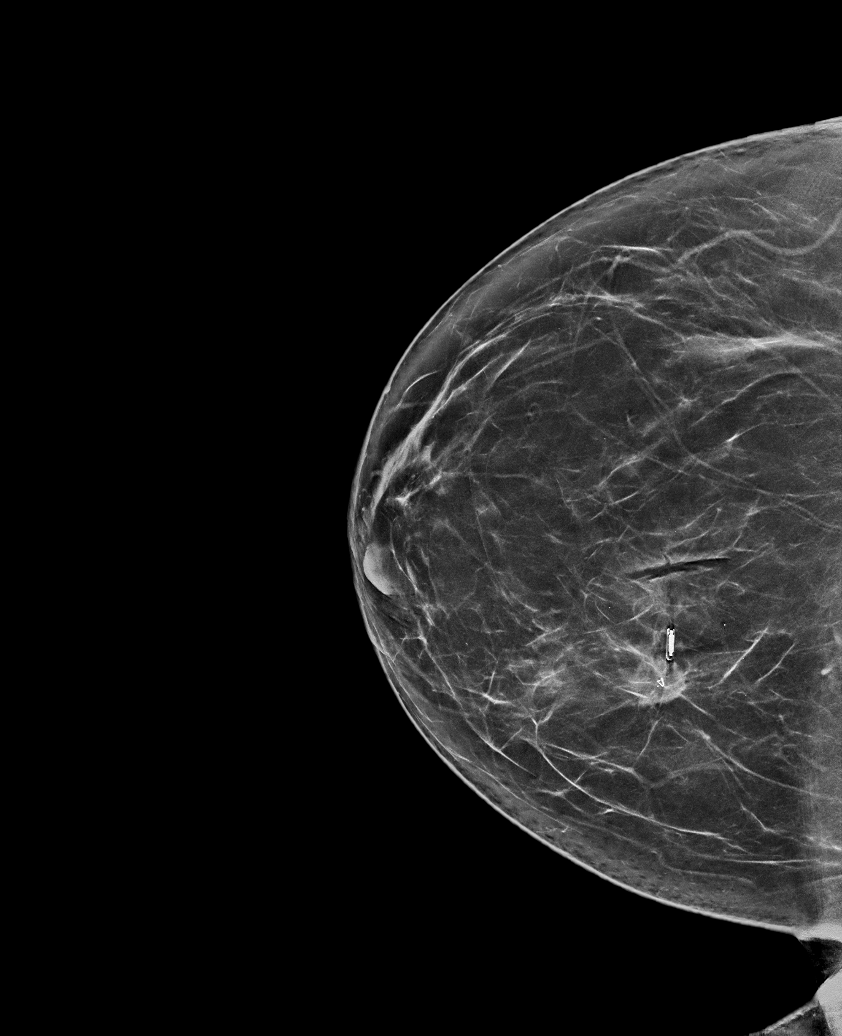

[R ML synth-2D]
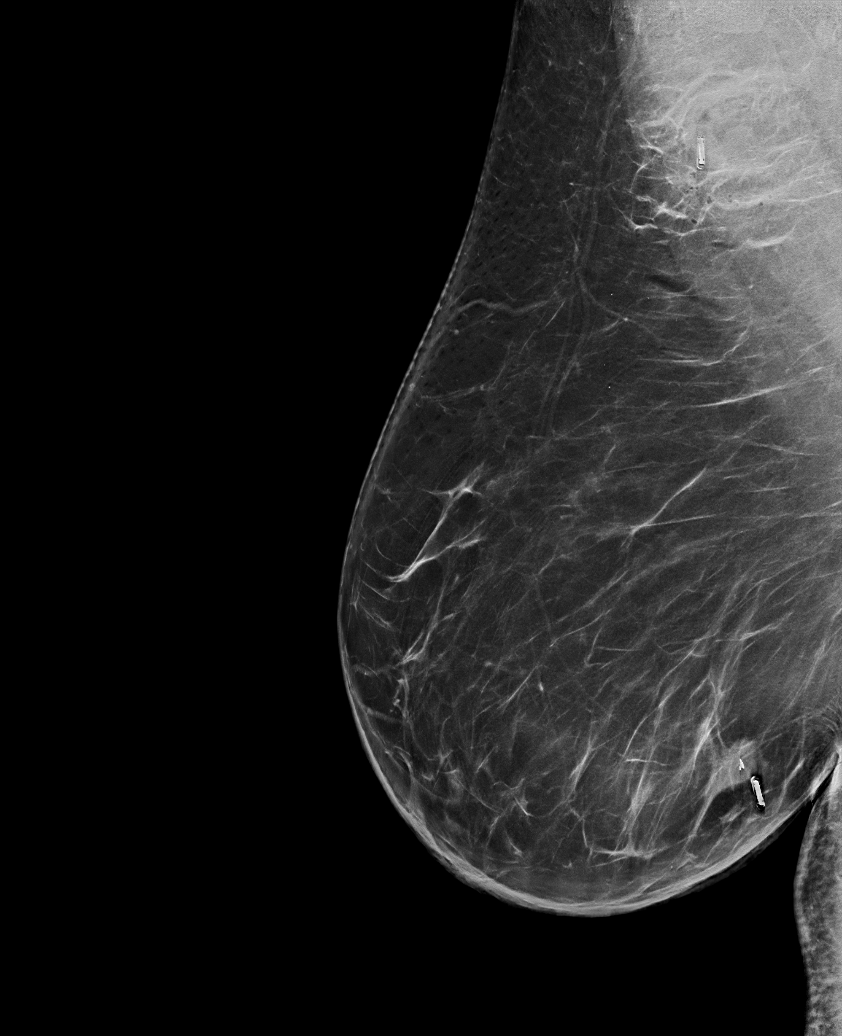

[R ML tomo · 2 of 102 frames shown]
[frame 33/102]
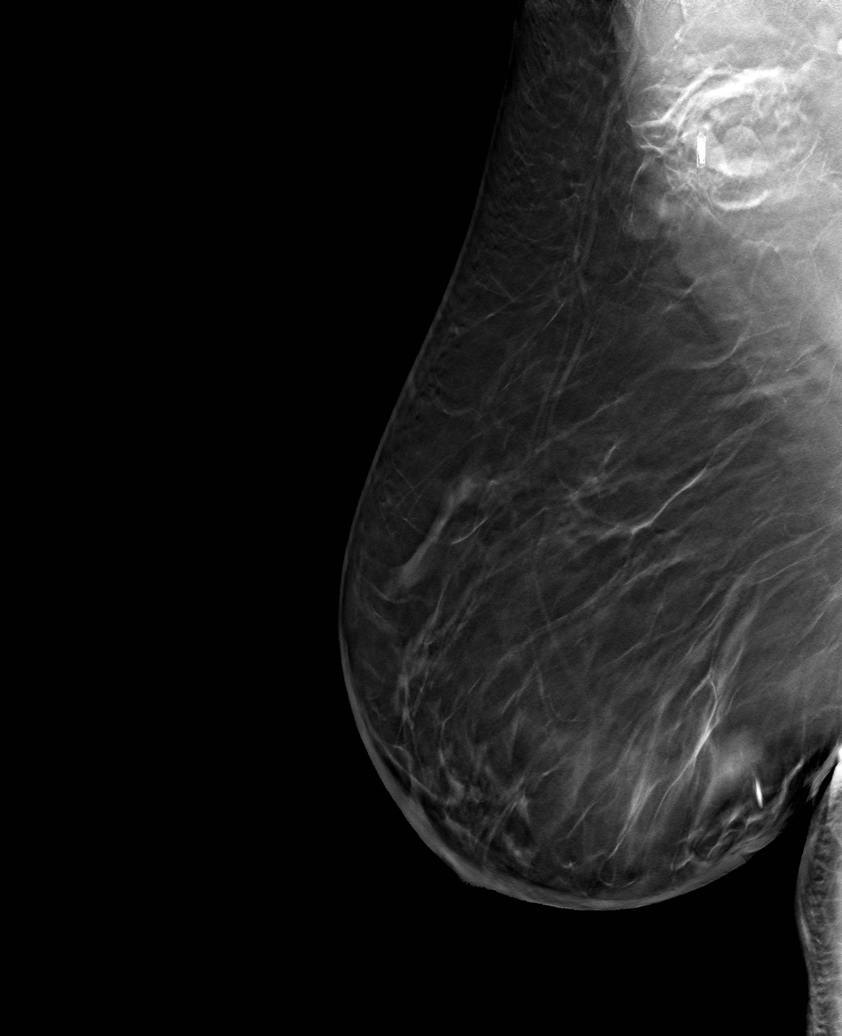
[frame 51/102]
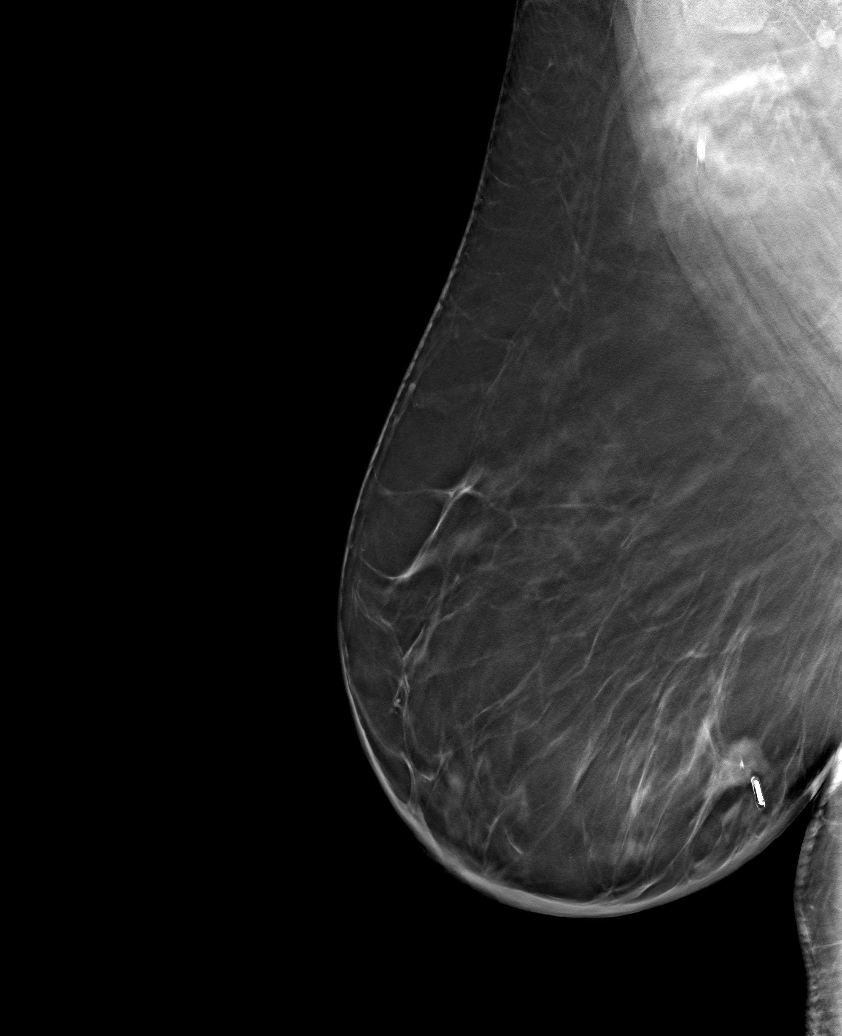

[R CC tomo · tomo slice 41/82.0]
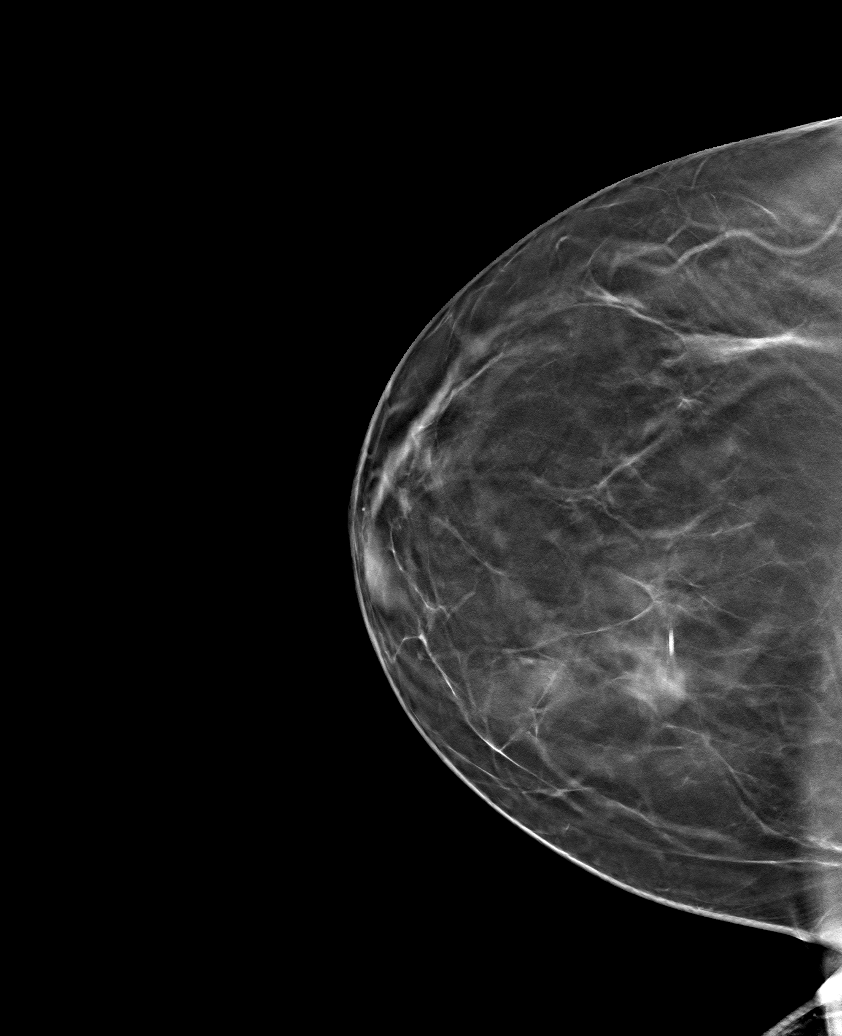

[6 of 13 positions shown; findings below may reference images not displayed]

FINDINGS: Mammographic images were obtained following ultrasound guided biopsy
of a mass in the right breast at 5 o'clock and a right axillary
lymph node. The localizing Tag is in expected position adjacent to
the mass with biopsy marking clip in the lower-inner quadrant. 2D
image over the right axilla demonstrates that the localizing Tag is
immediately adjacent to the HydroMARK biopsy marking clip.
IMPRESSION: Appropriate positioning of the RF Tags are appropriately positioned
at the mass in the lower-inner right breast and at the right
axillary lymph node.

Final Assessment: Post Procedure Mammograms for Marker Placement

## 2020-04-30 ENCOUNTER — Ambulatory Visit
Admission: RE | Admit: 2020-04-30 | Discharge: 2020-04-30 | Disposition: A | Discharge status: Home / Self Care | Payer: BC Managed Care – PPO | Source: Ambulatory Visit | Attending: Radiation Oncology | Admitting: Radiation Oncology

## 2020-04-30 DIAGNOSIS — C50311 Malignant neoplasm of lower-inner quadrant of right female breast: Secondary | ICD-10-CM | POA: Diagnosis not present

## 2020-05-07 ENCOUNTER — Inpatient Hospital Stay: Payer: BC Managed Care – PPO | Attending: Oncology

## 2020-05-07 ENCOUNTER — Telehealth: Payer: Self-pay

## 2020-05-07 ENCOUNTER — Inpatient Hospital Stay (HOSPITAL_BASED_OUTPATIENT_CLINIC_OR_DEPARTMENT_OTHER): Payer: BC Managed Care – PPO | Admitting: Oncology

## 2020-05-07 ENCOUNTER — Inpatient Hospital Stay: Payer: BC Managed Care – PPO

## 2020-05-07 ENCOUNTER — Telehealth: Payer: Self-pay | Admitting: Oncology

## 2020-05-07 ENCOUNTER — Other Ambulatory Visit: Payer: Self-pay

## 2020-05-07 ENCOUNTER — Encounter: Payer: Self-pay | Admitting: Oncology

## 2020-05-07 VITALS — BP 120/85 | HR 80 | Temp 96.6°F | Resp 18 | Wt 214.6 lb

## 2020-05-07 DIAGNOSIS — E2839 Other primary ovarian failure: Secondary | ICD-10-CM | POA: Diagnosis not present

## 2020-05-07 DIAGNOSIS — R5383 Other fatigue: Secondary | ICD-10-CM | POA: Diagnosis not present

## 2020-05-07 DIAGNOSIS — G620 Drug-induced polyneuropathy: Secondary | ICD-10-CM | POA: Diagnosis not present

## 2020-05-07 DIAGNOSIS — Z79818 Long term (current) use of other agents affecting estrogen receptors and estrogen levels: Secondary | ICD-10-CM | POA: Insufficient documentation

## 2020-05-07 DIAGNOSIS — Z170 Estrogen receptor positive status [ER+]: Secondary | ICD-10-CM

## 2020-05-07 DIAGNOSIS — Z9221 Personal history of antineoplastic chemotherapy: Secondary | ICD-10-CM | POA: Insufficient documentation

## 2020-05-07 DIAGNOSIS — Z923 Personal history of irradiation: Secondary | ICD-10-CM | POA: Insufficient documentation

## 2020-05-07 DIAGNOSIS — T451X5A Adverse effect of antineoplastic and immunosuppressive drugs, initial encounter: Secondary | ICD-10-CM | POA: Insufficient documentation

## 2020-05-07 DIAGNOSIS — K219 Gastro-esophageal reflux disease without esophagitis: Secondary | ICD-10-CM | POA: Insufficient documentation

## 2020-05-07 DIAGNOSIS — Z79899 Other long term (current) drug therapy: Secondary | ICD-10-CM | POA: Diagnosis not present

## 2020-05-07 DIAGNOSIS — M1990 Unspecified osteoarthritis, unspecified site: Secondary | ICD-10-CM | POA: Insufficient documentation

## 2020-05-07 DIAGNOSIS — C50311 Malignant neoplasm of lower-inner quadrant of right female breast: Secondary | ICD-10-CM

## 2020-05-07 DIAGNOSIS — Z95828 Presence of other vascular implants and grafts: Secondary | ICD-10-CM

## 2020-05-07 DIAGNOSIS — C50919 Malignant neoplasm of unspecified site of unspecified female breast: Secondary | ICD-10-CM

## 2020-05-07 LAB — CBC WITH DIFFERENTIAL/PLATELET
Abs Immature Granulocytes: 0 10*3/uL (ref 0.00–0.07)
Basophils Absolute: 0 10*3/uL (ref 0.0–0.1)
Basophils Relative: 1 %
Eosinophils Absolute: 0.3 10*3/uL (ref 0.0–0.5)
Eosinophils Relative: 8 %
HCT: 37.9 % (ref 36.0–46.0)
Hemoglobin: 12.5 g/dL (ref 12.0–15.0)
Immature Granulocytes: 0 %
Lymphocytes Relative: 27 %
Lymphs Abs: 1.1 10*3/uL (ref 0.7–4.0)
MCH: 27.7 pg (ref 26.0–34.0)
MCHC: 33 g/dL (ref 30.0–36.0)
MCV: 83.8 fL (ref 80.0–100.0)
Monocytes Absolute: 0.5 10*3/uL (ref 0.1–1.0)
Monocytes Relative: 11 %
Neutro Abs: 2.3 10*3/uL (ref 1.7–7.7)
Neutrophils Relative %: 53 %
Platelets: 236 10*3/uL (ref 150–400)
RBC: 4.52 MIL/uL (ref 3.87–5.11)
RDW: 13.4 % (ref 11.5–15.5)
WBC: 4.2 10*3/uL (ref 4.0–10.5)
nRBC: 0 % (ref 0.0–0.2)

## 2020-05-07 LAB — COMPREHENSIVE METABOLIC PANEL
ALT: 20 U/L (ref 0–44)
AST: 20 U/L (ref 15–41)
Albumin: 4.1 g/dL (ref 3.5–5.0)
Alkaline Phosphatase: 109 U/L (ref 38–126)
Anion gap: 9 (ref 5–15)
BUN: 10 mg/dL (ref 6–20)
CO2: 28 mmol/L (ref 22–32)
Calcium: 8.9 mg/dL (ref 8.9–10.3)
Chloride: 104 mmol/L (ref 98–111)
Creatinine, Ser: 0.63 mg/dL (ref 0.44–1.00)
GFR calc Af Amer: >60 mL/min (ref >60)
GFR calc non Af Amer: >60 mL/min (ref >60)
Glucose, Bld: 101 mg/dL — ABNORMAL HIGH (ref 70–99)
Potassium: 3.8 mmol/L (ref 3.5–5.1)
Sodium: 141 mmol/L (ref 135–145)
Total Bilirubin: 0.6 mg/dL (ref 0.3–1.2)
Total Protein: 7.5 g/dL (ref 6.5–8.1)

## 2020-05-07 MED ORDER — HEPARIN SOD (PORK) LOCK FLUSH 100 UNIT/ML IV SOLN
500.0000 [IU] | Freq: Once | INTRAVENOUS | Status: AC
  Administered 2020-05-07: 500 [IU] via INTRAVENOUS
  Filled 2020-05-07: qty 5

## 2020-05-07 MED ORDER — GOSERELIN ACETATE 3.6 MG ~~LOC~~ IMPL
3.6000 mg | DRUG_IMPLANT | Freq: Once | SUBCUTANEOUS | Status: AC
  Administered 2020-05-07: 3.6 mg via SUBCUTANEOUS
  Filled 2020-05-07: qty 3.6

## 2020-05-07 MED ORDER — ANASTROZOLE 1 MG PO TABS: 1.0000 mg | ORAL_TABLET | Freq: Every day | ORAL | 0 refills | Status: DC

## 2020-05-07 MED ORDER — SODIUM CHLORIDE 0.9% FLUSH
10.0000 mL | INTRAVENOUS | Status: DC | PRN
  Administered 2020-05-07: 10 mL via INTRAVENOUS
  Filled 2020-05-07: qty 10

## 2020-05-07 NOTE — Progress Notes (Signed)
Patient has bilateral knee pain that is not a new issue but also not improving.  Neuropathy has improved.  She is feeling fatigued from XRT.

## 2020-05-07 NOTE — Telephone Encounter (Signed)
Notified lab to add TSH for labs (order entered).  Will get scheduling to schedule the DEXA.

## 2020-05-07 NOTE — Progress Notes (Signed)
Jessica [name]   [phone] [phone]   Clinic Follow up Note   Patient Care Team: Sharyne Peach, MD as PCP - General (Family Medicine) Rico Junker, RN as Registered Nurse Noreene Filbert, MD as Radiation Oncologist (Radiation Oncology)  Date of Service:  05/07/2020  CHIEF COMPLAINT: F/u of right breast cancer   SUMMARY OF ONCOLOGIC HISTORY: Oncology History Overview Note  Cancer Staging Malignant neoplasm of lower-inner quadrant of right breast of female, estrogen receptor positive ([place]) Staging form: Breast, AJCC 8th Edition - Clinical stage from 07/26/2019: Stage IIB (cT2, cN1, cM0, G3, ER+, PR+, HER2-) - Unsigned    Malignant neoplasm of lower-inner quadrant of right breast of female, estrogen receptor positive ([place])  07/20/2019 Mammogram   Mammogram 07/20/19  IMPRESSION: Suspicious palpable right breast mass 3.4 x 2.5 x 3.4 cm 5 o'clock position 3 cm from nipple.   Suspicious 1.6cm cortically thickened right axillary lymph node.   07/26/2019 Initial Diagnosis   DIAGNOSIS: 07/26/19 A. RIGHT BREAST, 5:00, 3CMFN; ULTRASOUND-GUIDED NEEDLE CORE BIOPSY:  - INVASIVE MAMMARY CARCINOMA, NO SPECIAL TYPE.    07/26/2019 Receptors her2   BREAST BIOMARKER TESTS  Estrogen Receptor (ER) Status: POSITIVE                       Percentage of cells with nuclear positivity: 51-90%                       Average intensity of staining: Strong   Progesterone Receptor (PgR) Status: POSITIVE                       Percentage of cells with nuclear positivity:  Greater than 90%                       Average intensity of staining: Strong   HER2 (by immunohistochemistry): NEGATIVE (Score 1+)    08/05/2019 Initial Diagnosis   Invasive carcinoma of breast ([place])   08/17/2019 Surgery   INSERTION PORT-A-CATH by Dr. Lysle Pearl 08/17/19    08/18/2019 -  Chemotherapy   AC q2weeks for 4 cycles starting 08/18/19 followed by weekly Taxol for 12 weeks     Genetic  Testing   Negative genetic testing. No pathogenic variants identified on the [facility] Common Hereditary Cancers Panel. The report date is 11/13/2019.   The Common Hereditary Cancers Panel offered by [facility] includes sequencing and/or deletion duplication testing of the following 48 genes: APC, ATM, AXIN2, BARD1, BMPR1A, BRCA1, BRCA2, BRIP1, CDH1, CDKN2A (p14ARF), CDKN2A (p16INK4a), CKD4, CHEK2, CTNNA1, DICER1, EPCAM (Deletion/duplication testing only), GREM1 (promoter region deletion/duplication testing only), KIT, MEN1, MLH1, MSH2, MSH3, MSH6, MUTYH, NBN, NF1, NHTL1, PALB2, PDGFRA, PMS2, POLD1, POLE, PTEN, RAD50, RAD51C, RAD51D, RNF43, SDHB, SDHC, SDHD, SMAD4, SMARCA4. STK11, TP53, TSC1, TSC2, and VHL.  The following genes were evaluated for sequence changes only: SDHA and HOXB13 c.251G>A variant only.      CANCER THERAPY:  Neoadjuvant chemo ddAC starting 08/18/19 for 4 cycles followed by weekly Taxol x 12 Finished chemotherapy on 01/01/2020.   02/01/2020 s/p right lumpectomy and axillary lymph node dissection. ypT1c ypN1a(sn)  ER/PR positive, HER2 negative, residual invasive mammary carcinoma, grade 3.  1/3 sentinel lymph node positive and 2/10 right axillary lymph nodes positive.  DCIS present. Margin was negative for invasive carcinoma and DCIS   Finished adjuvant radiation on 04/30/2020  INTERVAL HISTORY:  Jessica [name] presents to follow-up for breast  cancer. Patient reports feeling well.  He finished radiation 1 week ago.  She feels fatigued. Otherwise no new complaints. Neuropathy has improved.  Patient is no longer taking gabapentin.  Review of Systems  Constitutional: Positive for fatigue. Negative for appetite change, chills and fever.  HENT:   Negative for hearing loss and voice change.   Eyes: Negative for eye problems.  Respiratory: Negative for chest tightness and cough.   Cardiovascular: Negative for chest pain.  Gastrointestinal: Negative for abdominal distention,  abdominal pain and blood in stool.  Endocrine: Negative for hot flashes.  Genitourinary: Negative for difficulty urinating and frequency.   Musculoskeletal: Negative for arthralgias.  Skin: Negative for itching and rash.  Neurological: Positive for numbness. Negative for extremity weakness.  Hematological: Negative for adenopathy.  Psychiatric/Behavioral: Negative for confusion and sleep disturbance.    MEDICAL HISTORY:  Past Medical History:  Diagnosis Date  . Anemia   . Arthritis    left ankle  . Breast cancer ([place]) 07/23/2019   right breast- [facility]  . Cancer [facility])    breast cancer  . Family history of cervical cancer   . Family history of lung cancer   . Frequency   . GERD (gastroesophageal reflux disease)   . History of kidney stones    h/o  . Obesity   . Personal history of chemotherapy   . Right flank pain     SURGICAL HISTORY: Past Surgical History:  Procedure Laterality Date  . AXILLARY LYMPH NODE DISSECTION Right 02/01/2020   Procedure: AXILLARY LYMPH NODE DISSECTION;  Surgeon: Benjamine Sprague, DO;  Location: [facility] ORS;  Service: General;  Laterality: Right;  . BREAST BIOPSY Right 2020   [facility]  . CESAREAN SECTION     x 2  . CHOLECYSTECTOMY    . DILATION AND CURETTAGE OF UTERUS    . LITHOTRIPSY    . PORTACATH PLACEMENT Left 08/17/2019   Procedure: INSERTION PORT-A-CATH;  Surgeon: Benjamine Sprague, DO;  Location: [facility] ORS;  Service: General;  Laterality: Left;  . TUBAL LIGATION      I have reviewed the social history and family history with the patient and they are unchanged from previous note.  ALLERGIES:  has No Known Allergies.  MEDICATIONS:  Current Outpatient Medications  Medication Sig Dispense Refill  . Dermatological Products, Misc. (STRATA XRT) GEL Apply topically.    Marland Kitchen ibuprofen (ADVIL) 800 MG tablet Take 1 tablet (800 mg total) by mouth every 8 (eight) hours as needed for mild pain or moderate pain. 30 tablet 0  . omeprazole (PRILOSEC) 20 MG capsule Take 1  capsule (20 mg total) by mouth daily as needed (acid reflux/indigestion.).    Marland Kitchen anastrozole (ARIMIDEX) 1 MG tablet Take 1 tablet (1 mg total) by mouth daily. 30 tablet 0  . ferrous sulfate 325 (65 FE) MG EC tablet TAKE 1 TABLET (325 MG TOTAL) BY MOUTH 2 (TWO) TIMES DAILY WITH A MEAL. (Patient not taking: Reported on 05/07/2020) 60 tablet 1  . gabapentin (NEURONTIN) 600 MG tablet Take 600 mg by mouth 3 (three) times daily. (Patient not taking: Reported on 05/07/2020)    . HYDROcodone-acetaminophen (NORCO) 5-325 MG tablet Take 1 tablet by mouth every 6 (six) hours as needed for up to 15 doses for moderate pain. (Patient not taking: Reported on 05/07/2020) 6 tablet 0   No current facility-administered medications for this visit.   Facility-Administered Medications Ordered in Other Visits  Medication Dose Route Frequency Provider Last Rate Last Admin  . sodium chloride flush (  NS) 0.9 % injection 10 mL  10 mL Intravenous Once Earlie Server, MD      . sodium chloride flush (NS) 0.9 % injection 10 mL  10 mL Intravenous PRN Earlie Server, MD   10 mL at 05/07/20 1010    PHYSICAL EXAMINATION: ECOG PERFORMANCE STATUS: 1 - Symptomatic but completely ambulatory  Vitals:   05/07/20 1027  BP: 120/85  Pulse: 80  Resp: 18  Temp: (!) 96.6 F (35.9 C)   Filed Weights   05/07/20 1027  Weight: 214 lb 9.6 oz (97.3 kg)   Physical Exam Constitutional:      General: She is not in acute distress.    Appearance: She is not diaphoretic.  HENT:     Head: Normocephalic and atraumatic.     Nose: Nose normal.     Mouth/Throat:     Pharynx: No oropharyngeal exudate.  Eyes:     General: No scleral icterus.    Pupils: Pupils are equal, round, and reactive to light.  Cardiovascular:     Rate and Rhythm: Normal rate and regular rhythm.     Heart sounds: No murmur heard.   Pulmonary:     Effort: Pulmonary effort is normal. No respiratory distress.     Breath sounds: No rales.  Chest:     Chest wall: No tenderness.    Abdominal:     General: There is no distension.     Palpations: Abdomen is soft.     Tenderness: There is no abdominal tenderness.  Musculoskeletal:        General: Normal range of motion.     Cervical back: Normal range of motion and neck supple.  Skin:    General: Skin is warm and dry.     Findings: No erythema.  Neurological:     Mental Status: She is alert and oriented to person, place, and time.     Cranial Nerves: No cranial nerve deficit.     Motor: No abnormal muscle tone.     Coordination: Coordination normal.  Psychiatric:        Mood and Affect: Affect normal.    Breast exam:  S/p right lumpectomy and right axillary dissection. no discharge at surgical site.   LABORATORY DATA:  I have reviewed the data as listed CBC Latest Ref Rng & Units 05/07/2020 04/03/2020 03/20/2020  WBC 4.0 - 10.5 K/uL 4.2 4.8 6.0  Hemoglobin 12.0 - 15.0 g/dL 12.5 12.1 12.1  Hematocrit 36 - 46 % 37.9 36.4 37.4  Platelets 150 - 400 K/uL 236 263 300     CMP Latest Ref Rng & Units 05/07/2020 03/20/2020 01/01/2020  Glucose 70 - 99 mg/dL 101(H) 142(H) 146(H)  BUN 6 - 20 mg/dL _0 Creatinine 0.44 - 1.00 mg/dL 0.63 0.78 0.74  Sodium 135 - 145 mmol/L 141 141 140  Potassium 3.5 - 5.1 mmol/L 3.8 3.4(L) 3.6  Chloride 98 - 111 mmol/L 104 103 106  CO2 22 - 32 mmol/L _1 Calcium 8.9 - 10.3 mg/dL 8.9 9.1 8.8(L)  Total Protein 6.5 - 8.1 g/dL 7.5 7.5 6.6  Total Bilirubin 0.3 - 1.2 mg/dL 0.6 0.7 0.5  Alkaline Phos 38 - 126 U/L 109 102 68  AST 15 - 41 U/L _2 ALT 0 - 44 U/L _3 RADIOGRAPHIC STUDIES: I have personally reviewed the radiological images as listed and agreed with the findings in the report. No results found.  ASSESSMENT & PLAN:  1. Malignant neoplasm of lower-inner quadrant of right breast of female, estrogen receptor positive ([place])   2. Neuropathy due to chemotherapeutic drug ([place])   3. Encounter for antineoplastic chemotherapy   4. Port-A-Cath in place      Invasive right breast cancer,  Cancer Staging Malignant neoplasm of lower-inner quadrant of right breast of female, estrogen receptor positive ([place]) Staging form: Breast, AJCC 8th Edition - Clinical stage from 07/26/2019: Stage IIB (cT2, cN1, cM0, G3, ER+, PR+, HER2-) - Unsigned -Status post 4 cycles of dose dense Adriamycin and Cytoxan, s/p weekly Taxol x 12.  Pathology ypT1c ypN1a(sn)  Finished adjuvant radiation. Patient is premenopausal. Discussed with patient about the rationale of ovarian suppression and aromatase inhibitor. Potential side effects were discussed with patient. Rationale of using aromatase inhibitor -Arimidex  discussed with patient.  Side effects of Arimidex including but not limited to hot flush, joint pain, fatigue, mood swing, osteoporosis discussed with patient. Patient voices understanding and willing to proceed.  Patient will proceed with Zoladex today.  And start with Arimidex 1 mg daily.  #We also discussed about the adjuvant bisphosphonate treatments.  Rationale and potential side effects were discussed.  We have received dental clearance.  I will obtain baseline DEXA.  Plan bisphosphonate at the next visit. Discussed about calcium and vitamin D supplementation.  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  Symptoms resolved.   ##Port-A-Cath in place, patient will need port flush every  8 weeks.  I discussed about keeping port for at least 1 year.  Follow-up 4 weeks   All questions were answered. The patient knows to call the clinic with any problems, questions or concerns. No barriers to learning was detected.     Earlie Server, MD 05/07/2020

## 2020-05-07 NOTE — Telephone Encounter (Signed)
Patient phoned on this date and stated that she would be out of town on 06-04-20 and rescheduled her appts for 06-06-20.

## 2020-05-07 NOTE — Telephone Encounter (Signed)
-----   Message from Earlie Server, MD sent at 05/07/2020  4:35 PM EDT ----- Please add TSH to her blood work.  Please also schedule her to have DEXA done.  She is aware.  I think I ordered the DEXA already.  Thank you

## 2020-05-08 NOTE — Telephone Encounter (Signed)
Done.. Pts DEXA scan has been scheduled as requested. I called pt and made her aware her the location, sched date and time of her 05/22/20 appt.

## 2020-05-10 ENCOUNTER — Other Ambulatory Visit: Payer: Self-pay | Admitting: *Deleted

## 2020-05-14 ENCOUNTER — Encounter: Payer: Self-pay | Admitting: *Deleted

## 2020-05-22 ENCOUNTER — Ambulatory Visit
Admission: RE | Admit: 2020-05-22 | Discharge: 2020-05-22 | Disposition: A | Discharge status: Home / Self Care | Payer: BC Managed Care – PPO | Source: Ambulatory Visit | Attending: Oncology | Admitting: Oncology

## 2020-05-22 DIAGNOSIS — Z170 Estrogen receptor positive status [ER+]: Secondary | ICD-10-CM | POA: Insufficient documentation

## 2020-05-22 DIAGNOSIS — C50311 Malignant neoplasm of lower-inner quadrant of right female breast: Secondary | ICD-10-CM

## 2020-05-28 ENCOUNTER — Telehealth: Payer: Self-pay

## 2020-05-28 DIAGNOSIS — C50311 Malignant neoplasm of lower-inner quadrant of right female breast: Secondary | ICD-10-CM

## 2020-05-28 DIAGNOSIS — Z170 Estrogen receptor positive status [ER+]: Secondary | ICD-10-CM

## 2020-05-28 NOTE — Telephone Encounter (Addendum)
Survivorship Care Plan visit completed.  Treatment summary reviewed and mailed to patient.  ASCO answers booklet reviewed and mailed to patient.  CARE program and Cancer Transitions discussed with patient along with other resources cancer center offers to patients and caregivers.  Patient verbalized understanding.  SCP packet mailed.  Patient wants to sign up for Cancer Transitions  Patient declined visit with  APP to have a Virtual visit to introduce her to the Survivorship Clinic.  Pt. States she has an $49 co-pay and wants to decline the visit at present but will reconsider if needed.  States she is seeing Dr. Tasia Catchings next week and will address any questions or concerns with her.    Encouraged patient to call for any questions or concerns.

## 2020-05-29 ENCOUNTER — Other Ambulatory Visit: Payer: Self-pay | Admitting: Oncology

## 2020-05-29 ENCOUNTER — Encounter: Payer: Self-pay | Admitting: *Deleted

## 2020-05-29 NOTE — Progress Notes (Signed)
Spoke to patient today.  She is status post radiation therapy.  States she doesn't have good range of motion, but Rosa has her scheduled to see Gwenette Greet the occupational therapist.  She is also experiencing joint pain from the AI's.  She has only been taking them for about 3 weeks.  If she is unable to tolerate the side effects after a month or 2, I encouraged her to discuss with Dr. Tasia Catchings.  She is agreeable.

## 2020-05-30 ENCOUNTER — Encounter: Payer: Self-pay | Admitting: Oncology

## 2020-06-03 NOTE — Telephone Encounter (Signed)
Dr. Tasia Catchings, patient is requesting intermittent FMLA due to bone pain, joint pain, and body aches that started with chemo.  These symptoms continue with starting Anastrozole.  Patient says that she has to climb steps to get to work and she hurts so bad she can't climb the stairs.  She also is requesting a handicap placard

## 2020-06-04 ENCOUNTER — Ambulatory Visit: Payer: BC Managed Care – PPO

## 2020-06-04 ENCOUNTER — Other Ambulatory Visit: Payer: BC Managed Care – PPO

## 2020-06-04 ENCOUNTER — Other Ambulatory Visit: Payer: Self-pay | Admitting: Oncology

## 2020-06-04 ENCOUNTER — Ambulatory Visit: Payer: BC Managed Care – PPO | Admitting: Oncology

## 2020-06-05 ENCOUNTER — Inpatient Hospital Stay: Payer: BC Managed Care – PPO | Admitting: Occupational Therapy

## 2020-06-06 ENCOUNTER — Inpatient Hospital Stay (HOSPITAL_BASED_OUTPATIENT_CLINIC_OR_DEPARTMENT_OTHER): Payer: BC Managed Care – PPO | Admitting: Oncology

## 2020-06-06 ENCOUNTER — Inpatient Hospital Stay: Payer: BC Managed Care – PPO | Attending: Oncology

## 2020-06-06 ENCOUNTER — Other Ambulatory Visit: Payer: Self-pay

## 2020-06-06 ENCOUNTER — Emergency Department: Payer: BC Managed Care – PPO

## 2020-06-06 ENCOUNTER — Encounter: Payer: Self-pay | Admitting: Emergency Medicine

## 2020-06-06 ENCOUNTER — Ambulatory Visit
Admission: RE | Admit: 2020-06-06 | Discharge: 2020-06-06 | Disposition: A | Discharge status: Home / Self Care | Payer: BC Managed Care – PPO | Source: Ambulatory Visit | Attending: Radiation Oncology | Admitting: Radiation Oncology

## 2020-06-06 ENCOUNTER — Inpatient Hospital Stay: Payer: BC Managed Care – PPO

## 2020-06-06 ENCOUNTER — Encounter: Payer: Self-pay | Admitting: Oncology

## 2020-06-06 VITALS — BP 116/79 | HR 70 | Resp 16

## 2020-06-06 VITALS — BP 114/81 | HR 76 | Temp 96.4°F | Resp 18 | Wt 215.1 lb

## 2020-06-06 DIAGNOSIS — C50311 Malignant neoplasm of lower-inner quadrant of right female breast: Secondary | ICD-10-CM

## 2020-06-06 DIAGNOSIS — E2839 Other primary ovarian failure: Secondary | ICD-10-CM

## 2020-06-06 DIAGNOSIS — T451X5A Adverse effect of antineoplastic and immunosuppressive drugs, initial encounter: Secondary | ICD-10-CM

## 2020-06-06 DIAGNOSIS — R0602 Shortness of breath: Secondary | ICD-10-CM | POA: Diagnosis not present

## 2020-06-06 DIAGNOSIS — M79605 Pain in left leg: Secondary | ICD-10-CM

## 2020-06-06 DIAGNOSIS — R519 Headache, unspecified: Secondary | ICD-10-CM

## 2020-06-06 DIAGNOSIS — Z79811 Long term (current) use of aromatase inhibitors: Secondary | ICD-10-CM | POA: Insufficient documentation

## 2020-06-06 DIAGNOSIS — R072 Precordial pain: Secondary | ICD-10-CM | POA: Insufficient documentation

## 2020-06-06 DIAGNOSIS — Z170 Estrogen receptor positive status [ER+]: Secondary | ICD-10-CM

## 2020-06-06 DIAGNOSIS — Z928 Personal history of other medical treatment: Secondary | ICD-10-CM | POA: Insufficient documentation

## 2020-06-06 DIAGNOSIS — M79604 Pain in right leg: Secondary | ICD-10-CM | POA: Diagnosis not present

## 2020-06-06 DIAGNOSIS — Z923 Personal history of irradiation: Secondary | ICD-10-CM | POA: Insufficient documentation

## 2020-06-06 DIAGNOSIS — Z9289 Personal history of other medical treatment: Secondary | ICD-10-CM | POA: Diagnosis not present

## 2020-06-06 DIAGNOSIS — Z95828 Presence of other vascular implants and grafts: Secondary | ICD-10-CM

## 2020-06-06 DIAGNOSIS — C50919 Malignant neoplasm of unspecified site of unspecified female breast: Secondary | ICD-10-CM

## 2020-06-06 DIAGNOSIS — C50911 Malignant neoplasm of unspecified site of right female breast: Secondary | ICD-10-CM

## 2020-06-06 DIAGNOSIS — G620 Drug-induced polyneuropathy: Secondary | ICD-10-CM

## 2020-06-06 LAB — CBC WITH DIFFERENTIAL/PLATELET
Abs Immature Granulocytes: 0 10*3/uL (ref 0.00–0.07)
Basophils Absolute: 0 10*3/uL (ref 0.0–0.1)
Basophils Relative: 1 %
Eosinophils Absolute: 0.3 10*3/uL (ref 0.0–0.5)
Eosinophils Relative: 6 %
HCT: 37.6 % (ref 36.0–46.0)
Hemoglobin: 12.7 g/dL (ref 12.0–15.0)
Immature Granulocytes: 0 %
Lymphocytes Relative: 29 %
Lymphs Abs: 1.3 10*3/uL (ref 0.7–4.0)
MCH: 28.2 pg (ref 26.0–34.0)
MCHC: 33.8 g/dL (ref 30.0–36.0)
MCV: 83.6 fL (ref 80.0–100.0)
Monocytes Absolute: 0.4 10*3/uL (ref 0.1–1.0)
Monocytes Relative: 9 %
Neutro Abs: 2.5 10*3/uL (ref 1.7–7.7)
Neutrophils Relative %: 55 %
Platelets: 250 10*3/uL (ref 150–400)
RBC: 4.5 MIL/uL (ref 3.87–5.11)
RDW: 13.2 % (ref 11.5–15.5)
WBC: 4.5 10*3/uL (ref 4.0–10.5)
nRBC: 0 % (ref 0.0–0.2)

## 2020-06-06 LAB — COMPREHENSIVE METABOLIC PANEL
ALT: 20 U/L (ref 0–44)
AST: 21 U/L (ref 15–41)
Albumin: 3.8 g/dL (ref 3.5–5.0)
Alkaline Phosphatase: 105 U/L (ref 38–126)
Anion gap: 9 (ref 5–15)
BUN: 9 mg/dL (ref 6–20)
CO2: 28 mmol/L (ref 22–32)
Calcium: 9 mg/dL (ref 8.9–10.3)
Chloride: 104 mmol/L (ref 98–111)
Creatinine, Ser: 0.69 mg/dL (ref 0.44–1.00)
GFR calc Af Amer: >60 mL/min (ref >60)
GFR calc non Af Amer: >60 mL/min (ref >60)
Glucose, Bld: 114 mg/dL — ABNORMAL HIGH (ref 70–99)
Potassium: 3.9 mmol/L (ref 3.5–5.1)
Sodium: 141 mmol/L (ref 135–145)
Total Bilirubin: 0.7 mg/dL (ref 0.3–1.2)
Total Protein: 7.4 g/dL (ref 6.5–8.1)

## 2020-06-06 LAB — TSH: TSH: 1.338 u[IU]/mL (ref 0.350–4.500)

## 2020-06-06 MED ORDER — HEPARIN SOD (PORK) LOCK FLUSH 100 UNIT/ML IV SOLN
INTRAVENOUS | Status: AC
  Filled 2020-06-06: qty 5

## 2020-06-06 MED ORDER — HEPARIN SOD (PORK) LOCK FLUSH 100 UNIT/ML IV SOLN
500.0000 [IU] | Freq: Once | INTRAVENOUS | Status: AC
  Administered 2020-06-06: 500 [IU] via INTRAVENOUS
  Filled 2020-06-06: qty 5

## 2020-06-06 MED ORDER — SODIUM CHLORIDE 0.9 % IV SOLN
Freq: Once | INTRAVENOUS | Status: AC
  Filled 2020-06-06: qty 250

## 2020-06-06 MED ORDER — ZOLEDRONIC ACID 4 MG/100ML IV SOLN
4.0000 mg | Freq: Once | INTRAVENOUS | Status: AC
  Administered 2020-06-06: 4 mg via INTRAVENOUS
  Filled 2020-06-06: qty 100

## 2020-06-06 MED ORDER — LIDOCAINE-PRILOCAINE 2.5-2.5 % EX CREA: 1.0000 "application " | TOPICAL_CREAM | CUTANEOUS | 2 refills | Status: DC | PRN

## 2020-06-06 MED ORDER — GOSERELIN ACETATE 3.6 MG ~~LOC~~ IMPL
3.6000 mg | DRUG_IMPLANT | Freq: Once | SUBCUTANEOUS | Status: AC
  Administered 2020-06-06: 3.6 mg via SUBCUTANEOUS
  Filled 2020-06-06: qty 3.6

## 2020-06-06 NOTE — Telephone Encounter (Signed)
Completed FMLA for 3 months has been faxed to [phone].

## 2020-06-06 NOTE — Progress Notes (Signed)
Radiation Oncology Follow up Note  Name: Jessica [name]   Date:   06/06/2020 MRN:  [id number] DOB: [birth year]    This 44 y.o. 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  REFERRING PROVIDER: Sharyne Peach, MD  HPI: Patient is a 44 year old female now at 1 month having completed whole breast radiation to her right breast for stage IIb invasive mammary carcinoma status post neoadjuvant chemotherapy followed by wide local excision sentinel node and axillary lymph node dissection. Seen today in routine follow-up she is doing well. She specifically denies breast tenderness cough or bone pain she is still having some difficulty with mobilization of her right shoulder she is seeking physical therapy for that.. She started Arimidex tolerating that well without side effects. Patient received Zoladex today.  COMPLICATIONS OF TREATMENT: none  FOLLOW UP COMPLIANCE: keeps appointments   PHYSICAL EXAM:  There were no vitals taken for this visit. Lungs are clear to A&P cardiac examination essentially unremarkable with regular rate and rhythm. No dominant mass or nodularity is noted in either breast in 2 positions examined. Incision is well-healed. No axillary or supraclavicular adenopathy is appreciated. Cosmetic result is excellent. Well-developed well-nourished patient in NAD. HEENT reveals PERLA, EOMI, discs not visualized.  Oral cavity is clear. No oral mucosal lesions are identified. Neck is clear without evidence of cervical or supraclavicular adenopathy. Lungs are clear to A&P. Cardiac examination is essentially unremarkable with regular rate and rhythm without murmur rub or thrill. Abdomen is benign with no organomegaly or masses noted. Motor sensory and DTR levels are equal and symmetric in the upper and lower  extremities. Cranial nerves II through XII are grossly intact. Proprioception is intact. No peripheral adenopathy or edema is identified. No motor or sensory levels are noted. Crude visual fields are within normal range.  RADIOLOGY RESULTS: No current films to review  PLAN: Present time patient is doing well 1 month out from whole breast radiation. And pleased with her overall progress. She continues with physical therapy for her slight limitation of motion in her right upper extremity. I have asked to see her back in 4 to 5 months for follow-up. She continues on Arimidex without side effect. Patient knows to call with any concerns.  I would like to take this opportunity to thank you for allowing me to participate in the care of your patient.Noreene Filbert, MD

## 2020-06-06 NOTE — Progress Notes (Signed)
Patient here for follow up. Pt reports having joint and bone pain ever since she had chemo. Denies neuropathy pain today. Refill for lidocaine cream sent to [facility], per pt request to refill.

## 2020-06-06 NOTE — Progress Notes (Signed)
[place]   [phone] [phone]   Clinic Follow up Note   Patient Care Team: Sharyne Peach, MD as PCP - General (Family Medicine) Earlie Server, MD as Consulting Physician (Oncology) Benjamine Sprague, DO as Consulting Physician (Surgery) Rico Junker, RN as Registered Nurse Noreene Filbert, MD as Radiation Oncologist (Radiation Oncology)  Date of Service:  06/06/2020  CHIEF COMPLAINT: F/u of right breast cancer   SUMMARY OF ONCOLOGIC HISTORY: Oncology History Overview Note  Cancer Staging Malignant neoplasm of lower-inner quadrant of right breast of female, estrogen receptor positive ([place]) Staging form: Breast, AJCC 8th Edition - Clinical stage from 07/26/2019: Stage IIB (cT2, cN1, cM0, G3, ER+, PR+, HER2-) - Unsigned    Malignant neoplasm of lower-inner quadrant of right breast of female, estrogen receptor positive ([place])  07/20/2019 Mammogram   Mammogram 07/20/19  IMPRESSION: Suspicious palpable right breast mass 3.4 x 2.5 x 3.4 cm 5 o'clock position 3 cm from nipple.   Suspicious 1.6cm cortically thickened right axillary lymph node.   07/26/2019 Initial Diagnosis   DIAGNOSIS: 07/26/19 A. RIGHT BREAST, 5:00, 3CMFN; ULTRASOUND-GUIDED NEEDLE CORE BIOPSY:  - INVASIVE MAMMARY CARCINOMA, NO SPECIAL TYPE.    07/26/2019 Receptors her2   BREAST BIOMARKER TESTS  Estrogen Receptor (ER) Status: POSITIVE                       Percentage of cells with nuclear positivity: 51-90%                       Average intensity of staining: Strong   Progesterone Receptor (PgR) Status: POSITIVE                       Percentage of cells with nuclear positivity:  Greater than 90%                       Average intensity of staining: Strong   HER2 (by immunohistochemistry): NEGATIVE (Score 1+)    08/05/2019 Initial Diagnosis   Invasive carcinoma of breast ([place])   08/17/2019 Surgery   INSERTION PORT-A-CATH by Dr. Lysle Pearl 08/17/19    08/18/2019 -  Chemotherapy     AC q2weeks for 4 cycles starting 08/18/19 followed by weekly Taxol for 12 weeks     Genetic Testing   Negative genetic testing. No pathogenic variants identified on the [facility] Common Hereditary Cancers Panel. The report date is 11/13/2019.   The Common Hereditary Cancers Panel offered by [facility] includes sequencing and/or deletion duplication testing of the following 48 genes: APC, ATM, AXIN2, BARD1, BMPR1A, BRCA1, BRCA2, BRIP1, CDH1, CDKN2A (p14ARF), CDKN2A (p16INK4a), CKD4, CHEK2, CTNNA1, DICER1, EPCAM (Deletion/duplication testing only), GREM1 (promoter region deletion/duplication testing only), KIT, MEN1, MLH1, MSH2, MSH3, MSH6, MUTYH, NBN, NF1, NHTL1, PALB2, PDGFRA, PMS2, POLD1, POLE, PTEN, RAD50, RAD51C, RAD51D, RNF43, SDHB, SDHC, SDHD, SMAD4, SMARCA4. STK11, TP53, TSC1, TSC2, and VHL.  The following genes were evaluated for sequence changes only: SDHA and HOXB13 c.251G>A variant only.      CANCER THERAPY:  Neoadjuvant chemo ddAC starting 08/18/19 for 4 cycles followed by weekly Taxol x 12 Finished chemotherapy on 01/01/2020.   02/01/2020 s/p right lumpectomy and axillary lymph node dissection. ypT1c ypN1a(sn)  ER/PR positive, HER2 negative, residual invasive mammary carcinoma, grade 3.  1/3 sentinel lymph node positive and 2/10 right axillary lymph nodes positive.  DCIS present. Margin was negative for invasive carcinoma and DCIS Finished adjuvant radiation on  04/30/2020 #July 2021 started ovarian suppression with Zoladex monthly, started Arimidex 1 mg daily.  INTERVAL HISTORY:  Jessica [name] presents to follow-up for breast cancer. Patient started ovarian suppression with Zoladex monthly and Arimidex 1 mg daily in July. Patient has called earlier requesting FLMA due to bone pain and stiffness which interfere her daily work activity. She reports that she has had bilateral lower extremity pain, left worse than right, since the start of chemotherapy.  The onset of pain is not  associated with the start of Zoladex and Arimidex. She has stopped the gabapentin for chronic left sciatic pain She also reports decreased range of motion of right shoulder. She denies any fingertip or toe numbness. She also has had headache for the past 2 weeks, she rated mild.  She takes over-the-counter Goody's (Tylenol and aspirin) with symptom relief. Review of Systems  Constitutional: Positive for fatigue. Negative for appetite change, chills and fever.  HENT:   Negative for hearing loss and voice change.   Eyes: Negative for eye problems.  Respiratory: Negative for chest tightness and cough.   Cardiovascular: Negative for chest pain.  Gastrointestinal: Negative for abdominal distention, abdominal pain and blood in stool.  Endocrine: Negative for hot flashes.  Genitourinary: Negative for difficulty urinating and frequency.   Musculoskeletal: Negative for arthralgias.       Bilateral lower extremity thigh pain, left worse than right.  Skin: Negative for itching and rash.  Neurological: Positive for headaches. Negative for extremity weakness and numbness.  Hematological: Negative for adenopathy.  Psychiatric/Behavioral: Negative for confusion and sleep disturbance.    MEDICAL HISTORY:  Past Medical History:  Diagnosis Date  . Anemia   . Arthritis    left ankle  . Breast cancer ([place]) 07/23/2019   right breast- [facility]  . Cancer [facility])    breast cancer  . Family history of cervical cancer   . Family history of lung cancer   . Frequency   . GERD (gastroesophageal reflux disease)   . History of kidney stones    h/o  . Obesity   . Personal history of chemotherapy   . Right flank pain     SURGICAL HISTORY: Past Surgical History:  Procedure Laterality Date  . AXILLARY LYMPH NODE DISSECTION Right 02/01/2020   Procedure: AXILLARY LYMPH NODE DISSECTION;  Surgeon: Benjamine Sprague, DO;  Location: [facility] ORS;  Service: General;  Laterality: Right;  . BREAST BIOPSY Right 2020   [facility]  .  CESAREAN SECTION     x 2  . CHOLECYSTECTOMY    . DILATION AND CURETTAGE OF UTERUS    . LITHOTRIPSY    . PORTACATH PLACEMENT Left 08/17/2019   Procedure: INSERTION PORT-A-CATH;  Surgeon: Benjamine Sprague, DO;  Location: [facility] ORS;  Service: General;  Laterality: Left;  . TUBAL LIGATION      I have reviewed the social history and family history with the patient and they are unchanged from previous note.  ALLERGIES:  has No Known Allergies.  MEDICATIONS:  Current Outpatient Medications  Medication Sig Dispense Refill  . anastrozole (ARIMIDEX) 1 MG tablet TAKE 1 TABLET BY MOUTH EVERY DAY 30 tablet 0  . ibuprofen (ADVIL) 800 MG tablet Take 1 tablet (800 mg total) by mouth every 8 (eight) hours as needed for mild pain or moderate pain. 30 tablet 0  . omeprazole (PRILOSEC) 20 MG capsule Take 1 capsule (20 mg total) by mouth daily as needed (acid reflux/indigestion.).    [facility] [employer] Dermatological Products, Misc. (STRATA XRT) GEL Apply  topically. (Patient not taking: Reported on 06/06/2020)    . ferrous sulfate 325 (65 FE) MG EC tablet TAKE 1 TABLET (325 MG TOTAL) BY MOUTH 2 (TWO) TIMES DAILY WITH A MEAL. (Patient not taking: Reported on 05/07/2020) 60 tablet 1  . gabapentin (NEURONTIN) 600 MG tablet Take 600 mg by mouth 3 (three) times daily. (Patient not taking: Reported on 05/07/2020)    . HYDROcodone-acetaminophen (NORCO) 5-325 MG tablet Take 1 tablet by mouth every 6 (six) hours as needed for up to 15 doses for moderate pain. (Patient not taking: Reported on 05/07/2020) 6 tablet 0  . lidocaine-prilocaine (EMLA) cream Apply 1 application topically as needed. Apply small amount to port site 1-2 hours prior to appointment. 30 g 2   No current facility-administered medications for this visit.   Facility-Administered Medications Ordered in Other Visits  Medication Dose Route Frequency Provider Last Rate Last Admin  . heparin lock flush 100 unit/mL  500 Units Intravenous Once Earlie Server, MD      . sodium chloride flush  (NS) 0.9 % injection 10 mL  10 mL Intravenous Once Earlie Server, MD      . Zoledronic Acid (ZOMETA) IVPB 4 mg  4 mg Intravenous Once Earlie Server, MD 400 mL/hr at 06/06/20 0959 4 mg at 06/06/20 0959    PHYSICAL EXAMINATION: ECOG PERFORMANCE STATUS: 1 - Symptomatic but completely ambulatory  Vitals:   06/06/20 0912  BP: 114/81  Pulse: 76  Resp: 18  Temp: (!) 96.4 F (35.8 C)   Filed Weights   06/06/20 0912  Weight: 215 lb 1.6 oz (97.6 kg)   Physical Exam Constitutional:      General: She is not in acute distress.    Appearance: She is not diaphoretic.  HENT:     Head: Normocephalic and atraumatic.     Nose: Nose normal.     Mouth/Throat:     Pharynx: No oropharyngeal exudate.  Eyes:     General: No scleral icterus.    Pupils: Pupils are equal, round, and reactive to light.  Cardiovascular:     Rate and Rhythm: Normal rate and regular rhythm.     Heart sounds: No murmur heard.   Pulmonary:     Effort: Pulmonary effort is normal. No respiratory distress.     Breath sounds: No rales.  Chest:     Chest wall: No tenderness.  Abdominal:     General: There is no distension.     Palpations: Abdomen is soft.     Tenderness: There is no abdominal tenderness.  Musculoskeletal:        General: Normal range of motion.     Cervical back: Normal range of motion and neck supple.  Skin:    General: Skin is warm and dry.     Findings: No erythema.  Neurological:     Mental Status: She is alert and oriented to person, place, and time.     Cranial Nerves: No cranial nerve deficit.     Motor: No abnormal muscle tone.     Coordination: Coordination normal.  Psychiatric:        Mood and Affect: Affect normal.      LABORATORY DATA:  I have reviewed the data as listed CBC Latest Ref Rng & Units 06/06/2020 05/07/2020 04/03/2020  WBC 4.0 - 10.5 K/uL 4.5 4.2 4.8  Hemoglobin 12.0 - 15.0 g/dL 12.7 12.5 12.1  Hematocrit 36 - 46 % 37.6 37.9 36.4  Platelets 150 - 400 K/uL 250 236 263  CMP  Latest Ref Rng & Units 06/06/2020 05/07/2020 03/20/2020  Glucose 70 - 99 mg/dL 114(H) 101(H) 142(H)  BUN 6 - 20 mg/dL '9 10 9  ' Creatinine 0.44 - 1.00 mg/dL 0.69 0.63 0.78  Sodium 135 - 145 mmol/L 141 141 141  Potassium 3.5 - 5.1 mmol/L 3.9 3.8 3.4(L)  Chloride 98 - 111 mmol/L 104 104 103  CO2 22 - 32 mmol/L '28 28 29  ' Calcium 8.9 - 10.3 mg/dL 9.0 8.9 9.1  Total Protein 6.5 - 8.1 g/dL 7.4 7.5 7.5  Total Bilirubin 0.3 - 1.2 mg/dL 0.7 0.6 0.7  Alkaline Phos 38 - 126 U/L 105 109 102  AST 15 - 41 U/L '21 20 21  ' ALT 0 - 44 U/L '20 20 18      ' RADIOGRAPHIC STUDIES: I have personally reviewed the radiological images as listed and agreed with the findings in the report. DG Bone Density  Result Date: 05/22/2020 EXAM: DUAL X-RAY ABSORPTIOMETRY (DXA) FOR BONE MINERAL DENSITY IMPRESSION: Your patient Jessica [name] completed a BMD test on 05/22/2020 using the So-Hi (software version: 14.10) manufactured by [employer]. The following summarizes the results of our evaluation. Technologist: SCE PATIENT BIOGRAPHICAL: Name: Jessica [name], Jessica [name] Patient ID: [id number] Birth Date: 20-Aug-1976 Height: 61.5 in. Gender: Female Exam Date: 05/22/2020 Weight: 212.0 lbs. Indications: High Risk Meds, History of Breast Cancer, Postmenopausal, Previous Chemo and Radiation Fractures: Treatments: Anastrozole, Omeprazole DENSITOMETRY RESULTS: Site      Region      Measured Date Measured Age WHO Classification Young Adult T-score BMD         %Change vs. Previous Significant Change (*) AP Spine L1-L4 05/22/2020 46.0 Normal 1.0 1.322 g/cm2 - - DualFemur Total Right 05/22/2020 46.0 Normal -0.9 0.897 g/cm2 - - ASSESSMENT: The BMD measured at Femur Total Right is 0.897 g/cm2 with a T-score of -0.9. This patient is considered normal according to [facility]) criteria. The scan quality is good. World Pharmacologist [facility]) criteria for post-menopausal, Caucasian Women: Normal:                    T-score at or above -1 SD Osteopenia/low bone mass: T-score between -1 and -2.5 SD Osteoporosis:             T-score at or below -2.5 SD RECOMMENDATIONS: 1. All patients should optimize calcium and vitamin D intake. 2. Consider FDA-approved medical therapies in postmenopausal women and men aged 38 years and older, based on the following: a. A hip or vertebral(clinical or morphometric) fracture b. T-score < -2.5 at the femoral neck or spine after appropriate evaluation to exclude secondary causes c. Low bone mass (T-score between -1.0 and -2.5 at the femoral neck or spine) and a 10-year probability of a hip fracture > 3% or a 10-year probability of a major osteoporosis-related fracture > 20% based on the [place]-adapted WHO algorithm 3. Clinician judgment and/or patient preferences may indicate treatment for people with 10-year fracture probabilities above or below these levels FOLLOW-UP: People with diagnosed cases of osteoporosis or at high risk for fracture should have regular bone mineral density tests. For patients eligible for Medicare, routine testing is allowed once every 2 years. The testing frequency can be increased to one year for patients who have rapidly progressing disease, those who are receiving or discontinuing medical therapy to restore bone mass, or have additional risk factors. I have reviewed this report, and agree with the above findings. [facility] Electronically  Signed   By: Nolon Nations M.D.   On: 05/22/2020 19:06     ASSESSMENT & PLAN:  1. Malignant neoplasm of lower-inner quadrant of right breast of female, estrogen receptor positive ([place])   2. Suppression of ovarian secretion   3. Aromatase inhibitor use   4. Leg pain, bilateral   5. Nonintractable episodic headache, unspecified headache type     Invasive right breast cancer,  Cancer Staging Malignant neoplasm of lower-inner quadrant of right breast of female, estrogen receptor positive ([place]) Staging form:  Breast, AJCC 8th Edition - Clinical stage from 07/26/2019: Stage IIB (cT2, cN1, cM0, G3, ER+, PR+, HER2-) - Unsigned -Status post 4 cycles of dose dense Adriamycin and Cytoxan, s/p weekly Taxol x 12.  Pathology ypT1c ypN1a(sn)  Finished adjuvant radiation. Patient is premenopausal. Discussed with patient about the rationale of ovarian suppression and aromatase inhibitor. Potential side effects were discussed with patient. Rationale of using aromatase inhibitor -Arimidex  discussed with patient.  Side effects of Arimidex including but not limited to hot flush, joint pain, fatigue, mood swing, osteoporosis discussed with patient. Patient voices understanding and willing to proceed.  Patient will proceed with Zoladex today.  And start with Arimidex 1 mg daily.  #baseline DEXA showed normal bone density. Dental clearance received. Proceed with Zometa today. Plan adjuvant Zometa every 6 months for 2-3 years. . Recommend her to continue take calcium and vitamin D supplementation.  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  # pre-existing sciatica, I recommend her to resume gabapentin 320m BID.  # Bilateral lower extremity pain, stiffness, onset of symptoms - reports that onset is "since the start of chemotherapy", not related to the start of ovarian suppression and AI.  She feels that her current symptoms are interfering her work activity. 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 She requests to have FLMA paper work filled for intermittent leave. I signed FLMA paper work for 1 day per week leave for 3 months.  Refer to physical therapy, weight loss, exercise, if symptoms persist, consider bone scan.   # Headache, mild, PRN tylenol.   ##Port-A-Cath in place, patient will need port flush every  8 weeks.  I discussed about keeping port for at least 1 year.  Follow-up monthly Zoledex, follow up in 3 months.   All questions were answered. The  patient knows to call the clinic with any problems, questions or concerns. No barriers to learning was detected.     ZEarlie Server MD 06/06/2020

## 2020-06-06 NOTE — ED Triage Notes (Signed)
Pt received Zometa infusion today at cancer center (first time.) Pt went home mowed grass and then started to have mid sternal chest pain and SOB. Pt describes pain as sharp, non radiating. ((Nutropenic precautions))

## 2020-06-07 ENCOUNTER — Emergency Department
Admission: EM | Admit: 2020-06-07 | Discharge: 2020-06-07 | Disposition: A | Discharge status: Home / Self Care | Payer: BC Managed Care – PPO | Attending: Emergency Medicine | Admitting: Emergency Medicine

## 2020-06-07 ENCOUNTER — Encounter: Payer: Self-pay | Admitting: Oncology

## 2020-06-07 LAB — CBC
HCT: 36.4 % (ref 36.0–46.0)
Hemoglobin: 12.5 g/dL (ref 12.0–15.0)
MCH: 28.7 pg (ref 26.0–34.0)
MCHC: 34.3 g/dL (ref 30.0–36.0)
MCV: 83.7 fL (ref 80.0–100.0)
Platelets: 257 10*3/uL (ref 150–400)
RBC: 4.35 MIL/uL (ref 3.87–5.11)
RDW: 13.4 % (ref 11.5–15.5)
WBC: 6.7 10*3/uL (ref 4.0–10.5)
nRBC: 0 % (ref 0.0–0.2)

## 2020-06-07 LAB — BASIC METABOLIC PANEL
Anion gap: 10 (ref 5–15)
BUN: 13 mg/dL (ref 6–20)
CO2: 28 mmol/L (ref 22–32)
Calcium: 8.9 mg/dL (ref 8.9–10.3)
Chloride: 103 mmol/L (ref 98–111)
Creatinine, Ser: 0.78 mg/dL (ref 0.44–1.00)
GFR calc Af Amer: >60 mL/min (ref >60)
GFR calc non Af Amer: >60 mL/min (ref >60)
Glucose, Bld: 102 mg/dL — ABNORMAL HIGH (ref 70–99)
Potassium: 3.7 mmol/L (ref 3.5–5.1)
Sodium: 141 mmol/L (ref 135–145)

## 2020-06-07 LAB — TROPONIN I (HIGH SENSITIVITY): Troponin I (High Sensitivity): 4 ng/L (ref <18)

## 2020-06-07 LAB — CANCER ANTIGEN 27.29: CA 27.29: 25.3 U/mL (ref 0.0–38.6)

## 2020-06-12 ENCOUNTER — Telehealth: Payer: Self-pay

## 2020-06-12 ENCOUNTER — Other Ambulatory Visit: Payer: Self-pay

## 2020-06-12 ENCOUNTER — Inpatient Hospital Stay: Payer: BC Managed Care – PPO | Admitting: Occupational Therapy

## 2020-06-12 DIAGNOSIS — M25611 Stiffness of right shoulder, not elsewhere classified: Secondary | ICD-10-CM

## 2020-06-12 DIAGNOSIS — I972 Postmastectomy lymphedema syndrome: Secondary | ICD-10-CM

## 2020-06-12 NOTE — Therapy (Signed)
[facility] [address] [place], [place], [place], [address] Phone: [phone]   Fax:  [phone]  Occupational Therapy Screen  Patient Details  Name: Jessica [name] MRN: [id number] Date of Birth: 1976/09/02 No data recorded  Encounter Date: 06/12/2020   OT End of Session - 06/12/20 0944    Visit Number 0           Past Medical History:  Diagnosis Date  . Anemia   . Arthritis    left ankle  . Breast cancer ([place]) 07/23/2019   right breast- [facility]  . Cancer [facility])    breast cancer  . Family history of cervical cancer   . Family history of lung cancer   . Frequency   . GERD (gastroesophageal reflux disease)   . History of kidney stones    h/o  . Obesity   . Personal history of chemotherapy   . Right flank pain     Past Surgical History:  Procedure Laterality Date  . AXILLARY LYMPH NODE DISSECTION Right 02/01/2020   Procedure: AXILLARY LYMPH NODE DISSECTION;  Surgeon: Benjamine Sprague, DO;  Location: [facility] ORS;  Service: General;  Laterality: Right;  . BREAST BIOPSY Right 2020   [facility]  . CESAREAN SECTION     x 2  . CHOLECYSTECTOMY    . DILATION AND CURETTAGE OF UTERUS    . LITHOTRIPSY    . PORTACATH PLACEMENT Left 08/17/2019   Procedure: INSERTION PORT-A-CATH;  Surgeon: Benjamine Sprague, DO;  Location: [facility] ORS;  Service: General;  Laterality: Left;  . TUBAL LIGATION    LAST HISTORY PER DR YU NOTE: Neoadjuvant chemo ddAC starting 08/18/19 for 4 cycles followed by weekly Taxolx 12 Finished chemotherapy on 01/01/2020.   02/01/2020 s/p right lumpectomy and axillary lymph node dissection. ypT1c ypN1a(sn)  ER/PR positive, HER2 negative, residual invasive mammary carcinoma, grade 3.  1/3 sentinel lymph node positive and 2/10 right axillary lymph nodes positive.  DCIS present. Margin was negative for invasive carcinoma and DCIS   Finished adjuvant radiation on 04/30/2020   OT VISIT: There were no vitals filed for  this visit.   Subjective Assessment - 06/12/20 0937    Subjective  I did not know anything of lymphedema - or therapy for my shoulder -it is tight under my arm when I try and reach over head with some pain -arm feels funny at times -neuropathy is better    Currently in Pain? Yes    Pain Score 1     Pain Location Shoulder    Pain Orientation Right    Pain Descriptors / Indicators Tightness    Pain Type Acute pain;Surgical pain               LYMPHEDEMA/ONCOLOGY QUESTIONNAIRE - 06/12/20 0001      Surgeries   Lumpectomy Date 02/21/20    Number Lymph Nodes Removed 13      Date Lymphedema/Swelling Started   Date --   unclear     Treatment   Past Chemotherapy Treatment Yes    Date 01/01/20    Past Radiation Treatment Yes    Date 04/30/20    Current Hormone Treatment Yes      What other symptoms do you have   Are you Having Heaviness or Tightness Yes    Are you having Pain Yes    Are you having pitting edema No    Is it Hard or Difficult finding clothes that fit No  Do you have infections No    Other Symptoms --   stiffness and tightness under the R arm     Lymphedema Stage   Stage STAGE 2 SPONTANEOUSLY IRREVERSIBLE      Right Upper Extremity Lymphedema   15 cm Proximal to Olecranon Process 38.4 cm    10 cm Proximal to Olecranon Process 37 cm    Olecranon Process 29.3 cm    15 cm Proximal to Ulnar Styloid Process 28 cm    10 cm Proximal to Ulnar Styloid Process 24.2 cm    Just Proximal to Ulnar Styloid Process 18 cm    Across Hand at [employer] 20.5 cm    At [place] of 2nd Digit 6.5 cm    At [facility] of Thumb 6.5 cm      Left Upper Extremity Lymphedema   15 cm Proximal to Olecranon Process 37.5 cm    10 cm Proximal to Olecranon Process 36 cm    Olecranon Process 28.5 cm    15 cm Proximal to Ulnar Styloid Process 28.3 cm    10 cm Proximal to Ulnar Styloid Process 24 cm    Just Proximal to Ulnar Styloid Process 17.5 cm    Across Hand at [employer] 20 cm     At [place] of 2nd Digit 6.5 cm    At [facility] of Thumb 6.4 cm                Pt refer by Dr Tasia Catchings for R UE stiffness and lymphedema signs and education. 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 Ride motorcycle in free time, work in yard, swimming at [employer] and is L hand dominant. AROM for shoulders :  Flexion R 130, L 145 ABD R 125, L 145 Ext bilateral WNL  External rotation WFL -but tightness and feel pull over pect   Pt's R UE circumference increase - although pt is L hand dominant Stage 1 to stage 2 lymphedema?  Did not show this date thoracic lymphedema - wearing good sport bra with wide part under arm Pt can benefit from over the counter compression sleeve and glove to wear for week or 2 and will reassess.  Pt ed on HEP for AAROM for shoulder flexion and ABD on wall -and ext rotation stretch in corner 10 reps hold 5 sec - slight pull  And lateral cervical stretch L for upper traps in shower Would recommend OT eval and tx for R UE stiffness and lymphedema signs                      Patient will benefit from skilled therapeutic intervention in order to improve the following deficits and impairments:           Visit Diagnosis: Stiffness of right shoulder, not elsewhere classified  Postmastectomy lymphedema syndrome    Problem List Patient Active Problem List   Diagnosis Date Noted  . Aromatase inhibitor use 06/06/2020  . Neuropathy due to chemotherapeutic drug ([place]) 05/07/2020  . Port-A-Cath in place 05/07/2020  . Suppression of ovarian secretion 05/07/2020  . Kidney stone 02/23/2020  . B12 deficiency 01/12/2020  . Mixed stress and urge urinary incontinence 01/12/2020  . Genetic testing 11/14/2019  . Family history of cervical cancer   . Family history of lung cancer   . Goals of care, counseling/discussion 08/26/2019  . Iron deficiency 08/26/2019  . Malignant neoplasm of lower-inner  quadrant  of right breast of female, estrogen receptor positive ([place]) 08/05/2019  . Bursitis of left hip 08/04/2019  . Sciatica 08/04/2019  . Breast cancer metastasized to axillary lymph node, right ([place]) 07/31/2019  . Chronic midline low back pain without sciatica 04/18/2018  . Lumbar spondylosis 04/18/2018  . Scoliosis of thoracolumbar spine 04/01/2017  . Plantar fasciitis 03/06/2017    Rosalyn Gess OTR/L,CLT 06/12/2020, 9:45 AM  [facility] [address] [place], [place], [place], [address] Phone: [phone]   Fax:  [phone]  Name: Jessica [name] MRN: [id number] Date of Birth: Nov 30, 1975

## 2020-06-12 NOTE — Telephone Encounter (Signed)
[place] message received from Rosalyn Gess, OT:  Dr Tasia Catchings can we send order to [place] for : over the counter compression sleeve and glove for her to wear R UE lymphedema- pt is L hand dominant -but R UE were increase about 1-2 cm in elbow and upper arm - hope to move her to wear only with high risk activities down the road- AND also OT order to eval and tx for R shoulder stiffness - OT order to be send to : [facility] sports attention Gwenette Greet   Dr Tasia Catchings approves.

## 2020-06-12 NOTE — Telephone Encounter (Signed)
Order faxed to [facility] and order entered for OT

## 2020-06-21 ENCOUNTER — Other Ambulatory Visit: Payer: Self-pay | Admitting: Oncology

## 2020-06-24 ENCOUNTER — Ambulatory Visit: Payer: BC Managed Care – PPO | Attending: Oncology | Admitting: Occupational Therapy

## 2020-06-24 ENCOUNTER — Encounter: Payer: Self-pay | Admitting: Occupational Therapy

## 2020-06-24 ENCOUNTER — Other Ambulatory Visit: Payer: Self-pay

## 2020-06-24 DIAGNOSIS — M25611 Stiffness of right shoulder, not elsewhere classified: Secondary | ICD-10-CM | POA: Diagnosis not present

## 2020-06-24 DIAGNOSIS — I890 Lymphedema, not elsewhere classified: Secondary | ICD-10-CM | POA: Insufficient documentation

## 2020-06-24 DIAGNOSIS — L905 Scar conditions and fibrosis of skin: Secondary | ICD-10-CM | POA: Diagnosis present

## 2020-06-24 NOTE — Therapy (Signed)
[facility] [address] [place], [address] Phone: [phone]   Fax:  [phone]  Occupational Therapy Evaluation  Patient Details  Name: Jessica [name] MRN: [id number] Date of Birth: Mar 01, 1976 Referring Provider (OT): Dr Tasia Catchings   Encounter Date: 06/24/2020   OT End of Session - 06/24/20 0851    Visit Number 1    Number of Visits 6    Date for OT Re-Evaluation 08/05/20    OT Start Time 0730    OT Stop Time 0811    OT Time Calculation (min) 41 min    Activity Tolerance Patient tolerated treatment well    Behavior During Therapy [facility] for tasks assessed/performed           Past Medical History:  Diagnosis Date  . Anemia   . Arthritis    left ankle  . Breast cancer ([place]) 07/23/2019   right breast- [facility]  . Cancer [facility])    breast cancer  . Family history of cervical cancer   . Family history of lung cancer   . Frequency   . GERD (gastroesophageal reflux disease)   . History of kidney stones    h/o  . Obesity   . Personal history of chemotherapy   . Right flank pain     Past Surgical History:  Procedure Laterality Date  . AXILLARY LYMPH NODE DISSECTION Right 02/01/2020   Procedure: AXILLARY LYMPH NODE DISSECTION;  Surgeon: Benjamine Sprague, DO;  Location: [facility] ORS;  Service: General;  Laterality: Right;  . BREAST BIOPSY Right 2020   [facility]  . CESAREAN SECTION     x 2  . CHOLECYSTECTOMY    . DILATION AND CURETTAGE OF UTERUS    . LITHOTRIPSY    . PORTACATH PLACEMENT Left 08/17/2019   Procedure: INSERTION PORT-A-CATH;  Surgeon: Benjamine Sprague, DO;  Location: [facility] ORS;  Service: General;  Laterality: Left;  . TUBAL LIGATION      There were no vitals filed for this visit.   Subjective Assessment - 06/24/20 0828    Subjective  My R arm it tight underneath when I reach over head with some pain  - and arm feels funny , full and heavy at times - the sleeve helps for that feeling - I wore it for the last  week and 1/2 during day    Pertinent History Neoadjuvant chemo ddAC starting 08/18/19 for 4 cycles followed by weekly Taxol x 12Finished chemotherapy on 01/01/2020.  02/01/2020 s/p right lumpectomy and axillary lymph node dissection. ypT1c ypN1a(sn) ER/PR positive, HER2 negative, residual invasive mammary carcinoma, grade 3. 1/3 sentinel lymph node positive and 2/10 right axillary lymph nodes positive. DCIS present. Margin was negative for invasive carcinoma and DCIS  Finished adjuvant radiation on 04/30/2020    Patient Stated Goals I want to get the swelling in my arm better and the my shoulder range to ride my motorcycle, work in yard    Currently in Pain? Yes    Pain Score 1     Pain Location Shoulder    Pain Orientation Right    Pain Descriptors / Indicators Tightness    Pain Type Acute pain;Surgical pain    Pain Onset More than a month ago              [facility] OT Assessment - 06/24/20 0001      Assessment   Medical Diagnosis R UE lymphedema, shoulder stiffness post breast CA    Referring Provider (OT)  Dr Tasia Catchings    Onset Date/Surgical Date 02/01/20    Hand Dominance Left    Next MD Visit --   Oct    Prior Therapy --   Seen for OT screen 2 wks ago     Precautions   Precaution Comments --   R UE lymphedema     Home  Environment   Lives With Family      Prior Function   Vocation Full time employment    Leisure Admin in [employer] system, ride motorcycle, swim [employer] , yard work       AROM   Right Shoulder Extension 60 Degrees    Right Shoulder Flexion 130 Degrees    Right Shoulder ABduction 135 Degrees    Left Shoulder Extension 60 Degrees    Left Shoulder Flexion 145 Degrees    Left Shoulder ABduction 145 Degrees           LYMPHEDEMA/ONCOLOGY QUESTIONNAIRE - 06/24/20 0001      Right Upper Extremity Lymphedema   15 cm Proximal to Olecranon Process 38 cm    10 cm Proximal to Olecranon Process 36 cm    Olecranon Process 29.5 cm    15 cm Proximal to Ulnar Styloid  Process 28 cm    10 cm Proximal to Ulnar Styloid Process 24.8 cm    Just Proximal to Ulnar Styloid Process 18.5 cm    Across Hand at [employer] 20.8 cm    At [place] of 2nd Digit 6.6 cm    At [facility] of Thumb 6.6 cm            Did not show thoracic lymphedema - wearing good sport bra with wide part under arm Wearing medi harmony over the compression sleeve and gauntlet - but pt to wear a glove - because of distal forearm to hand increase this date  Also fitted with isotoner glove to wear night time with tubigrip D on hand to elbow  Pt bilateral shoulder AROM decrease  AAROM for shoulder flexion and ABD on wall And ext rotation stretch in corner  10 reps hold 5 sec - slight pull  And lateral cervical stretch L for upper traps in shower Add this date AROM using golf club or something for shoulder flexion and shoulder ABD in supine  10reps   MLD done AAA from R to L , and R AI  20 reps Upper arm from elbow to shoulder 10-20 reps in supine  1 x day                 OT Education - 06/24/20 0850    Education Details findings of eval and HEP for ROM , MLD, compression wearing    Person(s) Educated Patient    Methods Explanation;Demonstration;Tactile cues;Verbal cues;Handout    Comprehension Verbal cues required;Returned demonstration;Verbalized understanding            OT Short Term Goals - 06/24/20 0857      OT SHORT TERM GOAL #1   Title Pt's R shoulder AROM increase to WNL to pull sweater over head, reach into cabinets without any symptoms    Baseline decrease R shoulder flexion , abd - pull and tightness under arm , pain 1/10 in shoulder    Time 4    Period Weeks    Status New    Target Date 07/22/20             OT Long Term Goals - 06/24/20 3295  OT LONG TERM GOAL #1   Title R UE circumference decrease by  1/2 to 1 cm from forearm to upper arm to be in range compare to L dominant UE to maintain with correct compression homeprogram    Baseline R UE  increase see flowsheet- fitted with Medi harmony and gauntlet - pt to wear glove -and during day    Time 6    Period Weeks    Status New    Target Date 08/05/20      OT LONG TERM GOAL #2   Title Pt to be independent in wearing correct compression garments to prevent issues with lymphedema and maintain in stage 1    Baseline stage 1 moving into stage 2 lymphedema- did not wear any compression prior to 2 wks ago - and with increase use - arm feel swollen , increase heaviness and tight    Time 6    Period Weeks    Status New    Target Date 08/05/20      OT LONG TERM GOAL #3   Title Pt to show increase strength in R UE to 5/5 to return to swimming, motor cycle and yard work    Baseline decrease AROM in flexion and ABD - 130 and 135 - tightness under arm , stiffness    Time 6    Period Weeks    Status New    Target Date 08/05/20                 Plan - 06/24/20 5638    Clinical Impression Statement Pt present at OT eval this date with history of R breast CA - Neoadjuvant chemo ddAC starting 08/18/19 for 4 cycles followed by weekly Taxol x 12Finished chemotherapy on 01/01/2020.  02/01/2020 s/p right lumpectomy and axillary lymph node dissection. ypT1c ypN1a(sn) ER/PR positive, HER2 negative, residual invasive mammary carcinoma, grade 3. 1/3 sentinel lymph node positive and 2/10 right axillary lymph nodes positive. Margin was negative for invasive carcinoma and DCIS  Finished adjuvant radiation on 04/30/2020 -Pt refer to R shoulder stiffness and lymphedema of R UE - pt show increase of circumference compare to L dominant UE - and decrease shoulder AROM and strength - limiting pt use in ADL's and IADL's.    Occupational performance deficits (Please refer to evaluation for details): ADL's;IADL's;Work;Play;Leisure    Body Structure / Function / Physical Skills ADL;Flexibility;Decreased knowledge of precautions;ROM;UE functional use;Scar mobility;Edema;Pain;Strength;IADL    Rehab Potential Good     Clinical Decision Making Limited treatment options, no task modification necessary    Comorbidities Affecting Occupational Performance: None    Modification or Assistance to Complete Evaluation  No modification of tasks or assist necessary to complete eval    OT Frequency 1x / week    OT Duration 6 weeks    OT Treatment/Interventions Self-care/ADL training;Therapeutic exercise;Patient/family education;Scar mobilization;Manual Therapy;Passive range of motion    Plan Assess circumference decrease with wearing of daytime compression , and increase R shoulder AROM ?    Consulted and Agree with Plan of Care Patient           Patient will benefit from skilled therapeutic intervention in order to improve the following deficits and impairments:   Body Structure / Function / Physical Skills: ADL, Flexibility, Decreased knowledge of precautions, ROM, UE functional use, Scar mobility, Edema, Pain, Strength, IADL       Visit Diagnosis: Stiffness of right shoulder, not elsewhere classified - Plan: Ot plan of care cert/re-cert  Scar condition and  fibrosis of skin - Plan: Ot plan of care cert/re-cert  Lymphedema, not elsewhere classified - Plan: Ot plan of care cert/re-cert    Problem List Patient Active Problem List   Diagnosis Date Noted  . Aromatase inhibitor use 06/06/2020  . Neuropathy due to chemotherapeutic drug ([place]) 05/07/2020  . Port-A-Cath in place 05/07/2020  . Suppression of ovarian secretion 05/07/2020  . Kidney stone 02/23/2020  . B12 deficiency 01/12/2020  . Mixed stress and urge urinary incontinence 01/12/2020  . Genetic testing 11/14/2019  . Family history of cervical cancer   . Family history of lung cancer   . Goals of care, counseling/discussion 08/26/2019  . Iron deficiency 08/26/2019  . Malignant neoplasm of lower-inner quadrant of right breast of female, estrogen receptor positive ([place]) 08/05/2019  . Bursitis of left hip 08/04/2019  . Sciatica 08/04/2019  .  Breast cancer metastasized to axillary lymph node, right ([place]) 07/31/2019  . Chronic midline low back pain without sciatica 04/18/2018  . Lumbar spondylosis 04/18/2018  . Scoliosis of thoracolumbar spine 04/01/2017  . Plantar fasciitis 03/06/2017    Rosalyn Gess OTR/l,CLT 06/24/2020, 10:04 AM  [facility] [address], [place], [address] Phone: [phone]   Fax:  [phone]  Name: Jessica [name] MRN: [id number] Date of Birth: 02-15-1976

## 2020-07-01 ENCOUNTER — Ambulatory Visit: Payer: BC Managed Care – PPO | Admitting: Occupational Therapy

## 2020-07-01 ENCOUNTER — Other Ambulatory Visit: Payer: Self-pay

## 2020-07-01 DIAGNOSIS — M25611 Stiffness of right shoulder, not elsewhere classified: Secondary | ICD-10-CM

## 2020-07-01 DIAGNOSIS — L905 Scar conditions and fibrosis of skin: Secondary | ICD-10-CM

## 2020-07-01 DIAGNOSIS — I890 Lymphedema, not elsewhere classified: Secondary | ICD-10-CM

## 2020-07-01 NOTE — Therapy (Signed)
[facility] [address], [place], [address] Phone: [phone]   Fax:  [phone]  Occupational Therapy Treatment  Patient Details  Name: Jessica [name] MRN: [id number] Date of Birth: 03-Aug-1976 Referring Provider (OT): Dr Tasia Catchings   Encounter Date: 07/01/2020   OT End of Session - 07/01/20 0940    Visit Number 2    Number of Visits 6    Date for OT Re-Evaluation 08/05/20    OT Start Time 0738    OT Stop Time 0830    OT Time Calculation (min) 52 min    Activity Tolerance Patient tolerated treatment well    Behavior During Therapy [facility] for tasks assessed/performed           Past Medical History:  Diagnosis Date  . Anemia   . Arthritis    left ankle  . Breast cancer ([place]) 07/23/2019   right breast- [facility]  . Cancer [facility])    breast cancer  . Family history of cervical cancer   . Family history of lung cancer   . Frequency   . GERD (gastroesophageal reflux disease)   . History of kidney stones    h/o  . Obesity   . Personal history of chemotherapy   . Right flank pain     Past Surgical History:  Procedure Laterality Date  . AXILLARY LYMPH NODE DISSECTION Right 02/01/2020   Procedure: AXILLARY LYMPH NODE DISSECTION;  Surgeon: Benjamine Sprague, DO;  Location: [facility] ORS;  Service: General;  Laterality: Right;  . BREAST BIOPSY Right 2020   [facility]  . CESAREAN SECTION     x 2  . CHOLECYSTECTOMY    . DILATION AND CURETTAGE OF UTERUS    . LITHOTRIPSY    . PORTACATH PLACEMENT Left 08/17/2019   Procedure: INSERTION PORT-A-CATH;  Surgeon: Benjamine Sprague, DO;  Location: [facility] ORS;  Service: General;  Laterality: Left;  . TUBAL LIGATION      There were no vitals filed for this visit.   Subjective Assessment - 07/01/20 0939    Subjective  Doing okay - can tell it is getting better - I can sleep on that side now , my arm not as heavy - using my arm more and reaching without thinking    Pertinent History Neoadjuvant  chemo ddAC starting 08/18/19 for 4 cycles followed by weekly Taxol x 12Finished chemotherapy on 01/01/2020.  02/01/2020 s/p right lumpectomy and axillary lymph node dissection. ypT1c ypN1a(sn) ER/PR positive, HER2 negative, residual invasive mammary carcinoma, grade 3. 1/3 sentinel lymph node positive and 2/10 right axillary lymph nodes positive. DCIS present. Margin was negative for invasive carcinoma and DCIS  Finished adjuvant radiation on 04/30/2020    Patient Stated Goals I want to get the swelling in my arm better and the my shoulder range to ride my motorcycle, work in yard    Currently in Pain? Yes    Pain Score 1     Pain Location Axilla    Pain Orientation Right    Pain Descriptors / Indicators Tender;Tightness    Pain Type Acute pain;Surgical pain    Pain Onset More than a month ago    Pain Frequency Intermittent              [facility] OT Assessment - 07/01/20 0001      AROM   Right Shoulder Extension 60 Degrees    Right Shoulder Flexion 138 Degrees    Right Shoulder ABduction 138 Degrees  Left Shoulder Extension 60 Degrees           LYMPHEDEMA/ONCOLOGY QUESTIONNAIRE - 07/01/20 0001      Right Upper Extremity Lymphedema   15 cm Proximal to Olecranon Process 37.5 cm    10 cm Proximal to Olecranon Process 36 cm    Olecranon Process 29.4 cm    15 cm Proximal to Ulnar Styloid Process 28.4 cm    10 cm Proximal to Ulnar Styloid Process 24 cm    Just Proximal to Ulnar Styloid Process 18.5 cm    Across Hand at [employer] 20.8 cm    At [place] of 2nd Digit 6.5 cm    At [facility] of Thumb 6.5 cm              Did not show thoracic lymphedema again this date - wearing good sport bra with wide part under arm Wearing medi harmony over the compression sleeve and glove during day since last week -and decrease in forearm - wrist and elbow still increase compare to R by 1 cm  Pt to cont with wearing isotoner glove to wear night time with tubigrip D on hand to elbow  Pt bilateral  shoulder AROM cont to increase   AAROM for shoulder flexion and ABD on wall And ext rotation stretch in corner  10 reps hold 5 sec - slight pull under arm And lateral cervical stretch L for upper traps in shower Cont AAROM using golf club or something for shoulder flexion and shoulder ABD in supine  10reps  And this date - tightness in axilla - above scar tissue on lateral breast - 10/10 this am - with soft tissue  And scar massage  Pt to focus on gentle massage and soft tissue with arm over head on fore head or pillow -  Add protraction of scapula in supine - 12-15 reps Child pose - yoga for axilla and lat stretch 10 reps- forward, and going to L and R  Wall pushup - pain free 10 reps      Manual Lymph Drainage  Do manual lymph drainage once each day to help decrease swelling.  This should take you about 30 minutes depending on the size of your limb.  For Right Arm:  Felicie Morn yourself at the base of your neck and do 8 small circles, and 2 fingers behind clavicle 8 x  . Do 8 semicircles at left armpit and right groin . Pump across chest from right to left 8 times . Pump down the right side of trunk from armpit to groin 8 times . Pump up the outside of right upper arm 8 times,  . Pump across chest from R to L 8 times . Pump  down the right side of trunk from armpit to groin 8 times . Do 8 semicircles at left armpit and right groin 8 times . Repeat nr.1  SLOW and LIGHT with only your palm NOT FINGERTIPS Review again and to do at home            OT Education - 07/01/20 0940    Education Details progress and HEP changes    Person(s) Educated Patient    Methods Explanation;Demonstration;Tactile cues;Verbal cues;Handout    Comprehension Verbal cues required;Returned demonstration;Verbalized understanding            OT Short Term Goals - 06/24/20 0857      OT SHORT TERM GOAL #1   Title Pt's R shoulder AROM increase to WNL to pull  sweater over head, reach into cabinets  without any symptoms    Baseline decrease R shoulder flexion , abd - pull and tightness under arm , pain 1/10 in shoulder    Time 4    Period Weeks    Status New    Target Date 07/22/20             OT Long Term Goals - 06/24/20 0858      OT LONG TERM GOAL #1   Title R UE circumference decrease by  1/2 to 1 cm from forearm to upper arm to be in range compare to L dominant UE to maintain with correct compression homeprogram    Baseline R UE increase see flowsheet- fitted with Medi harmony and gauntlet - pt to wear glove -and during day    Time 6    Period Weeks    Status New    Target Date 08/05/20      OT LONG TERM GOAL #2   Title Pt to be independent in wearing correct compression garments to prevent issues with lymphedema and maintain in stage 1    Baseline stage 1 moving into stage 2 lymphedema- did not wear any compression prior to 2 wks ago - and with increase use - arm feel swollen , increase heaviness and tight    Time 6    Period Weeks    Status New    Target Date 08/05/20      OT LONG TERM GOAL #3   Title Pt to show increase strength in R UE to 5/5 to return to swimming, motor cycle and yard work    Baseline decrease AROM in flexion and ABD - 130 and 135 - tightness under arm , stiffness    Time 6    Period Weeks    Status New    Target Date 08/05/20                 Plan - 07/01/20 0941    Clinical Impression Statement Pt measurements in R circumference  decrease except wrist and elbow still increase by 1 cm - pt to cont to wear her harmony over the counter sleeve and glove - and night time tubigrip on forearm with isotoner glove - R shoulder AROM cont to increase but cont to have tightness in axilla and scar tissue - add yoga child pose and pt to cont doing AAROM more than over head AROM and strengthening - focus on soft tissue/scar massage with arm over head    OT Occupational Profile and History Problem Focused Assessment - Including review of records  relating to presenting problem    Occupational performance deficits (Please refer to evaluation for details): ADL's;IADL's;Work;Play;Leisure    Body Structure / Function / Physical Skills ADL;Flexibility;Decreased knowledge of precautions;ROM;UE functional use;Scar mobility;Edema;Pain;Strength;IADL    Rehab Potential Good    Clinical Decision Making Limited treatment options, no task modification necessary    Comorbidities Affecting Occupational Performance: None    Modification or Assistance to Complete Evaluation  No modification of tasks or assist necessary to complete eval    OT Frequency 1x / week    OT Duration 6 weeks    OT Treatment/Interventions Self-care/ADL training;Therapeutic exercise;Patient/family education;Scar mobilization;Manual Therapy;Passive range of motion    Plan Assess circumference decrease with wearing of daytime compression , and increase R shoulder AROM ?    Consulted and Agree with Plan of Care Patient           Patient will benefit  from skilled therapeutic intervention in order to improve the following deficits and impairments:   Body Structure / Function / Physical Skills: ADL, Flexibility, Decreased knowledge of precautions, ROM, UE functional use, Scar mobility, Edema, Pain, Strength, IADL       Visit Diagnosis: Stiffness of right shoulder, not elsewhere classified  Scar condition and fibrosis of skin  Lymphedema, not elsewhere classified    Problem List Patient Active Problem List   Diagnosis Date Noted  . Aromatase inhibitor use 06/06/2020  . Neuropathy due to chemotherapeutic drug ([place]) 05/07/2020  . Port-A-Cath in place 05/07/2020  . Suppression of ovarian secretion 05/07/2020  . Kidney stone 02/23/2020  . B12 deficiency 01/12/2020  . Mixed stress and urge urinary incontinence 01/12/2020  . Genetic testing 11/14/2019  . Family history of cervical cancer   . Family history of lung cancer   . Goals of care, counseling/discussion  08/26/2019  . Iron deficiency 08/26/2019  . Malignant neoplasm of lower-inner quadrant of right breast of female, estrogen receptor positive ([place]) 08/05/2019  . Bursitis of left hip 08/04/2019  . Sciatica 08/04/2019  . Breast cancer metastasized to axillary lymph node, right ([place]) 07/31/2019  . Chronic midline low back pain without sciatica 04/18/2018  . Lumbar spondylosis 04/18/2018  . Scoliosis of thoracolumbar spine 04/01/2017  . Plantar fasciitis 03/06/2017    Rosalyn Gess OTR/L,CLT 07/01/2020, 9:45 AM  [facility] [address] [place], [address] Phone: [phone]   Fax:  [phone]  Name: Jessica [name] MRN: [id number] Date of Birth: August 27, 1976

## 2020-07-02 ENCOUNTER — Inpatient Hospital Stay: Payer: BC Managed Care – PPO

## 2020-07-04 ENCOUNTER — Encounter: Payer: BC Managed Care – PPO | Admitting: Occupational Therapy

## 2020-07-09 ENCOUNTER — Other Ambulatory Visit: Payer: Self-pay

## 2020-07-09 ENCOUNTER — Inpatient Hospital Stay: Payer: BC Managed Care – PPO

## 2020-07-09 ENCOUNTER — Inpatient Hospital Stay: Payer: BC Managed Care – PPO | Attending: Oncology

## 2020-07-09 VITALS — BP 116/83 | HR 76 | Temp 98.1°F | Resp 16

## 2020-07-09 DIAGNOSIS — Z95828 Presence of other vascular implants and grafts: Secondary | ICD-10-CM

## 2020-07-09 DIAGNOSIS — Z452 Encounter for adjustment and management of vascular access device: Secondary | ICD-10-CM | POA: Diagnosis not present

## 2020-07-09 DIAGNOSIS — Z170 Estrogen receptor positive status [ER+]: Secondary | ICD-10-CM | POA: Diagnosis not present

## 2020-07-09 DIAGNOSIS — Z79818 Long term (current) use of other agents affecting estrogen receptors and estrogen levels: Secondary | ICD-10-CM | POA: Diagnosis not present

## 2020-07-09 DIAGNOSIS — C50311 Malignant neoplasm of lower-inner quadrant of right female breast: Secondary | ICD-10-CM

## 2020-07-09 MED ORDER — HEPARIN SOD (PORK) LOCK FLUSH 100 UNIT/ML IV SOLN
500.0000 [IU] | Freq: Once | INTRAVENOUS | Status: AC
  Administered 2020-07-09: 500 [IU] via INTRAVENOUS
  Filled 2020-07-09: qty 5

## 2020-07-09 MED ORDER — HEPARIN SOD (PORK) LOCK FLUSH 100 UNIT/ML IV SOLN
INTRAVENOUS | Status: AC
  Filled 2020-07-09: qty 5

## 2020-07-09 MED ORDER — SODIUM CHLORIDE 0.9% FLUSH
10.0000 mL | INTRAVENOUS | Status: DC | PRN
  Administered 2020-07-09: 10 mL via INTRAVENOUS
  Filled 2020-07-09: qty 10

## 2020-07-09 MED ORDER — GOSERELIN ACETATE 3.6 MG ~~LOC~~ IMPL
3.6000 mg | DRUG_IMPLANT | Freq: Once | SUBCUTANEOUS | Status: AC
  Administered 2020-07-09: 3.6 mg via SUBCUTANEOUS
  Filled 2020-07-09: qty 3.6

## 2020-07-11 ENCOUNTER — Ambulatory Visit: Payer: BC Managed Care – PPO | Attending: Oncology | Admitting: Occupational Therapy

## 2020-07-12 ENCOUNTER — Other Ambulatory Visit: Payer: Self-pay | Admitting: Oncology

## 2020-07-12 NOTE — Telephone Encounter (Signed)
Tried calling patient but not answer and unable to leave a message.  Will send a MyChart message.

## 2020-07-12 NOTE — Telephone Encounter (Signed)
Does she still need omeprazole? If no heart burn or acid reflux, recommend her to stop. If she still needs, ok to refill another 3 months.

## 2020-07-23 ENCOUNTER — Other Ambulatory Visit: Payer: Self-pay | Admitting: Oncology

## 2020-08-06 ENCOUNTER — Other Ambulatory Visit: Payer: Self-pay

## 2020-08-06 ENCOUNTER — Inpatient Hospital Stay: Payer: BC Managed Care – PPO | Attending: Oncology

## 2020-08-06 DIAGNOSIS — Z9221 Personal history of antineoplastic chemotherapy: Secondary | ICD-10-CM | POA: Insufficient documentation

## 2020-08-06 DIAGNOSIS — Z170 Estrogen receptor positive status [ER+]: Secondary | ICD-10-CM | POA: Diagnosis not present

## 2020-08-06 DIAGNOSIS — C50311 Malignant neoplasm of lower-inner quadrant of right female breast: Secondary | ICD-10-CM

## 2020-08-06 DIAGNOSIS — Z79818 Long term (current) use of other agents affecting estrogen receptors and estrogen levels: Secondary | ICD-10-CM | POA: Diagnosis not present

## 2020-08-06 MED ORDER — GOSERELIN ACETATE 3.6 MG ~~LOC~~ IMPL
3.6000 mg | DRUG_IMPLANT | Freq: Once | SUBCUTANEOUS | Status: AC
  Administered 2020-08-06: 3.6 mg via SUBCUTANEOUS
  Filled 2020-08-06: qty 3.6

## 2020-08-23 ENCOUNTER — Other Ambulatory Visit: Payer: Self-pay | Admitting: Oncology

## 2020-08-27 ENCOUNTER — Inpatient Hospital Stay: Payer: BC Managed Care – PPO

## 2020-09-06 ENCOUNTER — Other Ambulatory Visit: Payer: Self-pay

## 2020-09-06 DIAGNOSIS — Z170 Estrogen receptor positive status [ER+]: Secondary | ICD-10-CM

## 2020-09-06 DIAGNOSIS — C50311 Malignant neoplasm of lower-inner quadrant of right female breast: Secondary | ICD-10-CM

## 2020-09-09 ENCOUNTER — Encounter: Payer: Self-pay | Admitting: Oncology

## 2020-09-09 ENCOUNTER — Inpatient Hospital Stay: Payer: BC Managed Care – PPO

## 2020-09-09 ENCOUNTER — Inpatient Hospital Stay (HOSPITAL_BASED_OUTPATIENT_CLINIC_OR_DEPARTMENT_OTHER): Payer: BC Managed Care – PPO | Admitting: Oncology

## 2020-09-09 ENCOUNTER — Inpatient Hospital Stay: Payer: BC Managed Care – PPO | Attending: Oncology

## 2020-09-09 VITALS — BP 114/84 | HR 70 | Temp 97.4°F | Resp 16 | Wt 213.5 lb

## 2020-09-09 DIAGNOSIS — Z79818 Long term (current) use of other agents affecting estrogen receptors and estrogen levels: Secondary | ICD-10-CM | POA: Diagnosis not present

## 2020-09-09 DIAGNOSIS — Z170 Estrogen receptor positive status [ER+]: Secondary | ICD-10-CM | POA: Insufficient documentation

## 2020-09-09 DIAGNOSIS — Z9221 Personal history of antineoplastic chemotherapy: Secondary | ICD-10-CM | POA: Diagnosis not present

## 2020-09-09 DIAGNOSIS — Z79811 Long term (current) use of aromatase inhibitors: Secondary | ICD-10-CM | POA: Insufficient documentation

## 2020-09-09 DIAGNOSIS — C50311 Malignant neoplasm of lower-inner quadrant of right female breast: Secondary | ICD-10-CM | POA: Insufficient documentation

## 2020-09-09 DIAGNOSIS — G620 Drug-induced polyneuropathy: Secondary | ICD-10-CM | POA: Diagnosis not present

## 2020-09-09 DIAGNOSIS — T451X5A Adverse effect of antineoplastic and immunosuppressive drugs, initial encounter: Secondary | ICD-10-CM

## 2020-09-09 DIAGNOSIS — E2839 Other primary ovarian failure: Secondary | ICD-10-CM

## 2020-09-09 LAB — CBC
HCT: 36.6 % (ref 36.0–46.0)
Hemoglobin: 12.3 g/dL (ref 12.0–15.0)
MCH: 28.7 pg (ref 26.0–34.0)
MCHC: 33.6 g/dL (ref 30.0–36.0)
MCV: 85.3 fL (ref 80.0–100.0)
Platelets: 271 10*3/uL (ref 150–400)
RBC: 4.29 MIL/uL (ref 3.87–5.11)
RDW: 12.8 % (ref 11.5–15.5)
WBC: 6.6 10*3/uL (ref 4.0–10.5)
nRBC: 0 % (ref 0.0–0.2)

## 2020-09-09 LAB — COMPREHENSIVE METABOLIC PANEL
ALT: 17 U/L (ref 0–44)
AST: 17 U/L (ref 15–41)
Albumin: 4.1 g/dL (ref 3.5–5.0)
Alkaline Phosphatase: 74 U/L (ref 38–126)
Anion gap: 6 (ref 5–15)
BUN: 12 mg/dL (ref 6–20)
CO2: 27 mmol/L (ref 22–32)
Calcium: 8.8 mg/dL — ABNORMAL LOW (ref 8.9–10.3)
Chloride: 105 mmol/L (ref 98–111)
Creatinine, Ser: 0.59 mg/dL (ref 0.44–1.00)
GFR, Estimated: >60 mL/min (ref >60)
Glucose, Bld: 97 mg/dL (ref 70–99)
Potassium: 3.5 mmol/L (ref 3.5–5.1)
Sodium: 138 mmol/L (ref 135–145)
Total Bilirubin: 0.6 mg/dL (ref 0.3–1.2)
Total Protein: 7.3 g/dL (ref 6.5–8.1)

## 2020-09-09 MED ORDER — GOSERELIN ACETATE 3.6 MG ~~LOC~~ IMPL
3.6000 mg | DRUG_IMPLANT | Freq: Once | SUBCUTANEOUS | Status: AC
  Administered 2020-09-09: 3.6 mg via SUBCUTANEOUS
  Filled 2020-09-09: qty 3.6

## 2020-09-09 MED ORDER — HEPARIN SOD (PORK) LOCK FLUSH 100 UNIT/ML IV SOLN
500.0000 [IU] | Freq: Once | INTRAVENOUS | Status: AC
  Administered 2020-09-09: 500 [IU] via INTRAVENOUS
  Filled 2020-09-09: qty 5

## 2020-09-09 MED ORDER — SODIUM CHLORIDE 0.9% FLUSH
10.0000 mL | Freq: Once | INTRAVENOUS | Status: AC
  Administered 2020-09-09: 10 mL via INTRAVENOUS
  Filled 2020-09-09: qty 10

## 2020-09-09 MED ORDER — HEPARIN SOD (PORK) LOCK FLUSH 100 UNIT/ML IV SOLN
INTRAVENOUS | Status: AC
  Filled 2020-09-09: qty 5

## 2020-09-09 NOTE — Progress Notes (Signed)
Patient reports headaches, hot flashes, and itchy skin especially with hot flash or sun exposure.

## 2020-09-09 NOTE — Progress Notes (Signed)
Jessica [name]   [phone] [phone]   Clinic Follow up Note   Patient Care Team: Sharyne Peach, MD as PCP - General (Family Medicine) Earlie Server, MD as Consulting Physician (Oncology) Benjamine Sprague, DO as Consulting Physician (Surgery) Rico Junker, RN as Registered Nurse Noreene Filbert, MD as Radiation Oncologist (Radiation Oncology)  Date of Service:  09/09/2020  CHIEF COMPLAINT: F/u of right breast cancer   SUMMARY OF ONCOLOGIC HISTORY: Oncology History Overview Note  Cancer Staging Malignant neoplasm of lower-inner quadrant of right breast of female, estrogen receptor positive ([place]) Staging form: Breast, AJCC 8th Edition - Clinical stage from 07/26/2019: Stage IIB (cT2, cN1, cM0, G3, ER+, PR+, HER2-) - Unsigned    Malignant neoplasm of lower-inner quadrant of right breast of female, estrogen receptor positive ([place])  07/20/2019 Mammogram   Mammogram 07/20/19  IMPRESSION: Suspicious palpable right breast mass 3.4 x 2.5 x 3.4 cm 5 o'clock position 3 cm from nipple.   Suspicious 1.6cm cortically thickened right axillary lymph node.   07/26/2019 Initial Diagnosis   DIAGNOSIS: 07/26/19 A. RIGHT BREAST, 5:00, 3CMFN; ULTRASOUND-GUIDED NEEDLE CORE BIOPSY:  - INVASIVE MAMMARY CARCINOMA, NO SPECIAL TYPE.    07/26/2019 Receptors her2   BREAST BIOMARKER TESTS  Estrogen Receptor (ER) Status: POSITIVE                       Percentage of cells with nuclear positivity: 51-90%                       Average intensity of staining: Strong   Progesterone Receptor (PgR) Status: POSITIVE                       Percentage of cells with nuclear positivity:  Greater than 90%                       Average intensity of staining: Strong   HER2 (by immunohistochemistry): NEGATIVE (Score 1+)    08/05/2019 Initial Diagnosis   Invasive carcinoma of breast ([place])   08/17/2019 Surgery   INSERTION PORT-A-CATH by Dr. Lysle Pearl 08/17/19    08/18/2019 -  Chemotherapy    AC q2weeks for 4 cycles starting 08/18/19 followed by weekly Taxol for 12 weeks     Genetic Testing   Negative genetic testing. No pathogenic variants identified on the [facility] Common Hereditary Cancers Panel. The report date is 11/13/2019.   The Common Hereditary Cancers Panel offered by [facility] includes sequencing and/or deletion duplication testing of the following 48 genes: APC, ATM, AXIN2, BARD1, BMPR1A, BRCA1, BRCA2, BRIP1, CDH1, CDKN2A (p14ARF), CDKN2A (p16INK4a), CKD4, CHEK2, CTNNA1, DICER1, EPCAM (Deletion/duplication testing only), GREM1 (promoter region deletion/duplication testing only), KIT, MEN1, MLH1, MSH2, MSH3, MSH6, MUTYH, NBN, NF1, NHTL1, PALB2, PDGFRA, PMS2, POLD1, POLE, PTEN, RAD50, RAD51C, RAD51D, RNF43, SDHB, SDHC, SDHD, SMAD4, SMARCA4. STK11, TP53, TSC1, TSC2, and VHL.  The following genes were evaluated for sequence changes only: SDHA and HOXB13 c.251G>A variant only.      CANCER THERAPY:  Neoadjuvant chemo ddAC starting 08/18/19 for 4 cycles followed by weekly Taxol x 12 Finished chemotherapy on 01/01/2020.   02/01/2020 s/p right lumpectomy and axillary lymph node dissection. ypT1c ypN1a(sn)  ER/PR positive, HER2 negative, residual invasive mammary carcinoma, grade 3.  1/3 sentinel lymph node positive and 2/10 right axillary lymph nodes positive.  DCIS present. Margin was negative for invasive carcinoma and DCIS Finished adjuvant radiation on 04/30/2020 #  July 2021 started ovarian suppression with Zoladex monthly, started Arimidex 1 mg daily.  INTERVAL HISTORY:  Jessica [name] presents to follow-up for breast cancer. Patient is on ovarian suppression with Zoladex monthly and Arimidex 1 mg daily  Overall she tolerates well.  She has manageable hot flash. Continues to have headache which usually relieves with Tylenol.   Review of Systems  Constitutional: Positive for fatigue. Negative for appetite change, chills and fever.  HENT:   Negative for hearing loss and  voice change.   Eyes: Negative for eye problems.  Respiratory: Negative for chest tightness and cough.   Cardiovascular: Negative for chest pain.  Gastrointestinal: Negative for abdominal distention, abdominal pain and blood in stool.  Endocrine: Negative for hot flashes.  Genitourinary: Negative for difficulty urinating and frequency.   Musculoskeletal: Negative for arthralgias.       Bilateral lower extremity thigh pain, left worse than right.  Skin: Negative for itching and rash.  Neurological: Positive for headaches. Negative for extremity weakness and numbness.  Hematological: Negative for adenopathy.  Psychiatric/Behavioral: Negative for confusion and sleep disturbance.    MEDICAL HISTORY:  Past Medical History:  Diagnosis Date  . Anemia   . Arthritis    left ankle  . Breast cancer ([place]) 07/23/2019   right breast- [facility]  . Cancer [facility])    breast cancer  . Family history of cervical cancer   . Family history of lung cancer   . Frequency   . GERD (gastroesophageal reflux disease)   . History of kidney stones    h/o  . Obesity   . Personal history of chemotherapy   . Right flank pain     SURGICAL HISTORY: Past Surgical History:  Procedure Laterality Date  . AXILLARY LYMPH NODE DISSECTION Right 02/01/2020   Procedure: AXILLARY LYMPH NODE DISSECTION;  Surgeon: Benjamine Sprague, DO;  Location: [facility] ORS;  Service: General;  Laterality: Right;  . BREAST BIOPSY Right 2020   [facility]  . CESAREAN SECTION     x 2  . CHOLECYSTECTOMY    . DILATION AND CURETTAGE OF UTERUS    . LITHOTRIPSY    . PORTACATH PLACEMENT Left 08/17/2019   Procedure: INSERTION PORT-A-CATH;  Surgeon: Benjamine Sprague, DO;  Location: [facility] ORS;  Service: General;  Laterality: Left;  . TUBAL LIGATION      I have reviewed the social history and family history with the patient and they are unchanged from previous note.  ALLERGIES:  has No Known Allergies.  MEDICATIONS:  Current Outpatient Medications  Medication  Sig Dispense Refill  . anastrozole (ARIMIDEX) 1 MG tablet TAKE 1 TABLET BY MOUTH EVERY DAY 30 tablet 0  . ibuprofen (ADVIL) 800 MG tablet Take 1 tablet (800 mg total) by mouth every 8 (eight) hours as needed for mild pain or moderate pain. 30 tablet 0  . lidocaine-prilocaine (EMLA) cream Apply 1 application topically as needed. Apply small amount to port site 1-2 hours prior to appointment. 30 g 2  . omeprazole (PRILOSEC) 20 MG capsule TAKE 1 CAPSULE BY MOUTH EVERY DAY 90 capsule 0  . Dermatological Products, Misc. (STRATA XRT) GEL Apply topically. (Patient not taking: Reported on 06/06/2020)    . ferrous sulfate 325 (65 FE) MG EC tablet TAKE 1 TABLET (325 MG TOTAL) BY MOUTH 2 (TWO) TIMES DAILY WITH A MEAL. (Patient not taking: Reported on 05/07/2020) 60 tablet 1  . gabapentin (NEURONTIN) 600 MG tablet Take 600 mg by mouth 3 (three) times daily. (Patient not taking: Reported  on 05/07/2020)    . HYDROcodone-acetaminophen (NORCO) 5-325 MG tablet Take 1 tablet by mouth every 6 (six) hours as needed for up to 15 doses for moderate pain. (Patient not taking: Reported on 05/07/2020) 6 tablet 0   No current facility-administered medications for this visit.   Facility-Administered Medications Ordered in Other Visits  Medication Dose Route Frequency Provider Last Rate Last Admin  . sodium chloride flush (NS) 0.9 % injection 10 mL  10 mL Intravenous Once Earlie Server, MD        PHYSICAL EXAMINATION: ECOG PERFORMANCE STATUS: 1 - Symptomatic but completely ambulatory  Vitals:   09/09/20 1319  BP: 114/84  Pulse: 70  Resp: 16  Temp: (!) 97.4 F (36.3 C)   Filed Weights   09/09/20 1319  Weight: 213 lb 8 oz (96.8 kg)   Physical Exam Constitutional:      General: She is not in acute distress.    Appearance: She is not diaphoretic.  HENT:     Head: Normocephalic and atraumatic.     Nose: Nose normal.     Mouth/Throat:     Pharynx: No oropharyngeal exudate.  Eyes:     General: No scleral icterus.     Pupils: Pupils are equal, round, and reactive to light.  Cardiovascular:     Rate and Rhythm: Normal rate and regular rhythm.     Heart sounds: No murmur heard.   Pulmonary:     Effort: Pulmonary effort is normal. No respiratory distress.     Breath sounds: No rales.  Chest:     Chest wall: No tenderness.  Abdominal:     General: There is no distension.     Palpations: Abdomen is soft.     Tenderness: There is no abdominal tenderness.  Musculoskeletal:        General: Normal range of motion.     Cervical back: Normal range of motion and neck supple.  Skin:    General: Skin is warm and dry.     Findings: No erythema.  Neurological:     Mental Status: She is alert and oriented to person, place, and time.     Cranial Nerves: No cranial nerve deficit.     Motor: No abnormal muscle tone.     Coordination: Coordination normal.  Psychiatric:        Mood and Affect: Affect normal.      LABORATORY DATA:  I have reviewed the data as listed CBC Latest Ref Rng & Units 09/09/2020 06/06/2020 06/06/2020  WBC 4.0 - 10.5 K/uL 6.6 6.7 4.5  Hemoglobin 12.0 - 15.0 g/dL 12.3 12.5 12.7  Hematocrit 36 - 46 % 36.6 36.4 37.6  Platelets 150 - 400 K/uL 271 257 250     CMP Latest Ref Rng & Units 09/09/2020 06/06/2020 06/06/2020  Glucose 70 - 99 mg/dL 97 102(H) 114(H)  BUN 6 - 20 mg/dL '12 13 9  ' Creatinine 0.44 - 1.00 mg/dL 0.59 0.78 0.69  Sodium 135 - 145 mmol/L 138 141 141  Potassium 3.5 - 5.1 mmol/L 3.5 3.7 3.9  Chloride 98 - 111 mmol/L 105 103 104  CO2 22 - 32 mmol/L '27 28 28  ' Calcium 8.9 - 10.3 mg/dL 8.8(L) 8.9 9.0  Total Protein 6.5 - 8.1 g/dL 7.3 - 7.4  Total Bilirubin 0.3 - 1.2 mg/dL 0.6 - 0.7  Alkaline Phos 38 - 126 U/L 74 - 105  AST 15 - 41 U/L 17 - 21  ALT 0 - 44 U/L 17 - 20  RADIOGRAPHIC STUDIES: I have personally reviewed the radiological images as listed and agreed with the findings in the report. No results found.   ASSESSMENT & PLAN:  1. Malignant neoplasm of lower-inner  quadrant of right breast of female, estrogen receptor positive ([place])   2. Suppression of ovarian secretion   3. Aromatase inhibitor use   4. Neuropathy due to chemotherapeutic drug ([place])     Invasive right breast cancer,  Cancer Staging Malignant neoplasm of lower-inner quadrant of right breast of female, estrogen receptor positive ([place]) Staging form: Breast, AJCC 8th Edition - Clinical stage from 07/26/2019: Stage IIB (cT2, cN1, cM0, G3, ER+, PR+, HER2-) - Unsigned -Status post 4 cycles of dose dense Adriamycin and Cytoxan, s/p weekly Taxol x 12.  Pathology ypT1c ypN1a(sn)  Finished adjuvant radiation. Patient is premenopausal. Continue Arimidex 1 mg daily as well as monthly Zoladex for ovarian suppression. Patient has question about surgical ovarian suppression and we discussed the details. Labs are reviewed and discussed with patient Left breast is due for annual diagnostic mammogram now Obtain bilateral diagnostic mammogram.  #Persistent chronic headache, with her history of breast cancer, I will obtain MRI brain to rule out brain metastasis.  #Continue adjuvant Zometa every 6 months.  She will be due in 3 months.  Recommend patient to continue vitamin D and calcium supplementation.  #Neuropathy due to chemotherapy, pre-existing sciatica, I recommend her to resume gabapentin 332m BID.   ##Port-A-Cath in place, patient will need port flush every  8 weeks.  I discussed about keeping port for at least 1 year.  Follow-up monthly Zoledex, follow up in 3 months.   All questions were answered. The patient knows to call the clinic with any problems, questions or concerns. No barriers to learning was detected.     ZEarlie Server MD 09/09/2020

## 2020-09-20 ENCOUNTER — Ambulatory Visit
Admission: RE | Admit: 2020-09-20 | Discharge: 2020-09-20 | Disposition: A | Discharge status: Home / Self Care | Payer: BC Managed Care – PPO | Source: Ambulatory Visit | Attending: Oncology | Admitting: Oncology

## 2020-09-20 ENCOUNTER — Other Ambulatory Visit: Payer: Self-pay

## 2020-09-20 DIAGNOSIS — C50311 Malignant neoplasm of lower-inner quadrant of right female breast: Secondary | ICD-10-CM

## 2020-09-20 DIAGNOSIS — Z170 Estrogen receptor positive status [ER+]: Secondary | ICD-10-CM | POA: Insufficient documentation

## 2020-09-22 ENCOUNTER — Ambulatory Visit
Admission: RE | Admit: 2020-09-22 | Discharge: 2020-09-22 | Disposition: A | Discharge status: Home / Self Care | Payer: BC Managed Care – PPO | Source: Ambulatory Visit | Attending: Oncology | Admitting: Oncology

## 2020-09-22 ENCOUNTER — Other Ambulatory Visit: Payer: Self-pay

## 2020-09-22 DIAGNOSIS — Z170 Estrogen receptor positive status [ER+]: Secondary | ICD-10-CM | POA: Insufficient documentation

## 2020-09-22 DIAGNOSIS — C50311 Malignant neoplasm of lower-inner quadrant of right female breast: Secondary | ICD-10-CM

## 2020-09-22 MED ORDER — GADOBUTROL 1 MMOL/ML IV SOLN
10.0000 mL | Freq: Once | INTRAVENOUS | Status: AC | PRN
  Administered 2020-09-22: 10 mL via INTRAVENOUS

## 2020-09-23 ENCOUNTER — Other Ambulatory Visit: Payer: Self-pay | Admitting: Oncology

## 2020-10-03 ENCOUNTER — Other Ambulatory Visit: Payer: Self-pay | Admitting: Oncology

## 2020-10-07 ENCOUNTER — Inpatient Hospital Stay: Payer: BC Managed Care – PPO | Attending: Oncology

## 2020-10-07 VITALS — BP 111/73 | HR 75 | Temp 96.9°F | Resp 17

## 2020-10-07 DIAGNOSIS — Z452 Encounter for adjustment and management of vascular access device: Secondary | ICD-10-CM | POA: Diagnosis not present

## 2020-10-07 DIAGNOSIS — Z79818 Long term (current) use of other agents affecting estrogen receptors and estrogen levels: Secondary | ICD-10-CM | POA: Diagnosis not present

## 2020-10-07 DIAGNOSIS — Z170 Estrogen receptor positive status [ER+]: Secondary | ICD-10-CM | POA: Diagnosis not present

## 2020-10-07 DIAGNOSIS — C50311 Malignant neoplasm of lower-inner quadrant of right female breast: Secondary | ICD-10-CM | POA: Diagnosis not present

## 2020-10-07 DIAGNOSIS — Z9221 Personal history of antineoplastic chemotherapy: Secondary | ICD-10-CM | POA: Diagnosis not present

## 2020-10-07 MED ORDER — GOSERELIN ACETATE 3.6 MG ~~LOC~~ IMPL
3.6000 mg | DRUG_IMPLANT | Freq: Once | SUBCUTANEOUS | Status: AC
  Administered 2020-10-07: 3.6 mg via SUBCUTANEOUS
  Filled 2020-10-07: qty 3.6

## 2020-10-17 ENCOUNTER — Other Ambulatory Visit: Payer: Self-pay | Admitting: Oncology

## 2020-10-22 ENCOUNTER — Inpatient Hospital Stay: Payer: BC Managed Care – PPO

## 2020-10-22 DIAGNOSIS — C50311 Malignant neoplasm of lower-inner quadrant of right female breast: Secondary | ICD-10-CM | POA: Diagnosis not present

## 2020-10-22 DIAGNOSIS — Z95828 Presence of other vascular implants and grafts: Secondary | ICD-10-CM

## 2020-10-22 MED ORDER — SODIUM CHLORIDE 0.9% FLUSH
10.0000 mL | Freq: Once | INTRAVENOUS | Status: AC
  Administered 2020-10-22: 10 mL via INTRAVENOUS
  Filled 2020-10-22: qty 10

## 2020-10-22 MED ORDER — HEPARIN SOD (PORK) LOCK FLUSH 100 UNIT/ML IV SOLN
500.0000 [IU] | Freq: Once | INTRAVENOUS | Status: AC
  Administered 2020-10-22: 500 [IU] via INTRAVENOUS
  Filled 2020-10-22: qty 5

## 2020-11-06 ENCOUNTER — Other Ambulatory Visit: Payer: BC Managed Care – PPO

## 2020-11-06 ENCOUNTER — Ambulatory Visit: Payer: BC Managed Care – PPO | Admitting: Radiation Oncology

## 2020-11-08 ENCOUNTER — Other Ambulatory Visit: Payer: Self-pay

## 2020-11-08 ENCOUNTER — Inpatient Hospital Stay: Payer: BC Managed Care – PPO | Attending: Oncology

## 2020-11-08 ENCOUNTER — Ambulatory Visit: Payer: Self-pay | Admitting: Radiation Oncology

## 2020-11-08 DIAGNOSIS — C50311 Malignant neoplasm of lower-inner quadrant of right female breast: Secondary | ICD-10-CM | POA: Diagnosis not present

## 2020-11-08 DIAGNOSIS — Z79818 Long term (current) use of other agents affecting estrogen receptors and estrogen levels: Secondary | ICD-10-CM | POA: Insufficient documentation

## 2020-11-08 DIAGNOSIS — Z170 Estrogen receptor positive status [ER+]: Secondary | ICD-10-CM | POA: Insufficient documentation

## 2020-11-08 MED ORDER — GOSERELIN ACETATE 3.6 MG ~~LOC~~ IMPL
3.6000 mg | DRUG_IMPLANT | Freq: Once | SUBCUTANEOUS | Status: AC
  Administered 2020-11-08: 3.6 mg via SUBCUTANEOUS
  Filled 2020-11-08: qty 3.6

## 2020-11-11 ENCOUNTER — Other Ambulatory Visit: Payer: Self-pay | Admitting: Oncology

## 2020-12-10 ENCOUNTER — Other Ambulatory Visit: Payer: Self-pay

## 2020-12-10 DIAGNOSIS — Z170 Estrogen receptor positive status [ER+]: Secondary | ICD-10-CM

## 2020-12-10 DIAGNOSIS — C50311 Malignant neoplasm of lower-inner quadrant of right female breast: Secondary | ICD-10-CM

## 2020-12-11 ENCOUNTER — Encounter: Payer: Self-pay | Admitting: Oncology

## 2020-12-11 ENCOUNTER — Inpatient Hospital Stay: Payer: BC Managed Care – PPO | Attending: Oncology

## 2020-12-11 ENCOUNTER — Inpatient Hospital Stay (HOSPITAL_BASED_OUTPATIENT_CLINIC_OR_DEPARTMENT_OTHER): Payer: BC Managed Care – PPO | Admitting: Oncology

## 2020-12-11 ENCOUNTER — Ambulatory Visit
Admission: RE | Admit: 2020-12-11 | Discharge: 2020-12-11 | Disposition: A | Discharge status: Home / Self Care | Payer: BC Managed Care – PPO | Source: Ambulatory Visit | Attending: Radiation Oncology | Admitting: Radiation Oncology

## 2020-12-11 ENCOUNTER — Inpatient Hospital Stay: Payer: BC Managed Care – PPO

## 2020-12-11 VITALS — Wt 215.0 lb

## 2020-12-11 VITALS — BP 121/84 | HR 101 | Temp 98.0°F | Resp 18 | Wt 215.8 lb

## 2020-12-11 DIAGNOSIS — R519 Headache, unspecified: Secondary | ICD-10-CM | POA: Diagnosis not present

## 2020-12-11 DIAGNOSIS — Z9221 Personal history of antineoplastic chemotherapy: Secondary | ICD-10-CM | POA: Insufficient documentation

## 2020-12-11 DIAGNOSIS — R5383 Other fatigue: Secondary | ICD-10-CM | POA: Insufficient documentation

## 2020-12-11 DIAGNOSIS — Z9049 Acquired absence of other specified parts of digestive tract: Secondary | ICD-10-CM | POA: Insufficient documentation

## 2020-12-11 DIAGNOSIS — Z95828 Presence of other vascular implants and grafts: Secondary | ICD-10-CM | POA: Diagnosis not present

## 2020-12-11 DIAGNOSIS — Z791 Long term (current) use of non-steroidal anti-inflammatories (NSAID): Secondary | ICD-10-CM | POA: Insufficient documentation

## 2020-12-11 DIAGNOSIS — Z79811 Long term (current) use of aromatase inhibitors: Secondary | ICD-10-CM | POA: Diagnosis not present

## 2020-12-11 DIAGNOSIS — Z79899 Other long term (current) drug therapy: Secondary | ICD-10-CM | POA: Diagnosis not present

## 2020-12-11 DIAGNOSIS — Z170 Estrogen receptor positive status [ER+]: Secondary | ICD-10-CM

## 2020-12-11 DIAGNOSIS — G8929 Other chronic pain: Secondary | ICD-10-CM

## 2020-12-11 DIAGNOSIS — C50311 Malignant neoplasm of lower-inner quadrant of right female breast: Secondary | ICD-10-CM | POA: Diagnosis not present

## 2020-12-11 DIAGNOSIS — T451X5A Adverse effect of antineoplastic and immunosuppressive drugs, initial encounter: Secondary | ICD-10-CM | POA: Diagnosis not present

## 2020-12-11 DIAGNOSIS — G620 Drug-induced polyneuropathy: Secondary | ICD-10-CM | POA: Diagnosis not present

## 2020-12-11 DIAGNOSIS — E2839 Other primary ovarian failure: Secondary | ICD-10-CM | POA: Diagnosis not present

## 2020-12-11 DIAGNOSIS — Z923 Personal history of irradiation: Secondary | ICD-10-CM | POA: Diagnosis not present

## 2020-12-11 DIAGNOSIS — C50911 Malignant neoplasm of unspecified site of right female breast: Secondary | ICD-10-CM

## 2020-12-11 DIAGNOSIS — K219 Gastro-esophageal reflux disease without esophagitis: Secondary | ICD-10-CM | POA: Insufficient documentation

## 2020-12-11 LAB — COMPREHENSIVE METABOLIC PANEL
ALT: 18 U/L (ref 0–44)
AST: 21 U/L (ref 15–41)
Albumin: 4.1 g/dL (ref 3.5–5.0)
Alkaline Phosphatase: 101 U/L (ref 38–126)
Anion gap: 10 (ref 5–15)
BUN: 12 mg/dL (ref 6–20)
CO2: 26 mmol/L (ref 22–32)
Calcium: 9 mg/dL (ref 8.9–10.3)
Chloride: 104 mmol/L (ref 98–111)
Creatinine, Ser: 0.73 mg/dL (ref 0.44–1.00)
GFR, Estimated: >60 mL/min (ref >60)
Glucose, Bld: 159 mg/dL — ABNORMAL HIGH (ref 70–99)
Potassium: 3.3 mmol/L — ABNORMAL LOW (ref 3.5–5.1)
Sodium: 140 mmol/L (ref 135–145)
Total Bilirubin: 0.8 mg/dL (ref 0.3–1.2)
Total Protein: 7.6 g/dL (ref 6.5–8.1)

## 2020-12-11 LAB — CBC WITH DIFFERENTIAL/PLATELET
Abs Immature Granulocytes: 0.02 10*3/uL (ref 0.00–0.07)
Basophils Absolute: 0 10*3/uL (ref 0.0–0.1)
Basophils Relative: 1 %
Eosinophils Absolute: 0.3 10*3/uL (ref 0.0–0.5)
Eosinophils Relative: 4 %
HCT: 37.5 % (ref 36.0–46.0)
Hemoglobin: 12.6 g/dL (ref 12.0–15.0)
Immature Granulocytes: 0 %
Lymphocytes Relative: 43 %
Lymphs Abs: 2.8 10*3/uL (ref 0.7–4.0)
MCH: 28.6 pg (ref 26.0–34.0)
MCHC: 33.6 g/dL (ref 30.0–36.0)
MCV: 85 fL (ref 80.0–100.0)
Monocytes Absolute: 0.4 10*3/uL (ref 0.1–1.0)
Monocytes Relative: 6 %
Neutro Abs: 3.1 10*3/uL (ref 1.7–7.7)
Neutrophils Relative %: 46 %
Platelets: 256 10*3/uL (ref 150–400)
RBC: 4.41 MIL/uL (ref 3.87–5.11)
RDW: 12.4 % (ref 11.5–15.5)
WBC: 6.6 10*3/uL (ref 4.0–10.5)
nRBC: 0 % (ref 0.0–0.2)

## 2020-12-11 MED ORDER — ZOLEDRONIC ACID 4 MG/100ML IV SOLN
4.0000 mg | Freq: Once | INTRAVENOUS | Status: AC
  Administered 2020-12-11: 4 mg via INTRAVENOUS
  Filled 2020-12-11: qty 100

## 2020-12-11 MED ORDER — ANASTROZOLE 1 MG PO TABS: 1.0000 mg | ORAL_TABLET | Freq: Every day | ORAL | 1 refills | Status: DC

## 2020-12-11 MED ORDER — HEPARIN SOD (PORK) LOCK FLUSH 100 UNIT/ML IV SOLN
INTRAVENOUS | Status: AC
  Filled 2020-12-11: qty 5

## 2020-12-11 MED ORDER — HEPARIN SOD (PORK) LOCK FLUSH 100 UNIT/ML IV SOLN
500.0000 [IU] | Freq: Once | INTRAVENOUS | Status: AC
  Administered 2020-12-11: 500 [IU] via INTRAVENOUS
  Filled 2020-12-11: qty 5

## 2020-12-11 MED ORDER — SODIUM CHLORIDE 0.9 % IV SOLN
Freq: Once | INTRAVENOUS | Status: AC
  Filled 2020-12-11: qty 250

## 2020-12-11 MED ORDER — OMEPRAZOLE 20 MG PO CPDR: DELAYED_RELEASE_CAPSULE | ORAL | 0 refills | Status: DC

## 2020-12-11 MED ORDER — GOSERELIN ACETATE 3.6 MG ~~LOC~~ IMPL
3.6000 mg | DRUG_IMPLANT | Freq: Once | SUBCUTANEOUS | Status: AC
  Administered 2020-12-11: 3.6 mg via SUBCUTANEOUS
  Filled 2020-12-11: qty 3.6

## 2020-12-11 MED ORDER — FLUTICASONE PROPIONATE 50 MCG/ACT NA SUSP: 1.0000 | Freq: Every day | NASAL | 2 refills | Status: DC

## 2020-12-11 MED ORDER — SODIUM CHLORIDE 0.9% FLUSH
10.0000 mL | INTRAVENOUS | Status: DC | PRN
  Administered 2020-12-11: 10 mL via INTRAVENOUS
  Filled 2020-12-11: qty 10

## 2020-12-11 NOTE — Progress Notes (Signed)
Radiation Oncology Follow up Note  Name: Jessica [name]   Date:   12/11/2020 MRN:  [id number] DOB: September 20, 1976    This 45 y.o. 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  Seen today in routine follow-up she is doing well.  She specifically denies cough or bone pain she continues to have some slight breast tenderness which I have assured her is normal after lumpectomy..Marland Kitchen She was having headaches had an MRI of her brain showing no evidence of disease.  She had mammograms back in November also which I have reviewed as well as ultrasound again BI-RADS 2 benign.  She is currently on Arimidex tolerant well without side effect.  COMPLICATIONS OF TREATMENT: none  FOLLOW UP COMPLIANCE: keeps appointments   PHYSICAL EXAM:  Wt 215 lb (97.5 kg)   BMI 40.62 kg/m  Lungs are clear to A&P cardiac examination essentially unremarkable with regular rate and rhythm. No dominant mass or nodularity is noted in either breast in 2 positions examined. Incision is well-healed. No axillary or supraclavicular adenopathy is appreciated. Cosmetic result is excellent.  Well-developed well-nourished patient in NAD. HEENT reveals PERLA, EOMI, discs not visualized.  Oral cavity is clear. No oral mucosal lesions are identified. Neck is clear without evidence of cervical or supraclavicular adenopathy. Lungs are clear to A&P. Cardiac examination is essentially unremarkable with regular rate and rhythm without murmur rub or thrill. Abdomen is benign with no organomegaly or masses noted. Motor sensory and DTR levels are equal  and symmetric in the upper and lower extremities. Cranial nerves II through XII are grossly intact. Proprioception is intact. No peripheral adenopathy or edema is identified. No motor or sensory levels are noted. Crude visual fields are within normal range.  RADIOLOGY RESULTS: Mammogram ultrasound and MRI brain all reviewed compatible with above-stated findings  PLAN: Present time patient is doing well with no evidence of disease 6 months out.  Of asked to see her back in 6 months for follow-up.  She continues Arimidex without side effect.  Patient knows to call with any concerns at any time.  I would like to take this opportunity to thank you for allowing me to participate in the care of your patient..Noreene Filbert MD

## 2020-12-11 NOTE — Progress Notes (Signed)
Patient here for follow up. No new concerns, no new breast problems.

## 2020-12-11 NOTE — Progress Notes (Signed)
[place]   [phone] [phone]   Clinic Follow up Note   Patient Care Team: Sharyne Peach, MD as PCP - General (Family Medicine) Earlie Server, MD as Consulting Physician (Oncology) Benjamine Sprague, DO as Consulting Physician (Surgery) Rico Junker, RN as Registered Nurse Noreene Filbert, MD as Radiation Oncologist (Radiation Oncology)  Date of Service:  12/11/2020  CHIEF COMPLAINT: F/u of right breast cancer   SUMMARY OF ONCOLOGIC HISTORY: Oncology History Overview Note  Cancer Staging Malignant neoplasm of lower-inner quadrant of right breast of female, estrogen receptor positive ([place]) Staging form: Breast, AJCC 8th Edition - Clinical stage from 07/26/2019: Stage IIB (cT2, cN1, cM0, G3, ER+, PR+, HER2-) - Unsigned    Malignant neoplasm of lower-inner quadrant of right breast of female, estrogen receptor positive ([place])  07/20/2019 Mammogram   Mammogram 07/20/19  IMPRESSION: Suspicious palpable right breast mass 3.4 x 2.5 x 3.4 cm 5 o'clock position 3 cm from nipple.   Suspicious 1.6cm cortically thickened right axillary lymph node.   07/26/2019 Initial Diagnosis   DIAGNOSIS: 07/26/19 A. RIGHT BREAST, 5:00, 3CMFN; ULTRASOUND-GUIDED NEEDLE CORE BIOPSY:  - INVASIVE MAMMARY CARCINOMA, NO SPECIAL TYPE.    07/26/2019 Receptors her2   BREAST BIOMARKER TESTS  Estrogen Receptor (ER) Status: POSITIVE                       Percentage of cells with nuclear positivity: 51-90%                       Average intensity of staining: Strong   Progesterone Receptor (PgR) Status: POSITIVE                       Percentage of cells with nuclear positivity:  Greater than 90%                       Average intensity of staining: Strong   HER2 (by immunohistochemistry): NEGATIVE (Score 1+)    08/05/2019 Initial Diagnosis   Invasive carcinoma of breast ([place])   08/17/2019 Surgery   INSERTION PORT-A-CATH by Dr. Lysle Pearl 08/17/19    08/18/2019 -  Chemotherapy    AC q2weeks for 4 cycles starting 08/18/19 followed by weekly Taxol for 12 weeks     Genetic Testing   Negative genetic testing. No pathogenic variants identified on the [facility] Common Hereditary Cancers Panel. The report date is 11/13/2019.   The Common Hereditary Cancers Panel offered by [facility] includes sequencing and/or deletion duplication testing of the following 48 genes: APC, ATM, AXIN2, BARD1, BMPR1A, BRCA1, BRCA2, BRIP1, CDH1, CDKN2A (p14ARF), CDKN2A (p16INK4a), CKD4, CHEK2, CTNNA1, DICER1, EPCAM (Deletion/duplication testing only), GREM1 (promoter region deletion/duplication testing only), KIT, MEN1, MLH1, MSH2, MSH3, MSH6, MUTYH, NBN, NF1, NHTL1, PALB2, PDGFRA, PMS2, POLD1, POLE, PTEN, RAD50, RAD51C, RAD51D, RNF43, SDHB, SDHC, SDHD, SMAD4, SMARCA4. STK11, TP53, TSC1, TSC2, and VHL.  The following genes were evaluated for sequence changes only: SDHA and HOXB13 c.251G>A variant only.      CANCER THERAPY:  Neoadjuvant chemo ddAC starting 08/18/19 for 4 cycles followed by weekly Taxol x 12 Finished chemotherapy on 01/01/2020.   02/01/2020 s/p right lumpectomy and axillary lymph node dissection. ypT1c ypN1a(sn)  ER/PR positive, HER2 negative, residual invasive mammary carcinoma, grade 3.  1/3 sentinel lymph node positive and 2/10 right axillary lymph nodes positive.  DCIS present. Margin was negative for invasive carcinoma and DCIS Finished adjuvant radiation on 04/30/2020 #  July 2021 started ovarian suppression with Zoladex monthly, started Arimidex 1 mg daily.  INTERVAL HISTORY:  Jessica [name] presents to follow-up for breast cancer. Patient is on ovarian suppression with Zoladex monthly and Arimidex 1 mg daily  Patient reports manageable side effects.  Including hot flash, some stiffness and joint aches. Continues to have headache which usually relieves with Tylenol.  Sinus congestion.   Review of Systems  Constitutional: Positive for fatigue. Negative for appetite change,  chills and fever.  HENT:   Negative for hearing loss and voice change.   Eyes: Negative for eye problems.  Respiratory: Negative for chest tightness and cough.   Cardiovascular: Negative for chest pain.  Gastrointestinal: Negative for abdominal distention, abdominal pain and blood in stool.  Endocrine: Negative for hot flashes.  Genitourinary: Negative for difficulty urinating and frequency.   Musculoskeletal: Negative for arthralgias.       Bilateral lower extremity thigh pain, left worse than right.  Skin: Negative for itching and rash.  Neurological: Positive for headaches. Negative for extremity weakness and numbness.  Hematological: Negative for adenopathy.  Psychiatric/Behavioral: Negative for confusion and sleep disturbance.    MEDICAL HISTORY:  Past Medical History:  Diagnosis Date  . Anemia   . Arthritis    left ankle  . Breast cancer ([place]) 07/23/2019   right breast- [facility]  . Cancer [facility])    breast cancer  . Family history of cervical cancer   . Family history of lung cancer   . Frequency   . GERD (gastroesophageal reflux disease)   . History of kidney stones    h/o  . Obesity   . Personal history of chemotherapy   . Right flank pain     SURGICAL HISTORY: Past Surgical History:  Procedure Laterality Date  . AXILLARY LYMPH NODE DISSECTION Right 02/01/2020   Procedure: AXILLARY LYMPH NODE DISSECTION;  Surgeon: Benjamine Sprague, DO;  Location: [facility] ORS;  Service: General;  Laterality: Right;  . BREAST BIOPSY Right 2020   [facility]  . BREAST LUMPECTOMY Right 02/01/2020   pt has neo adj chemo and post radation with LN removed  . CESAREAN SECTION     x 2  . CHOLECYSTECTOMY    . DILATION AND CURETTAGE OF UTERUS    . LITHOTRIPSY    . PORTACATH PLACEMENT Left 08/17/2019   Procedure: INSERTION PORT-A-CATH;  Surgeon: Benjamine Sprague, DO;  Location: [facility] ORS;  Service: General;  Laterality: Left;  . TUBAL LIGATION      I have reviewed the social history and family history with the  patient and they are unchanged from previous note.  ALLERGIES:  has No Known Allergies.  MEDICATIONS:  Current Outpatient Medications  Medication Sig Dispense Refill  . anastrozole (ARIMIDEX) 1 MG tablet TAKE 1 TABLET BY MOUTH EVERY DAY 30 tablet 0  . ibuprofen (ADVIL) 800 MG tablet Take 1 tablet (800 mg total) by mouth every 8 (eight) hours as needed for mild pain or moderate pain. 30 tablet 0  . lidocaine-prilocaine (EMLA) cream Apply 1 application topically as needed. Apply small amount to port site 1-2 hours prior to appointment. 30 g 2  . Dermatological Products, Misc. (STRATA XRT) GEL Apply topically. (Patient not taking: No sig reported)    . ferrous sulfate 325 (65 FE) MG EC tablet TAKE 1 TABLET (325 MG TOTAL) BY MOUTH 2 (TWO) TIMES DAILY WITH A MEAL. (Patient not taking: No sig reported) 60 tablet 1  . gabapentin (NEURONTIN) 600 MG tablet Take 600 mg by  mouth 3 (three) times daily. (Patient not taking: No sig reported)    . HYDROcodone-acetaminophen (NORCO) 5-325 MG tablet Take 1 tablet by mouth every 6 (six) hours as needed for up to 15 doses for moderate pain. (Patient not taking: No sig reported) 6 tablet 0  . omeprazole (PRILOSEC) 20 MG capsule TAKE 1 CAPSULE BY MOUTH EVERY DAY (Patient not taking: Reported on 12/11/2020) 90 capsule 0   No current facility-administered medications for this visit.   Facility-Administered Medications Ordered in Other Visits  Medication Dose Route Frequency Provider Last Rate Last Admin  . heparin lock flush 100 unit/mL  500 Units Intravenous Once Earlie Server, MD      . sodium chloride flush (NS) 0.9 % injection 10 mL  10 mL Intravenous Once Earlie Server, MD      . sodium chloride flush (NS) 0.9 % injection 10 mL  10 mL Intravenous PRN Earlie Server, MD   10 mL at 12/11/20 1336    PHYSICAL EXAMINATION: ECOG PERFORMANCE STATUS: 1 - Symptomatic but completely ambulatory  Vitals:   12/11/20 1358  BP: 121/84  Pulse: (!) 101  Resp: 18  Temp: 98 F (36.7 C)    Filed Weights   12/11/20 1358  Weight: 215 lb 12.8 oz (97.9 kg)   Physical Exam Constitutional:      General: She is not in acute distress.    Appearance: She is not diaphoretic.  HENT:     Head: Normocephalic and atraumatic.     Nose: Nose normal.     Mouth/Throat:     Pharynx: No oropharyngeal exudate.  Eyes:     General: No scleral icterus.    Pupils: Pupils are equal, round, and reactive to light.  Cardiovascular:     Rate and Rhythm: Normal rate and regular rhythm.     Heart sounds: No murmur heard.   Pulmonary:     Effort: Pulmonary effort is normal. No respiratory distress.     Breath sounds: No rales.  Chest:     Chest wall: No tenderness.  Abdominal:     General: There is no distension.     Palpations: Abdomen is soft.     Tenderness: There is no abdominal tenderness.  Musculoskeletal:        General: Normal range of motion.     Cervical back: Normal range of motion and neck supple.  Skin:    General: Skin is warm and dry.     Findings: No erythema.  Neurological:     Mental Status: She is alert and oriented to person, place, and time.     Cranial Nerves: No cranial nerve deficit.     Motor: No abnormal muscle tone.     Coordination: Coordination normal.  Psychiatric:        Mood and Affect: Affect normal.      LABORATORY DATA:  I have reviewed the data as listed CBC Latest Ref Rng & Units 12/11/2020 09/09/2020 06/06/2020  WBC 4.0 - 10.5 K/uL 6.6 6.6 6.7  Hemoglobin 12.0 - 15.0 g/dL 12.6 12.3 12.5  Hematocrit 36.0 - 46.0 % 37.5 36.6 36.4  Platelets 150 - 400 K/uL 256 271 257     CMP Latest Ref Rng & Units 09/09/2020 06/06/2020 06/06/2020  Glucose 70 - 99 mg/dL 97 102(H) 114(H)  BUN 6 - 20 mg/dL '12 13 9  ' Creatinine 0.44 - 1.00 mg/dL 0.59 0.78 0.69  Sodium 135 - 145 mmol/L 138 141 141  Potassium 3.5 - 5.1 mmol/L 3.5  3.7 3.9  Chloride 98 - 111 mmol/L 105 103 104  CO2 22 - 32 mmol/L '27 28 28  ' Calcium 8.9 - 10.3 mg/dL 8.8(L) 8.9 9.0  Total Protein 6.5 -  8.1 g/dL 7.3 - 7.4  Total Bilirubin 0.3 - 1.2 mg/dL 0.6 - 0.7  Alkaline Phos 38 - 126 U/L 74 - 105  AST 15 - 41 U/L 17 - 21  ALT 0 - 44 U/L 17 - 20      RADIOGRAPHIC STUDIES: I have personally reviewed the radiological images as listed and agreed with the findings in the report. MR Brain W Wo Contrast  Result Date: 09/22/2020 CLINICAL DATA:  History of breast cancer.  Headaches. EXAM: MRI HEAD WITHOUT AND WITH CONTRAST TECHNIQUE: Multiplanar, multiecho pulse sequences of the brain and surrounding structures were obtained without and with intravenous contrast. CONTRAST:  15m GADAVIST GADOBUTROL 1 MMOL/ML IV SOLN COMPARISON:  None. FINDINGS: Brain: No acute infarction, hemorrhage, hydrocephalus, extra-axial collection or mass lesion. No focus of abnormal contrast enhancement to suggest metastatic disease to the brain. Vascular: Normal flow voids. Skull and upper cervical spine: No focal bone lesion identified. Sinuses/Orbits: Small mucous retention cyst in the bilateral maxillary sinuses. The orbits are maintained. Other: None. IMPRESSION: 1. No evidence of intracranial metastatic disease. 2. Unremarkable MRI of the brain. Electronically Signed   By: KPedro EarlsM.D.   On: 09/22/2020 17:27   [place]ast Limited Uni Right Inc Axilla  Result Date: 09/20/2020 CLINICAL DATA:  History of RIGHT lumpectomy with radiation therapy in March 2021 after neoadjuvant treatment. Patient was first diagnosed with RIGHT breast cancer in September 2020. She reports pain in the RIGHT UPPER OUTER chest wall, RIGHT axilla, and LOWER portion of the RIGHT breast. EXAM: DIGITAL DIAGNOSTIC BILATERAL MAMMOGRAM WITH CAD AND TOMO ULTRASOUND RIGHT BREAST COMPARISON:  Previous exam(s). ACR Breast Density Category b: There are scattered areas of fibroglandular density. FINDINGS: Postoperative changes are identified in the LOWER INNER QUADRANT of the RIGHT breast. There is mild skin and trabecular thickening from  interval radiation treatment. No suspicious mass, distortion, or microcalcifications are identified in either breast. Mammographic images were processed with CAD. On physical exam, there is a well-healed RIGHT axillary scar, without palpable mass. There is a well-healed scar at 6 o'clock, without associated palpable mass. I palpate no abnormality in the UPPER INNER QUADRANT of the RIGHT breast in the area of tenderness. Patient has a LEFT-sided Port-A-Cath. Targeted ultrasound is performed, showing normal appearing fibroglandular tissue in all of the areas of patient's concern. Expected postoperative edema identified in the 6 o'clock location. No enlarged lymph nodes or other mass identified. IMPRESSION: No mammographic or ultrasound evidence for malignancy. Expected post treatment changes in the RIGHT breast. RECOMMENDATION: Diagnostic mammogram is suggested in 1 year. (Code:DM-B-01Y) I have discussed the findings and recommendations with the patient. If applicable, a reminder letter will be sent to the patient regarding the next appointment. BI-RADS CATEGORY  2: Benign. Electronically Signed   By: ENolon NationsM.D.   On: 09/20/2020 12:51   MM DIAG BREAST TOMO BILATERAL  Result Date: 09/20/2020 CLINICAL DATA:  History of RIGHT lumpectomy with radiation therapy in March 2021 after neoadjuvant treatment. Patient was first diagnosed with RIGHT breast cancer in September 2020. She reports pain in the RIGHT UPPER OUTER chest wall, RIGHT axilla, and LOWER portion of the RIGHT breast. EXAM: DIGITAL DIAGNOSTIC BILATERAL MAMMOGRAM WITH CAD AND TOMO ULTRASOUND RIGHT BREAST COMPARISON:  Previous exam(s). ACR Breast Density Category  b: There are scattered areas of fibroglandular density. FINDINGS: Postoperative changes are identified in the LOWER INNER QUADRANT of the RIGHT breast. There is mild skin and trabecular thickening from interval radiation treatment. No suspicious mass, distortion, or microcalcifications are  identified in either breast. Mammographic images were processed with CAD. On physical exam, there is a well-healed RIGHT axillary scar, without palpable mass. There is a well-healed scar at 6 o'clock, without associated palpable mass. I palpate no abnormality in the UPPER INNER QUADRANT of the RIGHT breast in the area of tenderness. Patient has a LEFT-sided Port-A-Cath. Targeted ultrasound is performed, showing normal appearing fibroglandular tissue in all of the areas of patient's concern. Expected postoperative edema identified in the 6 o'clock location. No enlarged lymph nodes or other mass identified. IMPRESSION: No mammographic or ultrasound evidence for malignancy. Expected post treatment changes in the RIGHT breast. RECOMMENDATION: Diagnostic mammogram is suggested in 1 year. (Code:[id number]-B-01Y) I have discussed the findings and recommendations with the patient. If applicable, a reminder letter will be sent to the patient regarding the next appointment. BI-RADS CATEGORY  2: Benign. Electronically Signed   By: Nolon Nations M.D.   On: 09/20/2020 12:51     ASSESSMENT & PLAN:  1. Malignant neoplasm of lower-inner quadrant of right breast of female, estrogen receptor positive ([place])   2. Suppression of ovarian secretion   3. Aromatase inhibitor use   4. Port-A-Cath in place   5. Chronic nonintractable headache, unspecified headache type     Invasive right breast cancer,  Cancer Staging Malignant neoplasm of lower-inner quadrant of right breast of female, estrogen receptor positive ([place]) Staging form: Breast, AJCC 8th Edition - Clinical stage from 07/26/2019: Stage IIB (cT2, cN1, cM0, G3, ER+, PR+, HER2-) - Unsigned -Status post 4 cycles of dose dense Adriamycin and Cytoxan, s/p weekly Taxol x 12.  Pathology ypT1c ypN1a(sn)  04/30/2020 finished adjuvant radiation. Patient is premenopausal. Continue to adjuvant antiestrogen treatment with Arimidex 1 mg daily as well as monthly Zoladex for ovarian  suppression. Bilateral diagnostic mammogram due in November 2022 I will check tumor marker at the next visit.  CA 15.3, CA 27-29 Continue monthly Zoladex.  Persistent chronic headache, 09/30/2020 MRI brain is negative for intracranial metastasis..  Small mucous retention cyst in the bilateral maxillary sinuses.  Questionable chronic sinusitis which may lead to her chronic headache.  Recommend patient to try intranasal Flonase as needed.  Follow-up with primary care provider for further management if symptoms persist.  #Continue adjuvant Zometa every 6 months.  Proceed with Zometa today..  Recommend patient to continue vitamin D and calcium supplementation.  #Neuropathy due to chemotherapy, pre-existing sciatica, symptom has resolved.  She no longer takes gabapentin.  I will remove medication from her medication list.  ##Port-A-Cath in place, recommend to continue port flush every  8 weeks and keep for at least 1 year after treatment.  She agrees.  She would like to take out Mediport in June 2022..- Dr. Lysle Pearl  Follow-up: monthly Zoledex, follow up lab MD Zoladex in 3 months.   All questions were answered. The patient knows to call the clinic with any problems, questions or concerns. No barriers to learning was detected.     Earlie Server, MD 12/11/2020

## 2020-12-11 NOTE — Progress Notes (Signed)
1510- Patient tolerated treatment well. Patient stable and discharged home at this time.

## 2020-12-12 ENCOUNTER — Encounter: Payer: Self-pay | Admitting: Oncology

## 2021-01-08 ENCOUNTER — Inpatient Hospital Stay: Payer: BC Managed Care – PPO | Attending: Oncology

## 2021-01-08 ENCOUNTER — Inpatient Hospital Stay: Payer: BC Managed Care – PPO

## 2021-01-08 ENCOUNTER — Other Ambulatory Visit: Payer: Self-pay

## 2021-01-08 DIAGNOSIS — C50311 Malignant neoplasm of lower-inner quadrant of right female breast: Secondary | ICD-10-CM | POA: Diagnosis not present

## 2021-01-08 DIAGNOSIS — Z170 Estrogen receptor positive status [ER+]: Secondary | ICD-10-CM | POA: Insufficient documentation

## 2021-01-08 DIAGNOSIS — Z79811 Long term (current) use of aromatase inhibitors: Secondary | ICD-10-CM | POA: Insufficient documentation

## 2021-01-08 MED ORDER — GOSERELIN ACETATE 3.6 MG ~~LOC~~ IMPL
3.6000 mg | DRUG_IMPLANT | Freq: Once | SUBCUTANEOUS | Status: AC
  Administered 2021-01-08: 3.6 mg via SUBCUTANEOUS
  Filled 2021-01-08: qty 3.6

## 2021-01-31 ENCOUNTER — Other Ambulatory Visit: Payer: Self-pay | Admitting: Obstetrics and Gynecology

## 2021-02-05 ENCOUNTER — Other Ambulatory Visit: Payer: Self-pay

## 2021-02-05 ENCOUNTER — Inpatient Hospital Stay: Payer: BC Managed Care – PPO | Attending: Oncology

## 2021-02-05 VITALS — BP 118/79 | HR 73 | Temp 98.2°F | Resp 16

## 2021-02-05 DIAGNOSIS — Z79818 Long term (current) use of other agents affecting estrogen receptors and estrogen levels: Secondary | ICD-10-CM | POA: Diagnosis not present

## 2021-02-05 DIAGNOSIS — C50311 Malignant neoplasm of lower-inner quadrant of right female breast: Secondary | ICD-10-CM | POA: Diagnosis not present

## 2021-02-05 DIAGNOSIS — Z170 Estrogen receptor positive status [ER+]: Secondary | ICD-10-CM | POA: Diagnosis not present

## 2021-02-05 MED ORDER — GOSERELIN ACETATE 3.6 MG ~~LOC~~ IMPL
3.6000 mg | DRUG_IMPLANT | Freq: Once | SUBCUTANEOUS | Status: AC
Start: 2021-02-05 — End: 2021-02-05
  Administered 2021-02-05: 3.6 mg via SUBCUTANEOUS
  Filled 2021-02-05: qty 3.6

## 2021-02-24 DIAGNOSIS — Z8049 Family history of malignant neoplasm of other genital organs: Secondary | ICD-10-CM | POA: Insufficient documentation

## 2021-02-24 DIAGNOSIS — Z801 Family history of malignant neoplasm of trachea, bronchus and lung: Secondary | ICD-10-CM | POA: Insufficient documentation

## 2021-03-04 NOTE — H&P (Signed)
Jessica [name] is a premenopausal 45 y.o. [phone] female presenting with Pre Op Consulting  Body mass index is 39.76 kg/m.   History of Present Illness: Patient presents for a preoperative visit for [place], [place]. Surgical indications: desires hysterectomy due to dx of Invasive ER+, PR+ right breast cancer. Patient had planned to consult with her oncologist regarding BSO vs BS, which she has discussed with them. Patient requests a total hysterectomy with bilateral salpingo oophorectomy.  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  -02/2020: s/p R lumpectomy and axillary lymph node dissection               -ER/PR positive, HER2 negative, residual invasive mammary carcinoma, grade 3.                -1/3 sentinel lymph node positive and 2/10 right axillary lymph nodes positive.  -04/30/2020 finished adjuvant radiation. -05/2020: started adjuvant antiestrogen treatment with Arimidex 1 mg daily as well as monthly Zoladex for ovarian suppression. -Bilateral diagnostic mammogram due in 09/2021  Workup: Pap: 12/2020 neg/neg EMBx: 03/2018: Scant proliferative endometrium, mixed with minute fragments of benign endocervical glands, mucus, and blood. No hyperplasia or carcinoma. TVUS 03/2018:  Endovaginal view: On color assay, the myometrium shows abundant flow perpendicular to the endometrium. The endometrial stripe measures 12 mm. The ovaries appear normal.  No free fluid is seen in the pelvis.   Surgical Hx: -C/S x2 -S/p BTL  -S/p D&C  Also, fhx of ovarian cancer in mother.  Past Medical History:  has a past medical history of Abnormal uterine bleeding (2018), Anemia (1993), Encounter for blood transfusion (1995), GERD (gastroesophageal reflux disease), History of cancer, and Kidney stone.  Past  Surgical History:  has a past surgical history that includes Cholecystectomy; Tubal ligation; Cesarean section (1993/1998); port a cath; and Mastectomy (Right, 02/01/2020). Family History: family history includes Ovarian cancer in her mother; Thyroid disease in her mother; Urolithiasis in her mother. Social History:  reports that she has quit smoking. She has a 0.25 pack-year smoking history. She has never used smokeless tobacco. She reports current alcohol use. She reports that she does not use drugs. OB/GYN History:          OB History    Gravida  4   Para  2   Term  2   Preterm      AB  2   Living  2     SAB      IAB  2   Ectopic      Molar      Multiple      Live Births           Allergies: has No Known Allergies. Medications: Current Outpatient Medications:  .  anastrozole (ARIMIDEX) 1 mg tablet, Take by mouth, Disp: , Rfl:  .  ferrous sulfate 325 (65 FE) MG EC tablet, TAKE 1 TABLET (325 MG TOTAL) BY MOUTH 2 (TWO) TIMES DAILY WITH A MEAL. (Patient not taking: No sig reported), Disp: , Rfl:  .  fluticasone propionate (FLONASE) 50 mcg/actuation nasal spray, SPRAY 1 SPRAY INTO BOTH NOSTRILS DAILY., Disp: , Rfl:  .  gabapentin (NEURONTIN) 600 MG tablet, Take 600 mg by mouth 3 (three) times daily (Patient not taking: No sig reported),  Disp: , Rfl:  .  goserelin (ZOLADEX) 3.6 mg injection, Inject 3.6 mg subcutaneously every 28 (twenty-eight) days, Disp: , Rfl:  .  ibuprofen (MOTRIN) 800 MG tablet, Take by mouth as needed, Disp: , Rfl:  .  lidocaine-prilocaine (EMLA) cream, APPLY TO AFFECTED AREA ONCE AS DIRECTED, Disp: , Rfl:  .  omeprazole (PRILOSEC) 20 MG DR capsule, Take 20 mg by mouth once daily, Disp: , Rfl:   Review of Systems: No SOB, no palpitations or chest pain, no new lower extremity edema, no nausea or vomiting or bowel or bladder complaints. See HPI for gyn specific ROS.   Exam:   BP 124/80   Ht 157.5 cm ('5\' 2"' )   Wt 98.6 kg (217 lb 6.4 oz)    LMP 08/16/2020 (Exact Date)   BMI 39.76 kg/m   Constitutional:  General appearance: Well nourished, well developed female in no acute distress.  CV: RRR Pulm: CTAB Neuro/psych:  Normal mood and affect. No gross motor deficits. Neck:  Supple, normal appearance.  Respiratory:  Normal respiratory effort, no use of accessory muscles Skin:  No visible rashes or external lesions  Impression:   The encounter diagnosis was Preoperative clearance.  Plan:   1. Preoperative Visit  -Patient returns for a preoperative discussion regarding her plans to proceed with surgical treatment of her request for hysterectomy & oophorectomy in context of dx of Invasive ER+, PR+ right breast cancer by total laparoscopic hysterectomy with bilateral salpingooophorectomy procedure. We may perform a cystoscopy to evaluate the urinary tract after the procedure, if surgically indicated for uro tract integrity.   The patient and I discussed the technical aspects of the procedure including the potential for risks and complications. These include but are not limited to the risk of infection requiring post-operative antibiotics or further procedures. We talked about the risk of injury to adjacent organs including bladder, bowel, ureter, blood vessels or nerves. We talked about the need to convert to an open incision. We talked about the possible need for blood transfusion. We talked aboutpostop complications such asthromboembolic or cardiopulmonary complications. All of her questions were answered. Her preoperative exam was completed and the appropriate consents were signed.  Surgery is scheduled for 03/21/2021.  Specific Peri-operative Considerations:  - Consent: obtained today - Health Maintenance: up to date - Labs: CBC, CMP preoperatively - Studies: EKG, CXR preoperatively - Bowel Preparation: None required - Abx:  Ancef 2g - VTE ppx: SCDs perioperatively

## 2021-03-07 ENCOUNTER — Telehealth: Payer: Self-pay

## 2021-03-07 DIAGNOSIS — Z170 Estrogen receptor positive status [ER+]: Secondary | ICD-10-CM

## 2021-03-07 DIAGNOSIS — C50311 Malignant neoplasm of lower-inner quadrant of right female breast: Secondary | ICD-10-CM

## 2021-03-07 NOTE — Telephone Encounter (Signed)
Open in error

## 2021-03-10 ENCOUNTER — Other Ambulatory Visit: Payer: Self-pay

## 2021-03-10 ENCOUNTER — Inpatient Hospital Stay: Payer: BC Managed Care – PPO | Attending: Oncology

## 2021-03-10 DIAGNOSIS — G620 Drug-induced polyneuropathy: Secondary | ICD-10-CM | POA: Diagnosis not present

## 2021-03-10 DIAGNOSIS — R5383 Other fatigue: Secondary | ICD-10-CM | POA: Insufficient documentation

## 2021-03-10 DIAGNOSIS — Z95828 Presence of other vascular implants and grafts: Secondary | ICD-10-CM

## 2021-03-10 DIAGNOSIS — Z9221 Personal history of antineoplastic chemotherapy: Secondary | ICD-10-CM | POA: Diagnosis not present

## 2021-03-10 DIAGNOSIS — Z170 Estrogen receptor positive status [ER+]: Secondary | ICD-10-CM | POA: Insufficient documentation

## 2021-03-10 DIAGNOSIS — T451X5A Adverse effect of antineoplastic and immunosuppressive drugs, initial encounter: Secondary | ICD-10-CM | POA: Diagnosis not present

## 2021-03-10 DIAGNOSIS — C50311 Malignant neoplasm of lower-inner quadrant of right female breast: Secondary | ICD-10-CM | POA: Diagnosis not present

## 2021-03-10 DIAGNOSIS — Z79899 Other long term (current) drug therapy: Secondary | ICD-10-CM | POA: Diagnosis not present

## 2021-03-10 DIAGNOSIS — Z79811 Long term (current) use of aromatase inhibitors: Secondary | ICD-10-CM | POA: Diagnosis not present

## 2021-03-10 LAB — COMPREHENSIVE METABOLIC PANEL
ALT: 19 U/L (ref 0–44)
AST: 21 U/L (ref 15–41)
Albumin: 3.9 g/dL (ref 3.5–5.0)
Alkaline Phosphatase: 70 U/L (ref 38–126)
Anion gap: 9 (ref 5–15)
BUN: 10 mg/dL (ref 6–20)
CO2: 27 mmol/L (ref 22–32)
Calcium: 9 mg/dL (ref 8.9–10.3)
Chloride: 103 mmol/L (ref 98–111)
Creatinine, Ser: 0.82 mg/dL (ref 0.44–1.00)
GFR, Estimated: >60 mL/min (ref >60)
Glucose, Bld: 124 mg/dL — ABNORMAL HIGH (ref 70–99)
Potassium: 3.5 mmol/L (ref 3.5–5.1)
Sodium: 139 mmol/L (ref 135–145)
Total Bilirubin: 0.6 mg/dL (ref 0.3–1.2)
Total Protein: 7.2 g/dL (ref 6.5–8.1)

## 2021-03-10 LAB — CBC WITH DIFFERENTIAL/PLATELET
Abs Immature Granulocytes: 0.01 10*3/uL (ref 0.00–0.07)
Basophils Absolute: 0 10*3/uL (ref 0.0–0.1)
Basophils Relative: 1 %
Eosinophils Absolute: 0.3 10*3/uL (ref 0.0–0.5)
Eosinophils Relative: 3 %
HCT: 37.9 % (ref 36.0–46.0)
Hemoglobin: 12.5 g/dL (ref 12.0–15.0)
Immature Granulocytes: 0 %
Lymphocytes Relative: 39 %
Lymphs Abs: 3.1 10*3/uL (ref 0.7–4.0)
MCH: 28.7 pg (ref 26.0–34.0)
MCHC: 33 g/dL (ref 30.0–36.0)
MCV: 86.9 fL (ref 80.0–100.0)
Monocytes Absolute: 0.5 10*3/uL (ref 0.1–1.0)
Monocytes Relative: 7 %
Neutro Abs: 3.9 10*3/uL (ref 1.7–7.7)
Neutrophils Relative %: 50 %
Platelets: 286 10*3/uL (ref 150–400)
RBC: 4.36 MIL/uL (ref 3.87–5.11)
RDW: 12.7 % (ref 11.5–15.5)
WBC: 7.8 10*3/uL (ref 4.0–10.5)
nRBC: 0 % (ref 0.0–0.2)

## 2021-03-10 MED ORDER — SODIUM CHLORIDE 0.9% FLUSH
10.0000 mL | Freq: Once | INTRAVENOUS | Status: AC
  Administered 2021-03-10: 10 mL via INTRAVENOUS
  Filled 2021-03-10: qty 10

## 2021-03-10 MED ORDER — HEPARIN SOD (PORK) LOCK FLUSH 100 UNIT/ML IV SOLN
500.0000 [IU] | Freq: Once | INTRAVENOUS | Status: AC
  Administered 2021-03-10: 500 [IU] via INTRAVENOUS
  Filled 2021-03-10: qty 5

## 2021-03-11 ENCOUNTER — Encounter: Payer: Self-pay | Admitting: Oncology

## 2021-03-11 ENCOUNTER — Inpatient Hospital Stay (HOSPITAL_BASED_OUTPATIENT_CLINIC_OR_DEPARTMENT_OTHER): Payer: BC Managed Care – PPO | Admitting: Oncology

## 2021-03-11 ENCOUNTER — Inpatient Hospital Stay: Payer: BC Managed Care – PPO

## 2021-03-11 VITALS — BP 117/86 | HR 76 | Temp 98.2°F | Resp 16 | Wt 219.1 lb

## 2021-03-11 DIAGNOSIS — Z79811 Long term (current) use of aromatase inhibitors: Secondary | ICD-10-CM | POA: Diagnosis not present

## 2021-03-11 DIAGNOSIS — C50311 Malignant neoplasm of lower-inner quadrant of right female breast: Secondary | ICD-10-CM | POA: Diagnosis not present

## 2021-03-11 DIAGNOSIS — E2839 Other primary ovarian failure: Secondary | ICD-10-CM | POA: Diagnosis not present

## 2021-03-11 DIAGNOSIS — Z170 Estrogen receptor positive status [ER+]: Secondary | ICD-10-CM

## 2021-03-11 DIAGNOSIS — Z95828 Presence of other vascular implants and grafts: Secondary | ICD-10-CM | POA: Diagnosis not present

## 2021-03-11 LAB — CANCER ANTIGEN 15-3: CA 15-3: 16 U/mL (ref 0.0–25.0)

## 2021-03-11 LAB — CANCER ANTIGEN 27.29: CA 27.29: 19.1 U/mL (ref 0.0–38.6)

## 2021-03-11 MED ORDER — GOSERELIN ACETATE 3.6 MG ~~LOC~~ IMPL
3.6000 mg | DRUG_IMPLANT | Freq: Once | SUBCUTANEOUS | Status: AC
  Administered 2021-03-11: 3.6 mg via SUBCUTANEOUS
  Filled 2021-03-11: qty 3.6

## 2021-03-11 NOTE — Progress Notes (Signed)
Patient denies new problems/concerns today.   °

## 2021-03-11 NOTE — Progress Notes (Signed)
[facility]   Telephone:(336) [phone] Fax:(336) [phone]   Clinic Follow up Note   Patient Care Team: Sharyne Peach, MD as PCP - General (Family Medicine) Earlie Server, MD as Consulting Physician (Oncology) Benjamine Sprague, DO as Consulting Physician (Surgery) Rico Junker, RN as Registered Nurse Noreene Filbert, MD as Radiation Oncologist (Radiation Oncology)  Date of Service:  03/11/2021  CHIEF COMPLAINT: F/u of right breast cancer   SUMMARY OF ONCOLOGIC HISTORY: Oncology History Overview Note  Cancer Staging Malignant neoplasm of lower-inner quadrant of right breast of female, estrogen receptor positive ([place]) Staging form: Breast, AJCC 8th Edition - Clinical stage from 07/26/2019: Stage IIB (cT2, cN1, cM0, G3, ER+, PR+, HER2-) - Unsigned    Malignant neoplasm of lower-inner quadrant of right breast of female, estrogen receptor positive ([place])  07/20/2019 Mammogram   Mammogram 07/20/19  IMPRESSION: Suspicious palpable right breast mass 3.4 x 2.5 x 3.4 cm 5 o'clock position 3 cm from nipple.   Suspicious 1.6cm cortically thickened right axillary lymph node.   07/26/2019 Initial Diagnosis   DIAGNOSIS: 07/26/19 A. RIGHT BREAST, 5:00, 3CMFN; ULTRASOUND-GUIDED NEEDLE CORE BIOPSY:  - INVASIVE MAMMARY CARCINOMA, NO SPECIAL TYPE.    07/26/2019 Receptors her2   BREAST BIOMARKER TESTS  Estrogen Receptor (ER) Status: POSITIVE                       Percentage of cells with nuclear positivity: 51-90%                       Average intensity of staining: Strong   Progesterone Receptor (PgR) Status: POSITIVE                       Percentage of cells with nuclear positivity:  Greater than 90%                       Average intensity of staining: Strong   HER2 (by immunohistochemistry): NEGATIVE (Score 1+)    08/05/2019 Initial Diagnosis   Invasive carcinoma of breast ([place])   08/17/2019 Surgery   INSERTION PORT-A-CATH by Dr. Lysle Pearl 08/17/19    08/18/2019 -  Chemotherapy    AC q2weeks for 4 cycles starting 08/18/19 followed by weekly Taxol for 12 weeks     Genetic Testing   Negative genetic testing. No pathogenic variants identified on the [facility] Common Hereditary Cancers Panel. The report date is 11/13/2019.   The Common Hereditary Cancers Panel offered by [facility] includes sequencing and/or deletion duplication testing of the following 48 genes: APC, ATM, AXIN2, BARD1, BMPR1A, BRCA1, BRCA2, BRIP1, CDH1, CDKN2A (p14ARF), CDKN2A (p16INK4a), CKD4, CHEK2, CTNNA1, DICER1, EPCAM (Deletion/duplication testing only), GREM1 (promoter region deletion/duplication testing only), KIT, MEN1, MLH1, MSH2, MSH3, MSH6, MUTYH, NBN, NF1, NHTL1, PALB2, PDGFRA, PMS2, POLD1, POLE, PTEN, RAD50, RAD51C, RAD51D, RNF43, SDHB, SDHC, SDHD, SMAD4, SMARCA4. STK11, TP53, TSC1, TSC2, and VHL.  The following genes were evaluated for sequence changes only: SDHA and HOXB13 c.251G>A variant only.      CANCER THERAPY:  Neoadjuvant chemo ddAC starting 08/18/19 for 4 cycles followed by weekly Taxol x 12 Finished chemotherapy on 01/01/2020.   02/01/2020 s/p right lumpectomy and axillary lymph node dissection. ypT1c ypN1a(sn)  ER/PR positive, HER2 negative, residual invasive mammary carcinoma, grade 3.  1/3 sentinel lymph node positive and 2/10 right axillary lymph nodes positive.  DCIS present. Margin was negative for invasive carcinoma and DCIS Finished adjuvant radiation on 04/30/2020 #  July 2021 started ovarian suppression with Zoladex monthly, started Arimidex 1 mg daily.  INTERVAL HISTORY:  Jessica [name] presents to follow-up for breast cancer. Patient is on ovarian suppression with Zoladex monthly and Arimidex 1 mg daily  Patient has had some hot flash symptoms. She is going to have Hysterectomy with bilateral salpingo-oophorectomy by Dr. Leafy Ro. No new complaints today.   Review of Systems  Constitutional: Positive for fatigue. Negative for appetite change, chills and fever.  HENT:    Negative for hearing loss and voice change.   Eyes: Negative for eye problems.  Respiratory: Negative for chest tightness and cough.   Cardiovascular: Negative for chest pain.  Gastrointestinal: Negative for abdominal distention, abdominal pain and blood in stool.  Endocrine: Positive for hot flashes.  Genitourinary: Negative for difficulty urinating and frequency.   Musculoskeletal: Negative for arthralgias.       Bilateral lower extremity thigh pain, left worse than right.  Skin: Negative for itching and rash.  Neurological: Negative for extremity weakness and numbness.  Hematological: Negative for adenopathy.  Psychiatric/Behavioral: Negative for confusion and sleep disturbance.    MEDICAL HISTORY:  Past Medical History:  Diagnosis Date  . Anemia   . Arthritis    left ankle  . Breast cancer ([place]) 07/23/2019   right breast- [facility]  . Cancer [facility])    breast cancer  . Family history of cervical cancer   . Family history of lung cancer   . Frequency   . GERD (gastroesophageal reflux disease)   . History of kidney stones    h/o  . Obesity   . Personal history of chemotherapy   . Right flank pain     SURGICAL HISTORY: Past Surgical History:  Procedure Laterality Date  . AXILLARY LYMPH NODE DISSECTION Right 02/01/2020   Procedure: AXILLARY LYMPH NODE DISSECTION;  Surgeon: Benjamine Sprague, DO;  Location: [facility] ORS;  Service: General;  Laterality: Right;  . BREAST BIOPSY Right 2020   [facility]  . BREAST LUMPECTOMY Right 02/01/2020   pt has neo adj chemo and post radation with LN removed  . CESAREAN SECTION     x 2  . CHOLECYSTECTOMY    . DILATION AND CURETTAGE OF UTERUS    . LITHOTRIPSY    . PORTACATH PLACEMENT Left 08/17/2019   Procedure: INSERTION PORT-A-CATH;  Surgeon: Benjamine Sprague, DO;  Location: [facility] ORS;  Service: General;  Laterality: Left;  . TUBAL LIGATION      I have reviewed the social history and family history with the patient and they are unchanged from previous  note.  ALLERGIES:  has No Known Allergies.  MEDICATIONS:  Current Outpatient Medications  Medication Sig Dispense Refill  . anastrozole (ARIMIDEX) 1 MG tablet Take 1 tablet (1 mg total) by mouth daily. 90 tablet 1  . Aspirin-Acetaminophen-Caffeine (GOODYS EXTRA STRENGTH) 500-325-65 MG PACK Take 1 packet by mouth daily as needed (pain).    . fluticasone (FLONASE) 50 MCG/ACT nasal spray Place 1 spray into both nostrils daily. (Patient taking differently: Place 1 spray into both nostrils daily as needed for allergies.) 11.1 mL 2  . goserelin (ZOLADEX) 3.6 MG injection Inject 3.6 mg into the skin every 28 (twenty-eight) days.    Marland Kitchen lidocaine-prilocaine (EMLA) cream Apply 1 application topically as needed. Apply small amount to port site 1-2 hours prior to appointment. 30 g 2  . Multiple Vitamins-Minerals (MULTIVITAMIN ADULT) CHEW Chew 2 each by mouth daily.    Marland Kitchen omeprazole (PRILOSEC) 20 MG capsule TAKE 1 CAPSULE BY  MOUTH EVERY DAY (Patient taking differently: Take 20 mg by mouth daily as needed (acid reflux).) 90 capsule 0   No current facility-administered medications for this visit.   Facility-Administered Medications Ordered in Other Visits  Medication Dose Route Frequency Provider Last Rate Last Admin  . sodium chloride flush (NS) 0.9 % injection 10 mL  10 mL Intravenous Once Earlie Server, MD        PHYSICAL EXAMINATION: ECOG PERFORMANCE STATUS: 1 - Symptomatic but completely ambulatory  Vitals:   03/11/21 1448  BP: 117/86  Pulse: 76  Resp: 16  Temp: 98.2 F (36.8 C)   Filed Weights   03/11/21 1448  Weight: 219 lb 1.6 oz (99.4 kg)   Physical Exam Constitutional:      General: She is not in acute distress.    Appearance: She is not diaphoretic.  HENT:     Head: Normocephalic and atraumatic.     Nose: Nose normal.     Mouth/Throat:     Pharynx: No oropharyngeal exudate.  Eyes:     General: No scleral icterus.    Pupils: Pupils are equal, round, and reactive to light.   Cardiovascular:     Rate and Rhythm: Normal rate and regular rhythm.     Heart sounds: No murmur heard.   Pulmonary:     Effort: Pulmonary effort is normal. No respiratory distress.     Breath sounds: No rales.  Chest:     Chest wall: No tenderness.  Abdominal:     General: There is no distension.     Palpations: Abdomen is soft.     Tenderness: There is no abdominal tenderness.  Musculoskeletal:        General: Normal range of motion.     Cervical back: Normal range of motion and neck supple.  Skin:    General: Skin is warm and dry.     Findings: No erythema.  Neurological:     Mental Status: She is alert and oriented to person, place, and time.     Cranial Nerves: No cranial nerve deficit.     Motor: No abnormal muscle tone.     Coordination: Coordination normal.  Psychiatric:        Mood and Affect: Affect normal.      LABORATORY DATA:  I have reviewed the data as listed CBC Latest Ref Rng & Units 03/10/2021 12/11/2020 09/09/2020  WBC 4.0 - 10.5 K/uL 7.8 6.6 6.6  Hemoglobin 12.0 - 15.0 g/dL 12.5 12.6 12.3  Hematocrit 36.0 - 46.0 % 37.9 37.5 36.6  Platelets 150 - 400 K/uL 286 256 271     CMP Latest Ref Rng & Units 03/10/2021 12/11/2020 09/09/2020  Glucose 70 - 99 mg/dL 124(H) 159(H) 97  BUN 6 - 20 mg/dL _0 Creatinine 0.44 - 1.00 mg/dL 0.82 0.73 0.59  Sodium 135 - 145 mmol/L 139 140 138  Potassium 3.5 - 5.1 mmol/L 3.5 3.3(L) 3.5  Chloride 98 - 111 mmol/L 103 104 105  CO2 22 - 32 mmol/L _1 Calcium 8.9 - 10.3 mg/dL 9.0 9.0 8.8(L)  Total Protein 6.5 - 8.1 g/dL 7.2 7.6 7.3  Total Bilirubin 0.3 - 1.2 mg/dL 0.6 0.8 0.6  Alkaline Phos 38 - 126 U/L 70 101 74  AST 15 - 41 U/L _2 ALT 0 - 44 U/L _3 RADIOGRAPHIC STUDIES: I have personally reviewed the radiological images as listed and agreed with the findings  in the report. No results found.   ASSESSMENT & PLAN:  1. Malignant neoplasm of lower-inner quadrant of right breast of female,  estrogen receptor positive ([place])   2. Port-A-Cath in place   3. Suppression of ovarian secretion   4. Aromatase inhibitor use     Invasive right breast cancer,  Cancer Staging Malignant neoplasm of lower-inner quadrant of right breast of female, estrogen receptor positive ([place]) Staging form: Breast, AJCC 8th Edition - Clinical stage from 07/26/2019: Stage IIB (cT2, cN1, cM0, G3, ER+, PR+, HER2-) - Unsigned -Status post 4 cycles of dose dense Adriamycin and Cytoxan, s/p weekly Taxol x 12.  Pathology ypT1c ypN1a(sn) 04/30/2020 finished adjuvant radiation. Patient is premenopausal. Continue Arimidex 1 mg daily. Proceed with Zoladex today. Since patient has plan to proceed with Hysterectomy with bilateral salpingo-oophorectomy,  Will not arrange additional Zoladex for her.  I advised patient to call and update me if her surgery plan is changed. Labs are reviewed and discussed with patient. CA 15-3 and CA 27.29 are both normalized.  Persistent chronic headache, 09/30/2020 MRI brain is negative for intracranial metastasis..  Small mucous retention cyst in the bilateral maxillary sinuses.  Questionable chronic sinusitis which may lead to her chronic headache.  Recommend patient to try intranasal Flonase as needed.  Chronic symptoms.  Stable.  #Continue adjuvant Zometa every 6 months.  She is due for Zometa at the next visit in 3 months.  Recommend patient to continue vitamin D and calcium supplementation.  #Neuropathy due to chemotherapy, pre-existing sciatica, symptom has resolved.  Not on any medication for that. ##Port-A-Cath in place, she is now 1 year after finished surgery treatment.   She would like to take out Mediport in June 2022..- Dr. Lysle Pearl  Follow-up:  follow up lab MD Zometa in 3 months.   All questions were answered. The patient knows to call the clinic with any problems, questions or concerns. No barriers to learning was detected.     Earlie Server, MD 03/11/2021

## 2021-03-12 ENCOUNTER — Encounter
Admission: RE | Admit: 2021-03-12 | Discharge: 2021-03-12 | Disposition: A | Discharge status: Home / Self Care | Payer: BC Managed Care – PPO | Source: Ambulatory Visit | Attending: Obstetrics and Gynecology | Admitting: Obstetrics and Gynecology

## 2021-03-12 ENCOUNTER — Other Ambulatory Visit: Payer: Self-pay

## 2021-03-12 NOTE — Patient Instructions (Addendum)
Your procedure is scheduled on: Mar 21, 2021  FRIDAY Report to the Registration Desk on the 1st floor of the [facility]. To find out your arrival time, please call [phone] between 1PM - 3PM on: Thursday Mar 20, 2021  REMEMBER: Instructions that are not followed completely may result in serious medical risk, up to and including death; or upon the discretion of your surgeon and anesthesiologist your surgery may need to be rescheduled.  Do not eat food after midnight the night before surgery.  No gum chewing, lozengers or hard candies.  You may however, drink CLEAR liquids up to 2 hours before you are scheduled to arrive for your surgery. Do not drink anything within 2 hours of your scheduled arrival time.  Clear liquids include: - water  - apple juice without pulp - gatorade (not RED, PURPLE, OR BLUE) - black coffee or tea (Do NOT add milk or creamers to the coffee or tea) Do NOT drink anything that is not on this list.  Type 1 and Type 2 diabetics should only drink water.  TAKE THESE MEDICATIONS THE MORNING OF SURGERY WITH A SIP OF WATER: OMEPRAZOLE (take one the night before and one on the morning of surgery - helps to prevent nausea after surgery.)  Use  FLONASE MORNING OF SHOWER.  One week prior to surgery: Stop Anti-inflammatories (NSAIDS) such as Advil, Aleve, Ibuprofen, Motrin, Naproxen, Naprosyn and ASPIRIN OR Aspirin based products such as Excedrin, Goodys Powder, BC Powder.  USE TYLENOL IF NEEDED FOR PAIN Stop ANY OVER THE COUNTER supplements until after surgery.  No Alcohol for 24 hours before or after surgery.  No Smoking including e-cigarettes for 24 hours prior to surgery.  No chewable tobacco products for at least 6 hours prior to surgery.  No nicotine patches on the day of surgery.  Do not use any "recreational" drugs for at least a week prior to your surgery.  Please be advised that the combination of cocaine and anesthesia may have negative outcomes, up  to and including death. If you test positive for cocaine, your surgery will be cancelled.  On the morning of surgery brush your teeth with toothpaste and water, you may rinse your mouth with mouthwash if you wish. Do not swallow any toothpaste or mouthwash.  Do not wear jewelry, make-up, hairpins, clips or nail polish.  Do not wear lotions, powders, or perfumes.   Do not shave body from the neck down 48 hours prior to surgery just in case you cut yourself which could leave a site for infection.  Also, freshly shaved skin may become irritated if using the CHG soap.  Contact lenses, hearing aids and dentures may not be worn into surgery.  Do not bring valuables to the hospital. [facility] is not responsible for any missing/lost belongings or valuables.   Use CHG Soap as directed on instruction sheet.  Notify your doctor if there is any change in your medical condition (cold, fever, infection).  Wear comfortable clothing (specific to your surgery type) to the hospital.  Plan for stool softeners for home use; pain medications have a tendency to cause constipation. You can also help prevent constipation by eating foods high in fiber such as fruits and vegetables and drinking plenty of fluids as your diet allows.  After surgery, you can help prevent lung complications by doing breathing exercises.  Take deep breaths and cough every 1-2 hours. Your doctor may order a device called an Incentive Spirometer to help you take  deep breaths. When coughing or sneezing, hold a pillow firmly against your incision with both hands. This is called "splinting." Doing this helps protect your incision. It also decreases belly discomfort.  If you are being discharged the day of surgery, you will not be allowed to drive home. You will need a responsible adult (18 years or older) to drive you home and stay with you that night.   Please call the [facility] Dept. at [phone] if you have any  questions about these instructions.  Surgery Visitation Policy:  Patients undergoing a surgery or procedure may have one family member or support person with them as long as that person is not COVID-19 positive or experiencing its symptoms.  That person may remain in the waiting area during the procedure.  Inpatient Visitation:    Visiting hours are 7 a.m. to 8 p.m. Inpatients will be allowed two visitors daily. The visitors may change each day during the patient's stay. No visitors under the age of 32. Any visitor under the age of 20 must be accompanied by an adult. The visitor must pass COVID-19 screenings, use hand sanitizer when entering and exiting the patient's room and wear a mask at all times, including in the patient's room. Patients must also wear a mask when staff or their visitor are in the room. Masking is required regardless of vaccination status.

## 2021-03-13 ENCOUNTER — Other Ambulatory Visit: Payer: Self-pay | Admitting: Oncology

## 2021-03-14 ENCOUNTER — Other Ambulatory Visit: Admission: RE | Admit: 2021-03-14 | Payer: BC Managed Care – PPO | Source: Ambulatory Visit

## 2021-03-17 ENCOUNTER — Other Ambulatory Visit: Payer: Self-pay

## 2021-03-17 ENCOUNTER — Encounter
Admission: RE | Admit: 2021-03-17 | Discharge: 2021-03-17 | Disposition: A | Discharge status: Home / Self Care | Payer: BC Managed Care – PPO | Source: Ambulatory Visit | Attending: Obstetrics and Gynecology | Admitting: Obstetrics and Gynecology

## 2021-03-17 DIAGNOSIS — Z01812 Encounter for preprocedural laboratory examination: Secondary | ICD-10-CM | POA: Diagnosis not present

## 2021-03-17 LAB — BASIC METABOLIC PANEL
Anion gap: 10 (ref 5–15)
BUN: 9 mg/dL (ref 6–20)
CO2: 26 mmol/L (ref 22–32)
Calcium: 9 mg/dL (ref 8.9–10.3)
Chloride: 103 mmol/L (ref 98–111)
Creatinine, Ser: 0.8 mg/dL (ref 0.44–1.00)
GFR, Estimated: >60 mL/min (ref >60)
Glucose, Bld: 88 mg/dL (ref 70–99)
Potassium: 3.8 mmol/L (ref 3.5–5.1)
Sodium: 139 mmol/L (ref 135–145)

## 2021-03-17 LAB — CBC
HCT: 39 % (ref 36.0–46.0)
Hemoglobin: 12.8 g/dL (ref 12.0–15.0)
MCH: 28.3 pg (ref 26.0–34.0)
MCHC: 32.8 g/dL (ref 30.0–36.0)
MCV: 86.1 fL (ref 80.0–100.0)
Platelets: 229 10*3/uL (ref 150–400)
RBC: 4.53 MIL/uL (ref 3.87–5.11)
RDW: 12.8 % (ref 11.5–15.5)
WBC: 5.5 10*3/uL (ref 4.0–10.5)
nRBC: 0 % (ref 0.0–0.2)

## 2021-03-18 LAB — TYPE AND SCREEN
ABO/RH(D): A NEG
Antibody Screen: NEGATIVE

## 2021-03-19 ENCOUNTER — Other Ambulatory Visit: Admission: RE | Admit: 2021-03-19 | Payer: BC Managed Care – PPO | Source: Ambulatory Visit

## 2021-03-21 ENCOUNTER — Ambulatory Visit: Payer: BC Managed Care – PPO | Admitting: Registered Nurse

## 2021-03-21 ENCOUNTER — Encounter
Admission: RE | Disposition: A | Discharge status: Home / Self Care | Payer: Self-pay | Source: Home / Self Care | Attending: Obstetrics and Gynecology

## 2021-03-21 ENCOUNTER — Encounter: Payer: Self-pay | Admitting: Obstetrics and Gynecology

## 2021-03-21 ENCOUNTER — Ambulatory Visit
Admission: RE | Admit: 2021-03-21 | Discharge: 2021-03-21 | Disposition: A | Discharge status: Home / Self Care | Payer: BC Managed Care – PPO | Attending: Obstetrics and Gynecology | Admitting: Obstetrics and Gynecology

## 2021-03-21 ENCOUNTER — Other Ambulatory Visit: Payer: Self-pay

## 2021-03-21 DIAGNOSIS — Z79811 Long term (current) use of aromatase inhibitors: Secondary | ICD-10-CM | POA: Diagnosis not present

## 2021-03-21 DIAGNOSIS — Z87891 Personal history of nicotine dependence: Secondary | ICD-10-CM | POA: Insufficient documentation

## 2021-03-21 DIAGNOSIS — C773 Secondary and unspecified malignant neoplasm of axilla and upper limb lymph nodes: Secondary | ICD-10-CM | POA: Insufficient documentation

## 2021-03-21 DIAGNOSIS — Z8041 Family history of malignant neoplasm of ovary: Secondary | ICD-10-CM | POA: Insufficient documentation

## 2021-03-21 DIAGNOSIS — D259 Leiomyoma of uterus, unspecified: Secondary | ICD-10-CM | POA: Insufficient documentation

## 2021-03-21 DIAGNOSIS — Z9221 Personal history of antineoplastic chemotherapy: Secondary | ICD-10-CM | POA: Insufficient documentation

## 2021-03-21 DIAGNOSIS — Z170 Estrogen receptor positive status [ER+]: Secondary | ICD-10-CM | POA: Diagnosis not present

## 2021-03-21 DIAGNOSIS — Z8349 Family history of other endocrine, nutritional and metabolic diseases: Secondary | ICD-10-CM | POA: Insufficient documentation

## 2021-03-21 DIAGNOSIS — C50911 Malignant neoplasm of unspecified site of right female breast: Secondary | ICD-10-CM | POA: Diagnosis not present

## 2021-03-21 DIAGNOSIS — Z841 Family history of disorders of kidney and ureter: Secondary | ICD-10-CM | POA: Diagnosis not present

## 2021-03-21 DIAGNOSIS — Z79899 Other long term (current) drug therapy: Secondary | ICD-10-CM | POA: Diagnosis not present

## 2021-03-21 DIAGNOSIS — Z87442 Personal history of urinary calculi: Secondary | ICD-10-CM | POA: Diagnosis not present

## 2021-03-21 DIAGNOSIS — N800 Endometriosis of uterus: Secondary | ICD-10-CM | POA: Diagnosis not present

## 2021-03-21 DIAGNOSIS — N838 Other noninflammatory disorders of ovary, fallopian tube and broad ligament: Secondary | ICD-10-CM | POA: Insufficient documentation

## 2021-03-21 DIAGNOSIS — K219 Gastro-esophageal reflux disease without esophagitis: Secondary | ICD-10-CM | POA: Insufficient documentation

## 2021-03-21 HISTORY — PX: ROBOTIC ASSISTED TOTAL HYSTERECTOMY WITH BILATERAL SALPINGO OOPHERECTOMY: SHX6086

## 2021-03-21 LAB — ABO/RH: ABO/RH(D): A NEG

## 2021-03-21 LAB — POCT PREGNANCY, URINE: Preg Test, Ur: NEGATIVE

## 2021-03-21 SURGERY — HYSTERECTOMY, TOTAL, ROBOT-ASSISTED, LAPAROSCOPIC, WITH BILATERAL SALPINGO-OOPHORECTOMY
Anesthesia: General | Laterality: Bilateral

## 2021-03-21 MED ORDER — ORAL CARE MOUTH RINSE: 15.0000 mL | Freq: Once | OROMUCOSAL | Status: AC

## 2021-03-21 MED ORDER — DEXMEDETOMIDINE (PRECEDEX) IN NS 20 MCG/5ML (4 MCG/ML) IV SYRINGE
PREFILLED_SYRINGE | INTRAVENOUS | Status: AC
  Filled 2021-03-21: qty 5

## 2021-03-21 MED ORDER — PROPOFOL 10 MG/ML IV BOLUS
INTRAVENOUS | Status: DC | PRN
  Administered 2021-03-21: 200 mg via INTRAVENOUS

## 2021-03-21 MED ORDER — PHENYLEPHRINE HCL (PRESSORS) 10 MG/ML IV SOLN
INTRAVENOUS | Status: AC
  Filled 2021-03-21: qty 1

## 2021-03-21 MED ORDER — ROCURONIUM BROMIDE 10 MG/ML (PF) SYRINGE
PREFILLED_SYRINGE | INTRAVENOUS | Status: AC
  Filled 2021-03-21: qty 10

## 2021-03-21 MED ORDER — ACETAMINOPHEN 500 MG PO TABS
1000.0000 mg | ORAL_TABLET | ORAL | Status: AC
Start: 2021-03-21 — End: 2021-03-21

## 2021-03-21 MED ORDER — IBUPROFEN 800 MG PO TABS: 800.0000 mg | ORAL_TABLET | Freq: Three times a day (TID) | ORAL | 1 refills | Status: AC

## 2021-03-21 MED ORDER — FENTANYL CITRATE (PF) 100 MCG/2ML IJ SOLN
INTRAMUSCULAR | Status: AC
  Filled 2021-03-21: qty 2

## 2021-03-21 MED ORDER — KETOROLAC TROMETHAMINE 30 MG/ML IJ SOLN
INTRAMUSCULAR | Status: DC | PRN
  Administered 2021-03-21: 30 mg via INTRAVENOUS

## 2021-03-21 MED ORDER — DEXMEDETOMIDINE HCL 200 MCG/2ML IV SOLN
INTRAVENOUS | Status: DC | PRN
  Administered 2021-03-21: 8 ug via INTRAVENOUS

## 2021-03-21 MED ORDER — ACETAMINOPHEN 500 MG PO TABS
ORAL_TABLET | ORAL | Status: AC
  Administered 2021-03-21: 1000 mg via ORAL
  Filled 2021-03-21: qty 2

## 2021-03-21 MED ORDER — OXYCODONE HCL 5 MG PO TABS: 5.0000 mg | ORAL_TABLET | Freq: Once | ORAL | Status: AC | PRN

## 2021-03-21 MED ORDER — POVIDONE-IODINE 10 % EX SWAB
2.0000 "application " | Freq: Once | CUTANEOUS | Status: AC
  Administered 2021-03-21: 2 via TOPICAL

## 2021-03-21 MED ORDER — FLUORESCEIN SODIUM 10 % IV SOLN
INTRAVENOUS | Status: AC
  Filled 2021-03-21: qty 5

## 2021-03-21 MED ORDER — KETAMINE HCL 10 MG/ML IJ SOLN
INTRAMUSCULAR | Status: DC | PRN
  Administered 2021-03-21 (×2): 25 mg via INTRAVENOUS

## 2021-03-21 MED ORDER — ONDANSETRON HCL 4 MG/2ML IJ SOLN
INTRAMUSCULAR | Status: AC
  Filled 2021-03-21: qty 12

## 2021-03-21 MED ORDER — PROPOFOL 10 MG/ML IV BOLUS
INTRAVENOUS | Status: AC
  Filled 2021-03-21: qty 20

## 2021-03-21 MED ORDER — ACETAMINOPHEN 500 MG PO TABS: 1000.0000 mg | ORAL_TABLET | Freq: Four times a day (QID) | ORAL | 0 refills | Status: AC

## 2021-03-21 MED ORDER — GABAPENTIN 300 MG PO CAPS: 300.0000 mg | ORAL_CAPSULE | ORAL | Status: AC

## 2021-03-21 MED ORDER — PROMETHAZINE HCL 25 MG/ML IJ SOLN
6.2500 mg | INTRAMUSCULAR | Status: DC | PRN
  Administered 2021-03-21: 6.25 mg via INTRAVENOUS

## 2021-03-21 MED ORDER — FENTANYL CITRATE (PF) 100 MCG/2ML IJ SOLN
25.0000 ug | INTRAMUSCULAR | Status: AC | PRN
  Administered 2021-03-21 (×5): 25 ug via INTRAVENOUS

## 2021-03-21 MED ORDER — SEVOFLURANE IN SOLN
RESPIRATORY_TRACT | Status: AC
  Filled 2021-03-21: qty 250

## 2021-03-21 MED ORDER — LIDOCAINE HCL URETHRAL/MUCOSAL 2 % EX GEL
CUTANEOUS | Status: AC
  Filled 2021-03-21: qty 5

## 2021-03-21 MED ORDER — MIDAZOLAM HCL 2 MG/2ML IJ SOLN
INTRAMUSCULAR | Status: DC | PRN
  Administered 2021-03-21: 2 mg via INTRAVENOUS

## 2021-03-21 MED ORDER — CHLORHEXIDINE GLUCONATE 0.12 % MT SOLN
OROMUCOSAL | Status: AC
  Administered 2021-03-21: 15 mL via OROMUCOSAL
  Filled 2021-03-21: qty 15

## 2021-03-21 MED ORDER — SODIUM CHLORIDE FLUSH 0.9 % IV SOLN
INTRAVENOUS | Status: AC
  Filled 2021-03-21: qty 10

## 2021-03-21 MED ORDER — PROMETHAZINE HCL 25 MG/ML IJ SOLN
INTRAMUSCULAR | Status: AC
  Filled 2021-03-21: qty 1

## 2021-03-21 MED ORDER — DEXAMETHASONE SODIUM PHOSPHATE 10 MG/ML IJ SOLN
INTRAMUSCULAR | Status: DC | PRN
  Administered 2021-03-21: 10 mg via INTRAVENOUS

## 2021-03-21 MED ORDER — FENTANYL CITRATE (PF) 100 MCG/2ML IJ SOLN
INTRAMUSCULAR | Status: AC
  Administered 2021-03-21: 25 ug via INTRAVENOUS
  Filled 2021-03-21: qty 2

## 2021-03-21 MED ORDER — OXYCODONE HCL 5 MG/5ML PO SOLN: 5.0000 mg | Freq: Once | ORAL | Status: AC | PRN

## 2021-03-21 MED ORDER — CEFAZOLIN SODIUM-DEXTROSE 2-4 GM/100ML-% IV SOLN
2.0000 g | INTRAVENOUS | Status: AC
  Administered 2021-03-21: 2 g via INTRAVENOUS

## 2021-03-21 MED ORDER — LACTATED RINGERS IV SOLN: INTRAVENOUS | Status: DC

## 2021-03-21 MED ORDER — OXYCODONE HCL 5 MG PO TABS: 5.0000 mg | ORAL_TABLET | ORAL | 0 refills | Status: DC | PRN

## 2021-03-21 MED ORDER — ROCURONIUM BROMIDE 100 MG/10ML IV SOLN
INTRAVENOUS | Status: DC | PRN
  Administered 2021-03-21: 50 mg via INTRAVENOUS
  Administered 2021-03-21: 10 mg via INTRAVENOUS
  Administered 2021-03-21: 50 mg via INTRAVENOUS

## 2021-03-21 MED ORDER — EPHEDRINE 5 MG/ML INJ
INTRAVENOUS | Status: AC
  Filled 2021-03-21: qty 20

## 2021-03-21 MED ORDER — KETOROLAC TROMETHAMINE 30 MG/ML IJ SOLN
INTRAMUSCULAR | Status: AC
  Filled 2021-03-21: qty 1

## 2021-03-21 MED ORDER — FENTANYL CITRATE (PF) 250 MCG/5ML IJ SOLN
INTRAMUSCULAR | Status: AC
  Filled 2021-03-21: qty 5

## 2021-03-21 MED ORDER — FENTANYL CITRATE (PF) 100 MCG/2ML IJ SOLN
INTRAMUSCULAR | Status: DC | PRN
  Administered 2021-03-21: 25 ug via INTRAVENOUS
  Administered 2021-03-21: 100 ug via INTRAVENOUS

## 2021-03-21 MED ORDER — KETAMINE HCL 50 MG/ML IJ SOLN
INTRAMUSCULAR | Status: AC
  Filled 2021-03-21: qty 1

## 2021-03-21 MED ORDER — EPHEDRINE 5 MG/ML INJ
INTRAVENOUS | Status: AC
  Filled 2021-03-21: qty 10

## 2021-03-21 MED ORDER — CHLORHEXIDINE GLUCONATE 0.12 % MT SOLN: 15.0000 mL | Freq: Once | OROMUCOSAL | Status: AC

## 2021-03-21 MED ORDER — DOCUSATE SODIUM 100 MG PO CAPS: 100.0000 mg | ORAL_CAPSULE | Freq: Two times a day (BID) | ORAL | 0 refills | Status: DC

## 2021-03-21 MED ORDER — SUCCINYLCHOLINE CHLORIDE 200 MG/10ML IV SOSY
PREFILLED_SYRINGE | INTRAVENOUS | Status: AC
  Filled 2021-03-21: qty 10

## 2021-03-21 MED ORDER — LIDOCAINE HCL (CARDIAC) PF 100 MG/5ML IV SOSY
PREFILLED_SYRINGE | INTRAVENOUS | Status: DC | PRN
  Administered 2021-03-21: 100 mg via INTRAVENOUS

## 2021-03-21 MED ORDER — ONDANSETRON HCL 4 MG/2ML IJ SOLN
INTRAMUSCULAR | Status: AC
  Filled 2021-03-21: qty 2

## 2021-03-21 MED ORDER — OXYCODONE HCL 5 MG PO TABS
ORAL_TABLET | ORAL | Status: AC
  Administered 2021-03-21: 5 mg via ORAL
  Filled 2021-03-21: qty 1

## 2021-03-21 MED ORDER — BUPIVACAINE HCL (PF) 0.5 % IJ SOLN
INTRAMUSCULAR | Status: DC | PRN
  Administered 2021-03-21: 18 mL

## 2021-03-21 MED ORDER — DEXAMETHASONE SODIUM PHOSPHATE 10 MG/ML IJ SOLN
INTRAMUSCULAR | Status: AC
  Filled 2021-03-21: qty 1

## 2021-03-21 MED ORDER — GABAPENTIN 800 MG PO TABS: 800.0000 mg | ORAL_TABLET | Freq: Every day | ORAL | 0 refills | Status: DC

## 2021-03-21 MED ORDER — LACTATED RINGERS IV SOLN: INTRAVENOUS | Status: DC | PRN

## 2021-03-21 MED ORDER — MEPERIDINE HCL 25 MG/ML IJ SOLN: 6.2500 mg | INTRAMUSCULAR | Status: DC | PRN

## 2021-03-21 MED ORDER — GABAPENTIN 300 MG PO CAPS
ORAL_CAPSULE | ORAL | Status: AC
  Administered 2021-03-21: 300 mg via ORAL
  Filled 2021-03-21: qty 1

## 2021-03-21 MED ORDER — BUPIVACAINE HCL (PF) 0.5 % IJ SOLN
INTRAMUSCULAR | Status: AC
  Filled 2021-03-21: qty 30

## 2021-03-21 MED ORDER — EPHEDRINE SULFATE 50 MG/ML IJ SOLN
INTRAMUSCULAR | Status: DC | PRN
  Administered 2021-03-21: 5 mg via INTRAVENOUS

## 2021-03-21 MED ORDER — PHENYLEPHRINE HCL (PRESSORS) 10 MG/ML IV SOLN
INTRAVENOUS | Status: DC | PRN
  Administered 2021-03-21 (×4): 100 ug via INTRAVENOUS

## 2021-03-21 MED ORDER — SUGAMMADEX SODIUM 200 MG/2ML IV SOLN
INTRAVENOUS | Status: DC | PRN
  Administered 2021-03-21: 200 mg via INTRAVENOUS

## 2021-03-21 MED ORDER — MIDAZOLAM HCL 2 MG/2ML IJ SOLN
INTRAMUSCULAR | Status: AC
  Filled 2021-03-21: qty 2

## 2021-03-21 MED ORDER — CEFAZOLIN SODIUM-DEXTROSE 2-4 GM/100ML-% IV SOLN
INTRAVENOUS | Status: AC
  Filled 2021-03-21: qty 100

## 2021-03-21 MED ORDER — OXYCODONE HCL 5 MG PO TABS: 5.0000 mg | ORAL_TABLET | Freq: Once | ORAL | Status: AC

## 2021-03-21 SURGICAL SUPPLY — 68 items
ADH SKN CLS APL DERMABOND .7 (GAUZE/BANDAGES/DRESSINGS) ×1
APL PRP STRL LF DISP 70% ISPRP (MISCELLANEOUS) ×1
BAG DRN RND TRDRP ANRFLXCHMBR (UROLOGICAL SUPPLIES) ×1
BAG URINE DRAIN 2000ML AR STRL (UROLOGICAL SUPPLIES) ×2 IMPLANT
BLADE SURG SZ11 CARB STEEL (BLADE) ×2 IMPLANT
CANNULA CAP OBTURATR AIRSEAL 8 (CAP) ×2 IMPLANT
CATH FOLEY 2WAY  5CC 16FR (CATHETERS) ×2
CATH FOLEY 2WAY 5CC 16FR (CATHETERS) ×1
CATH URTH 16FR FL 2W BLN LF (CATHETERS) ×1 IMPLANT
CHLORAPREP W/TINT 26 (MISCELLANEOUS) ×2 IMPLANT
COVER TIP SHEARS 8 DVNC (MISCELLANEOUS) ×1 IMPLANT
COVER TIP SHEARS 8MM DA VINCI (MISCELLANEOUS) ×2
COVER WAND RF STERILE (DRAPES) ×2 IMPLANT
DEFOGGER SCOPE WARMER CLEARIFY (MISCELLANEOUS) ×2 IMPLANT
DERMABOND ADVANCED (GAUZE/BANDAGES/DRESSINGS) ×1
DERMABOND ADVANCED .7 DNX12 (GAUZE/BANDAGES/DRESSINGS) ×1 IMPLANT
DRAPE 3/4 80X56 (DRAPES) ×4 IMPLANT
DRAPE ARM DVNC X/XI (DISPOSABLE) ×4 IMPLANT
DRAPE COLUMN DVNC XI (DISPOSABLE) ×1 IMPLANT
DRAPE DA VINCI XI ARM (DISPOSABLE) ×8
DRAPE DA VINCI XI COLUMN (DISPOSABLE) ×2
DRSG TEGADERM 2-3/8X2-3/4 SM (GAUZE/BANDAGES/DRESSINGS) ×10 IMPLANT
ELECT REM PT RETURN 9FT ADLT (ELECTROSURGICAL)
ELECTRODE REM PT RTRN 9FT ADLT (ELECTROSURGICAL) IMPLANT
GAUZE SPONGE 4X4 16PLY XRAY LF (GAUZE/BANDAGES/DRESSINGS) ×2 IMPLANT
GLOVE SURG ENC MOIS LTX SZ7 (GLOVE) ×8 IMPLANT
GLOVE SURG UNDER LTX SZ7.5 (GLOVE) ×8 IMPLANT
GOWN STRL REUS W/ TWL LRG LVL3 (GOWN DISPOSABLE) ×8 IMPLANT
GOWN STRL REUS W/TWL LRG LVL3 (GOWN DISPOSABLE) ×16
GRASPER SUT TROCAR 14GX15 (MISCELLANEOUS) ×2 IMPLANT
IRRIGATION STRYKERFLOW (MISCELLANEOUS) IMPLANT
IRRIGATOR STRYKERFLOW (MISCELLANEOUS)
IV NS 1000ML (IV SOLUTION)
IV NS 1000ML BAXH (IV SOLUTION) IMPLANT
KIT PINK PAD W/HEAD ARE REST (MISCELLANEOUS) ×2
KIT PINK PAD W/HEAD ARM REST (MISCELLANEOUS) ×1 IMPLANT
KIT TURNOVER CYSTO (KITS) ×2 IMPLANT
LABEL OR SOLS (LABEL) ×2 IMPLANT
MANIFOLD NEPTUNE II (INSTRUMENTS) ×2 IMPLANT
MANIPULATOR VCARE LG CRV RETR (MISCELLANEOUS) IMPLANT
MANIPULATOR VCARE SML CRV RETR (MISCELLANEOUS) IMPLANT
MANIPULATOR VCARE STD CRV RETR (MISCELLANEOUS) ×2 IMPLANT
NS IRRIG 1000ML POUR BTL (IV SOLUTION) ×2 IMPLANT
OBTURATOR OPTICAL STANDARD 8MM (TROCAR) ×2
OBTURATOR OPTICAL STND 8 DVNC (TROCAR) ×1
OBTURATOR OPTICALSTD 8 DVNC (TROCAR) ×1 IMPLANT
OCCLUDER COLPOPNEUMO (BALLOONS) ×2 IMPLANT
PACK GYN LAPAROSCOPIC (MISCELLANEOUS) ×2 IMPLANT
PAD OB MATERNITY 4.3X12.25 (PERSONAL CARE ITEMS) ×2 IMPLANT
PAD PREP 24X41 OB/GYN DISP (PERSONAL CARE ITEMS) ×2 IMPLANT
PENCIL ELECTRO HAND CTR (MISCELLANEOUS) ×2 IMPLANT
PORT ACCESS TROCAR AIRSEAL 5 (TROCAR) ×2 IMPLANT
SCISSORS METZENBAUM CVD 33 (INSTRUMENTS) IMPLANT
SEAL CANN UNIV 5-8 DVNC XI (MISCELLANEOUS) ×4 IMPLANT
SEAL XI 5MM-8MM UNIVERSAL (MISCELLANEOUS) ×8
SEALER VESSEL DA VINCI XI (MISCELLANEOUS) ×2
SEALER VESSEL EXT DVNC XI (MISCELLANEOUS) ×1 IMPLANT
SET CYSTO W/LG BORE CLAMP LF (SET/KITS/TRAYS/PACK) ×2 IMPLANT
SET TRI-LUMEN FLTR TB AIRSEAL (TUBING) ×2 IMPLANT
SET TUBE FILTERED XL AIRSEAL (SET/KITS/TRAYS/PACK) ×2 IMPLANT
SOLUTION ELECTROLUBE (MISCELLANEOUS) ×2 IMPLANT
SPONGE GAUZE 2X2 8PLY STRL LF (GAUZE/BANDAGES/DRESSINGS) ×4 IMPLANT
SURGILUBE 2OZ TUBE FLIPTOP (MISCELLANEOUS) ×2 IMPLANT
SUT MNCRL 4-0 (SUTURE) ×4
SUT MNCRL 4-0 27XMFL (SUTURE) ×2
SUT VIC AB 0 CT2 27 (SUTURE) ×2 IMPLANT
SUT VLOC 90 2/L VL 12 GS22 (SUTURE) ×4 IMPLANT
SUTURE MNCRL 4-0 27XMF (SUTURE) ×2 IMPLANT

## 2021-03-21 NOTE — Anesthesia Postprocedure Evaluation (Signed)
Anesthesia Post Note  Patient: Jessica [name]  Procedure(s) Performed: XI ROBOTIC ASSISTED TOTAL HYSTERECTOMY WITH BILATERAL SALPINGO OOPHORECTOMY (Bilateral )  Patient location during evaluation: PACU Anesthesia Type: General Level of consciousness: awake and alert and oriented Pain management: pain level controlled Vital Signs Assessment: post-procedure vital signs reviewed and stable Respiratory status: spontaneous breathing, nonlabored ventilation and respiratory function stable Cardiovascular status: blood pressure returned to baseline and stable Postop Assessment: no signs of nausea or vomiting Anesthetic complications: no   No complications documented.   Last Vitals:  Vitals:   03/21/21 1230 03/21/21 1234  BP: 114/73   Pulse: 89 87  Resp: 19 15  Temp:    SpO2: 98% 94%    Last Pain:  Vitals:   03/21/21 1234  TempSrc:   PainSc: 4                  Mavis Fichera

## 2021-03-21 NOTE — Op Note (Signed)
Jessica [name] PROCEDURE DATE: 03/21/2021  PREOPERATIVE DIAGNOSIS: Estrogen receptor positive premenopausal breast cancer  POSTOPERATIVE DIAGNOSIS: The same  PROCEDURE:  XI ROBOTIC ASSISTED TOTAL HYSTERECTOMY WITH BILATERAL SALPINGO OOPHORECTOMY:   SURGEON:  Dr. Benjaman Kindler, MD  Anesthesiologist:  Anesthesiologist: Emmie Niemann, MD CRNA: Lerry Liner, CRNA; Hedda Slade, CRNA  INDICATIONS: 45 y.o. [phone] here for definitive surgical management secondary to the indications listed under preoperative diagnoses; please see preoperative note for further details. Desires hysterectomy due to dx of Invasive ER+, PR+ right breast cancer. Patient had planned to consult with her oncologist regarding BSO vs BS, which she has discussed with them. Patient requests a total hysterectomy with bilateral salpingo oophorectomy.   Risks of surgery were discussed with the patient including but not limited to: bleeding which may require transfusion or reoperation; infection which may require antibiotics; injury to bowel, bladder, ureters or other surrounding organs; need for additional procedures; thromboembolic phenomenon, incisional problems and other postoperative/anesthesia complications. Written informed consent was obtained.    FINDINGS:   Pelvic: External genitalia negative for lesions. Vagina negative. Adnexa negative for masses or nodularity. Cervix without gross lesions. Uterus mobile, anteverted, small.  Intraoperative findings revealed a normal upper abdomen including bowel, diaphragmatic surfaces, stomach, and omentum.  The uterus was small and mobile.  The right and left ovaries appeared normal.  Bilateral tubes appeared normal, after having had a BTL in the past   ANESTHESIA:    General INTRAVENOUS FLUIDS:1000  ml ESTIMATED BLOOD LOSS:20 ml URINE OUTPUT: 300 ml  SPECIMENS: Uterus, cervix, bilateral fallopian tubes and bilateral ovaries COMPLICATIONS: None  immediate  PROCEDURE IN DETAIL: After informed consent was obtained, the patient was taken to the operating room where general anesthesia was obtained without difficulty. The patient was positioned in the dorsal lithotomy position in [place] and her arms were carefully tucked at her sides and the usual precautions were taken. Deep Trendelenburg (20-25 deg) was established to confirm that she does not shift on the table.  She was prepped and draped in normal sterile fashion.  Time-out was performed and a Foley catheter was placed into the bladder. A standard VCare uterine manipulator was then placed in the uterus without incident.  Preoperative prophylactic antibiotics were given through her iv.  After infiltration of local anesthetic at the proposed trocar sites, an 8 mm incision was created above the umbilicus at the site of a prior scar, and a robot 61mm was placed under direct visualization. Pneumoperitoneum was created to a pressure of 15 mm Hg. The camera was placed and the abdomin surveyed, noting intact bowel below the site of entry. A survey of the pelvis and upper abdomen revealed the above findings. 2 Right and 1 left lateral 8-mm robotic ports were placed under direct visualization.  The patient was placed in deepTrendelenburg and the bowel was displaced up into the upper abdomen. The robot was left side docked. The instruments were placed under direct visualization.   The ureters were identified bilaterally coursing outside of the operative field. Round ligaments were divided on each side with the EndoShears and the retroperitoneal space was opened bilaterally. The posterior leaflet of the broad was taken down to the level of the IP ligament. The anterior leaflet of the broad ligament was carefully taken down to the midline.  A bladder flap was created and the bladder was dissected down off the lower uterine segment and cervix using endoshears and electrocautery.    Ovariolysis was  performed and the ovary was  dissected medially with care to avoid the ureter.  The infundibulopelvic ligaments were skeletonized, sealed and divided. The peritoneum was taken down to the level of the internal os, and the uterine arteries skeletonized. With strong cephalad pressure from the V-care, bipolar cautery was used to seal and transect the uterine arteries, and the pedicles allowed to fall away laterally.  A colpotomy was performed circumferentially along the V-Care ring with monopolar electrocautery and the cervix was incised from the vagina using the laparoscopic scissors. The specimen was removed through the vagina.  A pneumo balloon was placed in the vagina and the vaginal cuff was then closed in a running continuous fashion using the  0 V-Lock suture with careful attention to include the vaginal cuff angles and the vaginal mucosa within the closure.  Hemostasis was secured when the intraperitoneal pressure was dropped, and all planes of dissection, vascular pedicles and the vaginal cuff were found to be hemostatic.  The robot was undocked. The lateral trocars were removed under visualization.  The CO2 gas was released and several deep breaths given to remove any remaining CO2 from the peritoneal cavity.  A cystoscopy revealed bladder intact with vigorous efflux from bilateral ureteral openings. The skin incisions were closed with 4-0 Monocryl subcuticular stitch and pressure dressings placed.    Anesthesia was reversed without difficulty.  The patient tolerated the procedure well.  Sponge, lap and needle counts were correct x2.  The patient was taken to recovery room in excellent condition.

## 2021-03-21 NOTE — Anesthesia Procedure Notes (Signed)
Procedure Name: Intubation Date/Time: 03/21/2021 7:44 AM Performed by: Hedda Slade, CRNA Pre-anesthesia Checklist: Patient identified, Patient being monitored, Timeout performed, Emergency Drugs available and Suction available Patient Re-evaluated:Patient Re-evaluated prior to induction Oxygen Delivery Method: Circle system utilized Preoxygenation: Pre-oxygenation with 100% oxygen Induction Type: IV induction Ventilation: Mask ventilation without difficulty Laryngoscope Size: 3 and McGraph Grade View: Grade I Tube type: Oral Tube size: 7.0 mm Number of attempts: 1 Airway Equipment and Method: Stylet and Video-laryngoscopy Placement Confirmation: ETT inserted through vocal cords under direct vision,  positive ETCO2 and breath sounds checked- equal and bilateral Secured at: 21 cm Tube secured with: Tape Dental Injury: Teeth and Oropharynx as per pre-operative assessment

## 2021-03-21 NOTE — Transfer of Care (Signed)
Immediate Anesthesia Transfer of Care Note  Patient: Jessica [name]  Procedure(s) Performed: XI ROBOTIC ASSISTED TOTAL HYSTERECTOMY WITH BILATERAL SALPINGO OOPHORECTOMY (Bilateral )  Patient Location: PACU  Anesthesia Type:General  Level of Consciousness: sedated  Airway & Oxygen Therapy: Patient Spontanous Breathing and Patient connected to face mask oxygen  Post-op Assessment: Report given to RN and Post -op Vital signs reviewed and stable  Post vital signs: Reviewed and stable  Last Vitals:  Vitals Value Taken Time  BP 108/64 03/21/21 1103  Temp 36.7 C 03/21/21 1103  Pulse 83 03/21/21 1104  Resp 14 03/21/21 1104  SpO2 100 % 03/21/21 1104  Vitals shown include unvalidated device data.  Last Pain:  Vitals:   03/21/21 1103  TempSrc:   PainSc: 0-No pain         Complications: No complications documented.

## 2021-03-21 NOTE — Discharge Instructions (Addendum)
Discharge instructions after  robotically-assisted total laparoscopic hysterectomy   For the next three days, take ibuprofen and acetaminophen on a schedule, every 8 hours. You can take them together or you can intersperse them, and take one every four hours. I also gave you gabapentin for nighttime, to help you sleep and also to control pain. Take gabapentin medicines at night for at least the next 3 nights. You also have a narcotic, oxycodone, to take as needed if the above medicines don't help.  Postop constipation is a major cause of pain. Stay well hydrated, walk as you tolerate, and take over the counter senna as well as stool softeners if you need them.   Signs and Symptoms to Report Call our office at [phone] if you have any of the following.  . Fever over 100.4 degrees or higher . Severe stomach pain not relieved with pain medications . Bright red bleeding that's heavier than a period that does not slow with rest . To go the bathroom a lot (frequency), you can't hold your urine (urgency), or it hurts when you empty your bladder (urinate) . Chest pain . Shortness of breath . Pain in the calves of your legs . Severe nausea and vomiting not relieved with anti-nausea medications . Signs of infection around your wounds, such as redness, hot to touch, swelling, green/yellow drainage (like pus), bad smelling discharge . Any concerns  What You Can Expect after Surgery . You may see some pink tinged, bloody fluid and bruising around the wound. This is normal. . You may notice shoulder and neck pain. This is caused by the gas used during surgery to expand your abdomen so your surgeon could get to the uterus easier. . You may have a sore throat because of the tube in your mouth during general anesthesia. This will go away in 2 to 3 days. . You may have some stomach cramps. . You may notice spotting on your panties. . You may have pain around the incision sites.   Activities after  Your Discharge Follow these guidelines to help speed your recovery at home: . Do the coughing and deep breathing as you did in the hospital for 2 weeks. Use the small blue breathing device, called the incentive spirometer for 2 weeks. . Don't drive if you are in pain or taking narcotic pain medicine. You may drive when you can safely slam on the brakes, turn the wheel forcefully, and rotate your torso comfortably. This is typically 1-2 weeks. Practice in a parking lot or side street prior to attempting to drive regularly.  . Ask others to help with household chores for 4 weeks. . Do not lift anything heavier that 10 pounds for 4-6 weeks. This includes pets, children, and groceries. . Don't do strenuous activities, exercises, or sports like vacuuming, tennis, squash, etc. until your doctor says it is safe to do so. ---Maintain pelvic rest for 12 weeks. This means nothing in the vagina or rectum at all (no douching, tampons, intercourse) for 12 weeks.  . Walk as you feel able. Rest often since it may take two or three weeks for your energy level to return to normal.  . You may climb stairs . Avoid constipation:   -Eat fruits, vegetables, and whole grains. Eat small meals as your appetite will take time to return to normal.   -Drink 6 to 8 glasses of water each day unless your doctor has told you to limit your fluids.   -Use a laxative or   stool softener as needed if constipation becomes a problem. You may take Miralax, metamucil, Citrucil, Colace, Senekot, FiberCon, etc. If this does not relieve the constipation, try two tablespoons of Milk Of Magnesia every 8 hours until your bowels move.  . You may shower. Gently wash the wounds with a mild soap and water. Pat dry. . Do not get in a hot tub, swimming pool, etc. for 6 weeks. . Do not use lotions, oils, powders on the wounds. . Do not douche, use tampons, or have sex until your doctor says it is okay. . Take your pain medicine when you need it. The  medicine may not work as well if the pain is bad.  Take the medicines you were taking before surgery. Other medications you will need are pain medications (Norco or Percocet) and nausea medications (Zofran).   AMBULATORY SURGERY  DISCHARGE INSTRUCTIONS   1) The drugs that you were given will stay in your system until tomorrow so for the next 24 hours you should not:  A) Drive an automobile B) Make any legal decisions C) Drink any alcoholic beverage   2) You may resume regular meals tomorrow.  Today it is better to start with liquids and gradually work up to solid foods.  You may eat anything you prefer, but it is better to start with liquids, then soup and crackers, and gradually work up to solid foods.   3) Please notify your doctor immediately if you have any unusual bleeding, trouble breathing, redness and pain at the surgery site, drainage, fever, or pain not relieved by medication.    4) Additional Instructions:        Please contact your physician with any problems or Same Day Surgery at [phone], Monday through Friday 6 am to 4 pm, or [place] [facility] [place] Main number at [phone].   

## 2021-03-21 NOTE — Anesthesia Preprocedure Evaluation (Signed)
Anesthesia Evaluation  Patient identified by MRN, date of birth, ID band Patient awake    Reviewed: Allergy & Precautions, NPO status , Patient's Chart, lab work & pertinent test results  History of Anesthesia Complications Negative for: history of anesthetic complications  Airway Mallampati: III  TM Distance: >3 FB Neck ROM: Full    Dental   Braces:   Pulmonary neg pulmonary ROS, neg sleep apnea, neg COPD,    breath sounds clear to auscultation- rhonchi (-) wheezing      Cardiovascular Exercise Tolerance: Good (-) hypertension(-) CAD, (-) Past MI, (-) Cardiac Stents and (-) CABG  Rhythm:Regular Rate:Normal - Systolic murmurs and - Diastolic murmurs    Neuro/Psych neg Seizures negative neurological ROS  negative psych ROS   GI/Hepatic Neg liver ROS, GERD  ,  Endo/Other  negative endocrine ROSneg diabetes  Renal/GU Renal disease: hx of nephrolithiasis.     Musculoskeletal  (+) Arthritis ,   Abdominal (+) + obese,   Peds  Hematology  (+) anemia ,   Anesthesia Other Findings Past Medical History: No date: Anemia No date: Arthritis     Comment:  left ankle 07/23/2019: Breast cancer ([place])     Comment:  right breast- [facility] No date: Cancer ([place])     Comment:  breast cancer No date: Family history of cervical cancer No date: Family history of lung cancer No date: Frequency No date: GERD (gastroesophageal reflux disease) No date: History of kidney stones     Comment:  h/o No date: Obesity No date: Personal history of chemotherapy No date: Right flank pain   Reproductive/Obstetrics                             Anesthesia Physical Anesthesia Plan  ASA: II  Anesthesia Plan: General   Post-op Pain Management:    Induction: Intravenous  PONV Risk Score and Plan: 2 and Ondansetron, Dexamethasone and Midazolam  Airway Management Planned: Oral ETT  Additional Equipment:   Intra-op  Plan:   Post-operative Plan: Extubation in OR  Informed Consent: I have reviewed the patients History and Physical, chart, labs and discussed the procedure including the risks, benefits and alternatives for the proposed anesthesia with the patient or authorized representative who has indicated his/her understanding and acceptance.     Dental advisory given  Plan Discussed with: CRNA and Anesthesiologist  Anesthesia Plan Comments:         Anesthesia Quick Evaluation

## 2021-03-21 NOTE — Interval H&P Note (Signed)
History and Physical Interval Note:  03/21/2021 7:26 AM  Jessica [name]  has presented today for surgery, with the diagnosis of estrogen , positive breast cancer.  The various methods of treatment have been discussed with the patient and family. After consideration of risks, benefits and other options for treatment, the patient has consented to  Procedure(s): XI ROBOTIC ASSISTED TOTAL HYSTERECTOMY WITH BILATERAL SALPINGO OOPHORECTOMY (Bilateral) as a surgical intervention.  The patient's history has been reviewed, patient examined, no change in status, stable for surgery.  I have reviewed the patient's chart and labs.  Questions were answered to the patient's satisfaction.     Benjaman Kindler

## 2021-03-22 ENCOUNTER — Encounter: Payer: Self-pay | Admitting: Obstetrics and Gynecology

## 2021-03-25 LAB — SURGICAL PATHOLOGY

## 2021-05-07 ENCOUNTER — Other Ambulatory Visit: Payer: Self-pay | Admitting: Oncology

## 2021-06-09 ENCOUNTER — Encounter: Payer: Self-pay | Admitting: Oncology

## 2021-06-11 ENCOUNTER — Other Ambulatory Visit: Payer: Self-pay | Admitting: Oncology

## 2021-06-12 ENCOUNTER — Encounter: Payer: Self-pay | Admitting: Oncology

## 2021-06-16 ENCOUNTER — Encounter: Payer: Self-pay | Admitting: Oncology

## 2021-06-16 ENCOUNTER — Inpatient Hospital Stay (HOSPITAL_BASED_OUTPATIENT_CLINIC_OR_DEPARTMENT_OTHER): Payer: 59 | Admitting: Oncology

## 2021-06-16 ENCOUNTER — Inpatient Hospital Stay: Payer: 59

## 2021-06-16 ENCOUNTER — Inpatient Hospital Stay: Payer: 59 | Attending: Oncology

## 2021-06-16 VITALS — BP 115/76 | HR 78 | Temp 98.7°F | Wt 218.0 lb

## 2021-06-16 DIAGNOSIS — C50311 Malignant neoplasm of lower-inner quadrant of right female breast: Secondary | ICD-10-CM | POA: Diagnosis not present

## 2021-06-16 DIAGNOSIS — Z170 Estrogen receptor positive status [ER+]: Secondary | ICD-10-CM

## 2021-06-16 DIAGNOSIS — Z79811 Long term (current) use of aromatase inhibitors: Secondary | ICD-10-CM | POA: Insufficient documentation

## 2021-06-16 DIAGNOSIS — C773 Secondary and unspecified malignant neoplasm of axilla and upper limb lymph nodes: Secondary | ICD-10-CM | POA: Insufficient documentation

## 2021-06-16 DIAGNOSIS — Z9221 Personal history of antineoplastic chemotherapy: Secondary | ICD-10-CM | POA: Insufficient documentation

## 2021-06-16 DIAGNOSIS — M79605 Pain in left leg: Secondary | ICD-10-CM

## 2021-06-16 DIAGNOSIS — M79604 Pain in right leg: Secondary | ICD-10-CM | POA: Diagnosis not present

## 2021-06-16 DIAGNOSIS — R519 Headache, unspecified: Secondary | ICD-10-CM | POA: Insufficient documentation

## 2021-06-16 DIAGNOSIS — Z90710 Acquired absence of both cervix and uterus: Secondary | ICD-10-CM | POA: Insufficient documentation

## 2021-06-16 DIAGNOSIS — Z90722 Acquired absence of ovaries, bilateral: Secondary | ICD-10-CM | POA: Diagnosis not present

## 2021-06-16 DIAGNOSIS — Z79899 Other long term (current) drug therapy: Secondary | ICD-10-CM | POA: Insufficient documentation

## 2021-06-16 DIAGNOSIS — G8929 Other chronic pain: Secondary | ICD-10-CM

## 2021-06-16 LAB — COMPREHENSIVE METABOLIC PANEL
ALT: 21 U/L (ref 0–44)
AST: 23 U/L (ref 15–41)
Albumin: 4.1 g/dL (ref 3.5–5.0)
Alkaline Phosphatase: 91 U/L (ref 38–126)
Anion gap: 8 (ref 5–15)
BUN: 15 mg/dL (ref 6–20)
CO2: 30 mmol/L (ref 22–32)
Calcium: 9.1 mg/dL (ref 8.9–10.3)
Chloride: 101 mmol/L (ref 98–111)
Creatinine, Ser: 0.78 mg/dL (ref 0.44–1.00)
GFR, Estimated: >60 mL/min (ref >60)
Glucose, Bld: 120 mg/dL — ABNORMAL HIGH (ref 70–99)
Potassium: 3.6 mmol/L (ref 3.5–5.1)
Sodium: 139 mmol/L (ref 135–145)
Total Bilirubin: 0.7 mg/dL (ref 0.3–1.2)
Total Protein: 7.6 g/dL (ref 6.5–8.1)

## 2021-06-16 LAB — CBC WITH DIFFERENTIAL/PLATELET
Abs Immature Granulocytes: 0.02 10*3/uL (ref 0.00–0.07)
Basophils Absolute: 0.1 10*3/uL (ref 0.0–0.1)
Basophils Relative: 1 %
Eosinophils Absolute: 0.2 10*3/uL (ref 0.0–0.5)
Eosinophils Relative: 2 %
HCT: 37.4 % (ref 36.0–46.0)
Hemoglobin: 12.1 g/dL (ref 12.0–15.0)
Immature Granulocytes: 0 %
Lymphocytes Relative: 37 %
Lymphs Abs: 3.2 10*3/uL (ref 0.7–4.0)
MCH: 28.7 pg (ref 26.0–34.0)
MCHC: 32.4 g/dL (ref 30.0–36.0)
MCV: 88.6 fL (ref 80.0–100.0)
Monocytes Absolute: 0.5 10*3/uL (ref 0.1–1.0)
Monocytes Relative: 6 %
Neutro Abs: 4.6 10*3/uL (ref 1.7–7.7)
Neutrophils Relative %: 54 %
Platelets: 271 10*3/uL (ref 150–400)
RBC: 4.22 MIL/uL (ref 3.87–5.11)
RDW: 12.9 % (ref 11.5–15.5)
WBC: 8.7 10*3/uL (ref 4.0–10.5)
nRBC: 0 % (ref 0.0–0.2)

## 2021-06-16 MED ORDER — ZOLEDRONIC ACID 4 MG/100ML IV SOLN
4.0000 mg | Freq: Once | INTRAVENOUS | Status: AC
  Administered 2021-06-16: 4 mg via INTRAVENOUS
  Filled 2021-06-16: qty 100

## 2021-06-16 MED ORDER — SODIUM CHLORIDE 0.9 % IV SOLN
INTRAVENOUS | Status: DC
  Filled 2021-06-16: qty 250

## 2021-06-16 NOTE — Progress Notes (Signed)
Jessica [name]   [phone] [phone]   Clinic Follow up Note   Patient Care Team: Sharyne Peach, MD as PCP - General (Family Medicine) Earlie Server, MD as Consulting Physician (Oncology) Benjamine Sprague, DO as Consulting Physician (Surgery) Rico Junker, RN as Registered Nurse Noreene Filbert, MD as Radiation Oncologist (Radiation Oncology)  Date of Service:  06/16/2021  CHIEF COMPLAINT: F/u of right breast cancer   SUMMARY OF ONCOLOGIC HISTORY: Oncology History Overview Note  Cancer Staging Malignant neoplasm of lower-inner quadrant of right breast of female, estrogen receptor positive ([place]) Staging form: Breast, AJCC 8th Edition - Clinical stage from 07/26/2019: Stage IIB (cT2, cN1, cM0, G3, ER+, PR+, HER2-) - Unsigned    Malignant neoplasm of lower-inner quadrant of right breast of female, estrogen receptor positive ([place])  07/20/2019 Mammogram   Mammogram 07/20/19  IMPRESSION: Suspicious palpable right breast mass 3.4 x 2.5 x 3.4 cm 5 o'clock position 3 cm from nipple.   Suspicious 1.6cm cortically thickened right axillary lymph node.   07/26/2019 Initial Diagnosis   DIAGNOSIS: 07/26/19 A. RIGHT BREAST, 5:00, 3CMFN; ULTRASOUND-GUIDED NEEDLE CORE BIOPSY:  - INVASIVE MAMMARY CARCINOMA, NO SPECIAL TYPE.    07/26/2019 Receptors her2   BREAST BIOMARKER TESTS  Estrogen Receptor (ER) Status: POSITIVE                       Percentage of cells with nuclear positivity: 51-90%                       Average intensity of staining: Strong   Progesterone Receptor (PgR) Status: POSITIVE                       Percentage of cells with nuclear positivity:  Greater than 90%                       Average intensity of staining: Strong   HER2 (by immunohistochemistry): NEGATIVE (Score 1+)    08/05/2019 Initial Diagnosis   Invasive carcinoma of breast ([place])   08/17/2019 Surgery   INSERTION PORT-A-CATH by Dr. Lysle Pearl 08/17/19    08/18/2019 -  Chemotherapy    AC q2weeks for 4 cycles starting 08/18/19 followed by weekly Taxol for 12 weeks     Genetic Testing   Negative genetic testing. No pathogenic variants identified on the [facility] Common Hereditary Cancers Panel. The report date is 11/13/2019.   The Common Hereditary Cancers Panel offered by [facility] includes sequencing and/or deletion duplication testing of the following 48 genes: APC, ATM, AXIN2, BARD1, BMPR1A, BRCA1, BRCA2, BRIP1, CDH1, CDKN2A (p14ARF), CDKN2A (p16INK4a), CKD4, CHEK2, CTNNA1, DICER1, EPCAM (Deletion/duplication testing only), GREM1 (promoter region deletion/duplication testing only), KIT, MEN1, MLH1, MSH2, MSH3, MSH6, MUTYH, NBN, NF1, NHTL1, PALB2, PDGFRA, PMS2, POLD1, POLE, PTEN, RAD50, RAD51C, RAD51D, RNF43, SDHB, SDHC, SDHD, SMAD4, SMARCA4. STK11, TP53, TSC1, TSC2, and VHL.  The following genes were evaluated for sequence changes only: SDHA and HOXB13 c.251G>A variant only.      CANCER THERAPY:  Neoadjuvant chemo ddAC starting 08/18/19 for 4 cycles followed by weekly Taxol x 12 Finished chemotherapy on 01/01/2020.   02/01/2020 s/p right lumpectomy and axillary lymph node dissection. ypT1c ypN1a(sn)  ER/PR positive, HER2 negative, residual invasive mammary carcinoma, grade 3.  1/3 sentinel lymph node positive and 2/10 right axillary lymph nodes positive.  DCIS present. Margin was negative for invasive carcinoma and DCIS Finished adjuvant radiation on 04/30/2020 #  July 2021 started ovarian suppression with Zoladex monthly, started Arimidex 1 mg daily. ##Port-A-Cath in place, she is now 1 year after finished surgery treatment.   She would like to take out Mediport in June 2022..- Dr. Lysle Pearl 03/11/21 last dose of Zoledex 03/21/2021 Ovarian suppression via TAH-BSO  INTERVAL HISTORY:  Jessica [name] presents to follow-up for breast cancer. She takes Arimidex 1 mg daily  03/21/2021 S/p total hysterectomy with BSO. She tolerated procedure well and recovers well.  lower extremity  pain, left worse than right, chronic and intermittent.  She takes vitamin D supplementation. Not on calcium.     Review of Systems  Constitutional:  Positive for fatigue. Negative for appetite change, chills and fever.  HENT:   Negative for hearing loss and voice change.   Eyes:  Negative for eye problems.  Respiratory:  Negative for chest tightness and cough.   Cardiovascular:  Negative for chest pain.  Gastrointestinal:  Negative for abdominal distention, abdominal pain and blood in stool.  Endocrine: Positive for hot flashes.  Genitourinary:  Negative for difficulty urinating and frequency.   Musculoskeletal:  Negative for arthralgias.       Bilateral lower extremity thigh pain, left worse than right.  Skin:  Negative for itching and rash.  Neurological:  Negative for extremity weakness and numbness.  Hematological:  Negative for adenopathy.  Psychiatric/Behavioral:  Negative for confusion and sleep disturbance.    MEDICAL HISTORY:  Past Medical History:  Diagnosis Date   Anemia    Arthritis    left ankle   Breast cancer ([place]) 07/23/2019   right breast- [facility]   Cancer ([place])    breast cancer   Family history of cervical cancer    Family history of lung cancer    Frequency    GERD (gastroesophageal reflux disease)    History of kidney stones    h/o   Obesity    Personal history of chemotherapy    Right flank pain     SURGICAL HISTORY: Past Surgical History:  Procedure Laterality Date   AXILLARY LYMPH NODE DISSECTION Right 02/01/2020   Procedure: AXILLARY LYMPH NODE DISSECTION;  Surgeon: Benjamine Sprague, DO;  Location: [facility] ORS;  Service: General;  Laterality: Right;   BREAST BIOPSY Right 2020   [facility]   BREAST LUMPECTOMY Right 02/01/2020   pt has neo adj chemo and post radation with LN removed   CESAREAN SECTION     x 2   CHOLECYSTECTOMY     DILATION AND CURETTAGE OF UTERUS     LITHOTRIPSY  AROUND 2010   PORTACATH PLACEMENT Left 08/17/2019   Procedure: INSERTION  PORT-A-CATH;  Surgeon: Benjamine Sprague, DO;  Location: [facility] ORS;  Service: General;  Laterality: Left;   ROBOTIC ASSISTED TOTAL HYSTERECTOMY WITH BILATERAL SALPINGO OOPHERECTOMY Bilateral 03/21/2021   Procedure: XI ROBOTIC ASSISTED TOTAL HYSTERECTOMY WITH BILATERAL SALPINGO OOPHORECTOMY;  Surgeon: Benjaman Kindler, MD;  Location: [facility] ORS;  Service: Gynecology;  Laterality: Bilateral;   TUBAL LIGATION      I have reviewed the social history and family history with the patient and they are unchanged from previous note.  ALLERGIES:  has No Known Allergies.  MEDICATIONS:  Current Outpatient Medications  Medication Sig Dispense Refill   anastrozole (ARIMIDEX) 1 MG tablet TAKE 1 TABLET BY MOUTH EVERY DAY 90 tablet 1   Aspirin-Acetaminophen-Caffeine (GOODYS EXTRA STRENGTH) 500-325-65 MG PACK Take 1 packet by mouth daily as needed (pain).     fluticasone (FLONASE) 50 MCG/ACT nasal spray SPRAY 1 SPRAY INTO  BOTH NOSTRILS DAILY. 16 mL 2   Multiple Vitamins-Minerals (MULTIVITAMIN ADULT) CHEW Chew 2 each by mouth daily.     omeprazole (PRILOSEC) 20 MG capsule TAKE 1 CAPSULE BY MOUTH EVERY DAY 90 capsule 0   docusate sodium (COLACE) 100 MG capsule Take 1 capsule (100 mg total) by mouth 2 (two) times daily. To keep stools soft (Patient not taking: Reported on 06/16/2021) 30 capsule 0   gabapentin (NEURONTIN) 800 MG tablet Take 1 tablet (800 mg total) by mouth at bedtime for 14 days. Take nightly for 3 days, then up to 14 days as needed 14 tablet 0   lidocaine-prilocaine (EMLA) cream APPLY 1 APPLICATION TOPICALLY AS NEEDED. APPLY SMALL AMOUNT TO PORT SITE 1-2HRS BEFORE APPOINTMENT 30 g 2   oxyCODONE (OXY IR/ROXICODONE) 5 MG immediate release tablet Take 1 tablet (5 mg total) by mouth every 4 (four) hours as needed for severe pain. 20 tablet 0   No current facility-administered medications for this visit.   Facility-Administered Medications Ordered in Other Visits  Medication Dose Route Frequency Provider Last  Rate Last Admin   0.9 %  sodium chloride infusion   Intravenous Continuous Earlie Server, MD   Stopped at 06/16/21 1536   sodium chloride flush (NS) 0.9 % injection 10 mL  10 mL Intravenous Once Earlie Server, MD        PHYSICAL EXAMINATION: ECOG PERFORMANCE STATUS: 1 - Symptomatic but completely ambulatory  Vitals:   06/16/21 1421  BP: 115/76  Pulse: 78  Temp: 98.7 F (37.1 C)  SpO2: 100%   Filed Weights   06/16/21 1421  Weight: 218 lb (98.9 kg)   Physical Exam Constitutional:      General: She is not in acute distress.    Appearance: She is not diaphoretic.  HENT:     Head: Normocephalic and atraumatic.     Nose: Nose normal.     Mouth/Throat:     Pharynx: No oropharyngeal exudate.  Eyes:     General: No scleral icterus.    Pupils: Pupils are equal, round, and reactive to light.  Cardiovascular:     Rate and Rhythm: Normal rate and regular rhythm.     Heart sounds: No murmur heard. Pulmonary:     Effort: Pulmonary effort is normal. No respiratory distress.     Breath sounds: No rales.  Chest:     Chest wall: No tenderness.  Abdominal:     General: There is no distension.     Palpations: Abdomen is soft.     Tenderness: There is no abdominal tenderness.  Musculoskeletal:        General: Normal range of motion.     Cervical back: Normal range of motion and neck supple.  Skin:    General: Skin is warm and dry.     Findings: No erythema.  Neurological:     Mental Status: She is alert and oriented to person, place, and time.     Cranial Nerves: No cranial nerve deficit.     Motor: No abnormal muscle tone.     Coordination: Coordination normal.  Psychiatric:        Mood and Affect: Affect normal.     LABORATORY DATA:  I have reviewed the data as listed CBC Latest Ref Rng & Units 06/16/2021 03/17/2021 03/10/2021  WBC 4.0 - 10.5 K/uL 8.7 5.5 7.8  Hemoglobin 12.0 - 15.0 g/dL 12.1 12.8 12.5  Hematocrit 36.0 - 46.0 % 37.4 39.0 37.9  Platelets 150 - 400 K/uL 271  229 286      CMP Latest Ref Rng & Units 06/16/2021 03/17/2021 03/10/2021  Glucose 70 - 99 mg/dL 120(H) 88 124(H)  BUN 6 - 20 mg/dL '15 9 10  ' Creatinine 0.44 - 1.00 mg/dL 0.78 0.80 0.82  Sodium 135 - 145 mmol/L 139 139 139  Potassium 3.5 - 5.1 mmol/L 3.6 3.8 3.5  Chloride 98 - 111 mmol/L 101 103 103  CO2 22 - 32 mmol/L '30 26 27  ' Calcium 8.9 - 10.3 mg/dL 9.1 9.0 9.0  Total Protein 6.5 - 8.1 g/dL 7.6 - 7.2  Total Bilirubin 0.3 - 1.2 mg/dL 0.7 - 0.6  Alkaline Phos 38 - 126 U/L 91 - 70  AST 15 - 41 U/L 23 - 21  ALT 0 - 44 U/L 21 - 19      RADIOGRAPHIC STUDIES: I have personally reviewed the radiological images as listed and agreed with the findings in the report. No results found.    ASSESSMENT & PLAN:  1. Malignant neoplasm of lower-inner quadrant of right breast of female, estrogen receptor positive ([place])   2. Aromatase inhibitor use   3. Chronic nonintractable headache, unspecified headache type   4. Leg pain, bilateral     Cancer Staging Malignant neoplasm of lower-inner quadrant of right breast of female, estrogen receptor positive ([place]) Staging form: Breast, AJCC 8th Edition - Clinical stage from 07/26/2019: Stage IIB (cT2, cN1, cM0, G3, ER+, PR+, HER2-) - Unsigned   Invasive right breast cancer,  -Status post 4 cycles of dose dense Adriamycin and Cytoxan, s/p weekly Taxol x 12.  Pathology ypT1c ypN1a(sn) 04/30/2020 finished adjuvant radiation. Previously on ovarian suppresion with Zoledex previously, now by surgery Continue Arimidex 73m daily.  Recommend patient to take calcium 12066mdaily + Vitamin D supplementation.  Labs are reviewed and discussed with patient. CA 15-3 and CA 27.29 are both normalized previously, will check at next visit.  Obtain annual diagnostic mammogram in Nov 2022   Persistent chronic headache, 09/30/2020 MRI brain is negative for intracranial metastasis..  Small mucous retention cyst in the bilateral maxillary sinuses.  Questionable chronic sinusitis  which may lead to her chronic headache.  Recommend patient to try intranasal Flonase as needed.  Symptoms are stable.   #Continue adjuvant Zometa every 6 months.  Proceed today..  Recommend patient to continue vitamin D and calcium supplementation. Leg pain, possible due to AI.   #Neuropathy due to chemotherapy, pre-existing sciatica, symptoms are resolved, and she is not on any medication for that.   Follow-up:  follow up lab MD Zometa in 6 months.   All questions were answered. The patient knows to call the clinic with any problems, questions or concerns. No barriers to learning was detected.     ZhEarlie ServerMD 06/16/2021

## 2021-06-16 NOTE — Progress Notes (Signed)
Patient here for oncology follow-up appointment,  concerns of leg pain    

## 2021-06-20 ENCOUNTER — Ambulatory Visit: Payer: 59 | Admitting: Radiation Oncology

## 2021-09-12 ENCOUNTER — Other Ambulatory Visit: Payer: Self-pay | Admitting: Oncology

## 2021-09-20 ENCOUNTER — Encounter: Payer: Self-pay | Admitting: Oncology

## 2021-09-20 MED ORDER — OMEPRAZOLE 20 MG PO CPDR: 20.0000 mg | DELAYED_RELEASE_CAPSULE | Freq: Every day | ORAL | 3 refills | Status: DC

## 2021-09-22 ENCOUNTER — Ambulatory Visit
Admission: RE | Admit: 2021-09-22 | Discharge: 2021-09-22 | Disposition: A | Discharge status: Home / Self Care | Payer: 59 | Source: Ambulatory Visit | Attending: Oncology | Admitting: Oncology

## 2021-09-22 ENCOUNTER — Other Ambulatory Visit: Payer: Self-pay

## 2021-09-22 DIAGNOSIS — Z170 Estrogen receptor positive status [ER+]: Secondary | ICD-10-CM | POA: Insufficient documentation

## 2021-09-22 DIAGNOSIS — C50311 Malignant neoplasm of lower-inner quadrant of right female breast: Secondary | ICD-10-CM | POA: Diagnosis not present

## 2021-09-22 HISTORY — DX: Personal history of irradiation: Z92.3

## 2021-11-06 ENCOUNTER — Encounter: Payer: Self-pay | Admitting: Oncology

## 2021-11-06 ENCOUNTER — Ambulatory Visit
Admission: RE | Admit: 2021-11-06 | Discharge: 2021-11-06 | Disposition: A | Discharge status: Home / Self Care | Payer: Self-pay | Source: Ambulatory Visit | Attending: Oncology | Admitting: Oncology

## 2021-11-06 ENCOUNTER — Telehealth: Payer: Self-pay

## 2021-11-06 DIAGNOSIS — C50311 Malignant neoplasm of lower-inner quadrant of right female breast: Secondary | ICD-10-CM

## 2021-11-06 DIAGNOSIS — N649 Disorder of breast, unspecified: Secondary | ICD-10-CM

## 2021-11-06 DIAGNOSIS — Z170 Estrogen receptor positive status [ER+]: Secondary | ICD-10-CM

## 2021-11-06 NOTE — Telephone Encounter (Signed)
Please advise. CT scan available in care everywhere

## 2021-11-06 NOTE — Telephone Encounter (Signed)
Patient sent Mychart message stating that a breast lesion was seen on CT scan yesterday at [facility]. Images have been requested to be uploaded into pts chart.   Per Dr. Tasia Catchings, this is an incidental finding and will need and MRI breast.   Pt informed of plan via Mychart. Please schedule MRI breast prior to visit in Feb and inform pt of appt.

## 2021-11-06 NOTE — Telephone Encounter (Signed)
Left VM for MRI Breats for [place]. Will double check that it gets done

## 2021-11-07 NOTE — Telephone Encounter (Signed)
Images available in chart.

## 2021-11-08 ENCOUNTER — Encounter: Payer: Self-pay | Admitting: Oncology

## 2021-11-10 ENCOUNTER — Encounter: Payer: Self-pay | Admitting: Oncology

## 2021-11-10 NOTE — Telephone Encounter (Signed)
Pt scheduled for MRI on 1/17

## 2021-11-18 ENCOUNTER — Ambulatory Visit
Admission: RE | Admit: 2021-11-18 | Discharge: 2021-11-18 | Disposition: A | Discharge status: Home / Self Care | Payer: Managed Care, Other (non HMO) | Source: Ambulatory Visit | Attending: Oncology | Admitting: Oncology

## 2021-11-18 DIAGNOSIS — N649 Disorder of breast, unspecified: Secondary | ICD-10-CM | POA: Diagnosis present

## 2021-11-18 MED ORDER — GADOBUTROL 1 MMOL/ML IV SOLN
9.0000 mL | Freq: Once | INTRAVENOUS | Status: AC | PRN
  Administered 2021-11-18: 9 mL via INTRAVENOUS

## 2021-12-15 ENCOUNTER — Inpatient Hospital Stay: Payer: Managed Care, Other (non HMO) | Attending: Oncology

## 2021-12-15 ENCOUNTER — Other Ambulatory Visit: Payer: Self-pay

## 2021-12-15 ENCOUNTER — Encounter: Payer: Self-pay | Admitting: Radiation Oncology

## 2021-12-15 ENCOUNTER — Ambulatory Visit
Admission: RE | Admit: 2021-12-15 | Discharge: 2021-12-15 | Disposition: A | Discharge status: Home / Self Care | Payer: Managed Care, Other (non HMO) | Source: Ambulatory Visit | Attending: Radiation Oncology | Admitting: Radiation Oncology

## 2021-12-15 VITALS — BP 106/80 | HR 71 | Temp 97.9°F | Resp 16 | Wt 216.8 lb

## 2021-12-15 DIAGNOSIS — C50311 Malignant neoplasm of lower-inner quadrant of right female breast: Secondary | ICD-10-CM | POA: Insufficient documentation

## 2021-12-15 DIAGNOSIS — C50911 Malignant neoplasm of unspecified site of right female breast: Secondary | ICD-10-CM

## 2021-12-15 DIAGNOSIS — Z923 Personal history of irradiation: Secondary | ICD-10-CM | POA: Diagnosis not present

## 2021-12-15 DIAGNOSIS — Z170 Estrogen receptor positive status [ER+]: Secondary | ICD-10-CM | POA: Diagnosis not present

## 2021-12-15 DIAGNOSIS — Z79811 Long term (current) use of aromatase inhibitors: Secondary | ICD-10-CM | POA: Diagnosis not present

## 2021-12-15 DIAGNOSIS — C773 Secondary and unspecified malignant neoplasm of axilla and upper limb lymph nodes: Secondary | ICD-10-CM

## 2021-12-15 LAB — CBC WITH DIFFERENTIAL/PLATELET
Abs Immature Granulocytes: 0.01 10*3/uL (ref 0.00–0.07)
Basophils Absolute: 0 10*3/uL (ref 0.0–0.1)
Basophils Relative: 1 %
Eosinophils Absolute: 0.1 10*3/uL (ref 0.0–0.5)
Eosinophils Relative: 2 %
HCT: 39.1 % (ref 36.0–46.0)
Hemoglobin: 12.6 g/dL (ref 12.0–15.0)
Immature Granulocytes: 0 %
Lymphocytes Relative: 43 %
Lymphs Abs: 2.7 10*3/uL (ref 0.7–4.0)
MCH: 28.4 pg (ref 26.0–34.0)
MCHC: 32.2 g/dL (ref 30.0–36.0)
MCV: 88.3 fL (ref 80.0–100.0)
Monocytes Absolute: 0.5 10*3/uL (ref 0.1–1.0)
Monocytes Relative: 8 %
Neutro Abs: 2.9 10*3/uL (ref 1.7–7.7)
Neutrophils Relative %: 46 %
Platelets: 274 10*3/uL (ref 150–400)
RBC: 4.43 MIL/uL (ref 3.87–5.11)
RDW: 12.9 % (ref 11.5–15.5)
WBC: 6.2 10*3/uL (ref 4.0–10.5)
nRBC: 0 % (ref 0.0–0.2)

## 2021-12-15 LAB — COMPREHENSIVE METABOLIC PANEL
ALT: 19 U/L (ref 0–44)
AST: 20 U/L (ref 15–41)
Albumin: 4 g/dL (ref 3.5–5.0)
Alkaline Phosphatase: 91 U/L (ref 38–126)
Anion gap: 7 (ref 5–15)
BUN: 11 mg/dL (ref 6–20)
CO2: 29 mmol/L (ref 22–32)
Calcium: 9.1 mg/dL (ref 8.9–10.3)
Chloride: 102 mmol/L (ref 98–111)
Creatinine, Ser: 0.73 mg/dL (ref 0.44–1.00)
GFR, Estimated: >60 mL/min (ref >60)
Glucose, Bld: 102 mg/dL — ABNORMAL HIGH (ref 70–99)
Potassium: 3.6 mmol/L (ref 3.5–5.1)
Sodium: 138 mmol/L (ref 135–145)
Total Bilirubin: 0.3 mg/dL (ref 0.3–1.2)
Total Protein: 7.8 g/dL (ref 6.5–8.1)

## 2021-12-15 NOTE — Progress Notes (Signed)
Radiation Oncology Follow up Note  Name: Jessica [name]   Date:   12/15/2021 MRN:  [id number] DOB: [birth year]    This 46 y.o. female presents to the clinic today for 1-1/2-year follow-up status post whole breast radiation to her right breast and peripheral lymphatics for stage IIb (T2 N1 M0) ER/PR positive invasive mammary carcinoma.  REFERRING PROVIDER: Sharyne Peach, MD  HPI: Patient is a 46 year old female now at a year and a half having completed whole breast and peripheral lymphatic radiation to her right breast for stage IIb invasive mammary carcinoma ER and PR positive.  Seen today in routine follow-up she is doing well.  She specifically denies breast tenderness cough or bone pain..  She had mammograms back in November which I have reviewed were BI-RADS 2 benign.  She also had a CT scan for work-up of possible kidney stone.  They noticed abnormality in her breast ordered an MRI scan showing lumpectomy scar with no MRI evidence of malignancy in either breast.  She is currently on Arimidex tolerating it well without side effect.  COMPLICATIONS OF TREATMENT: none  FOLLOW UP COMPLIANCE: keeps appointments   PHYSICAL EXAM:  BP 106/80 (BP Location: Left Arm, Patient Position: Sitting)    Pulse 71    Temp 97.9 F (36.6 C) (Tympanic)    Resp 16    Wt 216 lb 12.8 oz (98.3 kg)    SpO2 100%    BMI 39.65 kg/m  Lungs are clear to A&P cardiac examination essentially unremarkable with regular rate and rhythm. No dominant mass or nodularity is noted in either breast in 2 positions examined. Incision is well-healed. No axillary or supraclavicular adenopathy is appreciated. Cosmetic result is excellent.  Well-developed well-nourished patient in NAD. HEENT reveals PERLA, EOMI, discs not visualized.  Oral cavity is clear. No oral mucosal lesions are identified. Neck is clear without evidence of cervical or supraclavicular adenopathy. Lungs are clear to A&P. Cardiac examination is essentially  unremarkable with regular rate and rhythm without murmur rub or thrill. Abdomen is benign with no organomegaly or masses noted. Motor sensory and DTR levels are equal and symmetric in the upper and lower extremities. Cranial nerves II through XII are grossly intact. Proprioception is intact. No peripheral adenopathy or edema is identified. No motor or sensory levels are noted. Crude visual fields are within normal range.  RADIOLOGY RESULTS: Mammogram and MRI of breast reviewed compatible with above-stated findings  PLAN: Present time she is now a year and a half out with no evidence of disease.  I am pleased with her overall progress.  I have asked sked to see her back in 1 year for follow-up.  Patient is to call with any concerns.  She continues on Arimidex without side effect.  I would like to take this opportunity to thank you for allowing me to participate in the care of your patient.Noreene Filbert, MD

## 2021-12-16 LAB — CANCER ANTIGEN 15-3: CA 15-3: 16.9 U/mL (ref 0.0–25.0)

## 2021-12-16 LAB — CANCER ANTIGEN 27.29: CA 27.29: 16.9 U/mL (ref 0.0–38.6)

## 2021-12-17 ENCOUNTER — Inpatient Hospital Stay (HOSPITAL_BASED_OUTPATIENT_CLINIC_OR_DEPARTMENT_OTHER): Payer: Managed Care, Other (non HMO) | Admitting: Oncology

## 2021-12-17 ENCOUNTER — Inpatient Hospital Stay: Payer: Managed Care, Other (non HMO)

## 2021-12-17 ENCOUNTER — Other Ambulatory Visit: Payer: Self-pay

## 2021-12-17 ENCOUNTER — Encounter: Payer: Self-pay | Admitting: Oncology

## 2021-12-17 VITALS — BP 131/84 | HR 91 | Temp 97.2°F | Resp 18 | Wt 219.9 lb

## 2021-12-17 DIAGNOSIS — C50311 Malignant neoplasm of lower-inner quadrant of right female breast: Secondary | ICD-10-CM

## 2021-12-17 DIAGNOSIS — N200 Calculus of kidney: Secondary | ICD-10-CM

## 2021-12-17 DIAGNOSIS — Z79811 Long term (current) use of aromatase inhibitors: Secondary | ICD-10-CM

## 2021-12-17 DIAGNOSIS — Z170 Estrogen receptor positive status [ER+]: Secondary | ICD-10-CM

## 2021-12-17 MED ORDER — ZOLEDRONIC ACID 4 MG/100ML IV SOLN
4.0000 mg | Freq: Once | INTRAVENOUS | Status: AC
  Administered 2021-12-17: 4 mg via INTRAVENOUS
  Filled 2021-12-17: qty 100

## 2021-12-17 MED ORDER — SODIUM CHLORIDE 0.9 % IV SOLN
INTRAVENOUS | Status: DC
  Filled 2021-12-17: qty 250

## 2021-12-17 NOTE — Progress Notes (Signed)
Hematology/Oncology Progress note [phone] [phone]     Patient Care Team: Sharyne Peach, MD as PCP - General (Family Medicine) Earlie Server, MD as Consulting Physician (Oncology) Benjamine Sprague, DO as Consulting Physician (Surgery) Rico Junker, RN as Registered Nurse Noreene Filbert, MD as Radiation Oncologist (Radiation Oncology)  Date of Service:  12/17/2021  CHIEF COMPLAINT: F/u of right breast cancer   SUMMARY OF ONCOLOGIC HISTORY: Oncology History Overview Note  Cancer Staging Malignant neoplasm of lower-inner quadrant of right breast of female, estrogen receptor positive ([place]) Staging form: Breast, AJCC 8th Edition - Clinical stage from 07/26/2019: Stage IIB (cT2, cN1, cM0, G3, ER+, PR+, HER2-) - Unsigned    Malignant neoplasm of lower-inner quadrant of right breast of female, estrogen receptor positive ([place])  07/20/2019 Mammogram   Mammogram 07/20/19  IMPRESSION: Suspicious palpable right breast mass 3.4 x 2.5 x 3.4 cm 5 o'clock position 3 cm from nipple.   Suspicious 1.6cm cortically thickened right axillary lymph node.   07/26/2019 Initial Diagnosis   DIAGNOSIS: 07/26/19 A. RIGHT BREAST, 5:00, 3CMFN; ULTRASOUND-GUIDED NEEDLE CORE BIOPSY:  - INVASIVE MAMMARY CARCINOMA, NO SPECIAL TYPE.    07/26/2019 Receptors her2   BREAST BIOMARKER TESTS  Estrogen Receptor (ER) Status: POSITIVE                       Percentage of cells with nuclear positivity: 51-90%                       Average intensity of staining: Strong   Progesterone Receptor (PgR) Status: POSITIVE                       Percentage of cells with nuclear positivity:  Greater than 90%                       Average intensity of staining: Strong   HER2 (by immunohistochemistry): NEGATIVE (Score 1+)    08/05/2019 Initial Diagnosis   Invasive carcinoma of breast ([place])   08/17/2019 Surgery   INSERTION PORT-A-CATH by Dr. Lysle Pearl 08/17/19    08/18/2019 -  Chemotherapy   AC q2weeks  for 4 cycles starting 08/18/19 followed by weekly Taxol for 12 weeks     Genetic Testing   Negative genetic testing. No pathogenic variants identified on the [facility] Common Hereditary Cancers Panel. The report date is 11/13/2019.   The Common Hereditary Cancers Panel offered by [facility] includes sequencing and/or deletion duplication testing of the following 48 genes: APC, ATM, AXIN2, BARD1, BMPR1A, BRCA1, BRCA2, BRIP1, CDH1, CDKN2A (p14ARF), CDKN2A (p16INK4a), CKD4, CHEK2, CTNNA1, DICER1, EPCAM (Deletion/duplication testing only), GREM1 (promoter region deletion/duplication testing only), KIT, MEN1, MLH1, MSH2, MSH3, MSH6, MUTYH, NBN, NF1, NHTL1, PALB2, PDGFRA, PMS2, POLD1, POLE, PTEN, RAD50, RAD51C, RAD51D, RNF43, SDHB, SDHC, SDHD, SMAD4, SMARCA4. STK11, TP53, TSC1, TSC2, and VHL.  The following genes were evaluated for sequence changes only: SDHA and HOXB13 c.251G>A variant only.      CANCER THERAPY:  Neoadjuvant chemo ddAC starting 08/18/19 for 4 cycles followed by weekly Taxol x 12 Finished chemotherapy on 01/01/2020.   02/01/2020 s/p right lumpectomy and axillary lymph node dissection. ypT1c ypN1a(sn)  ER/PR positive, HER2 negative, residual invasive mammary carcinoma, grade 3.  1/3 sentinel lymph node positive and 2/10 right axillary lymph nodes positive.  DCIS present. Margin was negative for invasive carcinoma and DCIS Finished adjuvant radiation on 04/30/2020 #July 2021 started ovarian suppression with Zoladex  monthly, started Arimidex 1 mg daily. ##Port-A-Cath in place, she is now 1 year after finished surgery treatment.   She would like to take out Mediport in June 2022..- Dr. Lysle Pearl 03/11/21 last dose of Zoledex 03/21/2021 Ovarian suppression via TAH-BSO   Persistent chronic headache, 09/30/2020 MRI brain is negative for intracranial metastasis..  Small mucous retention cyst in the bilateral maxillary sinuses.  Questionable chronic sinusitis which may lead to her chronic headache.   Recommend patient to try intranasal Flonase as needed.   #Neuropathy due to chemotherapy, pre-existing sciatica, symptoms are resolved, and she is not on any neuropathy medication.   Mediport was removed.   INTERVAL HISTORY:  ARAMINTA ZORN presents to follow-up for breast cancer. She takes Arimidex 1 mg daily  Tolerates well.  No new complaints.  She denies any new breast concerns. Recent kidney stone.  She follows up with urology.  There was concern of calcium calculus.  She is currently off calcium supplementation.   Outside CT scan showed a questionable right breast mass. 11/19/2021, bilateral MRI breast with and without contrast showed lumpectomy scar in the central to slightly inner right breast corresponds to the mass seen in the right breast on recent CT.  No MRI evidence of malignancy in either breast.  Review of Systems  Constitutional:  Positive for fatigue. Negative for appetite change, chills and fever.  HENT:   Negative for hearing loss and voice change.   Eyes:  Negative for eye problems.  Respiratory:  Negative for chest tightness and cough.   Cardiovascular:  Negative for chest pain.  Gastrointestinal:  Negative for abdominal distention, abdominal pain and blood in stool.  Endocrine: Positive for hot flashes.  Genitourinary:  Negative for difficulty urinating and frequency.   Musculoskeletal:  Negative for arthralgias.       Bilateral lower extremity thigh pain, left worse than right.  Skin:  Negative for itching and rash.  Neurological:  Negative for extremity weakness and numbness.  Hematological:  Negative for adenopathy.  Psychiatric/Behavioral:  Negative for confusion and sleep disturbance.    MEDICAL HISTORY:  Past Medical History:  Diagnosis Date   Anemia    Arthritis    left ankle   Breast cancer ([place]) 07/23/2019   right breast- [facility]   Cancer ([place])    breast cancer   Family history of cervical cancer    Family history of lung cancer    Frequency     GERD (gastroesophageal reflux disease)    History of kidney stones    h/o   Obesity    Personal history of chemotherapy    Personal history of radiation therapy    Right flank pain     SURGICAL HISTORY: Past Surgical History:  Procedure Laterality Date   AXILLARY LYMPH NODE DISSECTION Right 02/01/2020   Procedure: AXILLARY LYMPH NODE DISSECTION;  Surgeon: Benjamine Sprague, DO;  Location: [facility] ORS;  Service: General;  Laterality: Right;   BREAST BIOPSY Right 2020   [facility]   BREAST LUMPECTOMY Right 02/01/2020   pt has neo adj chemo and post radation with LN removed   CESAREAN SECTION     x 2   CHOLECYSTECTOMY     DILATION AND CURETTAGE OF UTERUS     LITHOTRIPSY  AROUND 2010   PORTACATH PLACEMENT Left 08/17/2019   Procedure: INSERTION PORT-A-CATH;  Surgeon: Benjamine Sprague, DO;  Location: [facility] ORS;  Service: General;  Laterality: Left;   ROBOTIC ASSISTED TOTAL HYSTERECTOMY WITH BILATERAL SALPINGO OOPHERECTOMY Bilateral 03/21/2021  Procedure: XI ROBOTIC ASSISTED TOTAL HYSTERECTOMY WITH BILATERAL SALPINGO OOPHORECTOMY;  Surgeon: Benjaman Kindler, MD;  Location: [facility] ORS;  Service: Gynecology;  Laterality: Bilateral;   TUBAL LIGATION      I have reviewed the social history and family history with the patient and they are unchanged from previous note.  ALLERGIES:  has No Known Allergies.  MEDICATIONS:  Current Outpatient Medications  Medication Sig Dispense Refill   anastrozole (ARIMIDEX) 1 MG tablet TAKE 1 TABLET BY MOUTH EVERY DAY 90 tablet 1   fluticasone (FLONASE) 50 MCG/ACT nasal spray SPRAY 1 SPRAY INTO BOTH NOSTRILS DAILY. 16 mL 2   Multiple Vitamins-Minerals (MULTIVITAMIN ADULT) CHEW Chew 2 each by mouth daily.     omeprazole (PRILOSEC) 20 MG capsule Take 1 capsule (20 mg total) by mouth daily. 30 capsule 3   Aspirin-Acetaminophen-Caffeine (GOODYS EXTRA STRENGTH) 500-325-65 MG PACK Take 1 packet by mouth daily as needed (pain). (Patient not taking: Reported on 12/17/2021)     No  current facility-administered medications for this visit.   Facility-Administered Medications Ordered in Other Visits  Medication Dose Route Frequency Provider Last Rate Last Admin   sodium chloride flush (NS) 0.9 % injection 10 mL  10 mL Intravenous Once Earlie Server, MD        PHYSICAL EXAMINATION: ECOG PERFORMANCE STATUS: 1 - Symptomatic but completely ambulatory  Vitals:   12/17/21 1334  BP: 131/84  Pulse: 91  Resp: 18  Temp: (!) 97.2 F (36.2 C)   Filed Weights   12/17/21 1334  Weight: 219 lb 14.4 oz (99.7 kg)   Physical Exam Constitutional:      General: She is not in acute distress.    Appearance: She is not diaphoretic.  HENT:     Head: Normocephalic and atraumatic.     Nose: Nose normal.     Mouth/Throat:     Pharynx: No oropharyngeal exudate.  Eyes:     General: No scleral icterus.    Pupils: Pupils are equal, round, and reactive to light.  Cardiovascular:     Rate and Rhythm: Normal rate and regular rhythm.     Heart sounds: No murmur heard. Pulmonary:     Effort: Pulmonary effort is normal. No respiratory distress.     Breath sounds: No rales.  Chest:     Chest wall: No tenderness.  Abdominal:     General: There is no distension.     Palpations: Abdomen is soft.     Tenderness: There is no abdominal tenderness.  Musculoskeletal:        General: Normal range of motion.     Cervical back: Normal range of motion and neck supple.  Skin:    General: Skin is warm and dry.     Findings: No erythema.  Neurological:     Mental Status: She is alert and oriented to person, place, and time.     Cranial Nerves: No cranial nerve deficit.     Motor: No abnormal muscle tone.     Coordination: Coordination normal.  Psychiatric:        Mood and Affect: Affect normal.     LABORATORY DATA:  I have reviewed the data as listed CBC Latest Ref Rng & Units 12/15/2021 06/16/2021 03/17/2021  WBC 4.0 - 10.5 K/uL 6.2 8.7 5.5  Hemoglobin 12.0 - 15.0 g/dL 12.6 12.1 12.8   Hematocrit 36.0 - 46.0 % 39.1 37.4 39.0  Platelets 150 - 400 K/uL 274 271 229     CMP Latest Ref Rng &  Units 12/15/2021 06/16/2021 03/17/2021  Glucose 70 - 99 mg/dL 102(H) 120(H) 88  BUN 6 - 20 mg/dL '11 15 9  ' Creatinine 0.44 - 1.00 mg/dL 0.73 0.78 0.80  Sodium 135 - 145 mmol/L 138 139 139  Potassium 3.5 - 5.1 mmol/L 3.6 3.6 3.8  Chloride 98 - 111 mmol/L 102 101 103  CO2 22 - 32 mmol/L '29 30 26  ' Calcium 8.9 - 10.3 mg/dL 9.1 9.1 9.0  Total Protein 6.5 - 8.1 g/dL 7.8 7.6 -  Total Bilirubin 0.3 - 1.2 mg/dL 0.3 0.7 -  Alkaline Phos 38 - 126 U/L 91 91 -  AST 15 - 41 U/L 20 23 -  ALT 0 - 44 U/L 19 21 -      RADIOGRAPHIC STUDIES: I have personally reviewed the radiological images as listed and agreed with the findings in the report. MR BREAST BILATERAL W WO CONTRAST INC CAD  Result Date: 11/19/2021 CLINICAL DATA:  46 year old female with history of right breast cancer post lumpectomy 02/01/2020. Recent outside chest CT dated 11/06/2021 demonstrated a small mass containing central fat in the slightly inner right breast measuring 1.6 cm. EXAM: BILATERAL BREAST MRI WITH AND WITHOUT CONTRAST TECHNIQUE: Multiplanar, multisequence MR images of both breasts were obtained prior to and following the intravenous administration of 9 ml of Gadavist Three-dimensional MR images were rendered by post-processing of the original MR data on an independent workstation. The three-dimensional MR images were interpreted, and findings are reported in the following complete MRI report for this study. Three dimensional images were evaluated at the independent interpreting workstation using the DynaCAD thin client. COMPARISON:  Previous exams. FINDINGS: Breast composition: b.  Scattered fibroglandular tissue. Background parenchymal enhancement: Mild. Right breast: Lumpectomy scar within the central to slightly inner right breast measures 1.7 cm and corresponds well with the mass seen in the right breast on recent chest CT.  Postsurgical changes are also present in the right axilla. No suspicious enhancing masses or abnormal areas of enhancement identified in the right breast to suggest malignancy. Left breast: No suspicious enhancing masses or abnormal areas of enhancement in the left breast to suggest malignancy. Lymph nodes: No abnormal appearing lymph nodes. Ancillary findings:  None. IMPRESSION: 1. Lumpectomy scar in the central to slightly inner right breast corresponds with the mass seen in the right breast on recent chest CT. 2.  No MRI evidence of malignancy in either breast. RECOMMENDATION: 1.  Recommend annual diagnostic mammography, due November 2023. 2. The American Cancer Society recommends annual MRI and mammography in patients with an estimated lifetime risk of developing breast cancer greater than 20 - 25%, or who are known or suspected to be positive for the breast cancer gene. BI-RADS CATEGORY  2: Benign. Electronically Signed   By: Everlean Alstrom M.D.   On: 11/19/2021 11:24  MM DIAG BREAST TOMO BILATERAL  Result Date: 09/22/2021 CLINICAL DATA:  History of RIGHT breast cancer status post lumpectomy on 02/01/2020. EXAM: DIGITAL DIAGNOSTIC BILATERAL MAMMOGRAM WITH TOMOSYNTHESIS AND CAD TECHNIQUE: Bilateral digital diagnostic mammography and breast tomosynthesis was performed. The images were evaluated with computer-aided detection. COMPARISON:  Previous exam(s). ACR Breast Density Category b: There are scattered areas of fibroglandular density. FINDINGS: There are stable postsurgical changes within the RIGHT breast. There are no new dominant masses, suspicious calcifications or secondary signs of malignancy within either breast. IMPRESSION: No evidence of malignancy within either breast. Stable postsurgical changes within the RIGHT breast. RECOMMENDATION: Bilateral diagnostic mammogram in 1 year. I have discussed the findings  and recommendations with the patient. If applicable, a reminder letter will be sent to  the patient regarding the next appointment. BI-RADS CATEGORY  2: Benign. Electronically Signed   By: Franki Cabot M.D.   On: 09/22/2021 14:35     ASSESSMENT & PLAN:  1. Malignant neoplasm of lower-inner quadrant of right breast of female, estrogen receptor positive ([place])   2. Aromatase inhibitor use   3. Kidney stone      Cancer Staging  Malignant neoplasm of lower-inner quadrant of right breast of female, estrogen receptor positive ([place]) Staging form: Breast, AJCC 8th Edition - Clinical stage from 07/26/2019: Stage IIB (cT2, cN1, cM0, G3, ER+, PR+, HER2-) - Unsigned   Invasive right breast cancer,  -Status post 4 cycles of dose dense Adriamycin and Cytoxan, s/p weekly Taxol x 12.  Pathology ypT1c ypN1a(sn) 04/30/2020 finished adjuvant radiation. Previously on ovarian suppresion with Zoledex previously, status post bilateral lumpectomy. Labs reviewed and discussed with patient Continue Arimidex 1 mg daily Continue vitamin D supplementation. CA 15-3, CA 27-29 are both stable. Bilateral diagnostic mammogram in November 2023.  Kidney stone, off calcium supplementation.  Bone health #Continue adjuvant Zometa today.  Proceed today. Plan adjuvant zometa for 3-5 years,  Repeat bone density.   Follow-up:  follow up lab MD Zometa in 6 months.   All questions were answered. The patient knows to call the clinic with any problems, questions or concerns. No barriers to learning was detected.     Earlie Server, MD 12/17/2021

## 2021-12-17 NOTE — Progress Notes (Signed)
Patient here for follow up. No new concerns voiced.  °

## 2021-12-17 NOTE — Patient Instructions (Signed)
[facility]  Discharge Instructions: °Thank you for choosing [facility] to provide your oncology and hematology care.  °If you have a lab appointment with the Cancer Center, please go directly to the Cancer Center and check in at the registration area. ° °Wear comfortable clothing and clothing appropriate for easy access to any Portacath or PICC line.  ° °We strive to give you quality time with your provider. You may need to reschedule your appointment if you arrive late (15 or more minutes).  Arriving late affects you and other patients whose appointments are after yours.  Also, if you miss three or more appointments without notifying the office, you may be dismissed from the clinic at the provider’s discretion.    °  °For prescription refill requests, have your pharmacy contact our office and allow 72 hours for refills to be completed.   ° °Today you received the following chemotherapy and/or immunotherapy agents ZOMETA    °  °To help prevent nausea and vomiting after your treatment, we encourage you to take your nausea medication as directed. ° °BELOW ARE SYMPTOMS THAT SHOULD BE REPORTED IMMEDIATELY: °*FEVER GREATER THAN 100.4 F (38 °C) OR HIGHER °*CHILLS OR SWEATING °*NAUSEA AND VOMITING THAT IS NOT CONTROLLED WITH YOUR NAUSEA MEDICATION °*UNUSUAL SHORTNESS OF BREATH °*UNUSUAL BRUISING OR BLEEDING °*URINARY PROBLEMS (pain or burning when urinating, or frequent urination) °*BOWEL PROBLEMS (unusual diarrhea, constipation, pain near the anus) °TENDERNESS IN MOUTH AND THROAT WITH OR WITHOUT PRESENCE OF ULCERS (sore throat, sores in mouth, or a toothache) °UNUSUAL RASH, SWELLING OR PAIN  °UNUSUAL VAGINAL DISCHARGE OR ITCHING  ° °Items with * indicate a potential emergency and should be followed up as soon as possible or go to the Emergency Department if any problems should occur. ° °Please show the CHEMOTHERAPY ALERT CARD or IMMUNOTHERAPY ALERT CARD at check-in to the  Emergency Department and triage nurse. ° °Should you have questions after your visit or need to cancel or reschedule your appointment, please contact [facility]  [phone] and follow the prompts.  Office hours are 8:00 a.m. to 4:30 p.m. Monday - Friday. Please note that voicemails left after 4:00 p.m. may not be returned until the following business day.  We are closed weekends and major holidays. You have access to a nurse at all times for urgent questions. Please call the main number to the clinic [phone] and follow the prompts. ° °For any non-urgent questions, you may also contact your provider using MyChart. We now offer e-Visits for anyone 18 and older to request care online for non-urgent symptoms. For details visit [email or web address]. °  °Also download the MyChart app! Go to the app store, search "MyChart", open the app, select [facility], and log in with your MyChart username and password. ° °Due to Covid, a mask is required upon entering the hospital/clinic. If you do not have a mask, one will be given to you upon arrival. For doctor visits, patients may have 1 support person aged 18 or older with them. For treatment visits, patients cannot have anyone with them due to current Covid guidelines and our immunocompromised population.  ° °Zoledronic Acid Injection (Hypercalcemia, Oncology) °What is this medication? °ZOLEDRONIC ACID (ZOE le dron ik AS id) slows calcium loss from bones. It high calcium levels in the blood from some kinds of cancer. It may be used in other people at risk for bone loss. °This medicine may be used for other purposes; ask   your health care provider or pharmacist if you have questions. °COMMON BRAND NAME(S): Zometa °What should I tell my care team before I take this medication? °They need to know if you have any of these conditions: °cancer °dehydration °dental disease °kidney disease °liver disease °low levels of calcium in the  blood °lung or breathing disease (asthma) °receiving steroids like dexamethasone or prednisone °an unusual or allergic reaction to zoledronic acid, other medicines, foods, dyes, or preservatives °pregnant or trying to get pregnant °breast-feeding °How should I use this medication? °This drug is injected into a vein. It is given by a health care provider in a hospital or clinic setting. °Talk to your health care provider about the use of this drug in children. Special care may be needed. °Overdosage: If you think you have taken too much of this medicine contact a poison control center or emergency room at once. °NOTE: This medicine is only for you. Do not share this medicine with others. °What if I miss a dose? °Keep appointments for follow-up doses. It is important not to miss your dose. Call your health care provider if you are unable to keep an appointment. °What may interact with this medication? °certain antibiotics given by injection °NSAIDs, medicines for pain and inflammation, like ibuprofen or naproxen °some diuretics like bumetanide, furosemide °teriparatide °thalidomide °This list may not describe all possible interactions. Give your health care provider a list of all the medicines, herbs, non-prescription drugs, or dietary supplements you use. Also tell them if you smoke, drink alcohol, or use illegal drugs. Some items may interact with your medicine. °What should I watch for while using this medication? °Visit your health care provider for regular checks on your progress. It may be some time before you see the benefit from this drug. °Some people who take this drug have severe bone, joint, or muscle pain. This drug may also increase your risk for jaw problems or a broken thigh bone. Tell your health care provider right away if you have severe pain in your jaw, bones, joints, or muscles. Tell you health care provider if you have any pain that does not go away or that gets worse. °Tell your dentist and  dental surgeon that you are taking this drug. You should not have major dental surgery while on this drug. See your dentist to have a dental exam and fix any dental problems before starting this drug. Take good care of your teeth while on this drug. Make sure you see your dentist for regular follow-up appointments. °You should make sure you get enough calcium and vitamin D while you are taking this drug. Discuss the foods you eat and the vitamins you take with your health care provider. °Check with your health care provider if you have severe diarrhea, nausea, and vomiting, or if you sweat a lot. The loss of too much body fluid may make it dangerous for you to take this drug. °You may need blood work done while you are taking this drug. °Do not become pregnant while taking this drug. Women should inform their health care provider if they wish to become pregnant or think they might be pregnant. There is potential for serious harm to an unborn child. Talk to your health care provider for more information. °What side effects may I notice from receiving this medication? °Side effects that you should report to your doctor or health care provider as soon as possible: °allergic reactions (skin rash, itching or hives; swelling of the face, lips, or   tongue) °bone pain °infection (fever, chills, cough, sore throat, pain or trouble passing urine) °jaw pain, especially after dental work °joint pain °kidney injury (trouble passing urine or change in the amount of urine) °low blood pressure (dizziness; feeling faint or lightheaded, falls; unusually weak or tired) °low calcium levels (fast heartbeat; muscle cramps or pain; pain, tingling, or numbness in the hands or feet; seizures) °low magnesium levels (fast, irregular heartbeat; muscle cramp or pain; muscle weakness; tremors; seizures) °low red blood cell counts (trouble breathing; feeling faint; lightheaded, falls; unusually weak or tired) °muscle pain °redness, blistering,  peeling, or loosening of the skin, including inside the mouth °severe diarrhea °swelling of the ankles, feet, hands °trouble breathing °Side effects that usually do not require medical attention (report to your doctor or health care provider if they continue or are bothersome): °anxious °constipation °coughing °depressed mood °eye irritation, itching, or pain °fever °general ill feeling or flu-like symptoms °nausea °pain, redness, or irritation at site where injected °trouble sleeping °This list may not describe all possible side effects. Call your doctor for medical advice about side effects. You may report side effects to FDA at 1-800-FDA-1088. °Where should I keep my medication? °This drug is given in a hospital or clinic. It will not be stored at home. °NOTE: This sheet is a summary. It may not cover all possible information. If you have questions about this medicine, talk to your doctor, pharmacist, or health care provider. °© 2022 [employer] (2021-07-08 00:00:00) ° °

## 2021-12-18 ENCOUNTER — Telehealth: Payer: Self-pay

## 2021-12-18 NOTE — Telephone Encounter (Signed)
Pt informed via mychart, Please add zometa to next visit.

## 2021-12-18 NOTE — Telephone Encounter (Signed)
Zometa has been added to 6 month follow up in August.

## 2021-12-18 NOTE — Telephone Encounter (Signed)
-----   Message from Earlie Server, MD sent at 12/17/2021  9:54 PM EST ----- Please let her know that I looked up literature, and now we recommend zometa for 3-5 years. She will still need zometa at next visit. Please add.

## 2022-01-03 ENCOUNTER — Other Ambulatory Visit: Payer: Self-pay | Admitting: Oncology

## 2022-01-29 ENCOUNTER — Other Ambulatory Visit: Payer: Managed Care, Other (non HMO)

## 2022-04-03 ENCOUNTER — Other Ambulatory Visit: Payer: Self-pay | Admitting: Oncology

## 2022-04-04 ENCOUNTER — Encounter: Payer: Self-pay | Admitting: Oncology

## 2022-04-17 ENCOUNTER — Other Ambulatory Visit: Payer: Self-pay | Admitting: Nurse Practitioner

## 2022-04-22 ENCOUNTER — Encounter: Payer: Self-pay | Admitting: Oncology

## 2022-05-26 ENCOUNTER — Ambulatory Visit
Admission: RE | Admit: 2022-05-26 | Discharge: 2022-05-26 | Disposition: A | Discharge status: Home / Self Care | Payer: Managed Care, Other (non HMO) | Source: Ambulatory Visit | Attending: Oncology | Admitting: Oncology

## 2022-05-26 DIAGNOSIS — C50311 Malignant neoplasm of lower-inner quadrant of right female breast: Secondary | ICD-10-CM | POA: Insufficient documentation

## 2022-05-26 DIAGNOSIS — Z170 Estrogen receptor positive status [ER+]: Secondary | ICD-10-CM | POA: Diagnosis present

## 2022-05-26 DIAGNOSIS — Z79811 Long term (current) use of aromatase inhibitors: Secondary | ICD-10-CM | POA: Insufficient documentation

## 2022-06-15 ENCOUNTER — Other Ambulatory Visit: Payer: Managed Care, Other (non HMO)

## 2022-06-16 ENCOUNTER — Inpatient Hospital Stay: Payer: Managed Care, Other (non HMO) | Attending: Oncology

## 2022-06-16 DIAGNOSIS — G620 Drug-induced polyneuropathy: Secondary | ICD-10-CM | POA: Diagnosis not present

## 2022-06-16 DIAGNOSIS — R600 Localized edema: Secondary | ICD-10-CM | POA: Diagnosis not present

## 2022-06-16 DIAGNOSIS — M79605 Pain in left leg: Secondary | ICD-10-CM | POA: Diagnosis not present

## 2022-06-16 DIAGNOSIS — M79604 Pain in right leg: Secondary | ICD-10-CM | POA: Diagnosis not present

## 2022-06-16 DIAGNOSIS — Z170 Estrogen receptor positive status [ER+]: Secondary | ICD-10-CM | POA: Diagnosis not present

## 2022-06-16 DIAGNOSIS — C50311 Malignant neoplasm of lower-inner quadrant of right female breast: Secondary | ICD-10-CM

## 2022-06-16 DIAGNOSIS — N200 Calculus of kidney: Secondary | ICD-10-CM | POA: Insufficient documentation

## 2022-06-16 DIAGNOSIS — Z90710 Acquired absence of both cervix and uterus: Secondary | ICD-10-CM | POA: Insufficient documentation

## 2022-06-16 DIAGNOSIS — Z79811 Long term (current) use of aromatase inhibitors: Secondary | ICD-10-CM | POA: Insufficient documentation

## 2022-06-16 LAB — COMPREHENSIVE METABOLIC PANEL
ALT: 21 U/L (ref 0–44)
AST: 23 U/L (ref 15–41)
Albumin: 4 g/dL (ref 3.5–5.0)
Alkaline Phosphatase: 93 U/L (ref 38–126)
Anion gap: 10 (ref 5–15)
BUN: 12 mg/dL (ref 6–20)
CO2: 26 mmol/L (ref 22–32)
Calcium: 9.1 mg/dL (ref 8.9–10.3)
Chloride: 103 mmol/L (ref 98–111)
Creatinine, Ser: 0.83 mg/dL (ref 0.44–1.00)
GFR, Estimated: >60 mL/min (ref >60)
Glucose, Bld: 175 mg/dL — ABNORMAL HIGH (ref 70–99)
Potassium: 3.7 mmol/L (ref 3.5–5.1)
Sodium: 139 mmol/L (ref 135–145)
Total Bilirubin: 0.8 mg/dL (ref 0.3–1.2)
Total Protein: 7.8 g/dL (ref 6.5–8.1)

## 2022-06-16 LAB — CBC WITH DIFFERENTIAL/PLATELET
Abs Immature Granulocytes: 0.01 10*3/uL (ref 0.00–0.07)
Basophils Absolute: 0 10*3/uL (ref 0.0–0.1)
Basophils Relative: 1 %
Eosinophils Absolute: 0.2 10*3/uL (ref 0.0–0.5)
Eosinophils Relative: 2 %
HCT: 40.6 % (ref 36.0–46.0)
Hemoglobin: 12.9 g/dL (ref 12.0–15.0)
Immature Granulocytes: 0 %
Lymphocytes Relative: 47 %
Lymphs Abs: 3.4 10*3/uL (ref 0.7–4.0)
MCH: 28.2 pg (ref 26.0–34.0)
MCHC: 31.8 g/dL (ref 30.0–36.0)
MCV: 88.6 fL (ref 80.0–100.0)
Monocytes Absolute: 0.4 10*3/uL (ref 0.1–1.0)
Monocytes Relative: 6 %
Neutro Abs: 3.2 10*3/uL (ref 1.7–7.7)
Neutrophils Relative %: 44 %
Platelets: 286 10*3/uL (ref 150–400)
RBC: 4.58 MIL/uL (ref 3.87–5.11)
RDW: 12.8 % (ref 11.5–15.5)
WBC: 7.2 10*3/uL (ref 4.0–10.5)
nRBC: 0 % (ref 0.0–0.2)

## 2022-06-17 ENCOUNTER — Ambulatory Visit: Payer: Managed Care, Other (non HMO) | Admitting: Oncology

## 2022-06-17 LAB — CANCER ANTIGEN 15-3: CA 15-3: 17.9 U/mL (ref 0.0–25.0)

## 2022-06-17 LAB — CANCER ANTIGEN 27.29: CA 27.29: 14.8 U/mL (ref 0.0–38.6)

## 2022-06-18 ENCOUNTER — Ambulatory Visit: Payer: Managed Care, Other (non HMO)

## 2022-06-18 ENCOUNTER — Ambulatory Visit: Payer: Managed Care, Other (non HMO) | Admitting: Oncology

## 2022-06-22 ENCOUNTER — Inpatient Hospital Stay (HOSPITAL_BASED_OUTPATIENT_CLINIC_OR_DEPARTMENT_OTHER): Payer: Managed Care, Other (non HMO) | Admitting: Oncology

## 2022-06-22 ENCOUNTER — Inpatient Hospital Stay: Payer: Managed Care, Other (non HMO)

## 2022-06-22 ENCOUNTER — Encounter: Payer: Self-pay | Admitting: Oncology

## 2022-06-22 VITALS — BP 124/87 | HR 80 | Temp 98.7°F | Resp 16 | Wt 223.7 lb

## 2022-06-22 DIAGNOSIS — G620 Drug-induced polyneuropathy: Secondary | ICD-10-CM

## 2022-06-22 DIAGNOSIS — Z808 Family history of malignant neoplasm of other organs or systems: Secondary | ICD-10-CM

## 2022-06-22 DIAGNOSIS — C50311 Malignant neoplasm of lower-inner quadrant of right female breast: Secondary | ICD-10-CM | POA: Diagnosis not present

## 2022-06-22 DIAGNOSIS — Z170 Estrogen receptor positive status [ER+]: Secondary | ICD-10-CM | POA: Diagnosis not present

## 2022-06-22 DIAGNOSIS — G629 Polyneuropathy, unspecified: Secondary | ICD-10-CM | POA: Diagnosis not present

## 2022-06-22 DIAGNOSIS — Z801 Family history of malignant neoplasm of trachea, bronchus and lung: Secondary | ICD-10-CM

## 2022-06-22 DIAGNOSIS — M7989 Other specified soft tissue disorders: Secondary | ICD-10-CM

## 2022-06-22 MED ORDER — SODIUM CHLORIDE 0.9 % IV SOLN
INTRAVENOUS | Status: DC
  Filled 2022-06-22: qty 250

## 2022-06-22 MED ORDER — ZOLEDRONIC ACID 4 MG/100ML IV SOLN
4.0000 mg | Freq: Once | INTRAVENOUS | Status: AC
  Administered 2022-06-22: 4 mg via INTRAVENOUS
  Filled 2022-06-22: qty 100

## 2022-06-22 NOTE — Progress Notes (Signed)
Pt in for follow up, reports swelling in left foot and lower leg x 2 months.  Pt reports numbness and tingling in feet bilaterally.  Pt states she recently lost her 4th toenail on left foot as well.

## 2022-06-22 NOTE — Progress Notes (Signed)
Hematology/Oncology Progress note [phone] [phone]     Patient Care Team: Sharyne Peach, MD as PCP - General (Family Medicine) Earlie Server, MD as Consulting Physician (Oncology) Benjamine Sprague, DO as Consulting Physician (Surgery) Rico Junker, RN as Registered Nurse Noreene Filbert, MD as Radiation Oncologist (Radiation Oncology)  Date of Service:  06/22/2022  CHIEF COMPLAINT: F/u of right breast cancer   SUMMARY OF ONCOLOGIC HISTORY: Oncology History Overview Note  Cancer Staging Malignant neoplasm of lower-inner quadrant of right breast of female, estrogen receptor positive ([place]) Staging form: Breast, AJCC 8th Edition - Clinical stage from 07/26/2019: Stage IIB (cT2, cN1, cM0, G3, ER+, PR+, HER2-) - Unsigned    Malignant neoplasm of lower-inner quadrant of right breast of female, estrogen receptor positive ([place])  07/20/2019 Mammogram   Mammogram 07/20/19  IMPRESSION: Suspicious palpable right breast mass 3.4 x 2.5 x 3.4 cm 5 o'clock position 3 cm from nipple.   Suspicious 1.6cm cortically thickened right axillary lymph node.   07/26/2019 Initial Diagnosis   DIAGNOSIS: 07/26/19 A. RIGHT BREAST, 5:00, 3CMFN; ULTRASOUND-GUIDED NEEDLE CORE BIOPSY:  - INVASIVE MAMMARY CARCINOMA, NO SPECIAL TYPE.    07/26/2019 Receptors her2   BREAST BIOMARKER TESTS  Estrogen Receptor (ER) Status: POSITIVE                       Percentage of cells with nuclear positivity: 51-90%                       Average intensity of staining: Strong   Progesterone Receptor (PgR) Status: POSITIVE                       Percentage of cells with nuclear positivity:  Greater than 90%                       Average intensity of staining: Strong   HER2 (by immunohistochemistry): NEGATIVE (Score 1+)    08/05/2019 Initial Diagnosis   Invasive carcinoma of breast ([place])   08/17/2019 Surgery   INSERTION PORT-A-CATH by Dr. Lysle Pearl 08/17/19  Mediport was removed in June 2022    08/18/2019 - 01/01/2020 Chemotherapy   AC q2weeks for 4 cycles starting 08/18/19 followed by weekly Taxol for 12 weeks    11/13/2019 Genetic Testing   Negative genetic testing. No pathogenic variants identified on the [facility] Common Hereditary Cancers Panel. The report date is 11/13/2019.   The Common Hereditary Cancers Panel offered by [facility] includes sequencing and/or deletion duplication testing of the following 48 genes: APC, ATM, AXIN2, BARD1, BMPR1A, BRCA1, BRCA2, BRIP1, CDH1, CDKN2A (p14ARF), CDKN2A (p16INK4a), CKD4, CHEK2, CTNNA1, DICER1, EPCAM (Deletion/duplication testing only), GREM1 (promoter region deletion/duplication testing only), KIT, MEN1, MLH1, MSH2, MSH3, MSH6, MUTYH, NBN, NF1, NHTL1, PALB2, PDGFRA, PMS2, POLD1, POLE, PTEN, RAD50, RAD51C, RAD51D, RNF43, SDHB, SDHC, SDHD, SMAD4, SMARCA4. STK11, TP53, TSC1, TSC2, and VHL.  The following genes were evaluated for sequence changes only: SDHA and HOXB13 c.251G>A variant only.   02/01/2020 Surgery   s/p right lumpectomy and axillary lymph node dissection. ypT1c ypN1a(sn)  ER/PR positive, HER2 negative, residual invasive mammary carcinoma, grade 3. 1/3 sentinel lymph node positive and 2/10 right axillary lymph nodes positive. DCIS present. Margin was negative for invasive carcinoma and DCIS    04/30/2020 -  Radiation Therapy   Finished adjuvant radiation    05/2020 -  Anti-estrogen oral therapy   started ovarian suppression with Zoladex monthly, started  Arimidex 1 mg daily.   03/21/2021 Surgery   Surgical ovarian suppression via TAH-BSO   09/22/2021 Imaging   Bilateral diagnostic mammogram showed No evidence of malignancy within either breast. Stable postsurgical changes within the RIGHT breast.    11/19/2021 Imaging    bilateral MRI breast with and without contrast showed lumpectomy scar in the central to slightly inner right breast corresponds to the mass seen in the right breast on recent CT.  No MRI evidence of malignancy in  either breast.   05/26/2022 Imaging   DEXA showed normal bone density     #Neuropathy due to chemotherapy, pre-existing sciatica, symptoms are resolved, and she is not on any neuropathy medication.  History of right kidney stone, there was concern of calcium calculus.  She is off calcium    INTERVAL HISTORY:  Jessica [name] presents to follow-up for breast cancer. She takes Arimidex 1 mg daily she tolerates with manageable side effects. She has noticed bilateral lower extremity edema, for about 3 months.  Left worse than right.  No calf tenderness or erythema She has bilateral lower extremity pain after walking. + Numbness tingling of lower extremity.  Review of Systems  Constitutional:  Positive for fatigue. Negative for appetite change, chills and fever.  HENT:   Negative for hearing loss and voice change.   Eyes:  Negative for eye problems.  Respiratory:  Negative for chest tightness and cough.   Cardiovascular:  Positive for leg swelling. Negative for chest pain.  Gastrointestinal:  Negative for abdominal distention, abdominal pain and blood in stool.  Endocrine: Positive for hot flashes.  Genitourinary:  Negative for difficulty urinating and frequency.   Musculoskeletal:  Negative for arthralgias.  Skin:  Negative for itching and rash.  Neurological:  Positive for numbness. Negative for extremity weakness.  Hematological:  Negative for adenopathy.  Psychiatric/Behavioral:  Negative for confusion and sleep disturbance.     MEDICAL HISTORY:  Past Medical History:  Diagnosis Date   Anemia    Arthritis    left ankle   Breast cancer ([place]) 07/23/2019   right breast- [facility]   Cancer ([place])    breast cancer   Family history of cervical cancer    Family history of lung cancer    Frequency    GERD (gastroesophageal reflux disease)    History of kidney stones    h/o   Obesity    Personal history of chemotherapy    Personal history of radiation therapy    Right flank pain      SURGICAL HISTORY: Past Surgical History:  Procedure Laterality Date   AXILLARY LYMPH NODE DISSECTION Right 02/01/2020   Procedure: AXILLARY LYMPH NODE DISSECTION;  Surgeon: Benjamine Sprague, DO;  Location: [facility] ORS;  Service: General;  Laterality: Right;   BREAST BIOPSY Right 2020   [facility]   BREAST LUMPECTOMY Right 02/01/2020   pt has neo adj chemo and post radation with LN removed   CESAREAN SECTION     x 2   CHOLECYSTECTOMY     DILATION AND CURETTAGE OF UTERUS     LITHOTRIPSY  AROUND 2010   PORTACATH PLACEMENT Left 08/17/2019   Procedure: INSERTION PORT-A-CATH;  Surgeon: Benjamine Sprague, DO;  Location: [facility] ORS;  Service: General;  Laterality: Left;   ROBOTIC ASSISTED TOTAL HYSTERECTOMY WITH BILATERAL SALPINGO OOPHERECTOMY Bilateral 03/21/2021   Procedure: XI ROBOTIC ASSISTED TOTAL HYSTERECTOMY WITH BILATERAL SALPINGO OOPHORECTOMY;  Surgeon: Benjaman Kindler, MD;  Location: [facility] ORS;  Service: Gynecology;  Laterality: Bilateral;   TUBAL  LIGATION      I have reviewed the social history and family history with the patient and they are unchanged from previous note.  ALLERGIES:  has No Known Allergies.  MEDICATIONS:  Current Outpatient Medications  Medication Sig Dispense Refill   anastrozole (ARIMIDEX) 1 MG tablet TAKE 1 TABLET BY MOUTH EVERY DAY 90 tablet 1   Aspirin-Acetaminophen-Caffeine (GOODYS EXTRA STRENGTH) 500-325-65 MG PACK Take 1 packet by mouth daily as needed (pain).     fluticasone (FLONASE) 50 MCG/ACT nasal spray SPRAY 1 SPRAY INTO BOTH NOSTRILS DAILY. (Patient not taking: Reported on 06/22/2022) 16 mL 2   pantoprazole (PROTONIX) 40 MG tablet Take 1 tablet (40 mg total) by mouth daily. (Patient not taking: Reported on 06/22/2022) 90 tablet 1   No current facility-administered medications for this visit.   Facility-Administered Medications Ordered in Other Visits  Medication Dose Route Frequency Provider Last Rate Last Admin   sodium chloride flush (NS) 0.9 % injection 10 mL   10 mL Intravenous Once Earlie Server, MD        PHYSICAL EXAMINATION: ECOG PERFORMANCE STATUS: 1 - Symptomatic but completely ambulatory  Vitals:   06/22/22 1321  BP: 124/87  Pulse: 80  Resp: 16  Temp: 98.7 F (37.1 C)  SpO2: 98%   Filed Weights   06/22/22 1321  Weight: 223 lb 11.2 oz (101.5 kg)   Physical Exam Constitutional:      General: She is not in acute distress.    Appearance: She is not diaphoretic.  HENT:     Head: Normocephalic and atraumatic.     Nose: Nose normal.     Mouth/Throat:     Pharynx: No oropharyngeal exudate.  Eyes:     General: No scleral icterus.    Pupils: Pupils are equal, round, and reactive to light.  Cardiovascular:     Rate and Rhythm: Normal rate and regular rhythm.     Heart sounds: No murmur heard. Pulmonary:     Effort: Pulmonary effort is normal. No respiratory distress.     Breath sounds: No rales.  Chest:     Chest wall: No tenderness.  Abdominal:     General: There is no distension.     Palpations: Abdomen is soft.     Tenderness: There is no abdominal tenderness.  Musculoskeletal:        General: Swelling present. Normal range of motion.     Cervical back: Normal range of motion and neck supple.     Right lower leg: Edema present.     Left lower leg: Edema present.  Skin:    General: Skin is warm and dry.     Findings: No erythema.  Neurological:     Mental Status: She is alert and oriented to person, place, and time.     Cranial Nerves: No cranial nerve deficit.     Motor: No abnormal muscle tone.     Coordination: Coordination normal.  Psychiatric:        Mood and Affect: Affect normal.      LABORATORY DATA:  I have reviewed the data as listed    Latest Ref Rng & Units 06/16/2022    1:46 PM 12/15/2021    9:33 AM 06/16/2021    1:48 PM  CBC  WBC 4.0 - 10.5 K/uL 7.2  6.2  8.7   Hemoglobin 12.0 - 15.0 g/dL 12.9  12.6  12.1   Hematocrit 36.0 - 46.0 % 40.6  39.1  37.4   Platelets 150 -  400 K/uL 286  274  271          Latest Ref Rng & Units 06/16/2022    1:46 PM 12/15/2021    9:33 AM 06/16/2021    1:48 PM  CMP  Glucose 70 - 99 mg/dL 175  102  120   BUN 6 - 20 mg/dL '12  11  15   ' Creatinine 0.44 - 1.00 mg/dL 0.83  0.73  0.78   Sodium 135 - 145 mmol/L 139  138  139   Potassium 3.5 - 5.1 mmol/L 3.7  3.6  3.6   Chloride 98 - 111 mmol/L 103  102  101   CO2 22 - 32 mmol/L '26  29  30   ' Calcium 8.9 - 10.3 mg/dL 9.1  9.1  9.1   Total Protein 6.5 - 8.1 g/dL 7.8  7.8  7.6   Total Bilirubin 0.3 - 1.2 mg/dL 0.8  0.3  0.7   Alkaline Phos 38 - 126 U/L 93  91  91   AST 15 - 41 U/L '23  20  23   ' ALT 0 - 44 U/L '21  19  21       ' RADIOGRAPHIC STUDIES: I have personally reviewed the radiological images as listed and agreed with the findings in the report. DG Bone Density  Result Date: 05/26/2022 EXAM: DUAL X-RAY ABSORPTIOMETRY (DXA) FOR BONE MINERAL DENSITY IMPRESSION: Your patient Jessica [name] completed a BMD test on 05/26/2022 using the [place] (software version: 14.10) manufactured by [employer]. The following summarizes the results of our evaluation. Technologist:vlm PATIENT BIOGRAPHICAL: Name: Jessica [name], Jessica [name] Patient ID: [id number] Birth Date: 1976-01-31 Height: 62.0 in. Gender: Female Exam Date: 05/26/2022 Weight: 219.9 lbs. Indications: High Risk Meds, History of Breast Cancer, Hysterectomy, Postmenopausal, Previous Chemo and Radiation Fractures: Treatments: Anastrozole, Omeprazole DENSITOMETRY RESULTS: Site      Region     Measured Date Measured Age WHO Classification Young Adult T-score BMD         %Change vs. Previous Significant Change (*) AP Spine L1-L4 (L2) 05/26/2022 48.0 Normal 1.7 1.392 g/cm2 4.0% Yes AP Spine L1-L4 (L2) 05/22/2020 46.0 Normal 1.2 1.338 g/cm2 - - DualFemur Neck Right 05/26/2022 48.0 Normal -0.6 0.957 g/cm2 1.2% - DualFemur Neck Right 05/22/2020 46.0 Normal -0.7 0.946 g/cm2 - - DualFemur Total Mean 05/26/2022 48.0 Normal -0.2 0.977 g/cm2 4.8% Yes  DualFemur Total Mean 05/22/2020 46.0 Normal -0.6 0.932 g/cm2 - - ASSESSMENT: The BMD measured at Femur Neck Right is 0.957 g/cm2 with a T-score of -0.6. This patient is considered normal according to [facility]) criteria. L- 2 was excluded due to degenerative changes. Compared with prior study, there has been significant increase in the spine. Compared with prior study, there has been significant increase in the total hip. The scan quality is good. World Pharmacologist [facility]) criteria for post-menopausal, Caucasian Women: Normal:                   T-score at or above -1 SD Osteopenia/low bone mass: T-score between -1 and -2.5 SD Osteoporosis:             T-score at or below -2.5 SD RECOMMENDATIONS: 1. All patients should optimize calcium and vitamin D intake. 2. Consider FDA-approved medical therapies in postmenopausal women and men aged 52 years and older, based on the following: a. A hip or vertebral(clinical or morphometric) fracture b. T-score < -2.5 at the femoral neck or spine after appropriate  evaluation to exclude secondary causes c. Low bone mass (T-score between -1.0 and -2.5 at the femoral neck or spine) and a 10-year probability of a hip fracture > 3% or a 10-year probability of a major osteoporosis-related fracture > 20% based on the [place]-adapted WHO algorithm 3. Clinician judgment and/or patient preferences may indicate treatment for people with 10-year fracture probabilities above or below these levels FOLLOW-UP: People with diagnosed cases of osteoporosis or at high risk for fracture should have regular bone mineral density tests. For patients eligible for Medicare, routine testing is allowed once every 2 years. The testing frequency can be increased to one year for patients who have rapidly progressing disease, those who are receiving or discontinuing medical therapy to restore bone mass, or have additional risk factors. I have reviewed this report, and agree with the above  findings. [facility] Electronically Signed   By: Elmer Picker M.D.   On: 05/26/2022 14:11      ASSESSMENT & PLAN:  1. Left leg swelling   2. Right leg swelling   3. Malignant neoplasm of lower-inner quadrant of right breast of female, estrogen receptor positive ([place])   4. Neuropathy due to chemotherapeutic drug [facility])      Cancer Staging  Malignant neoplasm of lower-inner quadrant of right breast of female, estrogen receptor positive ([place]) Staging form: Breast, AJCC 8th Edition - Clinical stage from 07/26/2019: Stage IIB (cT2, cN1, cM0, G3, ER+, PR+, HER2-) - Unsigned   Invasive right breast cancer,  -Status post 4 cycles of dose dense Adriamycin and Cytoxan, s/p weekly Taxol x 12.  Pathology ypT1c ypN1a(sn) 04/30/2020 finished adjuvant radiation. Previously on ovarian suppresion with Zoledex previously, status post bilateraltotal hysterectomy with bilateral salpingo oophorectomy.  Patient is now on Arimidex  1 mg daily. Labs reviewed and discussed with patient. Continue Arimidex 1 mg daily Continue vitamin D supplementation. CA 15-3, CA 27-29 are both stable. Bilateral diagnostic mammogram in November 2023.  Kidney stone, off calcium supplementation.  Bone health #Continue adjuvant Zometa today.  Proceed today. Plan adjuvant zometa for 3-5 years,  05/26/2022 bone density-normal.  Discussed with patient.  #Bilateral lower extremity swelling, check ultrasound bilateral lower extremity rule out DVT. Also check echocardiogram.  #Neuropathy of lower extremity, slightly worse.  Patient has been off chemotherapy for 2 years.   We will check other etiologies  check HbA1c  Follow-up:  follow up lab MD Zometa in 6 months.   All questions were answered. The patient knows to call the clinic with any problems, questions or concerns. No barriers to learning was detected.     Earlie Server, MD 06/22/2022

## 2022-06-23 LAB — HEMOGLOBIN A1C
Hgb A1c MFr Bld: 5.3 % (ref 4.8–5.6)
Mean Plasma Glucose: 105.41 mg/dL

## 2022-06-26 ENCOUNTER — Ambulatory Visit
Admission: RE | Admit: 2022-06-26 | Discharge: 2022-06-26 | Disposition: A | Discharge status: Home / Self Care | Payer: Managed Care, Other (non HMO) | Source: Ambulatory Visit | Attending: Oncology | Admitting: Oncology

## 2022-06-26 DIAGNOSIS — M7989 Other specified soft tissue disorders: Secondary | ICD-10-CM | POA: Diagnosis present

## 2022-06-26 DIAGNOSIS — R600 Localized edema: Secondary | ICD-10-CM | POA: Diagnosis not present

## 2022-06-26 DIAGNOSIS — I081 Rheumatic disorders of both mitral and tricuspid valves: Secondary | ICD-10-CM

## 2022-06-26 DIAGNOSIS — I340 Nonrheumatic mitral (valve) insufficiency: Secondary | ICD-10-CM | POA: Diagnosis not present

## 2022-06-26 DIAGNOSIS — Z853 Personal history of malignant neoplasm of breast: Secondary | ICD-10-CM | POA: Insufficient documentation

## 2022-06-26 LAB — ECHOCARDIOGRAM COMPLETE
AR max vel: 2.22 cm2
AV Area VTI: 2.16 cm2
AV Area mean vel: 2.03 cm2
AV Mean grad: 4 mmHg
AV Peak grad: 6.8 mmHg
Ao pk vel: 1.3 m/s
Area-P 1/2: 4.21 cm2
S' Lateral: 3.17 cm

## 2022-06-26 NOTE — Progress Notes (Signed)
*  PRELIMINARY RESULTS* Echocardiogram 2D Echocardiogram has been performed.  Jessica [name] Meter 06/26/2022, 10:41 AM

## 2022-06-30 ENCOUNTER — Ambulatory Visit
Admission: RE | Admit: 2022-06-30 | Discharge: 2022-06-30 | Disposition: A | Discharge status: Home / Self Care | Payer: Managed Care, Other (non HMO) | Source: Ambulatory Visit | Attending: Oncology | Admitting: Oncology

## 2022-06-30 ENCOUNTER — Encounter: Payer: Self-pay | Admitting: Oncology

## 2022-06-30 DIAGNOSIS — M7989 Other specified soft tissue disorders: Secondary | ICD-10-CM | POA: Diagnosis present

## 2022-07-01 NOTE — Telephone Encounter (Signed)
Please advise 

## 2022-07-03 ENCOUNTER — Other Ambulatory Visit: Payer: Self-pay

## 2022-07-03 DIAGNOSIS — I5189 Other ill-defined heart diseases: Secondary | ICD-10-CM

## 2022-07-03 DIAGNOSIS — M7989 Other specified soft tissue disorders: Secondary | ICD-10-CM

## 2022-07-09 ENCOUNTER — Other Ambulatory Visit: Payer: Self-pay | Admitting: Oncology

## 2022-07-09 ENCOUNTER — Encounter: Payer: Self-pay | Admitting: Oncology

## 2022-07-27 ENCOUNTER — Encounter: Payer: Self-pay | Admitting: Internal Medicine

## 2022-07-27 ENCOUNTER — Ambulatory Visit: Payer: Managed Care, Other (non HMO) | Attending: Internal Medicine | Admitting: Internal Medicine

## 2022-07-27 VITALS — BP 118/72 | HR 74 | Ht 62.0 in | Wt 219.6 lb

## 2022-07-27 DIAGNOSIS — R600 Localized edema: Secondary | ICD-10-CM

## 2022-07-27 NOTE — Patient Instructions (Signed)
Medication Instructions:  Your physician recommends that you continue on your current medications as directed. Please refer to the Current Medication list given to you today.  *If you need a refill on your cardiac medications before your next appointment, please call your pharmacy*   Follow-Up: At  [facility], you and your health needs are our priority.  As part of our continuing mission to provide you with exceptional heart care, we have created designated Provider Care Teams.  These Care Teams include your primary Cardiologist (physician) and Advanced Practice Providers (APPs -  Physician Assistants and Nurse Practitioners) who all work together to provide you with the care you need, when you need it.  We recommend signing up for the patient portal called "MyChart".  Sign up information is provided on this After Visit Summary.  MyChart is used to connect with patients for Virtual Visits (Telemedicine).  Patients are able to view lab/test results, encounter notes, upcoming appointments, etc.  Non-urgent messages can be sent to your provider as well.   To learn more about what you can do with MyChart, go to [email or web address].    Your next appointment:   We will see you on an as needed basis.  Provider:   Mary Branch, MD 

## 2022-07-27 NOTE — Progress Notes (Signed)
Cardiology Office Note:    Date:  07/27/2022   ID:  Jessica [name], DOB 11-01-1976, MRN [id number]  PCP:  Sharyne Peach, MD   [facility] Providers Cardiologist:  None     Referring MD: Earlie Server, MD   No chief complaint on file. LE edema  History of Present Illness:    Jessica [name] is a 46 y.o. female with a hx of obesity,  R breast cancer,  ER+/PR+/Her2- referral for BLE edema  She saw her Oncologist Dr. Tasia Catchings and noted to have BL LE ext swelling. Recent echo 8/25 shows normal LV fxn, E/e'12, normal GLS, MR is trivial, no significant PHTN.  She is a former smoker.  She was seen by her PCP at [facility] 8/30 and prescribed lasix for edema. She was started on ozempic. No hx of diabetes.   She's swelling L leg for 3 months.  The lasix has helped. It is prescribed as needed for leg swelling.  She walks a lot.  She is deconditioned. No Cp.   PND negative, orthopnea negative. No syncope.  TC 222, LDL 128, HDL 49 Lipids, A1c nl  Family Hx: no family hx of CAD. No heart failure  Bp in good control  Chemo: 08/18/2019-01/01/2020 AC q2weeks for 4 cycles starting 08/18/19 dose dense adriamycin and cytoxan 240  mg/m2, followed by weekly Taxol for 12 weeks  S/p R lumpectomy 02/01/2020  XRT R breast 04/30/2020 05/2020 zoladex, Arimidex  Past Medical History:  Diagnosis Date   Anemia    Arthritis    left ankle   Breast cancer ([place]) 07/23/2019   right breast- [facility]   Cancer ([place])    breast cancer   Family history of cervical cancer    Family history of lung cancer    Frequency    GERD (gastroesophageal reflux disease)    History of kidney stones    h/o   Obesity    Personal history of chemotherapy    Personal history of radiation therapy    Right flank pain     Past Surgical History:  Procedure Laterality Date   AXILLARY LYMPH NODE DISSECTION Right 02/01/2020   Procedure: AXILLARY LYMPH NODE DISSECTION;  Surgeon: Benjamine Sprague, DO;  Location: [facility] ORS;  Service:  General;  Laterality: Right;   BREAST BIOPSY Right 2020   [facility]   BREAST LUMPECTOMY Right 02/01/2020   pt has neo adj chemo and post radation with LN removed   CESAREAN SECTION     x 2   CHOLECYSTECTOMY     DILATION AND CURETTAGE OF UTERUS     LITHOTRIPSY  AROUND 2010   PORTACATH PLACEMENT Left 08/17/2019   Procedure: INSERTION PORT-A-CATH;  Surgeon: Benjamine Sprague, DO;  Location: [facility] ORS;  Service: General;  Laterality: Left;   ROBOTIC ASSISTED TOTAL HYSTERECTOMY WITH BILATERAL SALPINGO OOPHERECTOMY Bilateral 03/21/2021   Procedure: XI ROBOTIC ASSISTED TOTAL HYSTERECTOMY WITH BILATERAL SALPINGO OOPHORECTOMY;  Surgeon: Benjaman Kindler, MD;  Location: [facility] ORS;  Service: Gynecology;  Laterality: Bilateral;   TUBAL LIGATION      Current Medications: No outpatient medications have been marked as taking for the 07/27/22 encounter (Appointment) with Janina Mayo, MD.     Allergies:   Patient has no known allergies.   Social History   Socioeconomic History   Marital status: Single    Spouse name: Not on file   Number of children: Not on file   Years of education: Not on file   Highest education level: Not  on file  Occupational History   Not on file  Tobacco Use   Smoking status: Never   Smokeless tobacco: Never  Vaping Use   Vaping Use: Never used  Substance and Sexual Activity   Alcohol use: No   Drug use: No   Sexual activity: Yes  Other Topics Concern   Not on file  Social History Narrative   Not on file   Social Determinants of Health   Financial Resource Strain: Not on file  Food Insecurity: Not on file  Transportation Needs: Not on file  Physical Activity: Not on file  Stress: Not on file  Social Connections: Not on file     Family History: The patient's family history includes Cervical cancer (age of onset: 29) in her mother; Kidney Stones in her mother and another family member; Lung cancer in her maternal grandmother; Other in her sister. There is no history  of Breast cancer.  ROS:   Please see the history of present illness.     All other systems reviewed and are negative.  EKGs/Labs/Other Studies Reviewed:    The following studies were reviewed today:   EKG:  EKG is  ordered today.  The ekg ordered today demonstrates   07/27/2022- NSR, poor R wave progression; unchanged 2021, likely habitus  Recent Labs: 06/16/2022: ALT 21; BUN 12; Creatinine, Ser 0.83; Hemoglobin 12.9; Platelets 286; Potassium 3.7; Sodium 139  Recent Lipid Panel No results found for: "CHOL", "TRIG", "HDL", "CHOLHDL", "VLDL", "LDLCALC", "LDLDIRECT"   Risk Assessment/Calculations:         Physical Exam:    VS:  There were no vitals taken for this visit.    Wt Readings from Last 3 Encounters:  06/22/22 223 lb 11.2 oz (101.5 kg)  12/17/21 219 lb 14.4 oz (99.7 kg)  12/15/21 216 lb 12.8 oz (98.3 kg)     GEN:  Well nourished, well developed in no acute distress HEENT: Normal NECK: No JVD; No carotid bruits LYMPHATICS: No lymphadenopathy CARDIAC: RRR, no murmurs, rubs, gallops RESPIRATORY:  Clear to auscultation without rales, wheezing or rhonchi  ABDOMEN: Soft, non-tender, non-distended MUSCULOSKELETAL:  No edema; No deformity  SKIN: Warm and dry NEUROLOGIC:  Alert and oriented x 3 PSYCHIATRIC:  Normal affect   ASSESSMENT:    Unilateral LE edema: echo did not show significant high filling pressures. She has no signs of CHF symptoms. I am not concerned for HFpeF. Unclear why she is having unilateral swelling, legs appearing symmetric to me. The primary goal for her is weight loss  CVD Risk Mitigation: Obesity: agree with GLP-1 agonist; Mancel Parsons can be used  Elevated lipids, otherwise lower risk  --Recommend to continue with lifestyle modification and CVD risk mitigation (yearly A1c, lipid monitoring); We discussed cutting food proportions and walking each day   CardioOnc: Hx of 240 mg/m2 adriamycin. She has normal LV fxn and strain. No hx of cancer therapy  cardiac dysfunction (CTRCD). Her radiation was to her R breast, lower risk of radiation induced heart dx  PLAN:    In order of problems listed above:  No indication for further cardiac w/u           Medication Adjustments/Labs and Tests Ordered: Current medicines are reviewed at length with the patient today.  Concerns regarding medicines are outlined above.  No orders of the defined types were placed in this encounter.  No orders of the defined types were placed in this encounter.   There are no Patient Instructions on file for this  visit.   Signed, Janina Mayo, MD  07/27/2022 7:26 AM    [place]

## 2022-09-23 ENCOUNTER — Other Ambulatory Visit: Payer: Managed Care, Other (non HMO)

## 2022-09-23 ENCOUNTER — Inpatient Hospital Stay: Admission: RE | Admit: 2022-09-23 | Payer: Managed Care, Other (non HMO) | Source: Ambulatory Visit

## 2022-10-02 ENCOUNTER — Encounter: Payer: Self-pay | Admitting: Oncology

## 2022-10-21 ENCOUNTER — Ambulatory Visit
Admission: RE | Admit: 2022-10-21 | Discharge: 2022-10-21 | Disposition: A | Discharge status: Home / Self Care | Payer: Managed Care, Other (non HMO) | Source: Ambulatory Visit | Attending: Oncology | Admitting: Oncology

## 2022-10-21 ENCOUNTER — Ambulatory Visit
Admission: RE | Admit: 2022-10-21 | Discharge: 2022-10-21 | Disposition: A | Discharge status: Home / Self Care | Payer: Medicaid Other | Source: Ambulatory Visit | Attending: Oncology | Admitting: Oncology

## 2022-10-21 DIAGNOSIS — C50311 Malignant neoplasm of lower-inner quadrant of right female breast: Secondary | ICD-10-CM | POA: Insufficient documentation

## 2022-10-21 DIAGNOSIS — Z170 Estrogen receptor positive status [ER+]: Secondary | ICD-10-CM

## 2022-10-28 ENCOUNTER — Encounter: Payer: Self-pay | Admitting: Oncology

## 2022-11-03 ENCOUNTER — Encounter: Payer: Self-pay | Admitting: Oncology

## 2022-11-07 NOTE — Patient Instructions (Signed)

## 2022-11-10 ENCOUNTER — Encounter: Payer: Self-pay | Admitting: Nurse Practitioner

## 2022-11-10 ENCOUNTER — Ambulatory Visit (INDEPENDENT_AMBULATORY_CARE_PROVIDER_SITE_OTHER): Payer: Self-pay | Admitting: Nurse Practitioner

## 2022-11-10 VITALS — BP 121/80 | HR 80 | Temp 98.1°F | Ht 61.97 in | Wt 210.8 lb

## 2022-11-10 DIAGNOSIS — Z0289 Encounter for other administrative examinations: Secondary | ICD-10-CM

## 2022-11-10 NOTE — Progress Notes (Signed)
BP 121/80   Pulse 80   Temp 98.1 F (36.7 C) (Oral)   Ht 5' 1.97" (1.574 m)   Wt 210 lb 12.8 oz (95.6 kg)   SpO2 99%   BMI 38.59 kg/m    Subjective:    Patient ID: Jessica [name], female    DOB: 13-Dec-1975, 47 y.o.   MRN: [id number]  HPI: Jessica [name] is a 47 y.o. female presenting on 11/10/2022 for DOT Physical.  Previous DOT physical was 2021.  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  Wishes to maintain DOT coverage, not currently using.    Past Medical History:  Past Medical History:  Diagnosis Date   Anemia    Arthritis    left ankle   Breast cancer ([place]) 07/23/2019   [facility] breast- [facility]   Cancer ([place])    breast cancer   Family history of cervical cancer    Family history of lung cancer    Frequency    GERD (gastroesophageal reflux disease)    History of kidney stones    h/o   Obesity    Personal history of chemotherapy    Personal history of radiation therapy    Right flank pain     Surgical History:  Past Surgical History:  Procedure Laterality Date   AXILLARY LYMPH NODE DISSECTION Right 02/01/2020   Procedure: AXILLARY LYMPH NODE DISSECTION;  Surgeon: Benjamine Sprague, DO;  Location: [facility] ORS;  Service: General;  Laterality: Right;   BREAST BIOPSY Right 2020   [facility]   BREAST LUMPECTOMY Right 02/01/2020   pt has neo adj chemo and post radation with LN removed   CESAREAN SECTION     x 2   CHOLECYSTECTOMY     DILATION AND CURETTAGE OF UTERUS     LITHOTRIPSY  AROUND 2010   PORTACATH PLACEMENT Left 08/17/2019   Procedure: INSERTION PORT-A-CATH;  Surgeon: Benjamine Sprague, DO;  Location: [facility] ORS;  Service: General;  Laterality: Left;   ROBOTIC ASSISTED TOTAL HYSTERECTOMY WITH BILATERAL SALPINGO OOPHERECTOMY Bilateral 03/21/2021   Procedure: XI ROBOTIC ASSISTED TOTAL HYSTERECTOMY WITH BILATERAL SALPINGO OOPHORECTOMY;  Surgeon: Benjaman Kindler, MD;  Location: [facility] ORS;  Service: Gynecology;  Laterality: Bilateral;   TUBAL LIGATION       Medications:  Current Outpatient Medications on File Prior to Visit  Medication Sig   anastrozole (ARIMIDEX) 1 MG tablet TAKE 1 TABLET BY MOUTH EVERY DAY   Aspirin-Acetaminophen-Caffeine (GOODYS EXTRA STRENGTH) 500-325-65 MG PACK Take 1 packet by mouth daily as needed (pain).   fluticasone (FLONASE) 50 MCG/ACT nasal spray SPRAY 1 SPRAY INTO BOTH NOSTRILS DAILY.   buPROPion (WELLBUTRIN XL) 150 MG 24 hr tablet Take 1 tablet by mouth daily. (Patient not taking: Reported on 11/10/2022)   furosemide (LASIX) 20 MG tablet Take 20 mg by mouth daily. (Patient not taking: Reported on 11/10/2022)   pantoprazole (PROTONIX) 40 MG tablet Take 1 tablet (40 mg total) by mouth daily. (Patient not taking: Reported on 11/10/2022)   No current facility-administered medications on file prior to visit.    Allergies:  No Known Allergies  Social History:  Social History   Socioeconomic History   Marital status: Single    Spouse name: Not on file   Number of children: Not on file   Years of education: Not on file   Highest education level: Not on file  Occupational History   Not on file  Tobacco Use   Smoking status: Never   Smokeless tobacco:  Never  Vaping Use   Vaping Use: Never used  Substance and Sexual Activity   Alcohol use: No   Drug use: No   Sexual activity: Yes  Other Topics Concern   Not on file  Social History Narrative   Not on file   Social Determinants of Health   Financial Resource Strain: Not on file  Food Insecurity: Not on file  Transportation Needs: Not on file  Physical Activity: Not on file  Stress: Not on file  Social Connections: Not on file  Intimate Partner Violence: Not on file   Social History   Tobacco Use  Smoking Status Never  Smokeless Tobacco Never   Social History   Substance and Sexual Activity  Alcohol Use No    Family History:  Family History  Problem Relation Age of Onset   Kidney Stones Other    Cervical cancer Mother 78   Kidney  Stones Mother    Other Sister    Lung cancer Maternal Grandmother    Breast cancer Neg Hx     Past medical history, surgical history, medications, allergies, family history and social history reviewed with patient today and changes made to appropriate areas of the chart.   ROS All other ROS negative except what is listed above and in the HPI.      Objective:    BP 121/80   Pulse 80   Temp 98.1 F (36.7 C) (Oral)   Ht 5' 1.97" (1.574 m)   Wt 210 lb 12.8 oz (95.6 kg)   SpO2 99%   BMI 38.59 kg/m   Wt Readings from Last 3 Encounters:  11/10/22 210 lb 12.8 oz (95.6 kg)  07/27/22 219 lb 9.6 oz (99.6 kg)  06/22/22 223 lb 11.2 oz (101.5 kg)    Physical Exam Vitals and nursing note reviewed. Exam conducted with a chaperone present.  Constitutional:      General: She is awake. She is not in acute distress.    Appearance: She is well-developed and well-groomed. She is obese. She is not ill-appearing or toxic-appearing.  HENT:     Head: Normocephalic and atraumatic.     Right Ear: Hearing, tympanic membrane, ear canal and external ear normal. No drainage.     Left Ear: Hearing, tympanic membrane, ear canal and external ear normal. No drainage.     Ears:     Comments: Whisper test passed bilaterally at 6 feet.    Nose: Nose normal.     Right Sinus: No maxillary sinus tenderness or frontal sinus tenderness.     Left Sinus: No maxillary sinus tenderness or frontal sinus tenderness.     Mouth/Throat:     Mouth: Mucous membranes are moist.     Pharynx: Oropharynx is clear. Uvula midline. No pharyngeal swelling, oropharyngeal exudate or posterior oropharyngeal erythema.  Eyes:     General: Lids are normal.        Right eye: No discharge.        Left eye: No discharge.     Extraocular Movements: Extraocular movements intact.     Conjunctiva/sclera: Conjunctivae normal.     Pupils: Pupils are equal, round, and reactive to light.     Visual Fields: Right eye visual fields normal and  left eye visual fields normal.  Neck:     Thyroid: No thyromegaly.     Vascular: No carotid bruit.     Trachea: Trachea normal.  Cardiovascular:     Rate and Rhythm: Normal rate and regular rhythm.  Heart sounds: Normal heart sounds. No murmur heard.    No gallop.  Pulmonary:     Effort: Pulmonary effort is normal. No accessory muscle usage or respiratory distress.     Breath sounds: Normal breath sounds.  Chest:  Breasts:    Right: Normal.     Left: Normal.  Abdominal:     General: Bowel sounds are normal.     Palpations: Abdomen is soft. There is no hepatomegaly or splenomegaly.     Tenderness: There is no abdominal tenderness.  Musculoskeletal:        General: Normal range of motion.     Cervical back: Normal range of motion and neck supple.     Right lower leg: No edema.     Left lower leg: No edema.  Lymphadenopathy:     Head:     Right side of head: No submental, submandibular, tonsillar, preauricular or posterior auricular adenopathy.     Left side of head: No submental, submandibular, tonsillar, preauricular or posterior auricular adenopathy.     Cervical: No cervical adenopathy.     Upper Body:     Right upper body: No supraclavicular, axillary or pectoral adenopathy.     Left upper body: No supraclavicular, axillary or pectoral adenopathy.  Skin:    General: Skin is warm and dry.     Capillary Refill: Capillary refill takes less than 2 seconds.     Findings: No rash.  Neurological:     Mental Status: She is alert and oriented to person, place, and time.     Gait: Gait is intact.     Deep Tendon Reflexes: Reflexes are normal and symmetric.     Reflex Scores:      Brachioradialis reflexes are 2+ on the right side and 2+ on the left side.      Patellar reflexes are 2+ on the right side and 2+ on the left side. Psychiatric:        Attention and Perception: Attention normal.        Mood and Affect: Mood normal.        Speech: Speech normal.        Behavior:  Behavior normal. Behavior is cooperative.        Thought Content: Thought content normal.        Judgment: Judgment normal.    Results for orders placed or performed during the hospital encounter of 06/26/22  ECHOCARDIOGRAM COMPLETE  Result Value Ref Range   Ao pk vel 1.30 m/s   AV Area VTI 2.16 cm2   AR max vel 2.22 cm2   AV Mean grad 4.0 mmHg   AV Peak grad 6.8 mmHg   S' Lateral 3.17 cm   AV Area mean vel 2.03 cm2   Area-P 1/2 4.21 cm2      Assessment & Plan:   Problem List Items Addressed This Visit   None Visit Diagnoses     Encounter for examination required by Department of Transportation (DOT)    -  Primary   Overall stable exam, passed for 2 years, expires 11/10/2022.        Follow up plan: Return for needs new patient appointment.   PATIENT COUNSELING:    Advised to avoid cigarette smoking.  I discussed with the patient that most people either abstain from alcohol or drink within safe limits (<=14/week and <=4 drinks/occasion for males, <=7/weeks and <= 3 drinks/occasion for females) and that the risk for alcohol disorders and other health effects rises  proportionally with the number of drinks per week and how often a drinker exceeds daily limits.  Discussed cessation/primary prevention of drug use and availability of treatment for abuse.   Diet: Encouraged to adjust caloric intake to maintain  or achieve ideal body weight, to reduce intake of dietary saturated fat and total fat, to limit sodium intake by avoiding high sodium foods and not adding table salt, and to maintain adequate dietary potassium and calcium preferably from fresh fruits, vegetables, and low-fat dairy products.    Stressed the importance of regular exercise  Injury prevention: Discussed safety belts, safety helmets, smoke detector, smoking near bedding or upholstery.   Dental health: Discussed importance of regular tooth brushing, flossing, and dental visits.    NEXT PREVENTATIVE PHYSICAL  DUE IN 1 YEAR. Return for needs new patient appointment.

## 2022-12-18 ENCOUNTER — Other Ambulatory Visit: Payer: Self-pay | Admitting: Oncology

## 2022-12-23 ENCOUNTER — Inpatient Hospital Stay: Payer: Medicaid Other | Attending: Oncology

## 2022-12-23 DIAGNOSIS — T451X5A Adverse effect of antineoplastic and immunosuppressive drugs, initial encounter: Secondary | ICD-10-CM | POA: Insufficient documentation

## 2022-12-23 DIAGNOSIS — Z79811 Long term (current) use of aromatase inhibitors: Secondary | ICD-10-CM | POA: Diagnosis not present

## 2022-12-23 DIAGNOSIS — Z8049 Family history of malignant neoplasm of other genital organs: Secondary | ICD-10-CM | POA: Insufficient documentation

## 2022-12-23 DIAGNOSIS — Z170 Estrogen receptor positive status [ER+]: Secondary | ICD-10-CM | POA: Insufficient documentation

## 2022-12-23 DIAGNOSIS — Z801 Family history of malignant neoplasm of trachea, bronchus and lung: Secondary | ICD-10-CM | POA: Diagnosis not present

## 2022-12-23 DIAGNOSIS — C50311 Malignant neoplasm of lower-inner quadrant of right female breast: Secondary | ICD-10-CM | POA: Insufficient documentation

## 2022-12-23 DIAGNOSIS — G620 Drug-induced polyneuropathy: Secondary | ICD-10-CM | POA: Insufficient documentation

## 2022-12-23 DIAGNOSIS — Z90710 Acquired absence of both cervix and uterus: Secondary | ICD-10-CM | POA: Insufficient documentation

## 2022-12-23 LAB — CBC WITH DIFFERENTIAL/PLATELET
Abs Immature Granulocytes: 0.01 10*3/uL (ref 0.00–0.07)
Basophils Absolute: 0.1 10*3/uL (ref 0.0–0.1)
Basophils Relative: 1 %
Eosinophils Absolute: 0.2 10*3/uL (ref 0.0–0.5)
Eosinophils Relative: 2 %
HCT: 41.8 % (ref 36.0–46.0)
Hemoglobin: 13.4 g/dL (ref 12.0–15.0)
Immature Granulocytes: 0 %
Lymphocytes Relative: 46 %
Lymphs Abs: 3.6 10*3/uL (ref 0.7–4.0)
MCH: 28.3 pg (ref 26.0–34.0)
MCHC: 32.1 g/dL (ref 30.0–36.0)
MCV: 88.2 fL (ref 80.0–100.0)
Monocytes Absolute: 0.5 10*3/uL (ref 0.1–1.0)
Monocytes Relative: 7 %
Neutro Abs: 3.4 10*3/uL (ref 1.7–7.7)
Neutrophils Relative %: 44 %
Platelets: 306 10*3/uL (ref 150–400)
RBC: 4.74 MIL/uL (ref 3.87–5.11)
RDW: 12.6 % (ref 11.5–15.5)
WBC: 7.7 10*3/uL (ref 4.0–10.5)
nRBC: 0 % (ref 0.0–0.2)

## 2022-12-23 LAB — COMPREHENSIVE METABOLIC PANEL
ALT: 18 U/L (ref 0–44)
AST: 19 U/L (ref 15–41)
Albumin: 4.4 g/dL (ref 3.5–5.0)
Alkaline Phosphatase: 102 U/L (ref 38–126)
Anion gap: 10 (ref 5–15)
BUN: 10 mg/dL (ref 6–20)
CO2: 27 mmol/L (ref 22–32)
Calcium: 9.2 mg/dL (ref 8.9–10.3)
Chloride: 103 mmol/L (ref 98–111)
Creatinine, Ser: 0.82 mg/dL (ref 0.44–1.00)
GFR, Estimated: >60 mL/min (ref >60)
Glucose, Bld: 95 mg/dL (ref 70–99)
Potassium: 3.6 mmol/L (ref 3.5–5.1)
Sodium: 140 mmol/L (ref 135–145)
Total Bilirubin: 0.5 mg/dL (ref 0.3–1.2)
Total Protein: 8.1 g/dL (ref 6.5–8.1)

## 2022-12-24 LAB — CANCER ANTIGEN 27.29: CA 27.29: 25.9 U/mL (ref 0.0–38.6)

## 2022-12-25 ENCOUNTER — Ambulatory Visit: Payer: Managed Care, Other (non HMO) | Admitting: Radiation Oncology

## 2022-12-25 LAB — CANCER ANTIGEN 15-3: CA 15-3: 25.7 U/mL — ABNORMAL HIGH (ref 0.0–25.0)

## 2022-12-28 ENCOUNTER — Ambulatory Visit: Payer: Medicaid Other | Attending: Radiation Oncology | Admitting: Radiation Oncology

## 2022-12-30 ENCOUNTER — Encounter: Payer: Self-pay | Admitting: Oncology

## 2022-12-30 ENCOUNTER — Inpatient Hospital Stay: Payer: Medicaid Other

## 2022-12-30 ENCOUNTER — Inpatient Hospital Stay (HOSPITAL_BASED_OUTPATIENT_CLINIC_OR_DEPARTMENT_OTHER): Payer: Medicaid Other | Admitting: Oncology

## 2022-12-30 VITALS — BP 121/70 | HR 72 | Temp 98.9°F | Resp 18 | Wt 210.4 lb

## 2022-12-30 DIAGNOSIS — G620 Drug-induced polyneuropathy: Secondary | ICD-10-CM

## 2022-12-30 DIAGNOSIS — C50311 Malignant neoplasm of lower-inner quadrant of right female breast: Secondary | ICD-10-CM

## 2022-12-30 DIAGNOSIS — Z170 Estrogen receptor positive status [ER+]: Secondary | ICD-10-CM

## 2022-12-30 DIAGNOSIS — Z801 Family history of malignant neoplasm of trachea, bronchus and lung: Secondary | ICD-10-CM

## 2022-12-30 DIAGNOSIS — Z79811 Long term (current) use of aromatase inhibitors: Secondary | ICD-10-CM

## 2022-12-30 DIAGNOSIS — T451X5A Adverse effect of antineoplastic and immunosuppressive drugs, initial encounter: Secondary | ICD-10-CM

## 2022-12-30 DIAGNOSIS — Z8049 Family history of malignant neoplasm of other genital organs: Secondary | ICD-10-CM

## 2022-12-30 MED ORDER — ZOLEDRONIC ACID 4 MG/100ML IV SOLN
4.0000 mg | Freq: Once | INTRAVENOUS | Status: AC
  Administered 2022-12-30: 4 mg via INTRAVENOUS
  Filled 2022-12-30: qty 100

## 2022-12-30 MED ORDER — SODIUM CHLORIDE 0.9 % IV SOLN
INTRAVENOUS | Status: DC
  Filled 2022-12-30: qty 250

## 2022-12-30 MED ORDER — ANASTROZOLE 1 MG PO TABS: 1.0000 mg | ORAL_TABLET | Freq: Every day | ORAL | 1 refills | Status: DC

## 2022-12-30 NOTE — Assessment & Plan Note (Signed)
Bone health,05/26/2022 bone density-normal.  #Continue adjuvant Zometa every 6 months.  Plan adjuvant zometa for 3-5 years,

## 2022-12-30 NOTE — Progress Notes (Signed)
Hematology/Oncology Progress note [phone] [phone]   CHIEF COMPLAINT: F/u of right breast cancer   ASSESSMENT & PLAN:   Malignant neoplasm of lower-inner quadrant of right breast of female, estrogen receptor positive ([place])  Invasive right breast cancer,  -S/p 4 cycles of neoadjuvant ddAC and weekly Taxol x 12. Pathology ypT1c ypN1a(sn) 04/30/2020  finished adjuvant radiation. Previously on ovarian suppresion with Zoledex previously, followed by bilateraltotal hysterectomy with bilateral salpingo oophorectomy.  Patient is now on Arimidex  1 mg daily. Labs reviewed and discussed with patient. Continue Arimidex 1 mg daily Continue vitamin D supplementation. CA 15-3, CA 27-29 are increased.  I will obtain PET scan for evaluation   Aromatase inhibitor use Bone health,05/26/2022 bone density-normal.  #Continue adjuvant Zometa every 6 months.  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   Orders Placed This Encounter  Procedures   NM PET Image Initial (PI) Skull Base To Thigh    Standing Status:   Future    Standing Expiration Date:   12/30/2023    Scheduling Instructions:     Pt's insurance ends tomorrow. If pt can't have this done today or on 2/28, please let us know and we will switch to CT.    Order Specific Question:   If indicated for the ordered procedure, I authorize the administration of a radiopharmaceutical per Radiology protocol    Answer:   Yes    Order Specific Question:   Is the patient pregnant?    Answer:   No    Order Specific Question:   Preferred imaging location?    Answer:   [facility]   Follow-up to be determined. All questions were answered. The patient knows to call the clinic with any problems, questions or concerns.  Earlie Server, MD, PhD [facility] Hematology Oncology 12/30/2022   SUMMARY OF ONCOLOGIC HISTORY: Oncology History Overview Note  Cancer Staging Malignant  neoplasm of lower-inner quadrant of right breast of female, estrogen receptor positive ([place]) Staging form: Breast, AJCC 8th Edition - Clinical stage from 07/26/2019: Stage IIB (cT2, cN1, cM0, G3, ER+, PR+, HER2-) - Unsigned    Malignant neoplasm of lower-inner quadrant of right breast of female, estrogen receptor positive ([place])  07/20/2019 Mammogram   Mammogram 07/20/19  IMPRESSION: Suspicious palpable right breast mass 3.4 x 2.5 x 3.4 cm 5 o'clock position 3 cm from nipple.   Suspicious 1.6cm cortically thickened right axillary lymph node.   07/26/2019 Initial Diagnosis   DIAGNOSIS: 07/26/19 A. RIGHT BREAST, 5:00, 3CMFN; ULTRASOUND-GUIDED NEEDLE CORE BIOPSY:  - INVASIVE MAMMARY CARCINOMA, NO SPECIAL TYPE.    07/26/2019 Receptors her2   BREAST BIOMARKER TESTS  Estrogen Receptor (ER) Status: POSITIVE                       Percentage of cells with nuclear positivity: 51-90%                       Average intensity of staining: Strong   Progesterone Receptor (PgR) Status: POSITIVE                       Percentage of cells with nuclear positivity:  Greater than 90%                       Average intensity of staining: Strong   HER2 (by immunohistochemistry): NEGATIVE (Score 1+)  08/05/2019 Initial Diagnosis   Invasive carcinoma of breast ([place])   08/17/2019 Surgery   INSERTION PORT-A-CATH by Dr. Lysle Pearl 08/17/19  Mediport was removed in June 2022   08/18/2019 - 01/01/2020 Chemotherapy   AC q2weeks for 4 cycles starting 08/18/19 followed by weekly Taxol for 12 weeks    11/13/2019 Genetic Testing   Negative genetic testing. No pathogenic variants identified on the [facility] Common Hereditary Cancers Panel. The report date is 11/13/2019.   The Common Hereditary Cancers Panel offered by [facility] includes sequencing and/or deletion duplication testing of the following 48 genes: APC, ATM, AXIN2, BARD1, BMPR1A, BRCA1, BRCA2, BRIP1, CDH1, CDKN2A (p14ARF), CDKN2A (p16INK4a), CKD4, CHEK2, CTNNA1,  DICER1, EPCAM (Deletion/duplication testing only), GREM1 (promoter region deletion/duplication testing only), KIT, MEN1, MLH1, MSH2, MSH3, MSH6, MUTYH, NBN, NF1, NHTL1, PALB2, PDGFRA, PMS2, POLD1, POLE, PTEN, RAD50, RAD51C, RAD51D, RNF43, SDHB, SDHC, SDHD, SMAD4, SMARCA4. STK11, TP53, TSC1, TSC2, and VHL.  The following genes were evaluated for sequence changes only: SDHA and HOXB13 c.251G>A variant only.   02/01/2020 Surgery   s/p right lumpectomy and axillary lymph node dissection. ypT1c ypN1a(sn)  ER/PR positive, HER2 negative, residual invasive mammary carcinoma, grade 3. 1/3 sentinel lymph node positive and 2/10 right axillary lymph nodes positive. DCIS present. Margin was negative for invasive carcinoma and DCIS    04/30/2020 -  Radiation Therapy   Finished adjuvant radiation    05/2020 -  Anti-estrogen oral therapy   started ovarian suppression with Zoladex monthly, started Arimidex 1 mg daily. 03/11/21 last dose of Zoledex   03/21/2021 Surgery   Surgical ovarian suppression via TAH-BSO   09/22/2021 Imaging   Bilateral diagnostic mammogram showed No evidence of malignancy within either breast. Stable postsurgical changes within the RIGHT breast.    11/19/2021 Imaging    bilateral MRI breast with and without contrast showed lumpectomy scar in the central to slightly inner right breast corresponds to the mass seen in the right breast on recent CT.  No MRI evidence of malignancy in either breast.   05/26/2022 Imaging   DEXA showed normal bone density   06/26/2022 Echocardiogram   LVEF [id number], Grade I diastolic dysfunction      #Neuropathy due to chemotherapy, pre-existing sciatica, symptoms are resolved, and she is not on any neuropathy medication.  History of right kidney stone, there was concern of calcium calculus.  She is off calcium    INTERVAL HISTORY:  Jessica [name] presents to follow-up for breast cancer. She takes Arimidex 1 mg daily she tolerates with manageable side  effects. Patient has no new complaints.  She has no new breast concerns. + Numbness tingling of lower extremity.  Stable  Review of Systems  Constitutional:  Positive for fatigue. Negative for appetite change, chills and fever.  HENT:   Negative for hearing loss and voice change.   Eyes:  Negative for eye problems.  Respiratory:  Negative for chest tightness and cough.   Cardiovascular:  Positive for leg swelling. Negative for chest pain.  Gastrointestinal:  Negative for abdominal distention, abdominal pain and blood in stool.  Endocrine: Positive for hot flashes.  Genitourinary:  Negative for difficulty urinating and frequency.   Musculoskeletal:  Negative for arthralgias.  Skin:  Negative for itching and rash.  Neurological:  Positive for numbness. Negative for extremity weakness.  Hematological:  Negative for adenopathy.  Psychiatric/Behavioral:  Negative for confusion and sleep disturbance.     MEDICAL HISTORY:  Past Medical History:  Diagnosis Date   Anemia    Arthritis  left ankle   Breast cancer ([place]) 07/23/2019   right breast- [facility]   Cancer ([place])    breast cancer   Family history of cervical cancer    Family history of lung cancer    Frequency    GERD (gastroesophageal reflux disease)    History of kidney stones    h/o   Obesity    Personal history of chemotherapy    Personal history of radiation therapy    Right flank pain     SURGICAL HISTORY: Past Surgical History:  Procedure Laterality Date   AXILLARY LYMPH NODE DISSECTION Right 02/01/2020   Procedure: AXILLARY LYMPH NODE DISSECTION;  Surgeon: Benjamine Sprague, DO;  Location: [facility] ORS;  Service: General;  Laterality: Right;   BREAST BIOPSY Right 2020   [facility]   BREAST LUMPECTOMY Right 02/01/2020   pt has neo adj chemo and post radation with LN removed   CESAREAN SECTION     x 2   CHOLECYSTECTOMY     DILATION AND CURETTAGE OF UTERUS     LITHOTRIPSY  AROUND 2010   PORTACATH PLACEMENT Left 08/17/2019    Procedure: INSERTION PORT-A-CATH;  Surgeon: Benjamine Sprague, DO;  Location: [facility] ORS;  Service: General;  Laterality: Left;   ROBOTIC ASSISTED TOTAL HYSTERECTOMY WITH BILATERAL SALPINGO OOPHERECTOMY Bilateral 03/21/2021   Procedure: XI ROBOTIC ASSISTED TOTAL HYSTERECTOMY WITH BILATERAL SALPINGO OOPHORECTOMY;  Surgeon: Benjaman Kindler, MD;  Location: [facility] ORS;  Service: Gynecology;  Laterality: Bilateral;   TUBAL LIGATION      I have reviewed the social history and family history with the patient and they are unchanged from previous note.  ALLERGIES:  has No Known Allergies.  MEDICATIONS:  Current Outpatient Medications  Medication Sig Dispense Refill   fluticasone (FLONASE) 50 MCG/ACT nasal spray SPRAY 1 SPRAY INTO BOTH NOSTRILS DAILY. 16 mL 2   anastrozole (ARIMIDEX) 1 MG tablet Take 1 tablet (1 mg total) by mouth daily. 90 tablet 1   Aspirin-Acetaminophen-Caffeine (GOODYS EXTRA STRENGTH) 500-325-65 MG PACK Take 1 packet by mouth daily as needed (pain). (Patient not taking: Reported on 12/30/2022)     buPROPion (WELLBUTRIN XL) 150 MG 24 hr tablet Take 1 tablet by mouth daily. (Patient not taking: Reported on 11/10/2022)     fluticasone (FLONASE) 50 MCG/ACT nasal spray Place into both nostrils as needed for rhinitis.     furosemide (LASIX) 20 MG tablet Take 20 mg by mouth daily. (Patient not taking: Reported on 12/30/2022)     pantoprazole (PROTONIX) 40 MG tablet Take 1 tablet (40 mg total) by mouth daily. (Patient not taking: Reported on 11/10/2022) 90 tablet 1   No current facility-administered medications for this visit.    PHYSICAL EXAMINATION: ECOG PERFORMANCE STATUS: 1 - Symptomatic but completely ambulatory  Vitals:   12/30/22 1317  BP: 121/70  Pulse: 72  Resp: 18  Temp: 98.9 F (37.2 C)   Filed Weights   12/30/22 1317  Weight: 210 lb 6.4 oz (95.4 kg)   Physical Exam Constitutional:      General: She is not in acute distress.    Appearance: She is not diaphoretic.  HENT:      Head: Normocephalic and atraumatic.     Nose: Nose normal.     Mouth/Throat:     Pharynx: No oropharyngeal exudate.  Eyes:     General: No scleral icterus.    Pupils: Pupils are equal, round, and reactive to light.  Cardiovascular:     Rate and Rhythm: Normal rate and regular rhythm.  Heart sounds: No murmur heard. Pulmonary:     Effort: Pulmonary effort is normal. No respiratory distress.     Breath sounds: No rales.  Chest:     Chest wall: No tenderness.  Abdominal:     General: There is no distension.     Palpations: Abdomen is soft.     Tenderness: There is no abdominal tenderness.  Musculoskeletal:        General: Swelling present. Normal range of motion.     Cervical back: Normal range of motion and neck supple.     Right lower leg: Edema present.     Left lower leg: Edema present.  Skin:    General: Skin is warm and dry.     Findings: No erythema.  Neurological:     Mental Status: She is alert and oriented to person, place, and time.     Cranial Nerves: No cranial nerve deficit.     Motor: No abnormal muscle tone.     Coordination: Coordination normal.  Psychiatric:        Mood and Affect: Affect normal.      LABORATORY DATA:  I have reviewed the data as listed    Latest Ref Rng & Units 12/23/2022   11:36 AM 06/16/2022    1:46 PM 12/15/2021    9:33 AM  CBC  WBC 4.0 - 10.5 K/uL 7.7  7.2  6.2   Hemoglobin 12.0 - 15.0 g/dL 13.4  12.9  12.6   Hematocrit 36.0 - 46.0 % 41.8  40.6  39.1   Platelets 150 - 400 K/uL 306  286  274         Latest Ref Rng & Units 12/23/2022   11:36 AM 06/16/2022    1:46 PM 12/15/2021    9:33 AM  CMP  Glucose 70 - 99 mg/dL 95  175  102   BUN 6 - 20 mg/dL '10  12  11   '$ Creatinine 0.44 - 1.00 mg/dL 0.82  0.83  0.73   Sodium 135 - 145 mmol/L 140  139  138   Potassium 3.5 - 5.1 mmol/L 3.6  3.7  3.6   Chloride 98 - 111 mmol/L 103  103  102   CO2 22 - 32 mmol/L '27  26  29   '$ Calcium 8.9 - 10.3 mg/dL 9.2  9.1  9.1   Total Protein 6.5 -  8.1 g/dL 8.1  7.8  7.8   Total Bilirubin 0.3 - 1.2 mg/dL 0.5  0.8  0.3   Alkaline Phos 38 - 126 U/L 102  93  91   AST 15 - 41 U/L '19  23  20   '$ ALT 0 - 44 U/L '18  21  19       '$ RADIOGRAPHIC STUDIES: I have personally reviewed the radiological images as listed and agreed with the findings in the report. MM DIAG BREAST TOMO BILATERAL  Result Date: 10/21/2022 CLINICAL DATA:  Annual surveillance. History of a right lumpectomy for breast carcinoma performed on 02/01/2020, treated with adjuvant radiation and chemotherapy. No current breast complaints. EXAM: DIGITAL DIAGNOSTIC BILATERAL MAMMOGRAM WITH TOMOSYNTHESIS TECHNIQUE: Bilateral digital diagnostic mammography and breast tomosynthesis was performed. COMPARISON:  Previous exam(s). ACR Breast Density Category b: There are scattered areas of fibroglandular density. FINDINGS: Post lumpectomy changes in the inner right breast are without significant change. There are no breast masses, areas of nonsurgical architectural distortion, areas of significant asymmetry or suspicious calcifications. IMPRESSION: 1. No evidence of new or recurrent breast  carcinoma. RECOMMENDATION: Screening mammogram in one year.(Code:SM-B-01Y) I have discussed the findings and recommendations with the patient. If applicable, a reminder letter will be sent to the patient regarding the next appointment. BI-RADS CATEGORY  2: Benign. Electronically Signed   By: Lajean Manes M.D.   On: 10/21/2022 10:52

## 2022-12-30 NOTE — Assessment & Plan Note (Signed)
Invasive right breast cancer,  -S/p 4 cycles of neoadjuvant ddAC and weekly Taxol x 12. Pathology ypT1c ypN1a(sn) 04/30/2020  finished adjuvant radiation. Previously on ovarian suppresion with Zoledex previously, followed by bilateraltotal hysterectomy with bilateral salpingo oophorectomy.  Patient is now on Arimidex  1 mg daily. Labs reviewed and discussed with patient. Continue Arimidex 1 mg daily Continue vitamin D supplementation. CA 15-3, CA 27-29 are increased.  I will obtain PET scan for evaluation

## 2022-12-30 NOTE — Progress Notes (Signed)
Pt here for follow up. No new concerns voiced.  NO new breast concerns

## 2022-12-30 NOTE — Assessment & Plan Note (Signed)
Stable. 

## 2022-12-30 NOTE — Patient Instructions (Signed)

## 2022-12-31 ENCOUNTER — Encounter: Payer: Self-pay | Admitting: Oncology

## 2022-12-31 ENCOUNTER — Ambulatory Visit
Admission: RE | Admit: 2022-12-31 | Discharge: 2022-12-31 | Disposition: A | Discharge status: Home / Self Care | Payer: Medicaid Other | Source: Ambulatory Visit | Attending: Oncology | Admitting: Oncology

## 2022-12-31 DIAGNOSIS — R590 Localized enlarged lymph nodes: Secondary | ICD-10-CM | POA: Diagnosis not present

## 2022-12-31 DIAGNOSIS — C50311 Malignant neoplasm of lower-inner quadrant of right female breast: Secondary | ICD-10-CM | POA: Diagnosis not present

## 2022-12-31 DIAGNOSIS — R937 Abnormal findings on diagnostic imaging of other parts of musculoskeletal system: Secondary | ICD-10-CM | POA: Insufficient documentation

## 2022-12-31 DIAGNOSIS — Z170 Estrogen receptor positive status [ER+]: Secondary | ICD-10-CM | POA: Diagnosis not present

## 2022-12-31 DIAGNOSIS — C50911 Malignant neoplasm of unspecified site of right female breast: Secondary | ICD-10-CM | POA: Diagnosis present

## 2022-12-31 LAB — GLUCOSE, CAPILLARY: Glucose-Capillary: 64 mg/dL — ABNORMAL LOW (ref 70–99)

## 2022-12-31 MED ORDER — FLUDEOXYGLUCOSE F - 18 (FDG) INJECTION
11.4400 | Freq: Once | INTRAVENOUS | Status: AC | PRN
  Administered 2022-12-31: 11.44 via INTRAVENOUS

## 2022-12-31 NOTE — Telephone Encounter (Signed)
FYI

## 2023-01-04 ENCOUNTER — Encounter: Payer: Self-pay | Admitting: Oncology

## 2023-01-04 NOTE — Telephone Encounter (Signed)
Please advise on scheduling follow up.

## 2023-01-06 ENCOUNTER — Telehealth: Payer: Self-pay

## 2023-01-06 DIAGNOSIS — R948 Abnormal results of function studies of other organs and systems: Secondary | ICD-10-CM

## 2023-01-06 DIAGNOSIS — C50311 Malignant neoplasm of lower-inner quadrant of right female breast: Secondary | ICD-10-CM

## 2023-01-06 NOTE — Telephone Encounter (Signed)
-----   Message from Earlie Server, MD sent at 01/05/2023 11:07 PM EST ----- Please let patient know that I discussed with IR, the current areas of concern are all too small to be biopsied. I recommend bilateral breast MRI w contrast to further evaluate her breasts. If she agrees, please arrange. Thanks.

## 2023-01-06 NOTE — Telephone Encounter (Signed)
Pt has been informed of plan for MRI, via mychart.   Please schedule and inform pt of appt:   MRI breast

## 2023-01-13 ENCOUNTER — Ambulatory Visit
Admission: RE | Admit: 2023-01-13 | Discharge: 2023-01-13 | Disposition: A | Discharge status: Home / Self Care | Payer: Medicaid Other | Source: Ambulatory Visit | Attending: Oncology | Admitting: Oncology

## 2023-01-13 DIAGNOSIS — R948 Abnormal results of function studies of other organs and systems: Secondary | ICD-10-CM

## 2023-01-13 MED ORDER — GADOBUTROL 1 MMOL/ML IV SOLN
9.0000 mL | Freq: Once | INTRAVENOUS | Status: AC | PRN
  Administered 2023-01-13: 9 mL via INTRAVENOUS

## 2023-01-15 ENCOUNTER — Telehealth: Payer: Self-pay | Admitting: Oncology

## 2023-01-15 ENCOUNTER — Encounter: Payer: Self-pay | Admitting: Oncology

## 2023-01-15 NOTE — Telephone Encounter (Signed)
Please schedule pt and inform pt of appts:   Labs in early April (ca 27.29, ca15.3) Labs during the week of 5/20 (cbc, cmp, ca27.29, ca15.3) MD at least 3 days after labs

## 2023-01-15 NOTE — Telephone Encounter (Signed)
appt reminder sent via mail

## 2023-02-08 ENCOUNTER — Other Ambulatory Visit: Payer: Self-pay

## 2023-02-08 DIAGNOSIS — Z170 Estrogen receptor positive status [ER+]: Secondary | ICD-10-CM

## 2023-02-09 ENCOUNTER — Inpatient Hospital Stay: Payer: Medicaid Other | Attending: Oncology

## 2023-02-09 DIAGNOSIS — Z79811 Long term (current) use of aromatase inhibitors: Secondary | ICD-10-CM | POA: Insufficient documentation

## 2023-02-09 DIAGNOSIS — G620 Drug-induced polyneuropathy: Secondary | ICD-10-CM | POA: Diagnosis not present

## 2023-02-09 DIAGNOSIS — Z170 Estrogen receptor positive status [ER+]: Secondary | ICD-10-CM | POA: Insufficient documentation

## 2023-02-09 DIAGNOSIS — C50311 Malignant neoplasm of lower-inner quadrant of right female breast: Secondary | ICD-10-CM | POA: Diagnosis present

## 2023-02-10 LAB — CANCER ANTIGEN 27.29: CA 27.29: 31.1 U/mL (ref 0.0–38.6)

## 2023-02-10 LAB — CANCER ANTIGEN 15-3: CA 15-3: 25.9 U/mL — ABNORMAL HIGH (ref 0.0–25.0)

## 2023-03-23 ENCOUNTER — Other Ambulatory Visit: Payer: Self-pay

## 2023-03-23 DIAGNOSIS — C50311 Malignant neoplasm of lower-inner quadrant of right female breast: Secondary | ICD-10-CM

## 2023-03-24 ENCOUNTER — Other Ambulatory Visit: Payer: Medicaid Other

## 2023-03-24 ENCOUNTER — Inpatient Hospital Stay: Payer: Medicaid Other | Attending: Oncology

## 2023-03-24 DIAGNOSIS — Z90722 Acquired absence of ovaries, bilateral: Secondary | ICD-10-CM | POA: Insufficient documentation

## 2023-03-24 DIAGNOSIS — C7951 Secondary malignant neoplasm of bone: Secondary | ICD-10-CM | POA: Diagnosis not present

## 2023-03-24 DIAGNOSIS — Z90710 Acquired absence of both cervix and uterus: Secondary | ICD-10-CM | POA: Diagnosis not present

## 2023-03-24 DIAGNOSIS — C50311 Malignant neoplasm of lower-inner quadrant of right female breast: Secondary | ICD-10-CM | POA: Diagnosis present

## 2023-03-24 DIAGNOSIS — Z801 Family history of malignant neoplasm of trachea, bronchus and lung: Secondary | ICD-10-CM | POA: Insufficient documentation

## 2023-03-24 DIAGNOSIS — Z170 Estrogen receptor positive status [ER+]: Secondary | ICD-10-CM | POA: Insufficient documentation

## 2023-03-24 DIAGNOSIS — Z8049 Family history of malignant neoplasm of other genital organs: Secondary | ICD-10-CM | POA: Diagnosis not present

## 2023-03-24 DIAGNOSIS — Z79811 Long term (current) use of aromatase inhibitors: Secondary | ICD-10-CM | POA: Diagnosis not present

## 2023-03-24 DIAGNOSIS — G620 Drug-induced polyneuropathy: Secondary | ICD-10-CM | POA: Insufficient documentation

## 2023-03-24 DIAGNOSIS — F064 Anxiety disorder due to known physiological condition: Secondary | ICD-10-CM | POA: Insufficient documentation

## 2023-03-24 LAB — CMP (CANCER CENTER ONLY)
ALT: 18 U/L (ref 0–44)
AST: 22 U/L (ref 15–41)
Albumin: 3.9 g/dL (ref 3.5–5.0)
Alkaline Phosphatase: 88 U/L (ref 38–126)
Anion gap: 12 (ref 5–15)
BUN: 13 mg/dL (ref 6–20)
CO2: 26 mmol/L (ref 22–32)
Calcium: 9.2 mg/dL (ref 8.9–10.3)
Chloride: 102 mmol/L (ref 98–111)
Creatinine: 0.76 mg/dL (ref 0.44–1.00)
GFR, Estimated: >60 mL/min (ref >60)
Glucose, Bld: 127 mg/dL — ABNORMAL HIGH (ref 70–99)
Potassium: 3.6 mmol/L (ref 3.5–5.1)
Sodium: 140 mmol/L (ref 135–145)
Total Bilirubin: 0.4 mg/dL (ref 0.3–1.2)
Total Protein: 7.5 g/dL (ref 6.5–8.1)

## 2023-03-24 LAB — CBC WITH DIFFERENTIAL (CANCER CENTER ONLY)
Abs Immature Granulocytes: 0.02 10*3/uL (ref 0.00–0.07)
Basophils Absolute: 0 10*3/uL (ref 0.0–0.1)
Basophils Relative: 1 %
Eosinophils Absolute: 0.1 10*3/uL (ref 0.0–0.5)
Eosinophils Relative: 2 %
HCT: 38 % (ref 36.0–46.0)
Hemoglobin: 12.2 g/dL (ref 12.0–15.0)
Immature Granulocytes: 0 %
Lymphocytes Relative: 48 %
Lymphs Abs: 3 10*3/uL (ref 0.7–4.0)
MCH: 28.2 pg (ref 26.0–34.0)
MCHC: 32.1 g/dL (ref 30.0–36.0)
MCV: 87.8 fL (ref 80.0–100.0)
Monocytes Absolute: 0.4 10*3/uL (ref 0.1–1.0)
Monocytes Relative: 6 %
Neutro Abs: 2.6 10*3/uL (ref 1.7–7.7)
Neutrophils Relative %: 43 %
Platelet Count: 235 10*3/uL (ref 150–400)
RBC: 4.33 MIL/uL (ref 3.87–5.11)
RDW: 12.7 % (ref 11.5–15.5)
WBC Count: 6.1 10*3/uL (ref 4.0–10.5)
nRBC: 0 % (ref 0.0–0.2)

## 2023-03-25 LAB — CANCER ANTIGEN 15-3: CA 15-3: 25.9 U/mL — ABNORMAL HIGH (ref 0.0–25.0)

## 2023-03-25 LAB — CANCER ANTIGEN 27.29: CA 27.29: 30.9 U/mL (ref 0.0–38.6)

## 2023-03-31 ENCOUNTER — Encounter: Payer: Self-pay | Admitting: Oncology

## 2023-03-31 ENCOUNTER — Inpatient Hospital Stay (HOSPITAL_BASED_OUTPATIENT_CLINIC_OR_DEPARTMENT_OTHER): Payer: Medicaid Other | Admitting: Oncology

## 2023-03-31 VITALS — BP 121/75 | HR 98 | Temp 98.2°F | Resp 18 | Wt 211.2 lb

## 2023-03-31 DIAGNOSIS — Z79811 Long term (current) use of aromatase inhibitors: Secondary | ICD-10-CM

## 2023-03-31 DIAGNOSIS — F411 Generalized anxiety disorder: Secondary | ICD-10-CM

## 2023-03-31 DIAGNOSIS — C50311 Malignant neoplasm of lower-inner quadrant of right female breast: Secondary | ICD-10-CM | POA: Diagnosis not present

## 2023-03-31 DIAGNOSIS — T451X5A Adverse effect of antineoplastic and immunosuppressive drugs, initial encounter: Secondary | ICD-10-CM

## 2023-03-31 DIAGNOSIS — G620 Drug-induced polyneuropathy: Secondary | ICD-10-CM

## 2023-03-31 DIAGNOSIS — Z170 Estrogen receptor positive status [ER+]: Secondary | ICD-10-CM

## 2023-03-31 DIAGNOSIS — C801 Malignant (primary) neoplasm, unspecified: Secondary | ICD-10-CM | POA: Insufficient documentation

## 2023-03-31 MED ORDER — ALPRAZOLAM 0.5 MG PO TABS: 0.5000 mg | ORAL_TABLET | Freq: Two times a day (BID) | ORAL | 0 refills | Status: DC | PRN

## 2023-03-31 NOTE — Assessment & Plan Note (Signed)
Bone health,05/26/2022 bone density-normal.  #Continue adjuvant Zometa every 6 months.  Plan adjuvant zometa for 3-5 years,  March 2024 PET scan showed sclerotic metastatic 6 involving anterior T12, L2 without hypermetabolism.  Favoring treated metastasis.  Equivocal sclerotic metastasis involving the left iliac bone.  Pending repeat PET scan for further evaluation.  Consider increase Zometa frequency to every 3 months.

## 2023-03-31 NOTE — Progress Notes (Signed)
Pt here for follow up. Pt report occasional sharp pain to outer right breast area.

## 2023-03-31 NOTE — Progress Notes (Signed)
Hematology/Oncology Progress note [phone] [phone]   CHIEF COMPLAINT: F/u of right breast cancer   ASSESSMENT & PLAN:   Malignant neoplasm of lower-inner quadrant of right breast of female, estrogen receptor positive (HCC)  Invasive right breast cancer,  -S/p 4 cycles of neoadjuvant ddAC and weekly Taxol x 12. Pathology ypT1c ypN1a(sn) 04/30/2020  finished adjuvant radiation. Previously on ovarian suppresion with Zoledex previously, followed by bilateraltotal hysterectomy with bilateral salpingo oophorectomy.  Labs reviewed and discussed with patient. Continue Arimidex 1 mg daily Continue vitamin D supplementation. CA 15-3, CA 27-29 levels are stable.  01/01/2023, PET scan results were reviewed and discussed with patient.  I have also discussed with IR.  Supraclavicular lymph node was too small to be biopsied.  MRI breast did not reveal any subpectorally lesion recurrence or significant axillary lymphadenopathy   Repeat PET    Neuropathy due to chemotherapeutic drug (HCC) Stable.  Aromatase inhibitor use Bone health,05/26/2022 bone density-normal.  #Continue adjuvant Zometa every 6 months.  Plan adjuvant zometa for 3-5 years,  March 2024 PET scan showed sclerotic metastatic 6 involving anterior T12, L2 without hypermetabolism.  Favoring treated metastasis.  Equivocal sclerotic metastasis involving the left iliac bone.  Pending repeat PET scan for further evaluation.  Consider increase Zometa frequency to every 3 months.  Anxiety associated with cancer diagnosis (HCC) Recommend Xanax 0.5 mg every 12 hours as needed for anxiety.  Rationale and potential side effects were reviewed and discussed with patient.    Orders Placed This Encounter  Procedures   NM PET Image Restage (PS) Skull Base to Thigh (F-18 FDG)    Standing Status:   Future    Standing Expiration Date:   03/30/2024    Order Specific Question:   If indicated for the ordered procedure, I  authorize the administration of a radiopharmaceutical per Radiology protocol    Answer:   Yes    Order Specific Question:   Preferred imaging location?    Answer:   [facility]    Order Specific Question:   Radiology Contrast Protocol - do NOT remove file path    Answer:   [email or web address]    Order Specific Question:   Is the patient pregnant?    Answer:   No   CBC with Differential (Cancer Center Only)    Standing Status:   Future    Standing Expiration Date:   03/30/2024   CMP (Cancer Center only)    Standing Status:   Future    Standing Expiration Date:   03/30/2024   Cancer antigen 27.29    Standing Status:   Future    Standing Expiration Date:   03/30/2024   Cancer antigen 15-3    Standing Status:   Future    Standing Expiration Date:   03/30/2024   Follow-up after repeat PET scan. All questions were answered. The patient knows to call the clinic with any problems, questions or concerns.  Rickard Patience, MD, PhD [facility] Hematology Oncology 03/31/2023   SUMMARY OF ONCOLOGIC HISTORY: Oncology History Overview Note  Cancer Staging Malignant neoplasm of lower-inner quadrant of right breast of female, estrogen receptor positive (HCC) Staging form: Breast, AJCC 8th Edition - Clinical stage from 07/26/2019: Stage IIB (cT2, cN1, cM0, G3, ER+, PR+, HER2-) - Unsigned    Malignant neoplasm of lower-inner quadrant of right breast of female, estrogen receptor positive (HCC)  07/20/2019 Mammogram   Mammogram 07/20/19  IMPRESSION: Suspicious palpable right breast mass 3.4 x 2.5 x 3.4 cm 5 o'clock  position 3 cm from nipple.   Suspicious 1.6cm cortically thickened right axillary lymph node.   07/26/2019 Initial Diagnosis   DIAGNOSIS: 07/26/19 A. RIGHT BREAST, 5:00, 3CMFN; ULTRASOUND-GUIDED NEEDLE CORE BIOPSY:  - INVASIVE MAMMARY CARCINOMA, NO SPECIAL TYPE.    07/26/2019 Receptors her2   BREAST BIOMARKER TESTS  Estrogen Receptor (ER) Status:  POSITIVE                       Percentage of cells with nuclear positivity: 51-90%                       Average intensity of staining: Strong   Progesterone Receptor (PgR) Status: POSITIVE                       Percentage of cells with nuclear positivity:  Greater than 90%                       Average intensity of staining: Strong   HER2 (by immunohistochemistry): NEGATIVE (Score 1+)    08/05/2019 Initial Diagnosis   Invasive carcinoma of breast (HCC)   08/17/2019 Surgery   INSERTION PORT-A-CATH by Dr. Tonna Boehringer 08/17/19  Mediport was removed in June 2022   08/18/2019 - 01/01/2020 Chemotherapy   AC q2weeks for 4 cycles starting 08/18/19 followed by weekly Taxol for 12 weeks    11/13/2019 Genetic Testing   Negative genetic testing. No pathogenic variants identified on the [facility] Common Hereditary Cancers Panel. The report date is 11/13/2019.   The Common Hereditary Cancers Panel offered by [facility] includes sequencing and/or deletion duplication testing of the following 48 genes: APC, ATM, AXIN2, BARD1, BMPR1A, BRCA1, BRCA2, BRIP1, CDH1, CDKN2A (p14ARF), CDKN2A (p16INK4a), CKD4, CHEK2, CTNNA1, DICER1, EPCAM (Deletion/duplication testing only), GREM1 (promoter region deletion/duplication testing only), KIT, MEN1, MLH1, MSH2, MSH3, MSH6, MUTYH, NBN, NF1, NHTL1, PALB2, PDGFRA, PMS2, POLD1, POLE, PTEN, RAD50, RAD51C, RAD51D, RNF43, SDHB, SDHC, SDHD, SMAD4, SMARCA4. STK11, TP53, TSC1, TSC2, and VHL.  The following genes were evaluated for sequence changes only: SDHA and HOXB13 c.251G>A variant only.   02/01/2020 Surgery   s/p right lumpectomy and axillary lymph node dissection. ypT1c ypN1a(sn)  ER/PR positive, HER2 negative, residual invasive mammary carcinoma, grade 3. 1/3 sentinel lymph node positive and 2/10 right axillary lymph nodes positive. DCIS present. Margin was negative for invasive carcinoma and DCIS    04/30/2020 -  Radiation Therapy   Finished adjuvant radiation    05/2020 -   Anti-estrogen oral therapy   started ovarian suppression with Zoladex monthly, started Arimidex 1 mg daily. 03/11/21 last dose of Zoledex   03/21/2021 Surgery   Surgical ovarian suppression via TAH-BSO   09/22/2021 Imaging   Bilateral diagnostic mammogram showed No evidence of malignancy within either breast. Stable postsurgical changes within the RIGHT breast.    11/19/2021 Imaging    bilateral MRI breast with and without contrast showed lumpectomy scar in the central to slightly inner right breast corresponds to the mass seen in the right breast on recent CT.  No MRI evidence of malignancy in either breast.   05/26/2022 Imaging   DEXA showed normal bone density   06/26/2022 Echocardiogram   LVEF 60-65%, Grade I diastolic dysfunction    10/21/2022 Imaging   Bilateral diagnostic mammogram showed no evidence of new or recurrent breast carcinoma.   01/01/2023 Imaging   PET scan showed 5 mm short axis right supraclavicular node (series 4/image 35), max SUV  3.9, suspicious for nodal metastasis. Vague hypermetabolism in the right axilla max SUV 3.2 (PET image 33), without corresponding node on, equivocal C,  additional areas of focal hypermetabolism in the right subpectoral region (PET images 38 and 39), max SUV 11.3, without well-defined nodes but raising concern for recurrence. Equivocal 5 mm short axis left axillary node (series 4/image 30), but max SUV 4.5, raising concern for contralateral nodal metastasis.  Focal hypermetabolism with sclerosis involving the left medial femoral head (series 4/image 133), favoring a vascular necrosis. Max SUV 5.8.   Additional 14 mm sclerotic metastasis involving the anterior T12 vertebral body (series 4/image 32) and 3.5 cm sclerotic metastasis involving the left lamina at L2 (series 4/image 89), without hypermetabolism, favoring treated metastasis. Equivocal sclerotic metastasis involving the left iliac bone    01/13/2023 Imaging   MRI breast bilateral  with and without contrast showed 1. No evidence of new or recurrent breast malignancy. 2. No breast MRI findings of metastatic disease on the included field of view. 3. Benign post lumpectomy changes on the right, stable     #Neuropathy due to chemotherapy, pre-existing sciatica, symptoms are resolved, and she is not on any neuropathy medication.  History of right kidney stone, there was concern of calcium calculus.  She is off calcium    INTERVAL HISTORY:  Jessica [name] presents to follow-up for breast cancer. She takes Arimidex 1 mg daily she tolerates with manageable side effects. Patient has no new complaints.  She has no new breast concerns. + Numbness tingling of lower extremity.  Stable  Review of Systems  Constitutional:  Positive for fatigue. Negative for appetite change, chills and fever.  HENT:   Negative for hearing loss and voice change.   Eyes:  Negative for eye problems.  Respiratory:  Negative for chest tightness and cough.   Cardiovascular:  Positive for leg swelling. Negative for chest pain.  Gastrointestinal:  Negative for abdominal distention, abdominal pain and blood in stool.  Endocrine: Positive for hot flashes.  Genitourinary:  Negative for difficulty urinating and frequency.   Musculoskeletal:  Negative for arthralgias.  Skin:  Negative for itching and rash.  Neurological:  Positive for numbness. Negative for extremity weakness.  Hematological:  Negative for adenopathy.  Psychiatric/Behavioral:  Negative for confusion and sleep disturbance.     MEDICAL HISTORY:  Past Medical History:  Diagnosis Date   Anemia    Arthritis    left ankle   Breast cancer (HCC) 07/23/2019   right breast- [facility]   Cancer (HCC)    breast cancer   Family history of cervical cancer    Family history of lung cancer    Frequency    GERD (gastroesophageal reflux disease)    History of kidney stones    h/o   Obesity    Personal history of chemotherapy    Personal  history of radiation therapy    Right flank pain     SURGICAL HISTORY: Past Surgical History:  Procedure Laterality Date   AXILLARY LYMPH NODE DISSECTION Right 02/01/2020   Procedure: AXILLARY LYMPH NODE DISSECTION;  Surgeon: Sung Amabile, DO;  Location: [facility] ORS;  Service: General;  Laterality: Right;   BREAST BIOPSY Right 2020   [facility]   BREAST LUMPECTOMY Right 02/01/2020   pt has neo adj chemo and post radation with LN removed   CESAREAN SECTION     x 2   CHOLECYSTECTOMY     DILATION AND CURETTAGE OF UTERUS     LITHOTRIPSY  AROUND  2010   PORTACATH PLACEMENT Left 08/17/2019   Procedure: INSERTION PORT-A-CATH;  Surgeon: Sung Amabile, DO;  Location: [facility] ORS;  Service: General;  Laterality: Left;   ROBOTIC ASSISTED TOTAL HYSTERECTOMY WITH BILATERAL SALPINGO OOPHERECTOMY Bilateral 03/21/2021   Procedure: XI ROBOTIC ASSISTED TOTAL HYSTERECTOMY WITH BILATERAL SALPINGO OOPHORECTOMY;  Surgeon: Christeen Douglas, MD;  Location: [facility] ORS;  Service: Gynecology;  Laterality: Bilateral;   TUBAL LIGATION      I have reviewed the social history and family history with the patient and they are unchanged from previous note.  ALLERGIES:  has No Known Allergies.  MEDICATIONS:  Current Outpatient Medications  Medication Sig Dispense Refill   ALPRAZolam (XANAX) 0.5 MG tablet Take 1 tablet (0.5 mg total) by mouth 2 (two) times daily as needed for anxiety. 60 tablet 0   anastrozole (ARIMIDEX) 1 MG tablet Take 1 tablet (1 mg total) by mouth daily. 90 tablet 1   fluticasone (FLONASE) 50 MCG/ACT nasal spray SPRAY 1 SPRAY INTO BOTH NOSTRILS DAILY. 16 mL 2   Aspirin-Acetaminophen-Caffeine (GOODYS EXTRA STRENGTH) 500-325-65 MG PACK Take 1 packet by mouth daily as needed (pain). (Patient not taking: Reported on 12/30/2022)     No current facility-administered medications for this visit.    PHYSICAL EXAMINATION: ECOG PERFORMANCE STATUS: 1 - Symptomatic but completely ambulatory  Vitals:   03/31/23 1057  BP:  121/75  Pulse: 98  Resp: 18  Temp: 98.2 F (36.8 C)   Filed Weights   03/31/23 1057  Weight: 211 lb 3.2 oz (95.8 kg)   Physical Exam Constitutional:      General: She is not in acute distress.    Appearance: She is not diaphoretic.  HENT:     Head: Normocephalic and atraumatic.     Nose: Nose normal.     Mouth/Throat:     Pharynx: No oropharyngeal exudate.  Eyes:     General: No scleral icterus.    Pupils: Pupils are equal, round, and reactive to light.  Cardiovascular:     Rate and Rhythm: Normal rate and regular rhythm.     Heart sounds: No murmur heard. Pulmonary:     Effort: Pulmonary effort is normal. No respiratory distress.     Breath sounds: No rales.  Chest:     Chest wall: No tenderness.  Abdominal:     General: There is no distension.     Palpations: Abdomen is soft.     Tenderness: There is no abdominal tenderness.  Musculoskeletal:        General: Swelling present. Normal range of motion.     Cervical back: Normal range of motion and neck supple.     Right lower leg: Edema present.     Left lower leg: Edema present.  Skin:    General: Skin is warm and dry.     Findings: No erythema.  Neurological:     Mental Status: She is alert and oriented to person, place, and time.     Cranial Nerves: No cranial nerve deficit.     Motor: No abnormal muscle tone.     Coordination: Coordination normal.  Psychiatric:        Mood and Affect: Affect normal.      LABORATORY DATA:  I have reviewed the data as listed    Latest Ref Rng & Units 03/24/2023   10:41 AM 12/23/2022   11:36 AM 06/16/2022    1:46 PM  CBC  WBC 4.0 - 10.5 K/uL 6.1  7.7  7.2   Hemoglobin  12.0 - 15.0 g/dL 16.1  09.6  04.5   Hematocrit 36.0 - 46.0 % 38.0  41.8  40.6   Platelets 150 - 400 K/uL 235  306  286         Latest Ref Rng & Units 03/24/2023   10:41 AM 12/23/2022   11:36 AM 06/16/2022    1:46 PM  CMP  Glucose 70 - 99 mg/dL 409  95  811   BUN 6 - 20 mg/dL 13  10  12    Creatinine  0.44 - 1.00 mg/dL 9.14  7.82  9.56   Sodium 135 - 145 mmol/L 140  140  139   Potassium 3.5 - 5.1 mmol/L 3.6  3.6  3.7   Chloride 98 - 111 mmol/L 102  103  103   CO2 22 - 32 mmol/L 26  27  26    Calcium 8.9 - 10.3 mg/dL 9.2  9.2  9.1   Total Protein 6.5 - 8.1 g/dL 7.5  8.1  7.8   Total Bilirubin 0.3 - 1.2 mg/dL 0.4  0.5  0.8   Alkaline Phos 38 - 126 U/L 88  102  93   AST 15 - 41 U/L 22  19  23    ALT 0 - 44 U/L 18  18  21        RADIOGRAPHIC STUDIES: I have personally reviewed the radiological images as listed and agreed with the findings in the report. MR BREAST BILATERAL W WO CONTRAST INC CAD  Result Date: 01/14/2023 CLINICAL DATA:  History of a right lumpectomy for breast carcinoma performed on 02/01/2020. PET-CT dated 12/31/2022 demonstrated suspected right supraclavicular node metastases, equivocal left axillary node metastases and a small focus of hypermetabolism along the right subpectoral chest wall. EXAM: BILATERAL BREAST MRI WITH AND WITHOUT CONTRAST TECHNIQUE: Multiplanar, multisequence MR images of both breasts were obtained prior to and following the intravenous administration of 9 ml of Gadavist Three-dimensional MR images were rendered by post-processing of the original MR data on an independent workstation. The three-dimensional MR images were interpreted, and findings are reported in the following complete MRI report for this study. Three dimensional images were evaluated at the independent interpreting workstation using the DynaCAD thin client. COMPARISON:  Prior exams including PET CT dated 12/31/2022 and outside CT dated 11/06/2021. Prior breast MRI, 11/18/2021. FINDINGS: Breast composition: a. Almost entirely fat. Background parenchymal enhancement: Minimal Right breast: No mass or abnormal enhancement. Stable post lumpectomy changes in the inferior breast. Lumpectomy bed accounts for the abnormality noted on the outside CT scan. Left breast: No mass or abnormal enhancement. Lymph  nodes: No enlarged or abnormal appearing lymph nodes. Hypermetabolic nodes noted on the recent PET CT appear normal and unchanged on MRI. Ancillary findings: No chest wall mass or abnormal enhancement. No MRI abnormality to correspond to the small focal area of subpectoral hypermetabolism noted on the recent PET-CT. IMPRESSION: 1. No evidence of new or recurrent breast malignancy. 2. No breast MRI findings of metastatic disease on the included field of view. 3. Benign post lumpectomy changes on the right, stable. RECOMMENDATION: 1. Annual surveillance mammography. Last bilateral mammography study performed on 10/21/2022. 2. Consider repeat PET-CT in 4-6 months to reassess the areas of hypermetabolism noted on the current exam. BI-RADS CATEGORY  2: Benign. Electronically Signed   By: Amie Portland M.D.   On: 01/14/2023 08:34

## 2023-03-31 NOTE — Assessment & Plan Note (Addendum)
Invasive right breast cancer,  -S/p 4 cycles of neoadjuvant ddAC and weekly Taxol x 12. Pathology ypT1c ypN1a(sn) 04/30/2020  finished adjuvant radiation. Previously on ovarian suppresion with Zoledex previously, followed by bilateraltotal hysterectomy with bilateral salpingo oophorectomy.  Labs reviewed and discussed with patient. Continue Arimidex 1 mg daily Continue vitamin D supplementation. CA 15-3, CA 27-29 levels are stable.  01/01/2023, PET scan results were reviewed and discussed with patient.  I have also discussed with IR.  Supraclavicular lymph node was too small to be biopsied.  MRI breast did not reveal any subpectorally lesion recurrence or significant axillary lymphadenopathy   Repeat PET

## 2023-03-31 NOTE — Assessment & Plan Note (Signed)
Recommend Xanax 0.5 mg every 12 hours as needed for anxiety.  Rationale and potential side effects were reviewed and discussed with patient.

## 2023-03-31 NOTE — Assessment & Plan Note (Signed)
Stable

## 2023-04-26 ENCOUNTER — Other Ambulatory Visit: Payer: Self-pay

## 2023-04-26 DIAGNOSIS — Z170 Estrogen receptor positive status [ER+]: Secondary | ICD-10-CM

## 2023-04-26 DIAGNOSIS — C50311 Malignant neoplasm of lower-inner quadrant of right female breast: Secondary | ICD-10-CM

## 2023-04-28 ENCOUNTER — Other Ambulatory Visit: Payer: Medicaid Other

## 2023-04-28 ENCOUNTER — Ambulatory Visit: Payer: Medicaid Other

## 2023-05-03 ENCOUNTER — Ambulatory Visit: Payer: Medicaid Other | Admitting: Oncology

## 2023-05-03 ENCOUNTER — Other Ambulatory Visit: Payer: Medicaid Other

## 2023-05-03 ENCOUNTER — Ambulatory Visit
Admission: RE | Admit: 2023-05-03 | Discharge: 2023-05-03 | Disposition: A | Discharge status: Home / Self Care | Payer: Medicaid Other | Source: Ambulatory Visit | Attending: Oncology | Admitting: Oncology

## 2023-05-03 DIAGNOSIS — Z170 Estrogen receptor positive status [ER+]: Secondary | ICD-10-CM | POA: Diagnosis present

## 2023-05-03 DIAGNOSIS — C50311 Malignant neoplasm of lower-inner quadrant of right female breast: Secondary | ICD-10-CM | POA: Insufficient documentation

## 2023-05-03 MED ORDER — IOHEXOL 300 MG/ML  SOLN
100.0000 mL | Freq: Once | INTRAMUSCULAR | Status: AC | PRN
  Administered 2023-05-03: 100 mL via INTRAVENOUS

## 2023-05-11 ENCOUNTER — Encounter: Payer: Self-pay | Admitting: Oncology

## 2023-05-11 ENCOUNTER — Inpatient Hospital Stay: Payer: Medicaid Other | Attending: Oncology

## 2023-05-11 ENCOUNTER — Inpatient Hospital Stay: Payer: Medicaid Other

## 2023-05-11 ENCOUNTER — Inpatient Hospital Stay (HOSPITAL_BASED_OUTPATIENT_CLINIC_OR_DEPARTMENT_OTHER): Payer: Medicaid Other | Admitting: Oncology

## 2023-05-11 VITALS — BP 119/86 | HR 70 | Temp 96.9°F | Resp 18 | Wt 210.4 lb

## 2023-05-11 DIAGNOSIS — G620 Drug-induced polyneuropathy: Secondary | ICD-10-CM | POA: Insufficient documentation

## 2023-05-11 DIAGNOSIS — C7951 Secondary malignant neoplasm of bone: Secondary | ICD-10-CM | POA: Insufficient documentation

## 2023-05-11 DIAGNOSIS — Z170 Estrogen receptor positive status [ER+]: Secondary | ICD-10-CM

## 2023-05-11 DIAGNOSIS — R350 Frequency of micturition: Secondary | ICD-10-CM

## 2023-05-11 DIAGNOSIS — Z90710 Acquired absence of both cervix and uterus: Secondary | ICD-10-CM | POA: Diagnosis not present

## 2023-05-11 DIAGNOSIS — C50311 Malignant neoplasm of lower-inner quadrant of right female breast: Secondary | ICD-10-CM | POA: Insufficient documentation

## 2023-05-11 DIAGNOSIS — R3915 Urgency of urination: Secondary | ICD-10-CM

## 2023-05-11 DIAGNOSIS — Z90722 Acquired absence of ovaries, bilateral: Secondary | ICD-10-CM | POA: Diagnosis not present

## 2023-05-11 DIAGNOSIS — C801 Malignant (primary) neoplasm, unspecified: Secondary | ICD-10-CM

## 2023-05-11 DIAGNOSIS — Z79811 Long term (current) use of aromatase inhibitors: Secondary | ICD-10-CM | POA: Insufficient documentation

## 2023-05-11 DIAGNOSIS — T451X5A Adverse effect of antineoplastic and immunosuppressive drugs, initial encounter: Secondary | ICD-10-CM

## 2023-05-11 DIAGNOSIS — F411 Generalized anxiety disorder: Secondary | ICD-10-CM

## 2023-05-11 LAB — CBC WITH DIFFERENTIAL (CANCER CENTER ONLY)
Abs Immature Granulocytes: 0.11 10*3/uL — ABNORMAL HIGH (ref 0.00–0.07)
Basophils Absolute: 0.1 10*3/uL (ref 0.0–0.1)
Basophils Relative: 1 %
Eosinophils Absolute: 0.1 10*3/uL (ref 0.0–0.5)
Eosinophils Relative: 1 %
HCT: 41.1 % (ref 36.0–46.0)
Hemoglobin: 13.5 g/dL (ref 12.0–15.0)
Immature Granulocytes: 1 %
Lymphocytes Relative: 40 %
Lymphs Abs: 3.9 10*3/uL (ref 0.7–4.0)
MCH: 28.5 pg (ref 26.0–34.0)
MCHC: 32.8 g/dL (ref 30.0–36.0)
MCV: 86.9 fL (ref 80.0–100.0)
Monocytes Absolute: 0.6 10*3/uL (ref 0.1–1.0)
Monocytes Relative: 6 %
Neutro Abs: 5.1 10*3/uL (ref 1.7–7.7)
Neutrophils Relative %: 51 %
Platelet Count: 342 10*3/uL (ref 150–400)
RBC: 4.73 MIL/uL (ref 3.87–5.11)
RDW: 13.2 % (ref 11.5–15.5)
WBC Count: 9.9 10*3/uL (ref 4.0–10.5)
nRBC: 0 % (ref 0.0–0.2)

## 2023-05-11 LAB — URINALYSIS, COMPLETE (UACMP) WITH MICROSCOPIC
Bilirubin Urine: NEGATIVE
Glucose, UA: NEGATIVE mg/dL
Hgb urine dipstick: NEGATIVE
Ketones, ur: NEGATIVE mg/dL
Nitrite: NEGATIVE
Protein, ur: NEGATIVE mg/dL
Specific Gravity, Urine: 1.02 (ref 1.005–1.030)
pH: 6 (ref 5.0–8.0)

## 2023-05-11 LAB — CMP (CANCER CENTER ONLY)
ALT: 14 U/L (ref 0–44)
AST: 17 U/L (ref 15–41)
Albumin: 4.3 g/dL (ref 3.5–5.0)
Alkaline Phosphatase: 114 U/L (ref 38–126)
Anion gap: 10 (ref 5–15)
BUN: 16 mg/dL (ref 6–20)
CO2: 26 mmol/L (ref 22–32)
Calcium: 9.4 mg/dL (ref 8.9–10.3)
Chloride: 100 mmol/L (ref 98–111)
Creatinine: 0.71 mg/dL (ref 0.44–1.00)
GFR, Estimated: >60 mL/min (ref >60)
Glucose, Bld: 89 mg/dL (ref 70–99)
Potassium: 3.8 mmol/L (ref 3.5–5.1)
Sodium: 136 mmol/L (ref 135–145)
Total Bilirubin: 0.5 mg/dL (ref 0.3–1.2)
Total Protein: 8.4 g/dL — ABNORMAL HIGH (ref 6.5–8.1)

## 2023-05-11 MED ORDER — LORAZEPAM 0.5 MG PO TABS: 0.5000 mg | ORAL_TABLET | Freq: Two times a day (BID) | ORAL | 1 refills | Status: AC | PRN

## 2023-05-11 MED ORDER — ZOLEDRONIC ACID 4 MG/100ML IV SOLN
4.0000 mg | Freq: Once | INTRAVENOUS | Status: AC
  Administered 2023-05-11: 4 mg via INTRAVENOUS
  Filled 2023-05-11: qty 100

## 2023-05-11 MED ORDER — SODIUM CHLORIDE 0.9 % IV SOLN
Freq: Once | INTRAVENOUS | Status: AC
  Filled 2023-05-11: qty 250

## 2023-05-11 NOTE — Assessment & Plan Note (Addendum)
Bone health,05/26/2022 bone density-normal.  anterior T12, L2 without hypermetabolism.  Favoring treated metastasis.  Equivocal sclerotic metastasis involving the left iliac bone.    Recommend Zometa every 3 months.

## 2023-05-11 NOTE — Progress Notes (Signed)
Hematology/Oncology Progress note [phone] [phone]   CHIEF COMPLAINT: F/u of right breast cancer   ASSESSMENT & PLAN:   Malignant neoplasm of lower-inner quadrant of right breast of female, estrogen receptor positive (HCC)  Invasive right breast cancer,  -S/p 4 cycles of neoadjuvant ddAC and weekly Taxol x 12. Pathology ypT1c ypN1a(sn) 04/30/2020  finished adjuvant radiation. Previously on ovarian suppresion with Zoledex previously, followed by bilateraltotal hysterectomy with bilateral salpingo oophorectomy.  Labs reviewed and discussed with patient. Continue Arimidex 1 mg daily Continue vitamin D supplementation. CA 15-3, CA 27-29 levels are stable.  A follow-up PET scan was not approved by insurance.  CT scan showed stable right supraclavicular and subpectoral node size.  treated osseous metastasis. Not able to obtain biopsy of the small lymph nodes.  There were active on previous PET, suspicious for metastasis. I recommend to continue current regimen.  She is asymptomatic.  Close monitoring.  I plan to repeat a PET scan in 3 months.     Neuropathy due to chemotherapeutic drug (HCC) Stable.  Aromatase inhibitor use Bone health,05/26/2022 bone density-normal.  anterior T12, L2 without hypermetabolism.  Favoring treated metastasis.  Equivocal sclerotic metastasis involving the left iliac bone.    Recommend Zometa every 3 months.  Anxiety associated with cancer diagnosis (HCC) Xanax 0.5 mg every 12 hours as needed for anxiety.- not effective.  Switch to Ativan 0.5 every 12 hours as needed  Urinary urgency Check urine analysis and urine culture. Urine noted small leukocyte, negative nitrate.  Awaiting urine culture    Orders Placed This Encounter  Procedures   Urine Culture    Standing Status:   Future    Number of Occurrences:   1    Standing Expiration Date:   05/10/2024   NM PET Image Restage (PS) Skull Base to Thigh (F-18 FDG)    Standing  Status:   Future    Standing Expiration Date:   05/10/2024    Order Specific Question:   If indicated for the ordered procedure, I authorize the administration of a radiopharmaceutical per Radiology protocol    Answer:   Yes    Order Specific Question:   Preferred imaging location?    Answer:   [facility]    Order Specific Question:   Radiology Contrast Protocol - do NOT remove file path    Answer:   [email or web address]    Order Specific Question:   Is the patient pregnant?    Answer:   No   Urinalysis, Complete w Microscopic    Standing Status:   Future    Number of Occurrences:   1    Standing Expiration Date:   05/10/2024   CBC with Differential (Cancer Center Only)    Standing Status:   Future    Standing Expiration Date:   05/10/2024   CMP (Cancer Center only)    Standing Status:   Future    Standing Expiration Date:   05/10/2024   Cancer antigen 27.29    Standing Status:   Future    Standing Expiration Date:   05/10/2024   Cancer antigen 15-3    Standing Status:   Future    Standing Expiration Date:   05/10/2024   Follow-up after repeat PET scan. All questions were answered. The patient knows to call the clinic with any problems, questions or concerns.  Rickard Patience, MD, PhD [facility] Hematology Oncology 05/11/2023   SUMMARY OF ONCOLOGIC HISTORY: Oncology History Overview Note  Cancer Staging Malignant neoplasm of  lower-inner quadrant of right breast of female, estrogen receptor positive (HCC) Staging form: Breast, AJCC 8th Edition - Clinical stage from 07/26/2019: Stage IIB (cT2, cN1, cM0, G3, ER+, PR+, HER2-) - Unsigned    Malignant neoplasm of lower-inner quadrant of right breast of female, estrogen receptor positive (HCC)  07/20/2019 Mammogram   Mammogram 07/20/19  IMPRESSION: Suspicious palpable right breast mass 3.4 x 2.5 x 3.4 cm 5 o'clock position 3 cm from nipple.   Suspicious 1.6cm cortically thickened right axillary lymph  node.   07/26/2019 Initial Diagnosis   DIAGNOSIS: 07/26/19 A. RIGHT BREAST, 5:00, 3CMFN; ULTRASOUND-GUIDED NEEDLE CORE BIOPSY:  - INVASIVE MAMMARY CARCINOMA, NO SPECIAL TYPE.    07/26/2019 Receptors her2   BREAST BIOMARKER TESTS  Estrogen Receptor (ER) Status: POSITIVE                       Percentage of cells with nuclear positivity: 51-90%                       Average intensity of staining: Strong   Progesterone Receptor (PgR) Status: POSITIVE                       Percentage of cells with nuclear positivity:  Greater than 90%                       Average intensity of staining: Strong   HER2 (by immunohistochemistry): NEGATIVE (Score 1+)    08/05/2019 Initial Diagnosis   Invasive carcinoma of breast (HCC)   08/17/2019 Surgery   INSERTION PORT-A-CATH by Dr. Tonna Boehringer 08/17/19  Mediport was removed in June 2022   08/18/2019 - 01/01/2020 Chemotherapy   AC q2weeks for 4 cycles starting 08/18/19 followed by weekly Taxol for 12 weeks    11/13/2019 Genetic Testing   Negative genetic testing. No pathogenic variants identified on the [facility] Common Hereditary Cancers Panel. The report date is 11/13/2019.   The Common Hereditary Cancers Panel offered by [facility] includes sequencing and/or deletion duplication testing of the following 48 genes: APC, ATM, AXIN2, BARD1, BMPR1A, BRCA1, BRCA2, BRIP1, CDH1, CDKN2A (p14ARF), CDKN2A (p16INK4a), CKD4, CHEK2, CTNNA1, DICER1, EPCAM (Deletion/duplication testing only), GREM1 (promoter region deletion/duplication testing only), KIT, MEN1, MLH1, MSH2, MSH3, MSH6, MUTYH, NBN, NF1, NHTL1, PALB2, PDGFRA, PMS2, POLD1, POLE, PTEN, RAD50, RAD51C, RAD51D, RNF43, SDHB, SDHC, SDHD, SMAD4, SMARCA4. STK11, TP53, TSC1, TSC2, and VHL.  The following genes were evaluated for sequence changes only: SDHA and HOXB13 c.251G>A variant only.   02/01/2020 Surgery   s/p right lumpectomy and axillary lymph node dissection. ypT1c ypN1a(sn)  ER/PR positive, HER2 negative, residual  invasive mammary carcinoma, grade 3. 1/3 sentinel lymph node positive and 2/10 right axillary lymph nodes positive. DCIS present. Margin was negative for invasive carcinoma and DCIS    04/30/2020 -  Radiation Therapy   Finished adjuvant radiation    05/2020 -  Anti-estrogen oral therapy   started ovarian suppression with Zoladex monthly, started Arimidex 1 mg daily. 03/11/21 last dose of Zoledex   03/21/2021 Surgery   Surgical ovarian suppression via TAH-BSO   09/22/2021 Imaging   Bilateral diagnostic mammogram showed No evidence of malignancy within either breast. Stable postsurgical changes within the RIGHT breast.    11/19/2021 Imaging    bilateral MRI breast with and without contrast showed lumpectomy scar in the central to slightly inner right breast corresponds to the mass seen in the right breast on recent CT.  No MRI evidence of malignancy in either breast.   05/26/2022 Imaging   DEXA showed normal bone density   06/26/2022 Echocardiogram   LVEF 60-65%, Grade I diastolic dysfunction    10/21/2022 Imaging   Bilateral diagnostic mammogram showed no evidence of new or recurrent breast carcinoma.   01/01/2023 Imaging   PET scan showed 5 mm short axis right supraclavicular node (series 4/image 35), max SUV 3.9, suspicious for nodal metastasis. Vague hypermetabolism in the right axilla max SUV 3.2 (PET image 33), without corresponding node on, equivocal C,  additional areas of focal hypermetabolism in the right subpectoral region (PET images 38 and 39), max SUV 11.3, without well-defined nodes but raising concern for recurrence. Equivocal 5 mm short axis left axillary node (series 4/image 30), but max SUV 4.5, raising concern for contralateral nodal metastasis.  Focal hypermetabolism with sclerosis involving the left medial femoral head (series 4/image 133), favoring a vascular necrosis. Max SUV 5.8.   Additional 14 mm sclerotic metastasis involving the anterior T12 vertebral body  (series 4/image 32) and 3.5 cm sclerotic metastasis involving the left lamina at L2 (series 4/image 89), without hypermetabolism, favoring treated metastasis. Equivocal sclerotic metastasis involving the left iliac bone    01/13/2023 Imaging   MRI breast bilateral with and without contrast showed 1. No evidence of new or recurrent breast malignancy. 2. No breast MRI findings of metastatic disease on the included field of view. 3. Benign post lumpectomy changes on the right, stable   05/03/2023 Imaging   CT chest abdomen pelvis with contrast showed 1. Right supraclavicular and subpectoral nodes are similar and equivocally enlarged compared to 12/11/2022. These were hypermetabolic on prior PET, likely metastasis. 2. Primarily similar treated osseous metastasis. Progressive sclerosis in the femoral head favors healed metastasis over avascular necrosis. 3. Probable hepatic steatosis. 4. Small hiatal hernia.       #Neuropathy due to chemotherapy, pre-existing sciatica, symptoms are resolved, and she is not on any neuropathy medication.  History of right kidney stone, there was concern of calcium calculus.  She is off calcium    INTERVAL HISTORY:  Jessica [name] presents to follow-up for breast cancer. She takes Arimidex 1 mg daily she tolerates with manageable side effects. Patient has no new complaints.  She has no new breast concerns. + Numbness tingling of lower extremity.  Stable + Back pain and increased urinary urgency.  Review of Systems  Constitutional:  Positive for fatigue. Negative for appetite change, chills and fever.  HENT:   Negative for hearing loss and voice change.   Eyes:  Negative for eye problems.  Respiratory:  Negative for chest tightness and cough.   Cardiovascular:  Positive for leg swelling. Negative for chest pain.  Gastrointestinal:  Negative for abdominal distention, abdominal pain and blood in stool.  Endocrine: Positive for hot flashes.  Genitourinary:   Negative for difficulty urinating and frequency.   Musculoskeletal:  Negative for arthralgias.  Skin:  Negative for itching and rash.  Neurological:  Positive for numbness. Negative for extremity weakness.  Hematological:  Negative for adenopathy.  Psychiatric/Behavioral:  Negative for confusion and sleep disturbance.     MEDICAL HISTORY:  Past Medical History:  Diagnosis Date   Anemia    Arthritis    left ankle   Breast cancer (HCC) 07/23/2019   right breast- [facility]   Cancer (HCC)    breast cancer   Family history of cervical cancer    Family history of lung cancer    Frequency  GERD (gastroesophageal reflux disease)    History of kidney stones    h/o   Obesity    Personal history of chemotherapy    Personal history of radiation therapy    Right flank pain     SURGICAL HISTORY: Past Surgical History:  Procedure Laterality Date   AXILLARY LYMPH NODE DISSECTION Right 02/01/2020   Procedure: AXILLARY LYMPH NODE DISSECTION;  Surgeon: Sung Amabile, DO;  Location: [facility] ORS;  Service: General;  Laterality: Right;   BREAST BIOPSY Right 2020   [facility]   BREAST LUMPECTOMY Right 02/01/2020   pt has neo adj chemo and post radation with LN removed   CESAREAN SECTION     x 2   CHOLECYSTECTOMY     DILATION AND CURETTAGE OF UTERUS     LITHOTRIPSY  AROUND 2010   PORTACATH PLACEMENT Left 08/17/2019   Procedure: INSERTION PORT-A-CATH;  Surgeon: Sung Amabile, DO;  Location: [facility] ORS;  Service: General;  Laterality: Left;   ROBOTIC ASSISTED TOTAL HYSTERECTOMY WITH BILATERAL SALPINGO OOPHERECTOMY Bilateral 03/21/2021   Procedure: XI ROBOTIC ASSISTED TOTAL HYSTERECTOMY WITH BILATERAL SALPINGO OOPHORECTOMY;  Surgeon: Christeen Douglas, MD;  Location: [facility] ORS;  Service: Gynecology;  Laterality: Bilateral;   TUBAL LIGATION      I have reviewed the social history and family history with the patient and they are unchanged from previous note.  ALLERGIES:  has No Known Allergies.  MEDICATIONS:   Current Outpatient Medications  Medication Sig Dispense Refill   anastrozole (ARIMIDEX) 1 MG tablet Take 1 tablet (1 mg total) by mouth daily. 90 tablet 1   fluticasone (FLONASE) 50 MCG/ACT nasal spray SPRAY 1 SPRAY INTO BOTH NOSTRILS DAILY. 16 mL 2   LORazepam (ATIVAN) 0.5 MG tablet Take 1 tablet (0.5 mg total) by mouth every 12 (twelve) hours as needed for anxiety. 60 tablet 1   Aspirin-Acetaminophen-Caffeine (GOODYS EXTRA STRENGTH) 500-325-65 MG PACK Take 1 packet by mouth daily as needed (pain). (Patient not taking: Reported on 12/30/2022)     No current facility-administered medications for this visit.    PHYSICAL EXAMINATION: ECOG PERFORMANCE STATUS: 1 - Symptomatic but completely ambulatory  Vitals:   05/11/23 1443  BP: 119/86  Pulse: 70  Resp: 18  Temp: (!) 96.9 F (36.1 C)   Filed Weights   05/11/23 1443  Weight: 210 lb 6.4 oz (95.4 kg)   Physical Exam Constitutional:      General: She is not in acute distress.    Appearance: She is not diaphoretic.  HENT:     Head: Normocephalic and atraumatic.     Nose: Nose normal.     Mouth/Throat:     Pharynx: No oropharyngeal exudate.  Eyes:     General: No scleral icterus.    Pupils: Pupils are equal, round, and reactive to light.  Cardiovascular:     Rate and Rhythm: Normal rate and regular rhythm.     Heart sounds: No murmur heard. Pulmonary:     Effort: Pulmonary effort is normal. No respiratory distress.     Breath sounds: No rales.  Chest:     Chest wall: No tenderness.  Abdominal:     General: There is no distension.     Palpations: Abdomen is soft.     Tenderness: There is no abdominal tenderness.  Musculoskeletal:        General: Swelling present. Normal range of motion.     Cervical back: Normal range of motion and neck supple.     Right lower leg: Edema present.  Left lower leg: Edema present.  Skin:    General: Skin is warm and dry.     Findings: No erythema.  Neurological:     Mental Status:  She is alert and oriented to person, place, and time.     Cranial Nerves: No cranial nerve deficit.     Motor: No abnormal muscle tone.     Coordination: Coordination normal.  Psychiatric:        Mood and Affect: Affect normal.      LABORATORY DATA:  I have reviewed the data as listed    Latest Ref Rng & Units 05/11/2023    2:19 PM 03/24/2023   10:41 AM 12/23/2022   11:36 AM  CBC  WBC 4.0 - 10.5 K/uL 9.9  6.1  7.7   Hemoglobin 12.0 - 15.0 g/dL 40.3  47.4  25.9   Hematocrit 36.0 - 46.0 % 41.1  38.0  41.8   Platelets 150 - 400 K/uL 342  235  306         Latest Ref Rng & Units 05/11/2023    2:20 PM 03/24/2023   10:41 AM 12/23/2022   11:36 AM  CMP  Glucose 70 - 99 mg/dL 89  563  95   BUN 6 - 20 mg/dL 16  13  10    Creatinine 0.44 - 1.00 mg/dL 8.75  6.43  3.29   Sodium 135 - 145 mmol/L 136  140  140   Potassium 3.5 - 5.1 mmol/L 3.8  3.6  3.6   Chloride 98 - 111 mmol/L 100  102  103   CO2 22 - 32 mmol/L 26  26  27    Calcium 8.9 - 10.3 mg/dL 9.4  9.2  9.2   Total Protein 6.5 - 8.1 g/dL 8.4  7.5  8.1   Total Bilirubin 0.3 - 1.2 mg/dL 0.5  0.4  0.5   Alkaline Phos 38 - 126 U/L 114  88  102   AST 15 - 41 U/L 17  22  19    ALT 0 - 44 U/L 14  18  18        RADIOGRAPHIC STUDIES: I have personally reviewed the radiological images as listed and agreed with the findings in the report. CT CHEST ABDOMEN PELVIS W CONTRAST  Result Date: 05/05/2023 CLINICAL DATA:  Recurrent right breast cancer diagnosed in January with elevated tumor markers. Original diagnosis in 2020 with lumpectomy. Chemotherapy and radiation therapy. * Tracking Code: BO * EXAM: CT CHEST, ABDOMEN, AND PELVIS WITH CONTRAST TECHNIQUE: Multidetector CT imaging of the chest, abdomen and pelvis was performed following the standard protocol during bolus administration of intravenous contrast. RADIATION DOSE REDUCTION: This exam was performed according to the departmental dose-optimization program which includes automated exposure  control, adjustment of the mA and/or kV according to patient size and/or use of iterative reconstruction technique. CONTRAST:  OMNIPAQUE IOHEXOL 300 MG/ML  SOLN COMPARISON:  12/31/2022 PET. FINDINGS: CT CHEST FINDINGS Cardiovascular: Normal aortic caliber. Borderline cardiomegaly. No central pulmonary embolism, on this non-dedicated study. Mediastinum/Nodes: Right supraclavicular nodes again identified. The hypermetabolic node on the prior is similar at 4 mm on 05/02, not pathologic by size criteria. Right subpectoral node measures 1.0 cm on 10/02 and coronal image 68 versus 9 mm on the prior exam. This corresponds to hypermetabolism on PET. No axillary or left subpectoral adenopathy. No mediastinal or hilar adenopathy. Small hiatal hernia. No internal mammary adenopathy. Lungs/Pleura: No pleural fluid.  Clear lungs. Musculoskeletal: Sclerotic osseous metastasis. Example within the  T11 vertebral body, similar at 1.3 cm. CT ABDOMEN PELVIS FINDINGS Hepatobiliary: Cholecystectomy. Suspect mild hepatic steatosis. No focal liver lesion. No biliary duct dilatation. Pancreas: Normal, without mass or ductal dilatation. Spleen: Normal in size, without focal abnormality. Adrenals/Urinary Tract: Normal adrenal glands. Normal kidneys, without hydronephrosis. Normal urinary bladder. Stomach/Bowel: Normal remainder of the stomach. Normal colon and terminal ileum. Normal small bowel. Vascular/Lymphatic: Normal caliber of the aorta and branch vessels. No abdominopelvic adenopathy. Reproductive: Hysterectomy.  No adnexal mass. Other: No significant free fluid. No evidence of omental or peritoneal disease. Musculoskeletal: Left femoral sclerosis, more well-defined/conspicuous today including on 106/2. Other extensive sclerotic lesions, consistent with treated metastasis. Example left iliac at 8 mm on 84/2. Left pedicle at L2 measuring 3.2 cm on 62/2. IMPRESSION: 1. Right supraclavicular and subpectoral nodes are similar and  equivocally enlarged compared to 12/11/2022. These were hypermetabolic on prior PET, likely metastasis. 2. Primarily similar treated osseous metastasis. Progressive sclerosis in the femoral head favors healed metastasis over avascular necrosis. 3. Probable hepatic steatosis. 4. Small hiatal hernia. Electronically Signed   By: Jeronimo Greaves M.D.   On: 05/05/2023 11:25

## 2023-05-11 NOTE — Assessment & Plan Note (Addendum)
Xanax 0.5 mg every 12 hours as needed for anxiety.- not effective.  Switch to Ativan 0.5 every 12 hours as needed

## 2023-05-11 NOTE — Progress Notes (Signed)
Pt reports that she has been having cramps in her back. Pt also reports poor appetite for the past couple of days. She has been having frequent urination.

## 2023-05-11 NOTE — Assessment & Plan Note (Addendum)
Invasive right breast cancer,  -S/p 4 cycles of neoadjuvant ddAC and weekly Taxol x 12. Pathology ypT1c ypN1a(sn) 04/30/2020  finished adjuvant radiation. Previously on ovarian suppresion with Zoledex previously, followed by bilateraltotal hysterectomy with bilateral salpingo oophorectomy.  Labs reviewed and discussed with patient. Continue Arimidex 1 mg daily Continue vitamin D supplementation. CA 15-3, CA 27-29 levels are stable.  A follow-up PET scan was not approved by insurance.  CT scan showed stable right supraclavicular and subpectoral node size.  treated osseous metastasis. Not able to obtain biopsy of the small lymph nodes.  There were active on previous PET, suspicious for metastasis. I recommend to continue current regimen.  She is asymptomatic.  Close monitoring.  I plan to repeat a PET scan in 3 months.

## 2023-05-11 NOTE — Assessment & Plan Note (Addendum)
Stable

## 2023-05-11 NOTE — Assessment & Plan Note (Signed)
Check urine analysis and urine culture. Urine noted small leukocyte, negative nitrate.  Awaiting urine culture

## 2023-05-11 NOTE — Patient Instructions (Signed)

## 2023-05-12 ENCOUNTER — Encounter: Payer: Self-pay | Admitting: Oncology

## 2023-05-12 ENCOUNTER — Other Ambulatory Visit: Payer: Self-pay | Admitting: Oncology

## 2023-05-12 LAB — CANCER ANTIGEN 15-3: CA 15-3: 35.4 U/mL — ABNORMAL HIGH (ref 0.0–25.0)

## 2023-05-12 LAB — CANCER ANTIGEN 27.29: CA 27.29: 47.1 U/mL — ABNORMAL HIGH (ref 0.0–38.6)

## 2023-05-12 MED ORDER — CIPROFLOXACIN HCL 250 MG PO TABS: 250.0000 mg | ORAL_TABLET | Freq: Two times a day (BID) | ORAL | 0 refills | Status: AC

## 2023-05-13 LAB — URINE CULTURE: Culture: NO GROWTH

## 2023-05-18 ENCOUNTER — Emergency Department
Admission: EM | Admit: 2023-05-18 | Discharge: 2023-05-18 | Disposition: A | Discharge status: Home / Self Care | Payer: Medicaid Other | Attending: Emergency Medicine | Admitting: Emergency Medicine

## 2023-05-18 ENCOUNTER — Emergency Department: Payer: Medicaid Other

## 2023-05-18 ENCOUNTER — Other Ambulatory Visit: Payer: Self-pay

## 2023-05-18 DIAGNOSIS — D72829 Elevated white blood cell count, unspecified: Secondary | ICD-10-CM | POA: Diagnosis not present

## 2023-05-18 DIAGNOSIS — R109 Unspecified abdominal pain: Secondary | ICD-10-CM | POA: Diagnosis present

## 2023-05-18 DIAGNOSIS — C50911 Malignant neoplasm of unspecified site of right female breast: Secondary | ICD-10-CM | POA: Insufficient documentation

## 2023-05-18 DIAGNOSIS — N3000 Acute cystitis without hematuria: Secondary | ICD-10-CM | POA: Insufficient documentation

## 2023-05-18 LAB — HEPATIC FUNCTION PANEL
ALT: 15 U/L (ref 0–44)
AST: 16 U/L (ref 15–41)
Albumin: 4.1 g/dL (ref 3.5–5.0)
Alkaline Phosphatase: 97 U/L (ref 38–126)
Bilirubin, Direct: <0.1 mg/dL (ref 0.0–0.2)
Total Bilirubin: 0.8 mg/dL (ref 0.3–1.2)
Total Protein: 7.8 g/dL (ref 6.5–8.1)

## 2023-05-18 LAB — BASIC METABOLIC PANEL
Anion gap: 11 (ref 5–15)
BUN: 17 mg/dL (ref 6–20)
CO2: 23 mmol/L (ref 22–32)
Calcium: 9 mg/dL (ref 8.9–10.3)
Chloride: 101 mmol/L (ref 98–111)
Creatinine, Ser: 0.72 mg/dL (ref 0.44–1.00)
GFR, Estimated: >60 mL/min (ref >60)
Glucose, Bld: 94 mg/dL (ref 70–99)
Potassium: 3.6 mmol/L (ref 3.5–5.1)
Sodium: 135 mmol/L (ref 135–145)

## 2023-05-18 LAB — CBC
HCT: 41.2 % (ref 36.0–46.0)
Hemoglobin: 13.2 g/dL (ref 12.0–15.0)
MCH: 28 pg (ref 26.0–34.0)
MCHC: 32 g/dL (ref 30.0–36.0)
MCV: 87.5 fL (ref 80.0–100.0)
Platelets: 318 10*3/uL (ref 150–400)
RBC: 4.71 MIL/uL (ref 3.87–5.11)
RDW: 13.2 % (ref 11.5–15.5)
WBC: 10.9 10*3/uL — ABNORMAL HIGH (ref 4.0–10.5)
nRBC: 0 % (ref 0.0–0.2)

## 2023-05-18 LAB — URINALYSIS, ROUTINE W REFLEX MICROSCOPIC
Bilirubin Urine: NEGATIVE
Glucose, UA: NEGATIVE mg/dL
Hgb urine dipstick: NEGATIVE
Ketones, ur: NEGATIVE mg/dL
Nitrite: NEGATIVE
Protein, ur: NEGATIVE mg/dL
Specific Gravity, Urine: 1.02 (ref 1.005–1.030)
Squamous Epithelial / HPF: NONE SEEN /HPF (ref 0–5)
pH: 6 (ref 5.0–8.0)

## 2023-05-18 LAB — LIPASE, BLOOD: Lipase: 62 U/L — ABNORMAL HIGH (ref 11–51)

## 2023-05-18 MED ORDER — OXYCODONE-ACETAMINOPHEN 5-325 MG PO TABS
1.0000 | ORAL_TABLET | Freq: Once | ORAL | Status: AC
  Administered 2023-05-18: 1 via ORAL
  Filled 2023-05-18: qty 1

## 2023-05-18 MED ORDER — MORPHINE SULFATE (PF) 4 MG/ML IV SOLN
4.0000 mg | Freq: Once | INTRAVENOUS | Status: AC
  Administered 2023-05-18: 4 mg via INTRAVENOUS
  Filled 2023-05-18: qty 1

## 2023-05-18 MED ORDER — ONDANSETRON HCL 4 MG/2ML IJ SOLN
4.0000 mg | Freq: Once | INTRAMUSCULAR | Status: AC
  Administered 2023-05-18: 4 mg via INTRAVENOUS
  Filled 2023-05-18: qty 2

## 2023-05-18 MED ORDER — SODIUM CHLORIDE 0.9 % IV SOLN
1.0000 g | Freq: Once | INTRAVENOUS | Status: AC
  Administered 2023-05-18: 1 g via INTRAVENOUS
  Filled 2023-05-18: qty 10

## 2023-05-18 MED ORDER — IOHEXOL 300 MG/ML  SOLN
100.0000 mL | Freq: Once | INTRAMUSCULAR | Status: AC | PRN
  Administered 2023-05-18: 100 mL via INTRAVENOUS

## 2023-05-18 MED ORDER — ONDANSETRON 4 MG PO TBDP
4.0000 mg | ORAL_TABLET | Freq: Once | ORAL | Status: AC
  Administered 2023-05-18: 4 mg via ORAL
  Filled 2023-05-18: qty 1

## 2023-05-18 MED ORDER — OXYCODONE-ACETAMINOPHEN 5-325 MG PO TABS: 1.0000 | ORAL_TABLET | Freq: Four times a day (QID) | ORAL | 0 refills | Status: DC | PRN

## 2023-05-18 MED ORDER — CEPHALEXIN 500 MG PO CAPS: 500.0000 mg | ORAL_CAPSULE | Freq: Four times a day (QID) | ORAL | 0 refills | Status: AC

## 2023-05-18 NOTE — ED Notes (Signed)
See triage notes. Patient c/o back pain. Patient believes she has kidney stones.

## 2023-05-18 NOTE — ED Provider Notes (Signed)
[facility] Provider Note  Patient Contact: 6:06 PM (approximate)   History   Back Pain   HPI  Jessica [name] is a 47 y.o. female with a history of invasive right breast cancer status post radiation and neoadjuvant and Taxol therapy, presents to the emergency department with right-sided flank pain.  Patient states that her oncology therapies have failed and she has new right-sided breast cancer growth and "might have a spot on my spine".  Patient denies new falls or mechanisms of trauma.  She continues to be weightbearing.  No fever or chills at home.  No nausea, vomiting or diarrhea.  No chest pain, chest tightness, shortness of breath or cough.  No lower extremity swelling.     Physical Exam   Triage Vital Signs: ED Triage Vitals  Encounter Vitals Group     BP 05/18/23 1615 122/82     Systolic BP Percentile --      Diastolic BP Percentile --      Pulse Rate 05/18/23 1615 85     Resp 05/18/23 1615 18     Temp 05/18/23 1615 98.7 F (37.1 C)     Temp Source 05/18/23 1615 Oral     SpO2 05/18/23 1615 98 %     Weight 05/18/23 1616 210 lb 5.1 oz (95.4 kg)     Height 05/18/23 1616 5' 1.97" (1.574 m)     Head Circumference --      Peak Flow --      Pain Score 05/18/23 1616 10     Pain Loc --      Pain Education --      Exclude from Growth Chart --     Most recent vital signs: Vitals:   05/18/23 1615 05/18/23 2101  BP: 122/82 124/78  Pulse: 85 88  Resp: 18 18  Temp: 98.7 F (37.1 C)   SpO2: 98% 99%     General: Alert and in no acute distress. Eyes:  PERRL. EOMI. Head: No acute traumatic findings ENT:      Nose: No congestion/rhinnorhea.      Mouth/Throat: Mucous membranes are moist.  Neck: No stridor. No cervical spine tenderness to palpation. Cardiovascular:  Good peripheral perfusion Respiratory: Normal respiratory effort without tachypnea or retractions. Lungs CTAB. Good air entry to the bases with no decreased or absent breath  sounds. Gastrointestinal: Bowel sounds 4 quadrants. Soft and nontender to palpation. No guarding or rigidity. No palpable masses. No distention. No CVA tenderness. Musculoskeletal: Full range of motion to all extremities.  Neurologic:  No gross focal neurologic deficits are appreciated.  Skin:   No rash noted    ED Results / Procedures / Treatments   Labs (all labs ordered are listed, but only abnormal results are displayed) Labs Reviewed  URINALYSIS, ROUTINE W REFLEX MICROSCOPIC - Abnormal; Notable for the following components:      Result Value   Color, Urine YELLOW (*)    APPearance CLEAR (*)    Leukocytes,Ua SMALL (*)    Bacteria, UA RARE (*)    All other components within normal limits  CBC - Abnormal; Notable for the following components:   WBC 10.9 (*)    All other components within normal limits  LIPASE, BLOOD - Abnormal; Notable for the following components:   Lipase 62 (*)    All other components within normal limits  URINE CULTURE  BASIC METABOLIC PANEL  HEPATIC FUNCTION PANEL        RADIOLOGY  I  personally viewed and evaluated these images as part of my medical decision making, as well as reviewing the written report by the radiologist.  ED Provider Interpretation: Sclerotic metastasis at T11, L1 and L2.   PROCEDURES:  Critical Care performed: No  Procedures   MEDICATIONS ORDERED IN ED: Medications  cefTRIAXone (ROCEPHIN) 1 g in sodium chloride 0.9 % 100 mL IVPB (1 g Intravenous New Bag/Given 05/18/23 2205)  morphine (PF) 4 MG/ML injection 4 mg (4 mg Intravenous Given 05/18/23 2030)  ondansetron (ZOFRAN) injection 4 mg (4 mg Intravenous Given 05/18/23 2031)  iohexol (OMNIPAQUE) 300 MG/ML solution 100 mL (100 mLs Intravenous Contrast Given 05/18/23 2038)  oxyCODONE-acetaminophen (PERCOCET/ROXICET) 5-325 MG per tablet 1 tablet (1 tablet Oral Given 05/18/23 2205)  ondansetron (ZOFRAN-ODT) disintegrating tablet 4 mg (4 mg Oral Given 05/18/23 2206)      IMPRESSION / MDM / ASSESSMENT AND PLAN / ED COURSE  I reviewed the triage vital signs and the nursing notes.                              Assessment and plan Back pain 47 year old female presents to the emergency department with low back pain that started acutely today.  Vital signs are reassuring at triage.  On exam, patient was alert and nontoxic-appearing with no neurodeficits appreciated.   Patient had sclerotic metastases identified at T11, L1 and L2.  No other acute abnormality on CT abdomen pelvis.  Patient with very mild elevation in white blood cell count.  Urinalysis with 21-50 white blood cells but no other findings suggestive of UTI.  Hepatic function panel and lipase within reference range.  Discussed imaging results with patient and she is concerned about back pain and possibility of UTI.  I did cover patient for urinary tract infection with Rocephin and Keflex.  I did review patient's prior urine culture and explained to her that there was no growth.  I recommended follow-up with her oncologist and prescribed a short course of Percocet for pain.  Return precautions were given to return with new or worsening symptoms.  All patient questions were answered.   FINAL CLINICAL IMPRESSION(S) / ED DIAGNOSES   Final diagnoses:  Acute cystitis without hematuria     Rx / DC Orders   ED Discharge Orders          Ordered    cephALEXin (KEFLEX) 500 MG capsule  4 times daily        05/18/23 2155    oxyCODONE-acetaminophen (PERCOCET/ROXICET) 5-325 MG tablet  Every 6 hours PRN        05/18/23 2158             Note:  This document was prepared using Dragon voice recognition software and may include unintentional dictation errors.   Pia Mau Yaak, Cordelia Poche 05/18/23 2223    Jene Every, MD 05/22/23 [phone]

## 2023-05-18 NOTE — ED Triage Notes (Addendum)
Pt here with back pain. Pt thought she had a UTI but states the pain has increased over the past few hours. Pt states the pain is on both side but hurts worse on the right side. Pt having nausea but no diarrhea or vomiting. Pt is a cancer pt.

## 2023-05-18 NOTE — Discharge Instructions (Signed)
Take Keflex four times daily for the next seven days.

## 2023-05-20 LAB — URINE CULTURE: Culture: NO GROWTH

## 2023-06-14 ENCOUNTER — Other Ambulatory Visit: Payer: Self-pay | Admitting: Oncology

## 2023-06-15 ENCOUNTER — Encounter: Payer: Self-pay | Admitting: Oncology

## 2023-07-27 ENCOUNTER — Encounter: Payer: Self-pay | Admitting: Oncology

## 2023-08-04 ENCOUNTER — Encounter: Payer: Self-pay | Admitting: Oncology

## 2023-08-11 ENCOUNTER — Telehealth: Payer: Self-pay | Admitting: Oncology

## 2023-08-11 ENCOUNTER — Ambulatory Visit: Admission: RE | Admit: 2023-08-11 | Payer: BC Managed Care – PPO | Source: Ambulatory Visit

## 2023-08-11 NOTE — Telephone Encounter (Signed)
Received a secure chat that pt NO SHOWED PET today 10/9  Per [name], appts with MD will need to be r/s to after a PET   Left a vm with pt that her PET and lab/MD/infusion appts will need to be r/s

## 2023-08-16 ENCOUNTER — Other Ambulatory Visit: Payer: Medicaid Other

## 2023-08-19 ENCOUNTER — Ambulatory Visit: Payer: Medicaid Other | Admitting: Oncology

## 2023-08-19 ENCOUNTER — Ambulatory Visit: Payer: Medicaid Other

## 2023-08-30 ENCOUNTER — Telehealth: Payer: Self-pay | Admitting: Oncology

## 2023-08-30 NOTE — Telephone Encounter (Signed)
Per secure chat Jessica [name] [facility]) reached out and left vm for pt to r/s her missed PET.  Per [name], reach out to pt as she is a breast cancer f/u  I also left a vm for pt to r/s her appts

## 2023-09-22 ENCOUNTER — Encounter: Payer: Self-pay | Admitting: Oncology

## 2023-09-22 NOTE — Telephone Encounter (Signed)
Telephone call  

## 2023-11-07 ENCOUNTER — Emergency Department: Payer: BC Managed Care – PPO

## 2023-11-07 ENCOUNTER — Emergency Department
Admission: EM | Admit: 2023-11-07 | Discharge: 2023-11-07 | Disposition: A | Discharge status: Home / Self Care | Payer: BC Managed Care – PPO | Attending: Emergency Medicine | Admitting: Emergency Medicine

## 2023-11-07 ENCOUNTER — Other Ambulatory Visit: Payer: Self-pay

## 2023-11-07 DIAGNOSIS — C50919 Malignant neoplasm of unspecified site of unspecified female breast: Secondary | ICD-10-CM | POA: Insufficient documentation

## 2023-11-07 DIAGNOSIS — C7949 Secondary malignant neoplasm of other parts of nervous system: Secondary | ICD-10-CM | POA: Diagnosis not present

## 2023-11-07 DIAGNOSIS — M549 Dorsalgia, unspecified: Secondary | ICD-10-CM | POA: Diagnosis present

## 2023-11-07 DIAGNOSIS — G893 Neoplasm related pain (acute) (chronic): Secondary | ICD-10-CM | POA: Insufficient documentation

## 2023-11-07 DIAGNOSIS — C7951 Secondary malignant neoplasm of bone: Secondary | ICD-10-CM

## 2023-11-07 LAB — COMPREHENSIVE METABOLIC PANEL
ALT: 16 U/L (ref 0–44)
AST: 17 U/L (ref 15–41)
Albumin: 3.8 g/dL (ref 3.5–5.0)
Alkaline Phosphatase: 116 U/L (ref 38–126)
Anion gap: 13 (ref 5–15)
BUN: 15 mg/dL (ref 6–20)
CO2: 24 mmol/L (ref 22–32)
Calcium: 9 mg/dL (ref 8.9–10.3)
Chloride: 100 mmol/L (ref 98–111)
Creatinine, Ser: 0.73 mg/dL (ref 0.44–1.00)
GFR, Estimated: >60 mL/min (ref >60)
Glucose, Bld: 111 mg/dL — ABNORMAL HIGH (ref 70–99)
Potassium: 3.2 mmol/L — ABNORMAL LOW (ref 3.5–5.1)
Sodium: 137 mmol/L (ref 135–145)
Total Bilirubin: 0.5 mg/dL (ref 0.0–1.2)
Total Protein: 7.4 g/dL (ref 6.5–8.1)

## 2023-11-07 LAB — CBC WITH DIFFERENTIAL/PLATELET
Abs Immature Granulocytes: 0.03 10*3/uL (ref 0.00–0.07)
Basophils Absolute: 0.1 10*3/uL (ref 0.0–0.1)
Basophils Relative: 1 %
Eosinophils Absolute: 0.1 10*3/uL (ref 0.0–0.5)
Eosinophils Relative: 1 %
HCT: 36.1 % (ref 36.0–46.0)
Hemoglobin: 11.8 g/dL — ABNORMAL LOW (ref 12.0–15.0)
Immature Granulocytes: 0 %
Lymphocytes Relative: 30 %
Lymphs Abs: 2.7 10*3/uL (ref 0.7–4.0)
MCH: 28.9 pg (ref 26.0–34.0)
MCHC: 32.7 g/dL (ref 30.0–36.0)
MCV: 88.5 fL (ref 80.0–100.0)
Monocytes Absolute: 0.7 10*3/uL (ref 0.1–1.0)
Monocytes Relative: 8 %
Neutro Abs: 5.4 10*3/uL (ref 1.7–7.7)
Neutrophils Relative %: 60 %
Platelets: 316 10*3/uL (ref 150–400)
RBC: 4.08 MIL/uL (ref 3.87–5.11)
RDW: 13.3 % (ref 11.5–15.5)
WBC: 9 10*3/uL (ref 4.0–10.5)
nRBC: 0 % (ref 0.0–0.2)

## 2023-11-07 LAB — URINALYSIS, ROUTINE W REFLEX MICROSCOPIC
Bacteria, UA: NONE SEEN
Bilirubin Urine: NEGATIVE
Glucose, UA: NEGATIVE mg/dL
Hgb urine dipstick: NEGATIVE
Ketones, ur: NEGATIVE mg/dL
Nitrite: NEGATIVE
Protein, ur: NEGATIVE mg/dL
Specific Gravity, Urine: 1.024 (ref 1.005–1.030)
pH: 5 (ref 5.0–8.0)

## 2023-11-07 LAB — PROCALCITONIN: Procalcitonin: <0.1 ng/mL

## 2023-11-07 LAB — LACTIC ACID, PLASMA: Lactic Acid, Venous: 1 mmol/L (ref 0.5–1.9)

## 2023-11-07 MED ORDER — ONDANSETRON HCL 4 MG/2ML IJ SOLN
4.0000 mg | Freq: Once | INTRAMUSCULAR | Status: AC
  Administered 2023-11-07: 4 mg via INTRAVENOUS
  Filled 2023-11-07: qty 2

## 2023-11-07 MED ORDER — LACTATED RINGERS IV BOLUS
1000.0000 mL | Freq: Once | INTRAVENOUS | Status: AC
  Administered 2023-11-07: 1000 mL via INTRAVENOUS

## 2023-11-07 MED ORDER — HEPARIN SOD (PORK) LOCK FLUSH 10 UNIT/ML IV SOLN
50.0000 [IU] | Freq: Once | INTRAVENOUS | Status: AC
  Administered 2023-11-07: 50 [IU]
  Filled 2023-11-07: qty 5

## 2023-11-07 MED ORDER — OXYCODONE HCL 5 MG PO TABS
5.0000 mg | ORAL_TABLET | Freq: Once | ORAL | Status: AC
  Administered 2023-11-07: 5 mg via ORAL
  Filled 2023-11-07: qty 1

## 2023-11-07 MED ORDER — LIDOCAINE 4 % EX CREA: TOPICAL_CREAM | Freq: Once | CUTANEOUS | Status: DC

## 2023-11-07 MED ORDER — HEPARIN SOD (PORK) LOCK FLUSH 100 UNIT/ML IV SOLN: 500.0000 [IU] | Freq: Once | INTRAVENOUS | Status: DC

## 2023-11-07 MED ORDER — HYDROMORPHONE HCL 1 MG/ML IJ SOLN
1.0000 mg | Freq: Once | INTRAMUSCULAR | Status: AC
  Administered 2023-11-07: 1 mg via INTRAVENOUS
  Filled 2023-11-07: qty 1

## 2023-11-07 MED ORDER — OXYCODONE HCL 5 MG PO TABS: 5.0000 mg | ORAL_TABLET | Freq: Three times a day (TID) | ORAL | 0 refills | Status: DC | PRN

## 2023-11-07 MED ORDER — LIDOCAINE 4 % EX CREA
TOPICAL_CREAM | Freq: Once | CUTANEOUS | Status: AC
  Administered 2023-11-07: 1 via TOPICAL
  Filled 2023-11-07: qty 5

## 2023-11-07 MED ORDER — ACETAMINOPHEN 500 MG PO TABS
1000.0000 mg | ORAL_TABLET | Freq: Once | ORAL | Status: AC
  Administered 2023-11-07: 1000 mg via ORAL
  Filled 2023-11-07: qty 2

## 2023-11-07 NOTE — ED Triage Notes (Signed)
 Pt to ED via POV c/o lower back pain. Pt reports this started a few days ago but got worse today. Denies abd, urinary symptoms. Denies any known injury.

## 2023-11-07 NOTE — Discharge Instructions (Signed)
 Use Tylenol  for pain and fevers.  Up to 1000 mg per dose, up to 4 times per day.  Do not take more than 4000 mg of Tylenol /acetaminophen  within 24 hours..  Use naproxen/Aleve for anti-inflammatory pain relief. Use up to 500mg  every 12 hours. Do not take more frequently than this. Do not use other NSAIDs (ibuprofen , Advil ) while taking this medication. It is safe to take Tylenol  with this.   Oxycodone  as needed for more severe pain

## 2023-11-07 NOTE — ED Provider Notes (Signed)
 [facility] Provider Note    Event Date/Time   First MD Initiated Contact with Patient 11/07/23 [phone]     (approximate)   History   Back Pain   HPI  Jessica [name] is a 48 y.o. female who presents to the ED for evaluation of Back Pain   I review oncology clinic visit from 12/31.  Breast cancer with mets to the bone undergoing radiation and about to start chemo.  Mets to T11, L1 and L2, left femur, right sacrum.   Patient presents for evaluation of acutely worsening back pain.  She reports similar location and quality of pain but acutely worsening severity not covered by her home medications.  Denies any trauma, falls or injuries, fevers, dysuria or any symptoms beyond atraumatic worsening back pain.  Diagnosed with a viral URI earlier this week and had some temperatures 99's, almost 100.   Physical Exam   Triage Vital Signs: ED Triage Vitals  Encounter Vitals Group     BP 11/07/23 0011 118/84     Systolic BP Percentile --      Diastolic BP Percentile --      Pulse Rate 11/07/23 0011 (!) 125     Resp 11/07/23 0011 20     Temp 11/07/23 0011 99 F (37.2 C)     Temp Source 11/07/23 0011 Oral     SpO2 11/07/23 0011 100 %     Weight 11/07/23 0028 187 lb (84.8 kg)     Height --      Head Circumference --      Peak Flow --      Pain Score 11/07/23 0028 10     Pain Loc --      Pain Education --      Exclude from Growth Chart --     Most recent vital signs: Vitals:   11/07/23 0402 11/07/23 0430  BP: 125/89 111/77  Pulse: 97 100  Resp: 18 16  Temp:    SpO2: 99% 98%    General: Awake, no distress.  Seems uncomfortable.  With assistance, she is able to sit forward so I can examine her back.  Poorly localizing paraspinal bilateral lumbar tenderness, she gestures across her lumbar back as a source of pain.  No skin changes. CV:  Good peripheral perfusion.  Resp:  Normal effort.  Clear lungs Abd:  No distention.  Soft and benign MSK:  No  deformity noted.  Neuro:  No focal deficits appreciated. Other:     ED Results / Procedures / Treatments   Labs (all labs ordered are listed, but only abnormal results are displayed) Labs Reviewed  URINALYSIS, ROUTINE W REFLEX MICROSCOPIC - Abnormal; Notable for the following components:      Result Value   Color, Urine YELLOW (*)    APPearance HAZY (*)    Leukocytes,Ua SMALL (*)    All other components within normal limits  CBC WITH DIFFERENTIAL/PLATELET - Abnormal; Notable for the following components:   Hemoglobin 11.8 (*)    All other components within normal limits  COMPREHENSIVE METABOLIC PANEL - Abnormal; Notable for the following components:   Potassium 3.2 (*)    Glucose, Bld 111 (*)    All other components within normal limits  CULTURE, BLOOD (ROUTINE X 2)  CULTURE, BLOOD (ROUTINE X 2)  URINE CULTURE  LACTIC ACID, PLASMA  PROCALCITONIN    EKG Sinus tachycardia with a rate of 121 bpm.  Normal axis and intervals.  No clear signs  of acute ischemia.  RADIOLOGY CXR interpreted by me without evidence of acute cardiopulmonary pathology. CT thoracic and lumbar spine interpreted by me with metastases without fracture  Official radiology report(s): CT Thoracic Spine Wo Contrast Result Date: 11/07/2023 CLINICAL DATA:  Known Mets to T11 and L1/2. Acute worsening pain. Evaluation for pathologic fractures. EXAM: CT THORACIC AND LUMBAR SPINE WITHOUT CONTRAST TECHNIQUE: Multidetector CT imaging of the thoracic and lumbar spine was performed without contrast. Multiplanar CT image reconstructions were also generated. RADIATION DOSE REDUCTION: This exam was performed according to the departmental dose-optimization program which includes automated exposure control, adjustment of the mA and/or kV according to patient size and/or use of iterative reconstruction technique. COMPARISON:  CT 05/18/2023 FINDINGS: CT THORACIC SPINE FINDINGS Alignment: No traumatic listhesis. Vertebrae: New  sclerotic metastasis in T4 extending into the left posterior elements and in the inferior endplate of T10. Slightly increased size of the sclerotic metastasis in T11. No acute or pathologic fracture. Paraspinal and other soft tissues: No acute abnormality. Disc levels: Intervertebral disc space height is maintained. No significant spinal canal or neural foraminal narrowing. CT LUMBAR SPINE FINDINGS Segmentation: 5 lumbar type vertebrae. Alignment: No evidence malalignment. Vertebrae: Mixed lytic and sclerotic appearance of L2 compatible with progression of the L2 metastasis. Similar sclerosis in the posterior L2 vertebral body extending into the posterior elements. New tiny sclerotic focus in the posterosuperior endplate of L4 (series 8/image 34). New ill-defined sclerosis in the anterior L5 vertebral body. Similar sclerosis along the left lateral L1 vertebral body. No pathologic fracture. Paraspinal and other soft tissues: No acute abnormality. Disc levels: No significant spinal canal or neural foraminal narrowing. Intervertebral disc space height is maintained. IMPRESSION: 1. New metastases in the T4, T10, L4, and L5 vertebral bodies. Increased size of the metastases in T11 and L2. 2. No pathologic fracture. Electronically Signed   By: Norman Gatlin M.D.   On: 11/07/2023 03:35   CT Lumbar Spine Wo Contrast Result Date: 11/07/2023 CLINICAL DATA:  Known Mets to T11 and L1/2. Acute worsening pain. Evaluation for pathologic fractures. EXAM: CT THORACIC AND LUMBAR SPINE WITHOUT CONTRAST TECHNIQUE: Multidetector CT imaging of the thoracic and lumbar spine was performed without contrast. Multiplanar CT image reconstructions were also generated. RADIATION DOSE REDUCTION: This exam was performed according to the departmental dose-optimization program which includes automated exposure control, adjustment of the mA and/or kV according to patient size and/or use of iterative reconstruction technique. COMPARISON:  CT  05/18/2023 FINDINGS: CT THORACIC SPINE FINDINGS Alignment: No traumatic listhesis. Vertebrae: New sclerotic metastasis in T4 extending into the left posterior elements and in the inferior endplate of T10. Slightly increased size of the sclerotic metastasis in T11. No acute or pathologic fracture. Paraspinal and other soft tissues: No acute abnormality. Disc levels: Intervertebral disc space height is maintained. No significant spinal canal or neural foraminal narrowing. CT LUMBAR SPINE FINDINGS Segmentation: 5 lumbar type vertebrae. Alignment: No evidence malalignment. Vertebrae: Mixed lytic and sclerotic appearance of L2 compatible with progression of the L2 metastasis. Similar sclerosis in the posterior L2 vertebral body extending into the posterior elements. New tiny sclerotic focus in the posterosuperior endplate of L4 (series 8/image 34). New ill-defined sclerosis in the anterior L5 vertebral body. Similar sclerosis along the left lateral L1 vertebral body. No pathologic fracture. Paraspinal and other soft tissues: No acute abnormality. Disc levels: No significant spinal canal or neural foraminal narrowing. Intervertebral disc space height is maintained. IMPRESSION: 1. New metastases in the T4, T10, L4, and L5 vertebral bodies.  Increased size of the metastases in T11 and L2. 2. No pathologic fracture. Electronically Signed   By: Norman Gatlin M.D.   On: 11/07/2023 03:35   DG Chest Portable 1 View Result Date: 11/07/2023 CLINICAL DATA:  Cough, possible sepsis EXAM: PORTABLE CHEST 1 VIEW COMPARISON:  06/06/2020 FINDINGS: Cardiac shadow is within normal limits. Right chest wall port is noted. Lungs are well aerated bilaterally. No bony abnormality is seen. IMPRESSION: No active disease. Electronically Signed   By: Oneil Devonshire M.D.   On: 11/07/2023 01:33    PROCEDURES and INTERVENTIONS:  .1-3 Lead EKG Interpretation  Performed by: Claudene Rover, MD Authorized by: Claudene Rover, MD     Interpretation:  normal     ECG rate:  95   ECG rate assessment: normal     Rhythm: sinus rhythm     Ectopy: none     Conduction: normal     Medications  HYDROmorphone  (DILAUDID ) injection 1 mg (1 mg Intravenous Given 11/07/23 0113)  lidocaine  (LMX) 4 % cream (1 Application Topical Given 11/07/23 0147)  HYDROmorphone  (DILAUDID ) injection 1 mg (1 mg Intravenous Given 11/07/23 0303)  acetaminophen  (TYLENOL ) tablet 1,000 mg (1,000 mg Oral Given 11/07/23 0410)  oxyCODONE  (Oxy IR/ROXICODONE ) immediate release tablet 5 mg (5 mg Oral Given 11/07/23 0410)  ondansetron  (ZOFRAN ) injection 4 mg (4 mg Intravenous Given 11/07/23 0416)  lactated ringers  bolus 1,000 mL (1,000 mLs Intravenous New Bag/Given 11/07/23 0416)     IMPRESSION / MDM / ASSESSMENT AND PLAN / ED COURSE  I reviewed the triage vital signs and the nursing notes.  Differential diagnosis includes, but is not limited to, sepsis, UTI or pyelonephritis, PE, pathologic fractures, more mets  {Patient presents with symptoms of an acute illness or injury that is potentially life-threatening.  Patient presents with acute on chronic pain, likely related to more metastases to her spine, suitable for close outpatient follow-up with additional analgesia.  Presents with a borderline temperature of 99 and tachycardic.  She has no clear infectious etiology of her symptoms.  No particular symptoms, clear CXR and urine.  Negative procalcitonin and lactic acid.  Normal WBC.  CT imaging of her spine thoracolumbar demonstrates multiple new metastases, no pathologic fractures.  I suspect this is the etiology of her symptoms.  While we draw cultures, no antibiotics provided.  I considered admission for this patient but she is comfortable going home.  Discharged with opiate analgesia.  Discussed ED return precautions.  She has follow-up on Wednesday with her oncologist.  Clinical Course as of 11/07/23 0600  Sun Nov 07, 2023  0134 Reassessed.  Controlled pain.  She is appreciative [DS]   0256 Reassessed.  Pain coming back.  We discussed more pain meds and plan of care with some imaging.  She is agreeable [DS]  0403 Reassessed and discussed CT results and plan of care [DS]  0505 UA is noted.  Small leukocytes and 21-50 whites.  No bacteria.  She has no urinary symptoms.  Doubt cystitis, we will send for culture but abstain from antibiotics considering remainder of workup is quite reassuring without infectious features. [DS]    Clinical Course User Index [DS] Claudene Rover, MD     FINAL CLINICAL IMPRESSION(S) / ED DIAGNOSES   Final diagnoses:  Metastatic cancer to spine [facility])  Cancer associated pain     Rx / DC Orders   ED Discharge Orders          Ordered    oxyCODONE  (ROXICODONE ) 5 MG immediate  release tablet  Every 8 hours PRN        11/07/23 0529             Note:  This document was prepared using Dragon voice recognition software and may include unintentional dictation errors.   Claudene Rover, MD 11/07/23 0600

## 2023-11-08 LAB — URINE CULTURE: Culture: NO GROWTH

## 2023-11-12 LAB — CULTURE, BLOOD (ROUTINE X 2)
Culture: NO GROWTH
Culture: NO GROWTH
Special Requests: ADEQUATE

## 2024-02-04 ENCOUNTER — Other Ambulatory Visit: Payer: Self-pay

## 2024-02-04 ENCOUNTER — Emergency Department

## 2024-02-04 ENCOUNTER — Emergency Department
Admission: EM | Admit: 2024-02-04 | Discharge: 2024-02-04 | Disposition: A | Discharge status: Home / Self Care | Attending: Emergency Medicine | Admitting: Emergency Medicine

## 2024-02-04 DIAGNOSIS — M25552 Pain in left hip: Secondary | ICD-10-CM | POA: Diagnosis not present

## 2024-02-04 DIAGNOSIS — M79605 Pain in left leg: Secondary | ICD-10-CM | POA: Insufficient documentation

## 2024-02-04 DIAGNOSIS — R1032 Left lower quadrant pain: Secondary | ICD-10-CM | POA: Diagnosis not present

## 2024-02-04 DIAGNOSIS — Z853 Personal history of malignant neoplasm of breast: Secondary | ICD-10-CM | POA: Insufficient documentation

## 2024-02-04 MED ORDER — CEPHALEXIN 500 MG PO CAPS: 500.0000 mg | ORAL_CAPSULE | Freq: Three times a day (TID) | ORAL | 0 refills | Status: AC

## 2024-02-04 MED ORDER — OXYCODONE HCL 5 MG PO TABS: 5.0000 mg | ORAL_TABLET | Freq: Three times a day (TID) | ORAL | 0 refills | Status: DC | PRN

## 2024-02-04 MED ORDER — OXYCODONE-ACETAMINOPHEN 5-325 MG PO TABS
1.0000 | ORAL_TABLET | Freq: Once | ORAL | Status: AC
  Administered 2024-02-04: 1 via ORAL
  Filled 2024-02-04: qty 1

## 2024-02-04 NOTE — ED Triage Notes (Signed)
 Pt reports she has cancer that has spread to her bones, pt having left leg pain x5 days. Pt denies fall or injury. Pt reports pain is in upper leg. Pt is currently receiving chemo.

## 2024-02-04 NOTE — ED Provider Notes (Signed)
 [facility] Provider Note    Event Date/Time   First MD Initiated Contact with Patient 02/04/24 0209     (approximate)   History   Leg Pain   HPI Jessica [name] is a 48 y.o. female with history of malignant breast cancer with metastatic spread to the left femur presenting today for left leg pain.  Patient states over the past week she has had intermittent pain around her left hip and left groin.  She was recently in the hospital with decreased mobility.  She notices the pain when she initially stands or when she rotates her leg outwards.  Seems to get better when she walks.  Denies any new trauma.  She has chronic swelling to the left lower extremity but has not noted any acute changes recently.  No prior history of blood clots and not on blood thinners.     Physical Exam   Triage Vital Signs: ED Triage Vitals [02/04/24 0121]  Encounter Vitals Group     BP 124/82     Systolic BP Percentile      Diastolic BP Percentile      Pulse Rate (!) 110     Resp 18     Temp 99.7 F (37.6 C)     Temp Source Oral     SpO2 100 %     Weight 190 lb (86.2 kg)     Height 5\' 2"  (1.575 m)     Head Circumference      Peak Flow      Pain Score 8     Pain Loc      Pain Education      Exclude from Growth Chart     Most recent vital signs: Vitals:   02/04/24 0121  BP: 124/82  Pulse: (!) 110  Resp: 18  Temp: 99.7 F (37.6 C)  SpO2: 100%   I have reviewed the vital signs. General:  Awake, alert, no acute distress. Head:  Normocephalic, Atraumatic. EENT:  PERRL, EOMI, Oral mucosa pink and moist, Neck is supple. Cardiovascular: Regular rate, 2+ distal pulses. Respiratory:  Normal respiratory effort, symmetrical expansion, no distress.   Extremities:  No significant tenderness to palpation anywhere along the left lower extremity in particular at the left hip.  No significant edema or erythema present.  Does have slight pain when abducting her left hip around  her groin region where she feels a stretching sensation. Neuro:  Alert and oriented.  Interacting appropriately.   Skin:  Warm, dry, no rash.   Psych: Appropriate affect.    ED Results / Procedures / Treatments   Labs (all labs ordered are listed, but only abnormal results are displayed) Labs Reviewed - No data to display   EKG    RADIOLOGY Independently interpreted left hip x-ray as well as left lower extremity ultrasound with no acute findings   PROCEDURES:  Critical Care performed: No  Procedures   MEDICATIONS ORDERED IN ED: Medications  oxyCODONE-acetaminophen (PERCOCET/ROXICET) 5-325 MG per tablet 1 tablet (1 tablet Oral Given 02/04/24 0250)     IMPRESSION / MDM / ASSESSMENT AND PLAN / ED COURSE  I reviewed the triage vital signs and the nursing notes.                              Differential diagnosis includes, but is not limited to, DVT, pathologic fracture, worsening metastatic disease in the left femur, muscular strain  Patient's presentation is most consistent with acute complicated illness / injury requiring diagnostic workup.  Patient is a 48 year old female presenting today for left hip and left groin pain x 1 week.  No significant tenderness palpation anywhere and still able to ambulate on the leg.  Does have known metastatic spread of her cancer to the proximal left humerus will get x-ray to evaluate.  This shows no acute changes.  Separately has some chronic swelling and she is unsure if there has been any change but given recent hospitalization and not on blood thinners with active cancer, will get ultrasound to rule out DVT.  She was given Percocet for pain medicine.  X-ray of left hip negative for worsening metastatic disease or pathologic fracture.  Ultrasound showed no evidence of DVT.  Patient feeling better after medicine at this time.  Suspect more likely muscular strain versus exacerbation of the sclerotic disease at the site.  Will send home with  pain medicine.  Also, she had noted area of slight cellulitis in the right lower extremity for which they have started her on an oral antibiotic but she has lost it.  Will represcribe her this antibiotic.  No other concerning features to that site.  Clinical Course as of 02/04/24 0403  Fri Feb 04, 2024  0255 DG Hip Unilat W or Wo Pelvis 2-3 Views Left No new acute findings [DW]    Clinical Course User Index [DW] Janith Lima, MD     FINAL CLINICAL IMPRESSION(S) / ED DIAGNOSES   Final diagnoses:  Left hip pain     Rx / DC Orders   ED Discharge Orders          Ordered    oxyCODONE (ROXICODONE) 5 MG immediate release tablet  Every 8 hours PRN        02/04/24 0402    cephALEXin (KEFLEX) 500 MG capsule  3 times daily        02/04/24 0402             Note:  This document was prepared using Dragon voice recognition software and may include unintentional dictation errors.   Janith Lima, MD 02/04/24 [phone]

## 2024-02-04 NOTE — Discharge Instructions (Signed)
 This may be a muscular injury versus exacerbation of your known sclerotic disease to the left femur.  I have sent pain medication to your pharmacy as well as antibiotics to treat the spot of potential cellulitis in your right lower leg.  Follow-up with your primary team as planned.

## 2024-02-19 ENCOUNTER — Emergency Department
Admission: EM | Admit: 2024-02-19 | Discharge: 2024-02-19 | Disposition: A | Discharge status: Home / Self Care | Attending: Emergency Medicine | Admitting: Emergency Medicine

## 2024-02-19 ENCOUNTER — Emergency Department

## 2024-02-19 ENCOUNTER — Other Ambulatory Visit: Payer: Self-pay

## 2024-02-19 DIAGNOSIS — Z853 Personal history of malignant neoplasm of breast: Secondary | ICD-10-CM | POA: Diagnosis not present

## 2024-02-19 DIAGNOSIS — M79605 Pain in left leg: Secondary | ICD-10-CM | POA: Insufficient documentation

## 2024-02-19 DIAGNOSIS — G893 Neoplasm related pain (acute) (chronic): Secondary | ICD-10-CM | POA: Insufficient documentation

## 2024-02-19 DIAGNOSIS — C7951 Secondary malignant neoplasm of bone: Secondary | ICD-10-CM

## 2024-02-19 LAB — BASIC METABOLIC PANEL WITH GFR
Anion gap: 7 (ref 5–15)
BUN: 18 mg/dL (ref 6–20)
CO2: 23 mmol/L (ref 22–32)
Calcium: 8 mg/dL — ABNORMAL LOW (ref 8.9–10.3)
Chloride: 105 mmol/L (ref 98–111)
Creatinine, Ser: 0.64 mg/dL (ref 0.44–1.00)
GFR, Estimated: >60 mL/min (ref >60)
Glucose, Bld: 94 mg/dL (ref 70–99)
Potassium: 3.5 mmol/L (ref 3.5–5.1)
Sodium: 135 mmol/L (ref 135–145)

## 2024-02-19 LAB — CBC WITH DIFFERENTIAL/PLATELET
Abs Immature Granulocytes: 2.86 10*3/uL — ABNORMAL HIGH (ref 0.00–0.07)
Basophils Absolute: 0 10*3/uL (ref 0.0–0.1)
Basophils Relative: 0 %
Eosinophils Absolute: 0.1 10*3/uL (ref 0.0–0.5)
Eosinophils Relative: 0 %
HCT: 29.4 % — ABNORMAL LOW (ref 36.0–46.0)
Hemoglobin: 9.4 g/dL — ABNORMAL LOW (ref 12.0–15.0)
Immature Granulocytes: 6 %
Lymphocytes Relative: 6 %
Lymphs Abs: 3.2 10*3/uL (ref 0.7–4.0)
MCH: 29.9 pg (ref 26.0–34.0)
MCHC: 32 g/dL (ref 30.0–36.0)
MCV: 93.6 fL (ref 80.0–100.0)
Monocytes Absolute: 1 10*3/uL (ref 0.1–1.0)
Monocytes Relative: 2 %
Neutro Abs: 42.7 10*3/uL — ABNORMAL HIGH (ref 1.7–7.7)
Neutrophils Relative %: 86 %
Platelets: 128 10*3/uL — ABNORMAL LOW (ref 150–400)
RBC: 3.14 MIL/uL — ABNORMAL LOW (ref 3.87–5.11)
RDW: 17.3 % — ABNORMAL HIGH (ref 11.5–15.5)
Smear Review: NORMAL
WBC: 49.8 10*3/uL — ABNORMAL HIGH (ref 4.0–10.5)
nRBC: 0 % (ref 0.0–0.2)

## 2024-02-19 MED ORDER — SODIUM CHLORIDE 0.9 % IV SOLN: Freq: Once | INTRAVENOUS | Status: AC

## 2024-02-19 MED ORDER — OXYCODONE HCL 5 MG PO TABS: 5.0000 mg | ORAL_TABLET | Freq: Three times a day (TID) | ORAL | 0 refills | Status: AC | PRN

## 2024-02-19 MED ORDER — GABAPENTIN 300 MG PO CAPS
300.0000 mg | ORAL_CAPSULE | Freq: Once | ORAL | Status: AC
  Administered 2024-02-19: 300 mg via ORAL
  Filled 2024-02-19: qty 3

## 2024-02-19 MED ORDER — HEPARIN SOD (PORK) LOCK FLUSH 100 UNIT/ML IV SOLN
500.0000 [IU] | Freq: Once | INTRAVENOUS | Status: AC
  Administered 2024-02-19: 500 [IU] via INTRAVENOUS
  Filled 2024-02-19: qty 5

## 2024-02-19 MED ORDER — MORPHINE SULFATE (PF) 4 MG/ML IV SOLN
4.0000 mg | Freq: Once | INTRAVENOUS | Status: AC
  Administered 2024-02-19: 4 mg via INTRAVENOUS
  Filled 2024-02-19: qty 1

## 2024-02-19 MED ORDER — MORPHINE SULFATE ER 15 MG PO TBCR: 15.0000 mg | EXTENDED_RELEASE_TABLET | Freq: Two times a day (BID) | ORAL | 0 refills | Status: AC

## 2024-02-19 MED ORDER — MORPHINE SULFATE ER 15 MG PO TBCR
15.0000 mg | EXTENDED_RELEASE_TABLET | Freq: Once | ORAL | Status: AC
  Administered 2024-02-19: 15 mg via ORAL
  Filled 2024-02-19: qty 1

## 2024-02-19 MED ORDER — ONDANSETRON HCL 4 MG/2ML IJ SOLN
4.0000 mg | Freq: Once | INTRAMUSCULAR | Status: AC
  Administered 2024-02-19: 4 mg via INTRAVENOUS
  Filled 2024-02-19: qty 2

## 2024-02-19 MED ORDER — HYDROMORPHONE HCL 1 MG/ML IJ SOLN
1.0000 mg | Freq: Once | INTRAMUSCULAR | Status: AC
  Administered 2024-02-19: 1 mg via INTRAVENOUS
  Filled 2024-02-19: qty 1

## 2024-02-19 NOTE — ED Provider Notes (Signed)
-----------------------------------------   7:11 AM on 02/19/2024 -----------------------------------------  Blood pressure 110/68, pulse 81, temperature 99 F (37.2 C), temperature source Oral, resp. rate 18, height 5\' 2"  (1.575 m), weight 86.2 kg, SpO2 100%.  Assuming care from Dr. Hendrick Locke.  In short, Jessica [name] is a 48 y.o. female with a chief complaint of Leg Pain .  Refer to the original H&P for additional details.  The current plan of care is to follow-up lab results and reassess for pain control.  ----------------------------------------- 8:13 AM on 02/19/2024 ----------------------------------------- Pain improved following additional IV pain medication, lab results discussed with Dr. Adrian Alba of oncology, who agrees that significant leukocytosis is expected given patient has been administering colony-stimulating factor the past couple of days.  For pain control, he recommends starting patient on MS Contin  in addition to her short acting oxycodone .  Refills provided of oxycodone  and new prescription provided for MS Contin .  Patient given initial dose of MS Contin  here in the ED and is appropriate for discharge home with follow-up with her oncology team at [facility].  She was counseled to return to the ED for new or worsening symptoms, patient agrees with plan.       Twilla Galea, MD 02/19/24 [phone]

## 2024-02-19 NOTE — ED Provider Notes (Signed)
 [facility] Provider Note    Event Date/Time   First MD Initiated Contact with Patient 02/19/24 [phone]     (approximate)   History   Leg Pain   HPI  Jessica [name] is a 48 y.o. female who presents to the emergency department today with severe leg pain.  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  She does have pain from this.  She states that when she gets her infusions to help with her blood counts it does make the pain worse.  She took these injections yesterday and the day before.  While she has had pain in that leg before tonight the pain was more severe and worsened.  She tried her home oxycodone  without any relief.  She denies any fevers.  Denies any trauma.     Physical Exam   Triage Vital Signs: ED Triage Vitals  Encounter Vitals Group     BP 02/19/24 0517 105/64     Systolic BP Percentile --      Diastolic BP Percentile --      Pulse Rate 02/19/24 0517 (!) 101     Resp 02/19/24 0517 18     Temp 02/19/24 0517 99 F (37.2 C)     Temp Source 02/19/24 0517 Oral     SpO2 02/19/24 0517 96 %     Weight 02/19/24 0514 190 lb (86.2 kg)     Height 02/19/24 0514 5\' 2"  (1.575 m)     Head Circumference --      Peak Flow --      Pain Score 02/19/24 0532 10     Pain Loc --      Pain Education --      Exclude from Growth Chart --     Most recent vital signs: Vitals:   02/19/24 0517  BP: 105/64  Pulse: (!) 101  Resp: 18  Temp: 99 F (37.2 C)  SpO2: 96%   General: Awake, alert, oriented. CV:  Good peripheral perfusion. Regular rate and rhythm. Resp:  Normal effort. Lungs clear. Abd:  No distention.  Other:  Left leg with slight swelling. NV intact distally.   ED Results / Procedures / Treatments   Labs (all labs ordered are listed, but only abnormal results are displayed) Labs pending at time of sign out   EKG  None   RADIOLOGY I independently interpreted and visualized the left  femur. My interpretation: No fracture Radiology interpretation:  IMPRESSION:  1. Heterogeneous sclerotic metastasis in the intertrochanteric  proximal left femur.  2. Patchy sclerosis in the left femoral head which could be a  combination of avascular necrosis and metastatic disease, or could  all be due to metastasis.  3. No pathologic fracture or other new abnormality is seen.  Unchanged since 02/04/2024.      PROCEDURES:  Critical Care performed: No   MEDICATIONS ORDERED IN ED: Medications - No data to display   IMPRESSION / MDM / ASSESSMENT AND PLAN / ED COURSE  I reviewed the triage vital signs and the nursing notes.                              Differential diagnosis includes, but is not limited to, pathological fracture, metastatic disease, infection  Patient's presentation is most consistent with acute presentation with potential threat to life or bodily function.   Patient presents to the  emergency department today because of concerns for severe left leg pain.  Patient does have a history of metastatic disease.  Denies any trauma.  On exam there is some swelling there per chart review she did have ultrasound performed earlier this month which did not show a DVT.  Will obtain x-ray to evaluate for possible pathologic fracture.  Will give pain medication and check blood work including CBC with differential.  X-ray without any pathologic fracture. Blood work with leukocytosis, awaiting diff at time of signout, however no obvious infectious source.       FINAL CLINICAL IMPRESSION(S) / ED DIAGNOSES   Final diagnoses:  Left leg pain      Note:  This document was prepared using Dragon voice recognition software and may include unintentional dictation errors.    Marylynn Soho, MD 02/19/24 [phone]

## 2024-02-19 NOTE — ED Notes (Signed)
 Verbal ok given by physician to access port

## 2024-02-19 NOTE — ED Triage Notes (Signed)
 Patient C/O left lower extremity pain that has worsened over the last day. Patient has been diagnosed with metastatic breast cancer with mets to the left femur. Patient takes oxycodone  at home for pain, which she last had around 0200 this morning with no relief.
